# Patient Record
Sex: Female | Born: 1945
Health system: Southern US, Community
[De-identification: ages and names within clinical notes are randomized; demographics above are authoritative.]

## PROBLEM LIST (undated history)

## (undated) DIAGNOSIS — F32A Depression, unspecified: Secondary | ICD-10-CM

## (undated) DIAGNOSIS — E785 Hyperlipidemia, unspecified: Secondary | ICD-10-CM

## (undated) DIAGNOSIS — C4491 Basal cell carcinoma of skin, unspecified: Secondary | ICD-10-CM

## (undated) DIAGNOSIS — E119 Type 2 diabetes mellitus without complications: Secondary | ICD-10-CM

## (undated) DIAGNOSIS — F329 Major depressive disorder, single episode, unspecified: Secondary | ICD-10-CM

## (undated) DIAGNOSIS — C4492 Squamous cell carcinoma of skin, unspecified: Secondary | ICD-10-CM

## (undated) DIAGNOSIS — C439 Malignant melanoma of skin, unspecified: Secondary | ICD-10-CM

## (undated) DIAGNOSIS — C801 Malignant (primary) neoplasm, unspecified: Secondary | ICD-10-CM

## (undated) DIAGNOSIS — F419 Anxiety disorder, unspecified: Secondary | ICD-10-CM

## (undated) DIAGNOSIS — I10 Essential (primary) hypertension: Secondary | ICD-10-CM

## (undated) HISTORY — PX: MELANOMA EXCISION: SHX5266

## (undated) HISTORY — PX: BREAST BIOPSY: SHX20

## (undated) HISTORY — PX: APPENDECTOMY: SHX54

## (undated) HISTORY — DX: Major depressive disorder, single episode, unspecified: F32.9

## (undated) HISTORY — DX: Basal cell carcinoma of skin, unspecified: C44.91

## (undated) HISTORY — DX: Anxiety disorder, unspecified: F41.9

## (undated) HISTORY — DX: Type 2 diabetes mellitus without complications: E11.9

## (undated) HISTORY — DX: Essential (primary) hypertension: I10

## (undated) HISTORY — DX: Depression, unspecified: F32.A

## (undated) HISTORY — PX: BASAL CELL CARCINOMA EXCISION: SHX1214

## (undated) HISTORY — DX: Malignant melanoma of skin, unspecified: C43.9

## (undated) HISTORY — PX: ABDOMINAL HYSTERECTOMY: SHX81

## (undated) HISTORY — DX: Squamous cell carcinoma of skin, unspecified: C44.92

## (undated) HISTORY — DX: Hyperlipidemia, unspecified: E78.5

---

## 1974-10-11 HISTORY — PX: PARTIAL HYSTERECTOMY: SHX80

## 1978-10-11 HISTORY — PX: BREAST BIOPSY: SHX20

## 1991-10-12 DIAGNOSIS — I219 Acute myocardial infarction, unspecified: Secondary | ICD-10-CM

## 1991-10-12 HISTORY — DX: Acute myocardial infarction, unspecified: I21.9

## 1994-10-11 HISTORY — PX: CORONARY ARTERY BYPASS GRAFT: SHX141

## 2006-05-25 ENCOUNTER — Emergency Department: Payer: Self-pay | Admitting: Emergency Medicine

## 2007-07-11 ENCOUNTER — Ambulatory Visit: Payer: Self-pay | Admitting: Internal Medicine

## 2007-09-16 ENCOUNTER — Ambulatory Visit: Payer: Self-pay | Admitting: Internal Medicine

## 2007-09-16 ENCOUNTER — Emergency Department: Payer: Self-pay | Admitting: Emergency Medicine

## 2007-09-21 ENCOUNTER — Ambulatory Visit: Payer: Self-pay | Admitting: Internal Medicine

## 2007-09-23 ENCOUNTER — Emergency Department: Payer: Self-pay | Admitting: Emergency Medicine

## 2008-01-12 DIAGNOSIS — C4491 Basal cell carcinoma of skin, unspecified: Secondary | ICD-10-CM

## 2008-01-12 HISTORY — DX: Basal cell carcinoma of skin, unspecified: C44.91

## 2011-12-10 DIAGNOSIS — E042 Nontoxic multinodular goiter: Secondary | ICD-10-CM | POA: Diagnosis not present

## 2011-12-10 DIAGNOSIS — E119 Type 2 diabetes mellitus without complications: Secondary | ICD-10-CM | POA: Diagnosis not present

## 2011-12-10 DIAGNOSIS — I10 Essential (primary) hypertension: Secondary | ICD-10-CM | POA: Diagnosis not present

## 2011-12-10 DIAGNOSIS — E0520 Thyrotoxicosis with toxic multinodular goiter without thyrotoxic crisis or storm: Secondary | ICD-10-CM | POA: Diagnosis not present

## 2011-12-13 DIAGNOSIS — C801 Malignant (primary) neoplasm, unspecified: Secondary | ICD-10-CM

## 2011-12-13 DIAGNOSIS — D485 Neoplasm of uncertain behavior of skin: Secondary | ICD-10-CM | POA: Diagnosis not present

## 2011-12-13 DIAGNOSIS — L820 Inflamed seborrheic keratosis: Secondary | ICD-10-CM | POA: Diagnosis not present

## 2011-12-13 DIAGNOSIS — L821 Other seborrheic keratosis: Secondary | ICD-10-CM | POA: Diagnosis not present

## 2011-12-13 DIAGNOSIS — D239 Other benign neoplasm of skin, unspecified: Secondary | ICD-10-CM | POA: Diagnosis not present

## 2011-12-13 DIAGNOSIS — C439 Malignant melanoma of skin, unspecified: Secondary | ICD-10-CM

## 2011-12-13 DIAGNOSIS — Z719 Counseling, unspecified: Secondary | ICD-10-CM | POA: Diagnosis not present

## 2011-12-13 HISTORY — DX: Malignant (primary) neoplasm, unspecified: C80.1

## 2011-12-13 HISTORY — DX: Malignant melanoma of skin, unspecified: C43.9

## 2011-12-20 DIAGNOSIS — C44390 Other specified malignant neoplasm of skin of unspecified parts of face: Secondary | ICD-10-CM | POA: Diagnosis not present

## 2011-12-27 DIAGNOSIS — C4310 Malignant melanoma of unspecified eyelid, including canthus: Secondary | ICD-10-CM | POA: Diagnosis not present

## 2011-12-28 DIAGNOSIS — C4310 Malignant melanoma of unspecified eyelid, including canthus: Secondary | ICD-10-CM | POA: Diagnosis not present

## 2012-01-04 DIAGNOSIS — E119 Type 2 diabetes mellitus without complications: Secondary | ICD-10-CM | POA: Diagnosis not present

## 2012-01-04 DIAGNOSIS — I2510 Atherosclerotic heart disease of native coronary artery without angina pectoris: Secondary | ICD-10-CM | POA: Diagnosis not present

## 2012-01-04 DIAGNOSIS — Z01810 Encounter for preprocedural cardiovascular examination: Secondary | ICD-10-CM | POA: Diagnosis not present

## 2012-01-04 DIAGNOSIS — I10 Essential (primary) hypertension: Secondary | ICD-10-CM | POA: Diagnosis not present

## 2012-01-04 DIAGNOSIS — Z01818 Encounter for other preprocedural examination: Secondary | ICD-10-CM | POA: Diagnosis not present

## 2012-01-04 DIAGNOSIS — C4330 Malignant melanoma of unspecified part of face: Secondary | ICD-10-CM | POA: Diagnosis not present

## 2012-01-10 DIAGNOSIS — K219 Gastro-esophageal reflux disease without esophagitis: Secondary | ICD-10-CM | POA: Diagnosis not present

## 2012-01-10 DIAGNOSIS — E0590 Thyrotoxicosis, unspecified without thyrotoxic crisis or storm: Secondary | ICD-10-CM | POA: Diagnosis not present

## 2012-01-10 DIAGNOSIS — E785 Hyperlipidemia, unspecified: Secondary | ICD-10-CM | POA: Diagnosis not present

## 2012-01-10 DIAGNOSIS — Z950 Presence of cardiac pacemaker: Secondary | ICD-10-CM | POA: Diagnosis not present

## 2012-01-10 DIAGNOSIS — I252 Old myocardial infarction: Secondary | ICD-10-CM | POA: Diagnosis not present

## 2012-01-10 DIAGNOSIS — I10 Essential (primary) hypertension: Secondary | ICD-10-CM | POA: Diagnosis not present

## 2012-01-10 DIAGNOSIS — F3289 Other specified depressive episodes: Secondary | ICD-10-CM | POA: Diagnosis not present

## 2012-01-10 DIAGNOSIS — Z7982 Long term (current) use of aspirin: Secondary | ICD-10-CM | POA: Diagnosis not present

## 2012-01-10 DIAGNOSIS — C4310 Malignant melanoma of unspecified eyelid, including canthus: Secondary | ICD-10-CM | POA: Diagnosis not present

## 2012-01-10 DIAGNOSIS — C4330 Malignant melanoma of unspecified part of face: Secondary | ICD-10-CM | POA: Diagnosis not present

## 2012-01-10 DIAGNOSIS — E119 Type 2 diabetes mellitus without complications: Secondary | ICD-10-CM | POA: Diagnosis not present

## 2012-01-10 DIAGNOSIS — Z79899 Other long term (current) drug therapy: Secondary | ICD-10-CM | POA: Diagnosis not present

## 2012-01-11 DIAGNOSIS — C4310 Malignant melanoma of unspecified eyelid, including canthus: Secondary | ICD-10-CM | POA: Diagnosis not present

## 2012-01-20 DIAGNOSIS — C4310 Malignant melanoma of unspecified eyelid, including canthus: Secondary | ICD-10-CM | POA: Diagnosis not present

## 2012-01-31 DIAGNOSIS — C4310 Malignant melanoma of unspecified eyelid, including canthus: Secondary | ICD-10-CM | POA: Diagnosis not present

## 2012-01-31 DIAGNOSIS — E119 Type 2 diabetes mellitus without complications: Secondary | ICD-10-CM | POA: Diagnosis not present

## 2012-01-31 DIAGNOSIS — E785 Hyperlipidemia, unspecified: Secondary | ICD-10-CM | POA: Diagnosis not present

## 2012-01-31 DIAGNOSIS — I10 Essential (primary) hypertension: Secondary | ICD-10-CM | POA: Diagnosis not present

## 2012-01-31 DIAGNOSIS — I2510 Atherosclerotic heart disease of native coronary artery without angina pectoris: Secondary | ICD-10-CM | POA: Diagnosis not present

## 2012-02-01 DIAGNOSIS — C4491 Basal cell carcinoma of skin, unspecified: Secondary | ICD-10-CM | POA: Diagnosis not present

## 2012-02-09 DIAGNOSIS — C44390 Other specified malignant neoplasm of skin of unspecified parts of face: Secondary | ICD-10-CM | POA: Diagnosis not present

## 2012-02-10 DIAGNOSIS — M952 Other acquired deformity of head: Secondary | ICD-10-CM | POA: Diagnosis not present

## 2012-02-10 DIAGNOSIS — Z951 Presence of aortocoronary bypass graft: Secondary | ICD-10-CM | POA: Diagnosis not present

## 2012-02-10 DIAGNOSIS — C4310 Malignant melanoma of unspecified eyelid, including canthus: Secondary | ICD-10-CM | POA: Diagnosis not present

## 2012-02-10 DIAGNOSIS — Z9889 Other specified postprocedural states: Secondary | ICD-10-CM | POA: Diagnosis not present

## 2012-02-10 DIAGNOSIS — E119 Type 2 diabetes mellitus without complications: Secondary | ICD-10-CM | POA: Diagnosis not present

## 2012-02-10 DIAGNOSIS — F329 Major depressive disorder, single episode, unspecified: Secondary | ICD-10-CM | POA: Diagnosis not present

## 2012-02-10 DIAGNOSIS — C4330 Malignant melanoma of unspecified part of face: Secondary | ICD-10-CM | POA: Diagnosis not present

## 2012-02-10 DIAGNOSIS — C44111 Basal cell carcinoma of skin of unspecified eyelid, including canthus: Secondary | ICD-10-CM | POA: Diagnosis not present

## 2012-02-10 DIAGNOSIS — I2510 Atherosclerotic heart disease of native coronary artery without angina pectoris: Secondary | ICD-10-CM | POA: Diagnosis not present

## 2012-02-10 DIAGNOSIS — I10 Essential (primary) hypertension: Secondary | ICD-10-CM | POA: Diagnosis not present

## 2012-02-10 DIAGNOSIS — E785 Hyperlipidemia, unspecified: Secondary | ICD-10-CM | POA: Diagnosis not present

## 2012-03-10 DIAGNOSIS — E1139 Type 2 diabetes mellitus with other diabetic ophthalmic complication: Secondary | ICD-10-CM | POA: Diagnosis not present

## 2012-03-10 DIAGNOSIS — E11329 Type 2 diabetes mellitus with mild nonproliferative diabetic retinopathy without macular edema: Secondary | ICD-10-CM | POA: Diagnosis not present

## 2012-03-10 DIAGNOSIS — H2510 Age-related nuclear cataract, unspecified eye: Secondary | ICD-10-CM | POA: Diagnosis not present

## 2012-03-10 DIAGNOSIS — E119 Type 2 diabetes mellitus without complications: Secondary | ICD-10-CM | POA: Diagnosis not present

## 2012-03-20 DIAGNOSIS — Z86010 Personal history of colonic polyps: Secondary | ICD-10-CM | POA: Diagnosis not present

## 2012-03-20 DIAGNOSIS — D126 Benign neoplasm of colon, unspecified: Secondary | ICD-10-CM | POA: Diagnosis not present

## 2012-03-20 DIAGNOSIS — Z1211 Encounter for screening for malignant neoplasm of colon: Secondary | ICD-10-CM | POA: Diagnosis not present

## 2012-03-20 DIAGNOSIS — D128 Benign neoplasm of rectum: Secondary | ICD-10-CM | POA: Diagnosis not present

## 2012-05-22 DIAGNOSIS — L821 Other seborrheic keratosis: Secondary | ICD-10-CM | POA: Diagnosis not present

## 2012-05-22 DIAGNOSIS — D1801 Hemangioma of skin and subcutaneous tissue: Secondary | ICD-10-CM | POA: Diagnosis not present

## 2012-05-22 DIAGNOSIS — D2370 Other benign neoplasm of skin of unspecified lower limb, including hip: Secondary | ICD-10-CM | POA: Diagnosis not present

## 2012-05-22 DIAGNOSIS — D485 Neoplasm of uncertain behavior of skin: Secondary | ICD-10-CM | POA: Diagnosis not present

## 2012-06-06 DIAGNOSIS — C4491 Basal cell carcinoma of skin, unspecified: Secondary | ICD-10-CM | POA: Diagnosis not present

## 2012-09-10 LAB — HM COLONOSCOPY

## 2013-01-03 ENCOUNTER — Ambulatory Visit: Payer: Self-pay | Admitting: Internal Medicine

## 2013-01-03 DIAGNOSIS — J329 Chronic sinusitis, unspecified: Secondary | ICD-10-CM | POA: Diagnosis not present

## 2013-01-09 DIAGNOSIS — E039 Hypothyroidism, unspecified: Secondary | ICD-10-CM | POA: Diagnosis not present

## 2013-01-09 DIAGNOSIS — E7800 Pure hypercholesterolemia, unspecified: Secondary | ICD-10-CM | POA: Diagnosis not present

## 2013-01-09 DIAGNOSIS — Z79899 Other long term (current) drug therapy: Secondary | ICD-10-CM | POA: Diagnosis not present

## 2013-01-09 DIAGNOSIS — I10 Essential (primary) hypertension: Secondary | ICD-10-CM | POA: Diagnosis not present

## 2013-01-09 DIAGNOSIS — E119 Type 2 diabetes mellitus without complications: Secondary | ICD-10-CM | POA: Diagnosis not present

## 2013-01-09 DIAGNOSIS — C439 Malignant melanoma of skin, unspecified: Secondary | ICD-10-CM | POA: Diagnosis not present

## 2013-01-09 DIAGNOSIS — Z794 Long term (current) use of insulin: Secondary | ICD-10-CM | POA: Diagnosis not present

## 2013-01-12 DIAGNOSIS — F4321 Adjustment disorder with depressed mood: Secondary | ICD-10-CM | POA: Diagnosis not present

## 2013-01-18 DIAGNOSIS — F4321 Adjustment disorder with depressed mood: Secondary | ICD-10-CM | POA: Diagnosis not present

## 2013-01-24 DIAGNOSIS — F4321 Adjustment disorder with depressed mood: Secondary | ICD-10-CM | POA: Diagnosis not present

## 2013-01-31 DIAGNOSIS — F4321 Adjustment disorder with depressed mood: Secondary | ICD-10-CM | POA: Diagnosis not present

## 2013-02-07 DIAGNOSIS — E119 Type 2 diabetes mellitus without complications: Secondary | ICD-10-CM | POA: Diagnosis not present

## 2013-02-07 DIAGNOSIS — E039 Hypothyroidism, unspecified: Secondary | ICD-10-CM | POA: Diagnosis not present

## 2013-02-07 DIAGNOSIS — F4321 Adjustment disorder with depressed mood: Secondary | ICD-10-CM | POA: Diagnosis not present

## 2013-02-07 DIAGNOSIS — I10 Essential (primary) hypertension: Secondary | ICD-10-CM | POA: Diagnosis not present

## 2013-02-07 DIAGNOSIS — I498 Other specified cardiac arrhythmias: Secondary | ICD-10-CM | POA: Diagnosis not present

## 2013-02-28 DIAGNOSIS — F4321 Adjustment disorder with depressed mood: Secondary | ICD-10-CM | POA: Diagnosis not present

## 2013-03-07 DIAGNOSIS — F4321 Adjustment disorder with depressed mood: Secondary | ICD-10-CM | POA: Diagnosis not present

## 2013-03-07 DIAGNOSIS — M545 Low back pain: Secondary | ICD-10-CM | POA: Diagnosis not present

## 2013-03-07 DIAGNOSIS — I10 Essential (primary) hypertension: Secondary | ICD-10-CM | POA: Diagnosis not present

## 2013-03-07 DIAGNOSIS — M533 Sacrococcygeal disorders, not elsewhere classified: Secondary | ICD-10-CM | POA: Diagnosis not present

## 2013-03-07 DIAGNOSIS — I25810 Atherosclerosis of coronary artery bypass graft(s) without angina pectoris: Secondary | ICD-10-CM | POA: Diagnosis not present

## 2013-03-14 DIAGNOSIS — F4321 Adjustment disorder with depressed mood: Secondary | ICD-10-CM | POA: Diagnosis not present

## 2013-03-21 DIAGNOSIS — F4321 Adjustment disorder with depressed mood: Secondary | ICD-10-CM | POA: Diagnosis not present

## 2013-04-04 DIAGNOSIS — R0602 Shortness of breath: Secondary | ICD-10-CM | POA: Diagnosis not present

## 2013-04-04 DIAGNOSIS — E782 Mixed hyperlipidemia: Secondary | ICD-10-CM | POA: Diagnosis not present

## 2013-04-04 DIAGNOSIS — I252 Old myocardial infarction: Secondary | ICD-10-CM | POA: Diagnosis not present

## 2013-04-04 DIAGNOSIS — I209 Angina pectoris, unspecified: Secondary | ICD-10-CM | POA: Diagnosis not present

## 2013-04-05 DIAGNOSIS — F4321 Adjustment disorder with depressed mood: Secondary | ICD-10-CM | POA: Diagnosis not present

## 2013-04-18 DIAGNOSIS — F4321 Adjustment disorder with depressed mood: Secondary | ICD-10-CM | POA: Diagnosis not present

## 2013-04-20 DIAGNOSIS — R0602 Shortness of breath: Secondary | ICD-10-CM | POA: Diagnosis not present

## 2013-04-20 DIAGNOSIS — I6529 Occlusion and stenosis of unspecified carotid artery: Secondary | ICD-10-CM | POA: Diagnosis not present

## 2013-04-20 DIAGNOSIS — I25810 Atherosclerosis of coronary artery bypass graft(s) without angina pectoris: Secondary | ICD-10-CM | POA: Diagnosis not present

## 2013-04-20 DIAGNOSIS — I209 Angina pectoris, unspecified: Secondary | ICD-10-CM | POA: Diagnosis not present

## 2013-04-23 DIAGNOSIS — I25810 Atherosclerosis of coronary artery bypass graft(s) without angina pectoris: Secondary | ICD-10-CM | POA: Diagnosis not present

## 2013-04-23 DIAGNOSIS — Z85828 Personal history of other malignant neoplasm of skin: Secondary | ICD-10-CM | POA: Diagnosis not present

## 2013-04-23 DIAGNOSIS — L570 Actinic keratosis: Secondary | ICD-10-CM | POA: Diagnosis not present

## 2013-04-23 DIAGNOSIS — Z1283 Encounter for screening for malignant neoplasm of skin: Secondary | ICD-10-CM | POA: Diagnosis not present

## 2013-04-23 DIAGNOSIS — M62838 Other muscle spasm: Secondary | ICD-10-CM | POA: Diagnosis not present

## 2013-04-23 DIAGNOSIS — D2370 Other benign neoplasm of skin of unspecified lower limb, including hip: Secondary | ICD-10-CM | POA: Diagnosis not present

## 2013-04-23 DIAGNOSIS — Z85820 Personal history of malignant melanoma of skin: Secondary | ICD-10-CM | POA: Diagnosis not present

## 2013-04-23 DIAGNOSIS — L821 Other seborrheic keratosis: Secondary | ICD-10-CM | POA: Diagnosis not present

## 2013-05-02 DIAGNOSIS — F4321 Adjustment disorder with depressed mood: Secondary | ICD-10-CM | POA: Diagnosis not present

## 2013-05-17 ENCOUNTER — Ambulatory Visit: Payer: Self-pay | Admitting: Internal Medicine

## 2013-05-17 DIAGNOSIS — R928 Other abnormal and inconclusive findings on diagnostic imaging of breast: Secondary | ICD-10-CM | POA: Diagnosis not present

## 2013-05-17 DIAGNOSIS — Z1231 Encounter for screening mammogram for malignant neoplasm of breast: Secondary | ICD-10-CM | POA: Diagnosis not present

## 2013-05-22 ENCOUNTER — Ambulatory Visit: Payer: Self-pay

## 2013-05-22 DIAGNOSIS — Z951 Presence of aortocoronary bypass graft: Secondary | ICD-10-CM | POA: Diagnosis not present

## 2013-05-22 DIAGNOSIS — S61409A Unspecified open wound of unspecified hand, initial encounter: Secondary | ICD-10-CM | POA: Diagnosis not present

## 2013-05-22 DIAGNOSIS — E119 Type 2 diabetes mellitus without complications: Secondary | ICD-10-CM | POA: Diagnosis not present

## 2013-05-22 DIAGNOSIS — I252 Old myocardial infarction: Secondary | ICD-10-CM | POA: Diagnosis not present

## 2013-08-28 DIAGNOSIS — J40 Bronchitis, not specified as acute or chronic: Secondary | ICD-10-CM | POA: Diagnosis not present

## 2013-08-28 DIAGNOSIS — F329 Major depressive disorder, single episode, unspecified: Secondary | ICD-10-CM | POA: Diagnosis not present

## 2013-08-28 DIAGNOSIS — I10 Essential (primary) hypertension: Secondary | ICD-10-CM | POA: Diagnosis not present

## 2013-08-28 DIAGNOSIS — E119 Type 2 diabetes mellitus without complications: Secondary | ICD-10-CM | POA: Diagnosis not present

## 2013-10-29 DIAGNOSIS — Z1283 Encounter for screening for malignant neoplasm of skin: Secondary | ICD-10-CM | POA: Diagnosis not present

## 2013-10-29 DIAGNOSIS — L570 Actinic keratosis: Secondary | ICD-10-CM | POA: Diagnosis not present

## 2013-10-29 DIAGNOSIS — Z85820 Personal history of malignant melanoma of skin: Secondary | ICD-10-CM | POA: Diagnosis not present

## 2013-10-29 DIAGNOSIS — D2370 Other benign neoplasm of skin of unspecified lower limb, including hip: Secondary | ICD-10-CM | POA: Diagnosis not present

## 2013-12-16 IMAGING — MG MM DIGITAL SCREENING BILAT W/ CAD
1 series · 4 of 4 positions shown · non-contrast
Comparison: none

REASON FOR EXAM: SCR MAMMO NO ORDER
COMMENTS:

PROCEDURE:     MMM - MMM DGT SCR NO ORDER W/CAD  - May 17, 2013  [DATE]
RESULT:     Comparison made to prior exams dating to 09/08/2009. No mass.
Benign calcifications. CAD evaluation nonfocal.

[R CC · right · 4 of 4 slices shown]
[im 1/4]
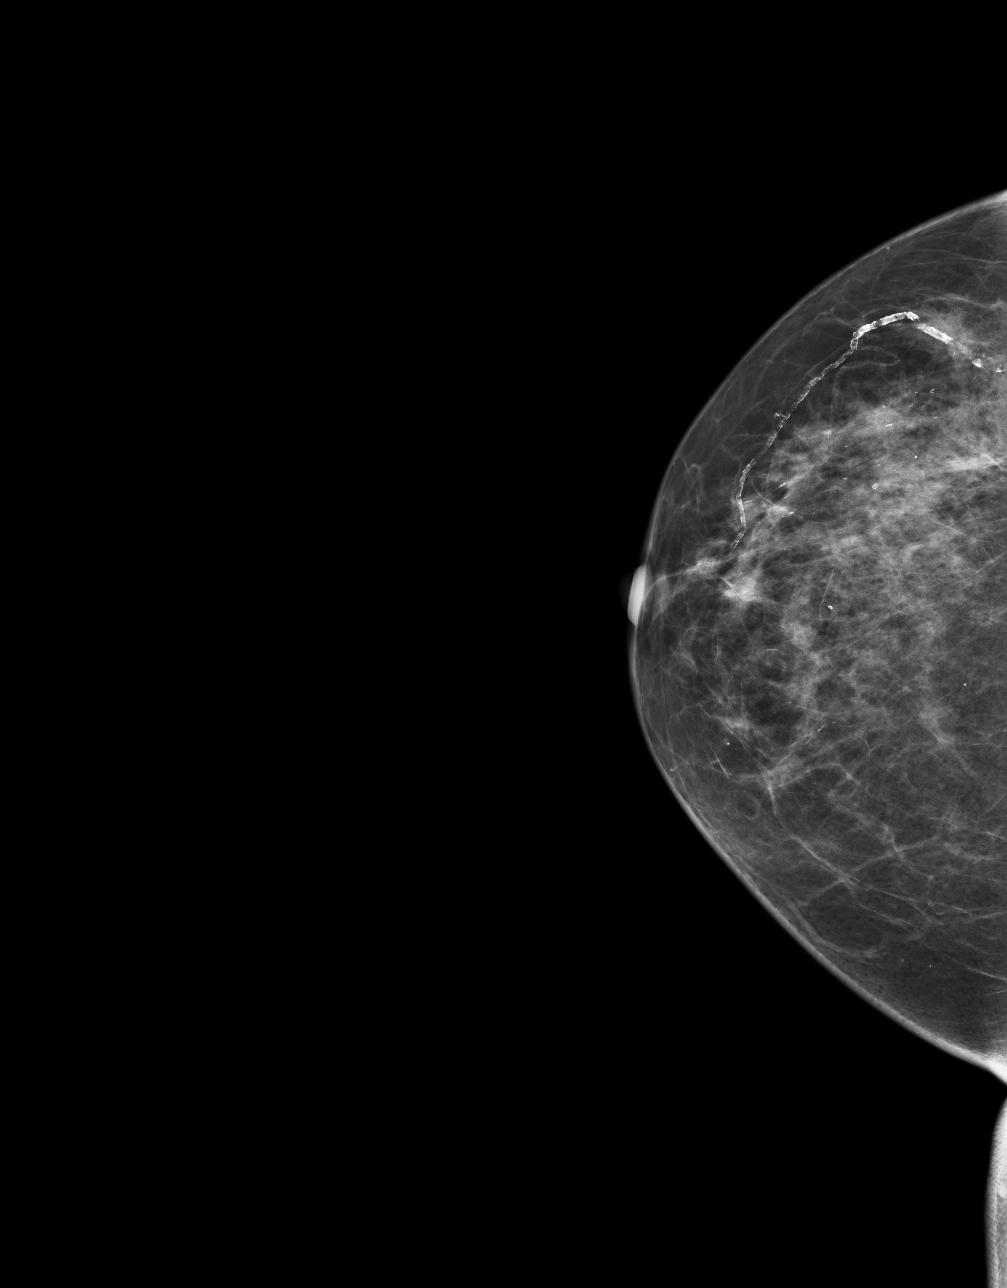
[im 2/4]
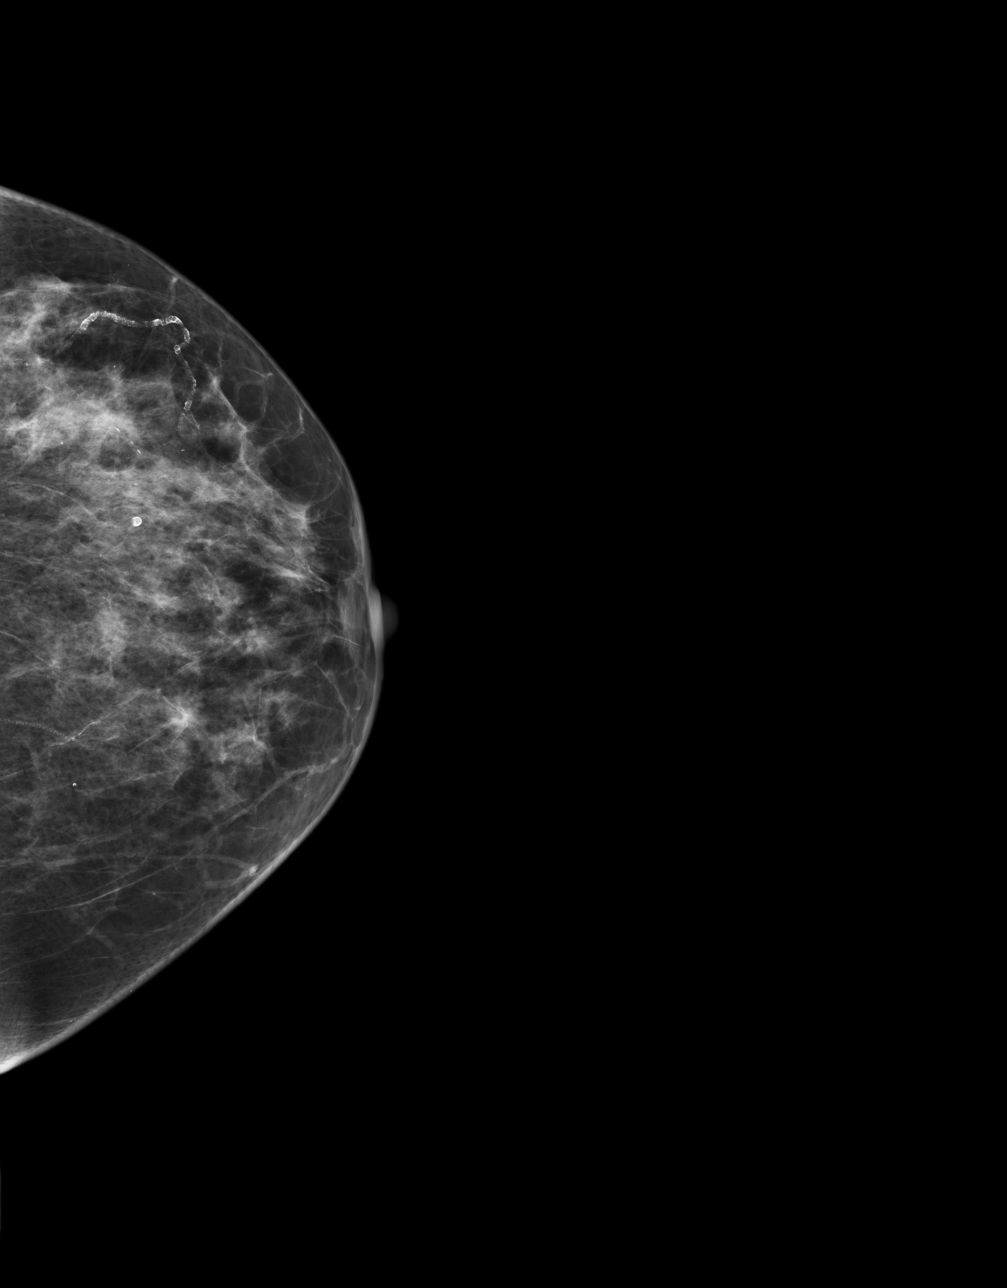
[im 3/4]
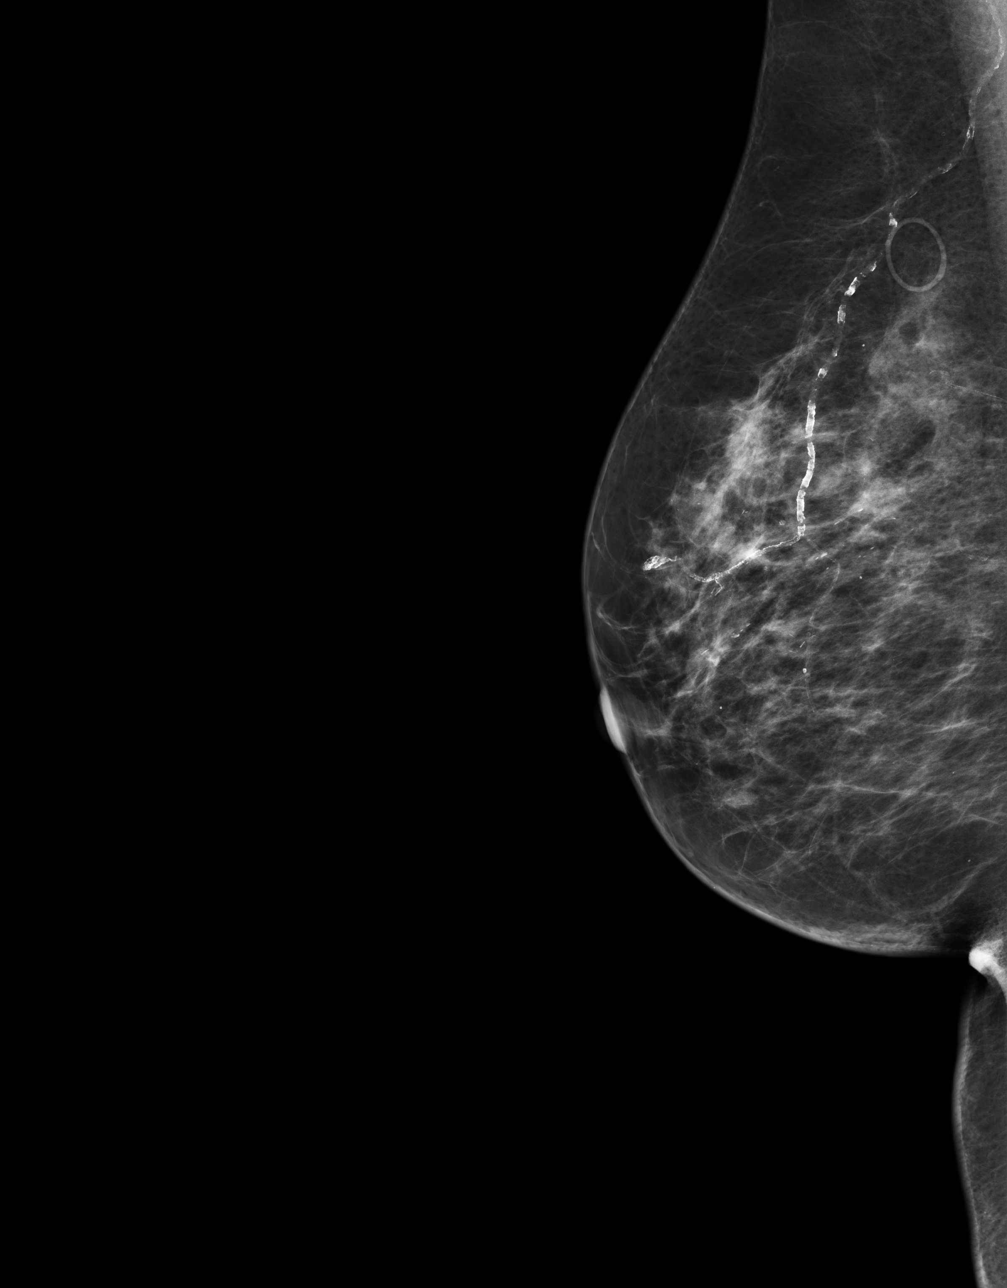
[im 4/4]
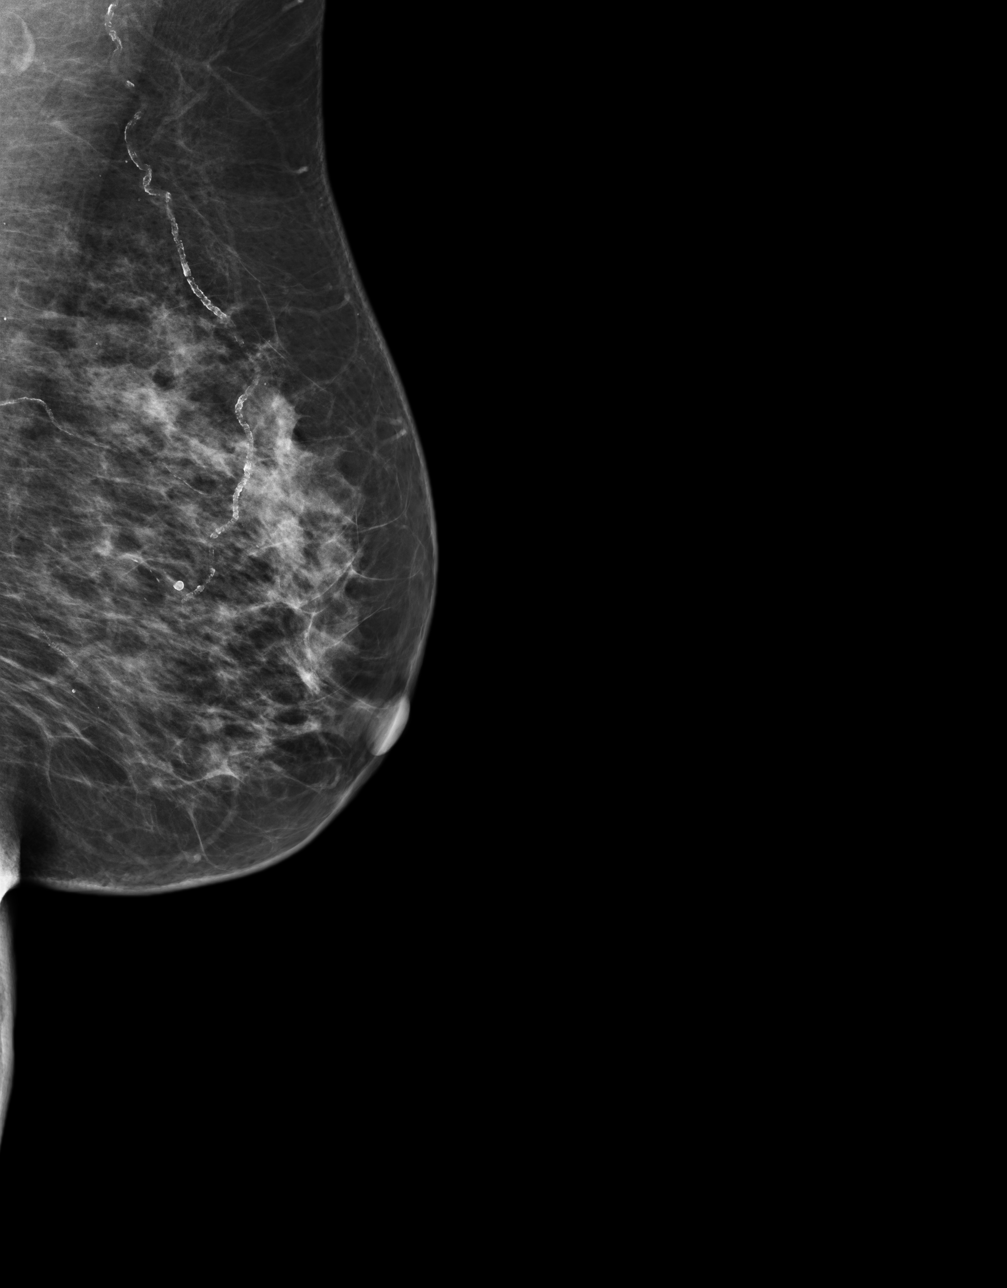

[4 of 4 positions shown; findings below may reference images not displayed]

IMPRESSION: Benign exam. Yearly followup exam suggested.

BI-RADS: Category 2- Benign Finding

A NEGATIVE MAMMOGRAM REPORT DOES NOT PRECLUDE BIOPSY OR OTHER EVALUATION OF
A CLINICALLY PALPABLE OR OTHERWISE SUSPICIOUS MASS OR LESION. BREAST CANCER
MAY NOT BE DETECTED IN UP TO 10% OF CASES.

Thank you for the oppurtunity to contribute to the care of your patient. .
BREAST COMPOSITION: The breast composition is HETEROGENEOUSLY DENSE
(glandular tissue is 51-75%) This may decrease the sensitivity of
mammography.

## 2014-02-06 DIAGNOSIS — I495 Sick sinus syndrome: Secondary | ICD-10-CM | POA: Diagnosis not present

## 2014-02-06 DIAGNOSIS — E039 Hypothyroidism, unspecified: Secondary | ICD-10-CM | POA: Diagnosis not present

## 2014-02-06 DIAGNOSIS — I498 Other specified cardiac arrhythmias: Secondary | ICD-10-CM | POA: Diagnosis not present

## 2014-02-06 DIAGNOSIS — I10 Essential (primary) hypertension: Secondary | ICD-10-CM | POA: Diagnosis not present

## 2014-02-06 DIAGNOSIS — I2510 Atherosclerotic heart disease of native coronary artery without angina pectoris: Secondary | ICD-10-CM | POA: Diagnosis not present

## 2014-02-06 DIAGNOSIS — R002 Palpitations: Secondary | ICD-10-CM | POA: Diagnosis not present

## 2014-02-06 DIAGNOSIS — E119 Type 2 diabetes mellitus without complications: Secondary | ICD-10-CM | POA: Diagnosis not present

## 2014-02-06 DIAGNOSIS — I491 Atrial premature depolarization: Secondary | ICD-10-CM | POA: Diagnosis not present

## 2014-02-08 DIAGNOSIS — E890 Postprocedural hypothyroidism: Secondary | ICD-10-CM

## 2014-02-08 HISTORY — DX: Postprocedural hypothyroidism: E89.0

## 2014-02-11 DIAGNOSIS — I4949 Other premature depolarization: Secondary | ICD-10-CM | POA: Diagnosis not present

## 2014-02-11 DIAGNOSIS — R002 Palpitations: Secondary | ICD-10-CM | POA: Diagnosis not present

## 2014-02-11 DIAGNOSIS — E782 Mixed hyperlipidemia: Secondary | ICD-10-CM | POA: Diagnosis not present

## 2014-02-11 DIAGNOSIS — E119 Type 2 diabetes mellitus without complications: Secondary | ICD-10-CM | POA: Diagnosis not present

## 2014-03-12 DIAGNOSIS — I10 Essential (primary) hypertension: Secondary | ICD-10-CM | POA: Diagnosis not present

## 2014-03-12 DIAGNOSIS — Z0000 Encounter for general adult medical examination without abnormal findings: Secondary | ICD-10-CM | POA: Diagnosis not present

## 2014-03-12 DIAGNOSIS — E890 Postprocedural hypothyroidism: Secondary | ICD-10-CM | POA: Diagnosis not present

## 2014-03-12 DIAGNOSIS — E119 Type 2 diabetes mellitus without complications: Secondary | ICD-10-CM | POA: Diagnosis not present

## 2014-03-12 DIAGNOSIS — E785 Hyperlipidemia, unspecified: Secondary | ICD-10-CM | POA: Diagnosis not present

## 2014-03-12 LAB — BASIC METABOLIC PANEL
BUN: 20 mg/dL (ref 4–21)
Creatinine: 1 mg/dL (ref <=1.1)

## 2014-03-12 LAB — CBC AND DIFFERENTIAL: Hemoglobin: 12.2 g/dL (ref 12.0–16.0)

## 2014-07-10 DIAGNOSIS — F324 Major depressive disorder, single episode, in partial remission: Secondary | ICD-10-CM

## 2014-07-10 DIAGNOSIS — F32A Depression, unspecified: Secondary | ICD-10-CM

## 2014-07-10 DIAGNOSIS — F419 Anxiety disorder, unspecified: Secondary | ICD-10-CM

## 2014-07-10 HISTORY — DX: Anxiety disorder, unspecified: F41.9

## 2014-07-10 HISTORY — DX: Major depressive disorder, single episode, in partial remission: F32.4

## 2014-07-10 HISTORY — DX: Depression, unspecified: F32.A

## 2014-08-21 DIAGNOSIS — E119 Type 2 diabetes mellitus without complications: Secondary | ICD-10-CM | POA: Diagnosis not present

## 2014-08-21 DIAGNOSIS — Z23 Encounter for immunization: Secondary | ICD-10-CM | POA: Diagnosis not present

## 2014-08-21 DIAGNOSIS — Z7982 Long term (current) use of aspirin: Secondary | ICD-10-CM | POA: Diagnosis not present

## 2014-08-21 DIAGNOSIS — Z79899 Other long term (current) drug therapy: Secondary | ICD-10-CM | POA: Diagnosis not present

## 2014-08-21 DIAGNOSIS — E039 Hypothyroidism, unspecified: Secondary | ICD-10-CM | POA: Diagnosis not present

## 2014-08-21 DIAGNOSIS — Z794 Long term (current) use of insulin: Secondary | ICD-10-CM | POA: Diagnosis not present

## 2014-08-21 DIAGNOSIS — E785 Hyperlipidemia, unspecified: Secondary | ICD-10-CM | POA: Diagnosis not present

## 2014-08-21 HISTORY — DX: Type 2 diabetes mellitus without complications: E11.9

## 2014-09-03 DIAGNOSIS — I491 Atrial premature depolarization: Secondary | ICD-10-CM | POA: Insufficient documentation

## 2014-09-03 DIAGNOSIS — I6529 Occlusion and stenosis of unspecified carotid artery: Secondary | ICD-10-CM | POA: Insufficient documentation

## 2014-09-03 DIAGNOSIS — I10 Essential (primary) hypertension: Secondary | ICD-10-CM | POA: Insufficient documentation

## 2014-09-03 HISTORY — DX: Essential (primary) hypertension: I10

## 2014-09-03 HISTORY — DX: Atrial premature depolarization: I49.1

## 2014-09-13 DIAGNOSIS — I6523 Occlusion and stenosis of bilateral carotid arteries: Secondary | ICD-10-CM | POA: Diagnosis not present

## 2014-09-13 DIAGNOSIS — E782 Mixed hyperlipidemia: Secondary | ICD-10-CM | POA: Diagnosis not present

## 2014-09-13 DIAGNOSIS — I25810 Atherosclerosis of coronary artery bypass graft(s) without angina pectoris: Secondary | ICD-10-CM | POA: Diagnosis not present

## 2014-09-13 DIAGNOSIS — I10 Essential (primary) hypertension: Secondary | ICD-10-CM | POA: Diagnosis not present

## 2014-09-24 DIAGNOSIS — I25810 Atherosclerosis of coronary artery bypass graft(s) without angina pectoris: Secondary | ICD-10-CM | POA: Diagnosis not present

## 2014-10-29 DIAGNOSIS — L570 Actinic keratosis: Secondary | ICD-10-CM | POA: Diagnosis not present

## 2014-10-29 DIAGNOSIS — Z1283 Encounter for screening for malignant neoplasm of skin: Secondary | ICD-10-CM | POA: Diagnosis not present

## 2014-10-29 DIAGNOSIS — Z85820 Personal history of malignant melanoma of skin: Secondary | ICD-10-CM | POA: Diagnosis not present

## 2014-10-29 DIAGNOSIS — Z08 Encounter for follow-up examination after completed treatment for malignant neoplasm: Secondary | ICD-10-CM | POA: Diagnosis not present

## 2014-10-29 DIAGNOSIS — W57XXXA Bitten or stung by nonvenomous insect and other nonvenomous arthropods, initial encounter: Secondary | ICD-10-CM

## 2014-10-29 DIAGNOSIS — D2339 Other benign neoplasm of skin of other parts of face: Secondary | ICD-10-CM | POA: Diagnosis not present

## 2014-10-29 DIAGNOSIS — L853 Xerosis cutis: Secondary | ICD-10-CM | POA: Diagnosis not present

## 2014-10-29 DIAGNOSIS — L821 Other seborrheic keratosis: Secondary | ICD-10-CM | POA: Diagnosis not present

## 2014-10-29 HISTORY — DX: Bitten or stung by nonvenomous insect and other nonvenomous arthropods, initial encounter: W57.XXXA

## 2014-11-06 ENCOUNTER — Ambulatory Visit: Payer: Self-pay | Admitting: Internal Medicine

## 2014-11-06 DIAGNOSIS — Z1231 Encounter for screening mammogram for malignant neoplasm of breast: Secondary | ICD-10-CM | POA: Diagnosis not present

## 2014-11-06 LAB — HM MAMMOGRAPHY: HM Mammogram: NORMAL

## 2014-11-27 DIAGNOSIS — Z794 Long term (current) use of insulin: Secondary | ICD-10-CM | POA: Diagnosis not present

## 2014-11-27 DIAGNOSIS — E119 Type 2 diabetes mellitus without complications: Secondary | ICD-10-CM | POA: Diagnosis not present

## 2014-11-27 DIAGNOSIS — E785 Hyperlipidemia, unspecified: Secondary | ICD-10-CM | POA: Diagnosis not present

## 2014-11-27 DIAGNOSIS — E039 Hypothyroidism, unspecified: Secondary | ICD-10-CM | POA: Diagnosis not present

## 2014-12-27 DIAGNOSIS — E113293 Type 2 diabetes mellitus with mild nonproliferative diabetic retinopathy without macular edema, bilateral: Secondary | ICD-10-CM

## 2014-12-27 DIAGNOSIS — E11329 Type 2 diabetes mellitus with mild nonproliferative diabetic retinopathy without macular edema: Secondary | ICD-10-CM | POA: Diagnosis not present

## 2014-12-27 HISTORY — DX: Type 2 diabetes mellitus with mild nonproliferative diabetic retinopathy without macular edema, bilateral: E11.3293

## 2015-02-14 DIAGNOSIS — H40003 Preglaucoma, unspecified, bilateral: Secondary | ICD-10-CM | POA: Diagnosis not present

## 2015-06-12 ENCOUNTER — Other Ambulatory Visit: Payer: Self-pay | Admitting: Internal Medicine

## 2015-08-08 ENCOUNTER — Other Ambulatory Visit: Payer: Self-pay

## 2015-08-08 ENCOUNTER — Encounter: Payer: Self-pay | Admitting: Internal Medicine

## 2015-08-08 DIAGNOSIS — I2510 Atherosclerotic heart disease of native coronary artery without angina pectoris: Secondary | ICD-10-CM | POA: Insufficient documentation

## 2015-08-08 DIAGNOSIS — Z860100 Personal history of colon polyps, unspecified: Secondary | ICD-10-CM

## 2015-08-08 DIAGNOSIS — E890 Postprocedural hypothyroidism: Secondary | ICD-10-CM | POA: Insufficient documentation

## 2015-08-08 DIAGNOSIS — J40 Bronchitis, not specified as acute or chronic: Secondary | ICD-10-CM | POA: Insufficient documentation

## 2015-08-08 DIAGNOSIS — E113293 Type 2 diabetes mellitus with mild nonproliferative diabetic retinopathy without macular edema, bilateral: Secondary | ICD-10-CM | POA: Insufficient documentation

## 2015-08-08 DIAGNOSIS — Z9889 Other specified postprocedural states: Secondary | ICD-10-CM | POA: Insufficient documentation

## 2015-08-08 DIAGNOSIS — I10 Essential (primary) hypertension: Secondary | ICD-10-CM | POA: Insufficient documentation

## 2015-08-08 DIAGNOSIS — E785 Hyperlipidemia, unspecified: Secondary | ICD-10-CM | POA: Insufficient documentation

## 2015-08-08 DIAGNOSIS — M549 Dorsalgia, unspecified: Secondary | ICD-10-CM | POA: Insufficient documentation

## 2015-08-08 DIAGNOSIS — F324 Major depressive disorder, single episode, in partial remission: Secondary | ICD-10-CM | POA: Insufficient documentation

## 2015-08-08 HISTORY — DX: Personal history of colon polyps, unspecified: Z86.0100

## 2015-08-11 ENCOUNTER — Other Ambulatory Visit: Payer: Self-pay | Admitting: Internal Medicine

## 2015-08-11 ENCOUNTER — Encounter: Payer: Self-pay | Admitting: Internal Medicine

## 2015-08-11 ENCOUNTER — Ambulatory Visit (INDEPENDENT_AMBULATORY_CARE_PROVIDER_SITE_OTHER): Payer: Medicare Other | Admitting: Internal Medicine

## 2015-08-11 VITALS — BP 104/64 | HR 80 | Ht 66.0 in | Wt 190.6 lb

## 2015-08-11 DIAGNOSIS — Z85820 Personal history of malignant melanoma of skin: Secondary | ICD-10-CM | POA: Insufficient documentation

## 2015-08-11 DIAGNOSIS — N3000 Acute cystitis without hematuria: Secondary | ICD-10-CM | POA: Diagnosis not present

## 2015-08-11 DIAGNOSIS — N760 Acute vaginitis: Secondary | ICD-10-CM

## 2015-08-11 DIAGNOSIS — C4491 Basal cell carcinoma of skin, unspecified: Secondary | ICD-10-CM | POA: Insufficient documentation

## 2015-08-11 LAB — POC URINALYSIS WITH MICROSCOPIC (NON AUTO)MANUAL RESULT
BILIRUBIN UA: 0
Blood, UA: NEGATIVE
Crystals: 0
EPITHELIAL CELLS, URINE PER MICROSCOPY: 2
GLUCOSE UA: NEGATIVE
KETONES UA: NEGATIVE
Mucus, UA: 0
NITRITE UA: NEGATIVE
Protein, UA: NEGATIVE
RBC: 0 M/uL — AB (ref 4.04–5.48)
Spec Grav, UA: 1.02
UROBILINOGEN UA: 0.2
WBC Casts, UA: 5
pH, UA: 5

## 2015-08-11 MED ORDER — FLUCONAZOLE 100 MG PO TABS: 100.0000 mg | ORAL_TABLET | Freq: Every day | ORAL | Status: DC

## 2015-08-11 MED ORDER — CIPROFLOXACIN HCL 250 MG PO TABS: 250.0000 mg | ORAL_TABLET | Freq: Two times a day (BID) | ORAL | Status: DC

## 2015-08-11 NOTE — Progress Notes (Signed)
Date:  08/11/2015   Name:  Angela Rose   DOB:  1946-02-27   MRN:  [id number]   Chief Complaint: Urinary Tract Infection Urinary Tract Infection  This is a new problem. The current episode started in the past 7 days. The problem has been gradually worsening. There has been no fever. Associated symptoms include a discharge, frequency and urgency. Pertinent negatives include no chills. She has tried increased fluids for the symptoms. on a new study drug for DM - may be an SGLT-2   Patient also complains of vaginitis. She denies any discharge but she does have itching. It is not severe. There's been no vaginal bleeding. She denies recent course of antibiotics.   Review of Systems  Constitutional: Negative for fever, chills and diaphoresis.  Gastrointestinal: Negative for abdominal pain, diarrhea and constipation.  Genitourinary: Positive for urgency and frequency. Negative for vaginal bleeding and vaginal discharge.       Vaginal itching   Hematological: Negative for adenopathy.    Patient Active Problem List   Diagnosis Date Noted  . Basal cell carcinoma 08/11/2015  . Malignant melanoma ([place]) 08/11/2015  . Back ache 08/08/2015  . Bronchitis 08/08/2015  . 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 Clinical depression 08/08/2015  . Diabetes mellitus ([place]) 08/08/2015  . History of colon polyps 08/08/2015  . History of surgical procedure 08/08/2015  . HLD (hyperlipidemia) 08/08/2015  . Hypertension 08/08/2015  . Hypothyroidism, postablative 08/08/2015  . Carotid artery plaque 09/03/2014  . Essential (primary) hypertension 09/03/2014  . APC (atrial premature contractions) 09/03/2014    Prior to Admission medications   Medication Sig Start Date End Date Taking? Authorizing Provider  amLODipine (NORVASC) 2.5 MG tablet Take by mouth. 03/12/14  Yes Historical Provider, MD  aspirin 81 MG tablet Take by mouth.   Yes Historical Provider, MD  enalapril (VASOTEC)  5 MG tablet TAKE ONE TABLET BY MOUTH TWICE DAILY 06/12/15  Yes Glean Hess, MD  FLUoxetine (PROZAC) 40 MG capsule Take by mouth. 07/10/14  Yes Historical Provider, MD  glimepiride (AMARYL) 4 MG tablet Take 1 tablet by mouth 2 (two) times daily. 07/29/15  Yes Historical Provider, MD  glucose blood (ONE TOUCH ULTRA TEST) test strip Frequency:PHARMDIR   Dosage:0.0     Instructions:  Note:testing 3-4xqd, DX Code 250.00 Dose: 1 12/24/08  Yes Historical Provider, MD  hydrochlorothiazide (HYDRODIURIL) 25 MG tablet Take by mouth. 03/12/14  Yes Historical Provider, MD  Insulin Glargine (LANTUS SOLOSTAR) 100 UNIT/ML Solostar Pen Inject into the skin.   Yes Historical Provider, MD  levothyroxine (SYNTHROID, LEVOTHROID) 25 MCG tablet Take by mouth.   Yes Historical Provider, MD  metFORMIN (GLUCOPHAGE-XR) 500 MG 24 hr tablet Take 2 tablets by mouth 2 (two) times daily. 06/12/15  Yes Historical Provider, MD  pravastatin (PRAVACHOL) 40 MG tablet Take 1 tablet by mouth at bedtime. 05/23/15  Yes Historical Provider, MD    Allergies  Allergen Reactions  . Xylocaine  [Lidocaine Hcl]     Other reaction(s): Other (See Comments) palpitatins and chest pain  . Propoxyphene     Past Surgical History  Procedure Laterality Date  . Coronary artery bypass graft  1996    x 3  . Partial hysterectomy  1976    ovaries remain  . Melanoma excision    . Basal cell carcinoma excision    . Appendectomy      Social History  Substance Use Topics  . Smoking status: Former Research scientist (life sciences)  . Smokeless  tobacco: None  . Alcohol Use: No     Medication list has been reviewed and updated.   Physical Exam  Constitutional: She is oriented to person, place, and time. She appears well-developed. No distress.  HENT:  Head: Normocephalic and atraumatic.  Eyes: Conjunctivae are normal. Right eye exhibits no discharge. Left eye exhibits no discharge. No scleral icterus.  Cardiovascular: Normal rate, regular rhythm and normal heart  sounds.   Pulmonary/Chest: Effort normal and breath sounds normal. No respiratory distress.  Abdominal: Soft. Normal appearance and bowel sounds are normal. There is no tenderness. There is no CVA tenderness.  Musculoskeletal: Normal range of motion.  Neurological: She is alert and oriented to person, place, and time.  Skin: Skin is warm and dry. No rash noted.  Psychiatric: She has a normal mood and affect. Her behavior is normal. Thought content normal.  Nursing note and vitals reviewed.   BP 104/64 mmHg  Pulse 80  Ht 5\' 6"  (1.676 m)  Wt 190 lb 9.6 oz (86.456 kg)  BMI 30.78 kg/m2  Assessment and Plan: 1. Acute cystitis without hematuria Increase fluid intake - ciprofloxacin (CIPRO) 250 MG tablet; Take 1 tablet (250 mg total) by mouth 2 (two) times daily.  Dispense: 10 tablet; Refill: 0  2. Vaginitis Single dose fluconazole prescribed - fluconazole (DIFLUCAN) 100 MG tablet; Take 1 tablet (100 mg total) by mouth daily.  Dispense: 1 tablet; Refill: 0   Halina Maidens, MD [facility]  08/11/2015

## 2015-08-13 ENCOUNTER — Other Ambulatory Visit: Payer: Self-pay | Admitting: Internal Medicine

## 2015-08-14 ENCOUNTER — Encounter: Payer: Self-pay | Admitting: Internal Medicine

## 2015-08-14 ENCOUNTER — Ambulatory Visit (INDEPENDENT_AMBULATORY_CARE_PROVIDER_SITE_OTHER): Payer: Medicare Other | Admitting: Internal Medicine

## 2015-08-14 VITALS — BP 148/80 | HR 72 | Ht 66.0 in | Wt 189.0 lb

## 2015-08-14 DIAGNOSIS — E1169 Type 2 diabetes mellitus with other specified complication: Secondary | ICD-10-CM | POA: Diagnosis not present

## 2015-08-14 DIAGNOSIS — F3341 Major depressive disorder, recurrent, in partial remission: Secondary | ICD-10-CM | POA: Diagnosis not present

## 2015-08-14 DIAGNOSIS — E785 Hyperlipidemia, unspecified: Secondary | ICD-10-CM

## 2015-08-14 DIAGNOSIS — W57XXXA Bitten or stung by nonvenomous insect and other nonvenomous arthropods, initial encounter: Secondary | ICD-10-CM | POA: Diagnosis not present

## 2015-08-14 DIAGNOSIS — Z0000 Encounter for general adult medical examination without abnormal findings: Secondary | ICD-10-CM

## 2015-08-14 DIAGNOSIS — Z794 Long term (current) use of insulin: Secondary | ICD-10-CM

## 2015-08-14 DIAGNOSIS — E1129 Type 2 diabetes mellitus with other diabetic kidney complication: Secondary | ICD-10-CM | POA: Insufficient documentation

## 2015-08-14 DIAGNOSIS — I2510 Atherosclerotic heart disease of native coronary artery without angina pectoris: Secondary | ICD-10-CM | POA: Insufficient documentation

## 2015-08-14 DIAGNOSIS — E890 Postprocedural hypothyroidism: Secondary | ICD-10-CM

## 2015-08-14 DIAGNOSIS — E119 Type 2 diabetes mellitus without complications: Secondary | ICD-10-CM

## 2015-08-14 DIAGNOSIS — T148 Other injury of unspecified body region: Secondary | ICD-10-CM | POA: Diagnosis not present

## 2015-08-14 DIAGNOSIS — C4330 Malignant melanoma of unspecified part of face: Secondary | ICD-10-CM

## 2015-08-14 DIAGNOSIS — Z23 Encounter for immunization: Secondary | ICD-10-CM

## 2015-08-14 DIAGNOSIS — E118 Type 2 diabetes mellitus with unspecified complications: Secondary | ICD-10-CM | POA: Insufficient documentation

## 2015-08-14 DIAGNOSIS — I10 Essential (primary) hypertension: Secondary | ICD-10-CM

## 2015-08-14 HISTORY — DX: Hyperlipidemia, unspecified: E78.5

## 2015-08-14 NOTE — Progress Notes (Signed)
Patient: Angela Rose, Female    DOB: 1946/06/05, 69 y.o.   MRN: [id number] Visit Date: 08/14/2015  Today's Provider: Halina Maidens, MD   Chief Complaint  Patient presents with  . Medicare Wellness   Subjective:    Annual wellness visit Angela Rose is a 69 y.o. female who presents today for her Subsequent Annual Wellness Visit. She feels poorly. She reports exercising none. She reports she is sleeping poorly.  She is under a lot of stress dealing with her dying sister-in-law. Her significant other also has mental health issues that are stressing her. She goes to sleep at night but wakes up in the morning feeling tense with back pain and muscle spasms. She takes ibuprofen 800 at bedtime nightly. She complains of generalized fatigue with a history of multiple tick bites. Has never been tested for Lyme or [facility] spotted fever. ----------------------------------------------------------- Diabetes She presents for her follow-up (She is in a study at [facility]. They do comprehensive blood work including A1c and renal function. Is currently on a study drug in addition to insulin and metformin and Amaryl) diabetic visit. She has type 2 diabetes mellitus. Her disease course has been stable. Pertinent negatives for hypoglycemia include no confusion, headaches or seizures. Associated symptoms include fatigue and weakness. Pertinent negatives for diabetes include no chest pain, no polydipsia and no polyuria. Symptoms are stable. She is following a generally unhealthy diet. When asked about meal planning, she reported none. Her breakfast blood glucose is taken between 7-8 am. Her breakfast blood glucose range is generally 130-140 mg/dl. An ACE inhibitor/angiotensin II receptor blocker is being taken. Eye exam is current (Mild background diabetic retinopathy by patient report).  Hypertension This is a chronic problem. The current episode started more than 1 year ago. The problem is unchanged.  The problem is controlled. Pertinent negatives include no chest pain, headaches, palpitations or shortness of breath. There are no associated agents to hypertension. Risk factors for coronary artery disease include diabetes mellitus and dyslipidemia. Past treatments include ACE inhibitors and diuretics. There are no compliance problems.   Coronary Artery Disease -  She status post coronary artery bypass. Is a history of bradycardia - currently not on any beta blockers. She denies any history of angina even when she had her heart attack. She is not satisfied with her current cardiologist so plans to change practices in the near future.   Depression - She is on Prozac daily. She feels that she is dealing fairly well with her current issues. Primarily her significant other is a Ship broker and her sister-in-law is actively dying on hospice. She agrees that her depression may be manifested as current physical symptoms of tension and fatigue.  Fatigue - she has ongoing fatigue with general muscle complaints. She denies dizziness or headache. She denies chest pains or orthopnea. She does no exercise. She does minimal housework. She's had multiple tick bites over the past few months.  Review of Systems  Constitutional: Positive for fatigue. Negative for fever, chills and appetite change.  HENT: Positive for hearing loss. Negative for sinus pressure, trouble swallowing and voice change.   Eyes: Positive for visual disturbance (cataracts).  Respiratory: Negative for choking, chest tightness, shortness of breath and wheezing.   Cardiovascular: Negative for chest pain, palpitations and leg swelling.  Gastrointestinal: Positive for constipation. Negative for nausea, vomiting and abdominal pain.  Endocrine: Negative for polydipsia and polyuria.  Genitourinary: Positive for dysuria (currently on cipro for uti).  Musculoskeletal: Positive for myalgias. Negative for  joint swelling and gait problem.  Skin: Negative for  color change and rash.  Neurological: Positive for weakness. Negative for seizures, light-headedness and headaches.  Hematological: Negative for adenopathy. Does not bruise/bleed easily.  Psychiatric/Behavioral: Positive for sleep disturbance and dysphoric mood. Negative for confusion.    Social History   Social History  . Marital Status: Divorced    Spouse Name: N/A  . Number of Children: N/A  . Years of Education: N/A   Occupational History  . Not on file.   Social History Main Topics  . Smoking status: Former Research scientist (life sciences)  . Smokeless tobacco: Not on file  . Alcohol Use: No  . Drug Use: Not on file  . Sexual Activity: Not on file   Other Topics Concern  . Not on file   Social History Narrative    Patient Active Problem List   Diagnosis Date Noted  . Type 2 diabetes mellitus without complication, with long-term current use of insulin ([place]) 08/14/2015  . Basal cell carcinoma 08/11/2015  . Melanoma of face ([place]) 08/11/2015  . Back ache 08/08/2015  . 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 Clinical depression 08/08/2015  . History of colon polyps 08/08/2015  . History of surgical procedure 08/08/2015  . HLD (hyperlipidemia) 08/08/2015  . Hypothyroidism, postablative 08/08/2015  . Carotid artery plaque 09/03/2014  . Essential (primary) hypertension 09/03/2014  . APC (atrial premature contractions) 09/03/2014    Past Surgical History  Procedure Laterality Date  . Coronary artery bypass graft  1996    x 3  . Partial hysterectomy  1976    ovaries remain  . Melanoma excision    . Basal cell carcinoma excision    . Appendectomy      Her family history is not on file.    Previous Medications   AMLODIPINE (NORVASC) 2.5 MG TABLET    Take by mouth.   ASPIRIN 81 MG TABLET    Take by mouth.   CIPROFLOXACIN (CIPRO) 250 MG TABLET    Take 1 tablet (250 mg total) by mouth 2 (two) times daily.   ENALAPRIL (VASOTEC) 5 MG TABLET    TAKE ONE TABLET BY  MOUTH TWICE DAILY   FLUCONAZOLE (DIFLUCAN) 100 MG TABLET    Take 1 tablet (100 mg total) by mouth daily.   FLUOXETINE (PROZAC) 40 MG CAPSULE    Take by mouth.   GLIMEPIRIDE (AMARYL) 4 MG TABLET    Take 1 tablet by mouth 2 (two) times daily.   GLUCOSE BLOOD (ONE TOUCH ULTRA TEST) TEST STRIP    Frequency:PHARMDIR   Dosage:0.0     Instructions:  Note:testing 3-4xqd, DX Code 250.00 Dose: 1   HYDROCHLOROTHIAZIDE (HYDRODIURIL) 25 MG TABLET    Take by mouth.   INSULIN GLARGINE (LANTUS SOLOSTAR) 100 UNIT/ML SOLOSTAR PEN    Inject into the skin.   LEVOTHYROXINE (SYNTHROID, LEVOTHROID) 25 MCG TABLET    Take by mouth.   METFORMIN (GLUCOPHAGE-XR) 500 MG 24 HR TABLET    Take 2 tablets by mouth 2 (two) times daily.   PRAVASTATIN (PRAVACHOL) 40 MG TABLET    Take 1 tablet by mouth at bedtime.    Patient Care Team: Glean Hess, MD as PCP - General (Family Medicine)     Objective:   Vitals: BP 148/80 mmHg  Pulse 72  Ht 5\' 6"  (1.676 m)  Wt 189 lb (85.73 kg)  BMI 30.52 kg/m2  Physical Exam  Constitutional: She is oriented to person, place, and time. She  appears well-developed and well-nourished. No distress.  HENT:  Head: Normocephalic and atraumatic.  Right Ear: Tympanic membrane and ear canal normal.  Left Ear: Tympanic membrane and ear canal normal.  Nose: Right sinus exhibits no maxillary sinus tenderness. Left sinus exhibits no maxillary sinus tenderness.  Mouth/Throat: Uvula is midline and oropharynx is clear and moist.  Eyes: Conjunctivae and EOM are normal. Right eye exhibits no discharge. Left eye exhibits no discharge. No scleral icterus.  Neck: Normal range of motion. Neck supple. Carotid bruit is not present. No erythema present. No thyromegaly present.  Cardiovascular: Normal rate, regular rhythm, normal heart sounds and normal pulses.   Pulmonary/Chest: Effort normal and breath sounds normal. No respiratory distress. She has no wheezes. Right breast exhibits no mass, no nipple  discharge, no skin change and no tenderness. Left breast exhibits no mass, no nipple discharge, no skin change and no tenderness.  Abdominal: Soft. Bowel sounds are normal. She exhibits no mass. There is no hepatosplenomegaly. There is no tenderness. There is no guarding and no CVA tenderness.  Musculoskeletal: Normal range of motion. She exhibits no edema or tenderness.  Lymphadenopathy:    She has no cervical adenopathy.    She has no axillary adenopathy.  Neurological: She is alert and oriented to person, place, and time. She has normal reflexes. No cranial nerve deficit or sensory deficit.  Skin: Skin is warm, dry and intact. No rash noted.  Psychiatric: She has a normal mood and affect. Her speech is normal and behavior is normal. Thought content normal.  Nursing note and vitals reviewed.   Activities of Daily Living In your present state of health, do you have any difficulty performing the following activities: 08/14/2015  Hearing? Y  Vision? Y  Difficulty concentrating or making decisions? N  Walking or climbing stairs? N  Dressing or bathing? N  Doing errands, shopping? N    Fall Risk Assessment Fall Risk  08/14/2015  Falls in the past year? No     Patient reports there are safety devices in place in shower at home.   Depression Screen PHQ 2/9 Scores 08/14/2015  PHQ - 2 Score 0    Cognitive Testing - 6-CIT   Correct? Score   What year is it? yes 0 Yes = 0    No = 4  What month is it? yes 0 Yes = 0    No = 3  Remember:     [address]ia [address], [place], [place]     What time is it? yes 0 Yes = 0    No = 3  Count backwards from 20 to 1 yes 0 Correct = 0    1 error = 2   More than 1 error = 4  Say the months of the year in reverse. yes 0 Correct = 0    1 error = 2   More than 1 error = 4  What address did I ask you to remember? yes 1 Correct = 0  1 error = 2    2 error = 4    3 error = 6    4 error = 8    All wrong = 10       TOTAL SCORE  1/28   Interpretation:  Normal   Normal (0-7) Abnormal (8-28)        Assessment & Plan:     Annual Wellness Visit  Reviewed patient's Family Medical History Reviewed and updated list of patient's medical providers  Assessment of cognitive impairment was done Assessed patient's functional ability Established a written schedule for health screening [place] Completed and Reviewed  Exercise Activities and Dietary recommendations Goals    None      Immunization History  Administered Date(s) Administered  . Pneumococcal Polysaccharide-23 10/14/2014    Health Maintenance  Topic Date Due  . HEMOGLOBIN A1C  1946/08/24  . Hepatitis C Screening  Apr 11, 1946  . FOOT EXAM  11/03/1953  . OPHTHALMOLOGY EXAM  11/03/1953  . URINE MICROALBUMIN  11/03/1953  . TETANUS/TDAP  11/03/1962  . ZOSTAVAX  11/04/2003  . DEXA SCAN  11/03/2008  . INFLUENZA VACCINE  05/12/2015  . PNA vac Low Risk Adult (2 of 2 - PCV13) 10/15/2015  . MAMMOGRAM  11/06/2016  . COLONOSCOPY  09/10/2022     Discussed health benefits of physical activity, and encouraged her to engage in regular exercise appropriate for her age and condition.    ------------------------------------------------------------------------------------------------------------  1. Medicare annual wellness visit, subsequent Medicare annual wellness visit measures satisfied  2. Essential hypertension Controlled; continue current regimen  3. Type 2 diabetes mellitus without complication, with long-term current use of insulin ([facility]) Followed by [facility] study  4. Hypothyroidism, postablative Continue supplement; adjust dose if needed - TSH  5. Hyperlipidemia associated with type 2 diabetes mellitus ([place]) On statin therapy  6. Melanoma of face [facility]) Continue routine dermatology evaluations  7. Recurrent major depressive disorder, in partial remission ([place]) Continue Prozac; somatic symptoms may be related to depression and current situational  stresses  8. Tick bite Rule out Lyme disease - B. [place] Antibodies  9. Need for vaccination for H flu type B - Flu Vaccine QUAD 36+ mos IM  10. Coronary artery disease involving native coronary artery of native heart without angina pectoris Stable without angina; establish with new cardiologist as soon as possible   Halina Maidens, MD [facility]  08/14/2015

## 2015-08-14 NOTE — Patient Instructions (Addendum)
Health Maintenance  Topic Date Due  .    .    .    .    .    . TETANUS/TDAP  11/03/1962  . ZOSTAVAX  11/04/2003  . DEXA SCAN  11/03/2008  . INFLUENZA VACCINE  Done today  . PNA vac Low Risk Adult (2 of 2 - PCV13) 10/15/2015  . MAMMOGRAM  11/07/2015  . COLONOSCOPY  09/10/2022

## 2015-08-15 LAB — TSH: TSH: 3.39 u[IU]/mL (ref 0.450–4.500)

## 2015-08-15 LAB — B. BURGDORFI ANTIBODIES: Lyme IgG/IgM Ab: <0.91 {ISR} (ref 0.00–0.90)

## 2015-10-13 ENCOUNTER — Other Ambulatory Visit: Payer: Self-pay | Admitting: Internal Medicine

## 2015-10-29 DIAGNOSIS — Z85828 Personal history of other malignant neoplasm of skin: Secondary | ICD-10-CM | POA: Diagnosis not present

## 2015-10-29 DIAGNOSIS — Z85820 Personal history of malignant melanoma of skin: Secondary | ICD-10-CM | POA: Diagnosis not present

## 2015-10-29 DIAGNOSIS — L570 Actinic keratosis: Secondary | ICD-10-CM | POA: Diagnosis not present

## 2015-10-29 DIAGNOSIS — B372 Candidiasis of skin and nail: Secondary | ICD-10-CM | POA: Diagnosis not present

## 2015-10-29 DIAGNOSIS — D485 Neoplasm of uncertain behavior of skin: Secondary | ICD-10-CM | POA: Diagnosis not present

## 2015-10-29 DIAGNOSIS — Z1283 Encounter for screening for malignant neoplasm of skin: Secondary | ICD-10-CM | POA: Diagnosis not present

## 2015-10-29 DIAGNOSIS — Z08 Encounter for follow-up examination after completed treatment for malignant neoplasm: Secondary | ICD-10-CM | POA: Diagnosis not present

## 2015-11-11 ENCOUNTER — Encounter: Payer: Self-pay | Admitting: Internal Medicine

## 2015-11-14 ENCOUNTER — Other Ambulatory Visit: Payer: Self-pay

## 2015-11-14 MED ORDER — ENALAPRIL MALEATE 5 MG PO TABS: 5.0000 mg | ORAL_TABLET | Freq: Two times a day (BID) | ORAL | Status: DC

## 2015-11-14 MED ORDER — LEVOTHYROXINE SODIUM 25 MCG PO TABS: 25.0000 ug | ORAL_TABLET | Freq: Every day | ORAL | Status: DC

## 2015-11-14 MED ORDER — AMLODIPINE BESYLATE 2.5 MG PO TABS: 2.5000 mg | ORAL_TABLET | Freq: Every day | ORAL | Status: DC

## 2015-11-14 MED ORDER — PRAVASTATIN SODIUM 40 MG PO TABS: 40.0000 mg | ORAL_TABLET | Freq: Every day | ORAL | Status: DC

## 2015-11-14 MED ORDER — GLIMEPIRIDE 4 MG PO TABS: 4.0000 mg | ORAL_TABLET | Freq: Two times a day (BID) | ORAL | Status: DC

## 2015-11-14 MED ORDER — HYDROCHLOROTHIAZIDE 25 MG PO TABS: 25.0000 mg | ORAL_TABLET | Freq: Every day | ORAL | Status: DC

## 2015-11-14 MED ORDER — FLUOXETINE HCL 40 MG PO CAPS: 40.0000 mg | ORAL_CAPSULE | Freq: Every day | ORAL | Status: DC

## 2015-11-14 NOTE — Telephone Encounter (Signed)
Received fax from new mail order pharmacy.

## 2015-11-18 NOTE — Telephone Encounter (Signed)
pts coming in on 02/16/16 for her follow up appt

## 2015-12-08 DIAGNOSIS — D225 Melanocytic nevi of trunk: Secondary | ICD-10-CM | POA: Diagnosis not present

## 2015-12-08 DIAGNOSIS — D485 Neoplasm of uncertain behavior of skin: Secondary | ICD-10-CM | POA: Diagnosis not present

## 2015-12-19 ENCOUNTER — Encounter: Payer: Self-pay | Admitting: *Deleted

## 2015-12-30 DIAGNOSIS — W06XXXA Fall from bed, initial encounter: Secondary | ICD-10-CM | POA: Diagnosis not present

## 2015-12-30 DIAGNOSIS — I517 Cardiomegaly: Secondary | ICD-10-CM | POA: Diagnosis not present

## 2015-12-30 DIAGNOSIS — S82145A Nondisplaced bicondylar fracture of left tibia, initial encounter for closed fracture: Secondary | ICD-10-CM | POA: Diagnosis not present

## 2015-12-30 DIAGNOSIS — E1136 Type 2 diabetes mellitus with diabetic cataract: Secondary | ICD-10-CM | POA: Diagnosis not present

## 2015-12-30 DIAGNOSIS — J101 Influenza due to other identified influenza virus with other respiratory manifestations: Secondary | ICD-10-CM | POA: Diagnosis not present

## 2015-12-30 DIAGNOSIS — F329 Major depressive disorder, single episode, unspecified: Secondary | ICD-10-CM | POA: Diagnosis not present

## 2015-12-30 DIAGNOSIS — E039 Hypothyroidism, unspecified: Secondary | ICD-10-CM | POA: Diagnosis not present

## 2015-12-30 DIAGNOSIS — M25462 Effusion, left knee: Secondary | ICD-10-CM | POA: Diagnosis not present

## 2015-12-30 DIAGNOSIS — I10 Essential (primary) hypertension: Secondary | ICD-10-CM | POA: Diagnosis not present

## 2015-12-30 DIAGNOSIS — S8992XA Unspecified injury of left lower leg, initial encounter: Secondary | ICD-10-CM | POA: Diagnosis not present

## 2015-12-30 DIAGNOSIS — M779 Enthesopathy, unspecified: Secondary | ICD-10-CM | POA: Diagnosis not present

## 2015-12-30 DIAGNOSIS — W19XXXA Unspecified fall, initial encounter: Secondary | ICD-10-CM | POA: Diagnosis not present

## 2015-12-30 DIAGNOSIS — T796XXA Traumatic ischemia of muscle, initial encounter: Secondary | ICD-10-CM | POA: Diagnosis not present

## 2015-12-30 DIAGNOSIS — R509 Fever, unspecified: Secondary | ICD-10-CM | POA: Diagnosis not present

## 2015-12-30 DIAGNOSIS — M25562 Pain in left knee: Secondary | ICD-10-CM | POA: Diagnosis not present

## 2015-12-30 DIAGNOSIS — E785 Hyperlipidemia, unspecified: Secondary | ICD-10-CM | POA: Diagnosis not present

## 2015-12-30 DIAGNOSIS — M25569 Pain in unspecified knee: Secondary | ICD-10-CM | POA: Diagnosis not present

## 2015-12-30 DIAGNOSIS — J09X2 Influenza due to identified novel influenza A virus with other respiratory manifestations: Secondary | ICD-10-CM | POA: Diagnosis not present

## 2015-12-30 DIAGNOSIS — S83242A Other tear of medial meniscus, current injury, left knee, initial encounter: Secondary | ICD-10-CM | POA: Diagnosis not present

## 2015-12-31 DIAGNOSIS — R8271 Bacteriuria: Secondary | ICD-10-CM | POA: Diagnosis not present

## 2015-12-31 DIAGNOSIS — M25462 Effusion, left knee: Secondary | ICD-10-CM | POA: Diagnosis not present

## 2015-12-31 DIAGNOSIS — E119 Type 2 diabetes mellitus without complications: Secondary | ICD-10-CM | POA: Diagnosis not present

## 2015-12-31 DIAGNOSIS — S83412A Sprain of medial collateral ligament of left knee, initial encounter: Secondary | ICD-10-CM | POA: Diagnosis not present

## 2015-12-31 DIAGNOSIS — M25562 Pain in left knee: Secondary | ICD-10-CM | POA: Diagnosis not present

## 2015-12-31 DIAGNOSIS — J09X2 Influenza due to identified novel influenza A virus with other respiratory manifestations: Secondary | ICD-10-CM | POA: Diagnosis not present

## 2015-12-31 DIAGNOSIS — S82143A Displaced bicondylar fracture of unspecified tibia, initial encounter for closed fracture: Secondary | ICD-10-CM | POA: Insufficient documentation

## 2015-12-31 DIAGNOSIS — S83242A Other tear of medial meniscus, current injury, left knee, initial encounter: Secondary | ICD-10-CM

## 2015-12-31 HISTORY — DX: Displaced bicondylar fracture of unspecified tibia, initial encounter for closed fracture: S82.143A

## 2015-12-31 HISTORY — DX: Other tear of medial meniscus, current injury, left knee, initial encounter: S83.242A

## 2016-01-01 DIAGNOSIS — J09X2 Influenza due to identified novel influenza A virus with other respiratory manifestations: Secondary | ICD-10-CM | POA: Diagnosis not present

## 2016-01-01 DIAGNOSIS — W19XXXA Unspecified fall, initial encounter: Secondary | ICD-10-CM | POA: Diagnosis not present

## 2016-01-01 DIAGNOSIS — E119 Type 2 diabetes mellitus without complications: Secondary | ICD-10-CM | POA: Diagnosis not present

## 2016-01-01 DIAGNOSIS — E039 Hypothyroidism, unspecified: Secondary | ICD-10-CM | POA: Diagnosis not present

## 2016-01-01 DIAGNOSIS — S83242A Other tear of medial meniscus, current injury, left knee, initial encounter: Secondary | ICD-10-CM | POA: Diagnosis not present

## 2016-01-01 DIAGNOSIS — S83132A Medial subluxation of proximal end of tibia, left knee, initial encounter: Secondary | ICD-10-CM | POA: Diagnosis not present

## 2016-01-01 DIAGNOSIS — S82102A Unspecified fracture of upper end of left tibia, initial encounter for closed fracture: Secondary | ICD-10-CM | POA: Diagnosis not present

## 2016-01-01 DIAGNOSIS — E785 Hyperlipidemia, unspecified: Secondary | ICD-10-CM | POA: Diagnosis not present

## 2016-01-01 DIAGNOSIS — R2689 Other abnormalities of gait and mobility: Secondary | ICD-10-CM | POA: Diagnosis not present

## 2016-01-02 ENCOUNTER — Encounter: Payer: Self-pay | Admitting: Internal Medicine

## 2016-01-05 ENCOUNTER — Other Ambulatory Visit: Payer: Self-pay

## 2016-01-05 MED ORDER — AMLODIPINE BESYLATE 2.5 MG PO TABS: 2.5000 mg | ORAL_TABLET | Freq: Every day | ORAL | Status: DC

## 2016-01-05 NOTE — Telephone Encounter (Signed)
Patient needs medication sent to mail order pharmacy.

## 2016-01-06 DIAGNOSIS — I10 Essential (primary) hypertension: Secondary | ICD-10-CM | POA: Diagnosis not present

## 2016-01-06 DIAGNOSIS — S82132D Displaced fracture of medial condyle of left tibia, subsequent encounter for closed fracture with routine healing: Secondary | ICD-10-CM | POA: Diagnosis not present

## 2016-01-06 DIAGNOSIS — J09X2 Influenza due to identified novel influenza A virus with other respiratory manifestations: Secondary | ICD-10-CM | POA: Diagnosis not present

## 2016-01-06 DIAGNOSIS — S83242D Other tear of medial meniscus, current injury, left knee, subsequent encounter: Secondary | ICD-10-CM | POA: Diagnosis not present

## 2016-01-06 DIAGNOSIS — F329 Major depressive disorder, single episode, unspecified: Secondary | ICD-10-CM | POA: Diagnosis not present

## 2016-01-06 DIAGNOSIS — E119 Type 2 diabetes mellitus without complications: Secondary | ICD-10-CM | POA: Diagnosis not present

## 2016-01-08 DIAGNOSIS — L608 Other nail disorders: Secondary | ICD-10-CM | POA: Diagnosis not present

## 2016-01-13 DIAGNOSIS — F329 Major depressive disorder, single episode, unspecified: Secondary | ICD-10-CM | POA: Diagnosis not present

## 2016-01-13 DIAGNOSIS — J09X2 Influenza due to identified novel influenza A virus with other respiratory manifestations: Secondary | ICD-10-CM | POA: Diagnosis not present

## 2016-01-13 DIAGNOSIS — E119 Type 2 diabetes mellitus without complications: Secondary | ICD-10-CM | POA: Diagnosis not present

## 2016-01-13 DIAGNOSIS — S82132D Displaced fracture of medial condyle of left tibia, subsequent encounter for closed fracture with routine healing: Secondary | ICD-10-CM | POA: Diagnosis not present

## 2016-01-13 DIAGNOSIS — I10 Essential (primary) hypertension: Secondary | ICD-10-CM | POA: Diagnosis not present

## 2016-01-13 DIAGNOSIS — S83242D Other tear of medial meniscus, current injury, left knee, subsequent encounter: Secondary | ICD-10-CM | POA: Diagnosis not present

## 2016-01-14 DIAGNOSIS — M25462 Effusion, left knee: Secondary | ICD-10-CM | POA: Diagnosis not present

## 2016-01-14 DIAGNOSIS — S83242A Other tear of medial meniscus, current injury, left knee, initial encounter: Secondary | ICD-10-CM | POA: Diagnosis not present

## 2016-01-14 DIAGNOSIS — S82145A Nondisplaced bicondylar fracture of left tibia, initial encounter for closed fracture: Secondary | ICD-10-CM | POA: Diagnosis not present

## 2016-01-14 DIAGNOSIS — S82142A Displaced bicondylar fracture of left tibia, initial encounter for closed fracture: Secondary | ICD-10-CM | POA: Diagnosis not present

## 2016-01-16 DIAGNOSIS — I10 Essential (primary) hypertension: Secondary | ICD-10-CM | POA: Diagnosis not present

## 2016-01-16 DIAGNOSIS — S82132D Displaced fracture of medial condyle of left tibia, subsequent encounter for closed fracture with routine healing: Secondary | ICD-10-CM | POA: Diagnosis not present

## 2016-01-16 DIAGNOSIS — J09X2 Influenza due to identified novel influenza A virus with other respiratory manifestations: Secondary | ICD-10-CM | POA: Diagnosis not present

## 2016-01-16 DIAGNOSIS — S83242D Other tear of medial meniscus, current injury, left knee, subsequent encounter: Secondary | ICD-10-CM | POA: Diagnosis not present

## 2016-01-16 DIAGNOSIS — F329 Major depressive disorder, single episode, unspecified: Secondary | ICD-10-CM | POA: Diagnosis not present

## 2016-01-16 DIAGNOSIS — E119 Type 2 diabetes mellitus without complications: Secondary | ICD-10-CM | POA: Diagnosis not present

## 2016-01-19 DIAGNOSIS — J09X2 Influenza due to identified novel influenza A virus with other respiratory manifestations: Secondary | ICD-10-CM | POA: Diagnosis not present

## 2016-01-19 DIAGNOSIS — F329 Major depressive disorder, single episode, unspecified: Secondary | ICD-10-CM | POA: Diagnosis not present

## 2016-01-19 DIAGNOSIS — E119 Type 2 diabetes mellitus without complications: Secondary | ICD-10-CM | POA: Diagnosis not present

## 2016-01-19 DIAGNOSIS — I10 Essential (primary) hypertension: Secondary | ICD-10-CM | POA: Diagnosis not present

## 2016-01-19 DIAGNOSIS — S83242D Other tear of medial meniscus, current injury, left knee, subsequent encounter: Secondary | ICD-10-CM | POA: Diagnosis not present

## 2016-01-19 DIAGNOSIS — S82132D Displaced fracture of medial condyle of left tibia, subsequent encounter for closed fracture with routine healing: Secondary | ICD-10-CM | POA: Diagnosis not present

## 2016-01-20 DIAGNOSIS — S82132D Displaced fracture of medial condyle of left tibia, subsequent encounter for closed fracture with routine healing: Secondary | ICD-10-CM | POA: Diagnosis not present

## 2016-01-20 DIAGNOSIS — E119 Type 2 diabetes mellitus without complications: Secondary | ICD-10-CM | POA: Diagnosis not present

## 2016-01-20 DIAGNOSIS — F329 Major depressive disorder, single episode, unspecified: Secondary | ICD-10-CM | POA: Diagnosis not present

## 2016-01-20 DIAGNOSIS — I10 Essential (primary) hypertension: Secondary | ICD-10-CM | POA: Diagnosis not present

## 2016-01-20 DIAGNOSIS — J09X2 Influenza due to identified novel influenza A virus with other respiratory manifestations: Secondary | ICD-10-CM | POA: Diagnosis not present

## 2016-01-20 DIAGNOSIS — S83242D Other tear of medial meniscus, current injury, left knee, subsequent encounter: Secondary | ICD-10-CM | POA: Diagnosis not present

## 2016-01-22 DIAGNOSIS — J09X2 Influenza due to identified novel influenza A virus with other respiratory manifestations: Secondary | ICD-10-CM | POA: Diagnosis not present

## 2016-01-22 DIAGNOSIS — E119 Type 2 diabetes mellitus without complications: Secondary | ICD-10-CM | POA: Diagnosis not present

## 2016-01-22 DIAGNOSIS — I10 Essential (primary) hypertension: Secondary | ICD-10-CM | POA: Diagnosis not present

## 2016-01-22 DIAGNOSIS — F329 Major depressive disorder, single episode, unspecified: Secondary | ICD-10-CM | POA: Diagnosis not present

## 2016-01-22 DIAGNOSIS — S82132D Displaced fracture of medial condyle of left tibia, subsequent encounter for closed fracture with routine healing: Secondary | ICD-10-CM | POA: Diagnosis not present

## 2016-01-22 DIAGNOSIS — S83242D Other tear of medial meniscus, current injury, left knee, subsequent encounter: Secondary | ICD-10-CM | POA: Diagnosis not present

## 2016-01-26 DIAGNOSIS — S83242D Other tear of medial meniscus, current injury, left knee, subsequent encounter: Secondary | ICD-10-CM | POA: Diagnosis not present

## 2016-01-26 DIAGNOSIS — E119 Type 2 diabetes mellitus without complications: Secondary | ICD-10-CM | POA: Diagnosis not present

## 2016-01-26 DIAGNOSIS — I10 Essential (primary) hypertension: Secondary | ICD-10-CM | POA: Diagnosis not present

## 2016-01-26 DIAGNOSIS — S82132D Displaced fracture of medial condyle of left tibia, subsequent encounter for closed fracture with routine healing: Secondary | ICD-10-CM | POA: Diagnosis not present

## 2016-01-26 DIAGNOSIS — J09X2 Influenza due to identified novel influenza A virus with other respiratory manifestations: Secondary | ICD-10-CM | POA: Diagnosis not present

## 2016-01-26 DIAGNOSIS — F329 Major depressive disorder, single episode, unspecified: Secondary | ICD-10-CM | POA: Diagnosis not present

## 2016-01-27 DIAGNOSIS — J09X2 Influenza due to identified novel influenza A virus with other respiratory manifestations: Secondary | ICD-10-CM | POA: Diagnosis not present

## 2016-01-27 DIAGNOSIS — I10 Essential (primary) hypertension: Secondary | ICD-10-CM | POA: Diagnosis not present

## 2016-01-27 DIAGNOSIS — E119 Type 2 diabetes mellitus without complications: Secondary | ICD-10-CM | POA: Diagnosis not present

## 2016-01-27 DIAGNOSIS — F329 Major depressive disorder, single episode, unspecified: Secondary | ICD-10-CM | POA: Diagnosis not present

## 2016-01-27 DIAGNOSIS — S82132D Displaced fracture of medial condyle of left tibia, subsequent encounter for closed fracture with routine healing: Secondary | ICD-10-CM | POA: Diagnosis not present

## 2016-01-27 DIAGNOSIS — S83242D Other tear of medial meniscus, current injury, left knee, subsequent encounter: Secondary | ICD-10-CM | POA: Diagnosis not present

## 2016-01-28 DIAGNOSIS — F329 Major depressive disorder, single episode, unspecified: Secondary | ICD-10-CM | POA: Diagnosis not present

## 2016-01-28 DIAGNOSIS — E119 Type 2 diabetes mellitus without complications: Secondary | ICD-10-CM | POA: Diagnosis not present

## 2016-01-28 DIAGNOSIS — S82132D Displaced fracture of medial condyle of left tibia, subsequent encounter for closed fracture with routine healing: Secondary | ICD-10-CM | POA: Diagnosis not present

## 2016-01-28 DIAGNOSIS — I10 Essential (primary) hypertension: Secondary | ICD-10-CM | POA: Diagnosis not present

## 2016-01-28 DIAGNOSIS — S83242D Other tear of medial meniscus, current injury, left knee, subsequent encounter: Secondary | ICD-10-CM | POA: Diagnosis not present

## 2016-01-28 DIAGNOSIS — J09X2 Influenza due to identified novel influenza A virus with other respiratory manifestations: Secondary | ICD-10-CM | POA: Diagnosis not present

## 2016-01-29 DIAGNOSIS — I10 Essential (primary) hypertension: Secondary | ICD-10-CM | POA: Diagnosis not present

## 2016-01-29 DIAGNOSIS — J09X2 Influenza due to identified novel influenza A virus with other respiratory manifestations: Secondary | ICD-10-CM | POA: Diagnosis not present

## 2016-01-29 DIAGNOSIS — F329 Major depressive disorder, single episode, unspecified: Secondary | ICD-10-CM | POA: Diagnosis not present

## 2016-01-29 DIAGNOSIS — E119 Type 2 diabetes mellitus without complications: Secondary | ICD-10-CM | POA: Diagnosis not present

## 2016-01-29 DIAGNOSIS — S82132D Displaced fracture of medial condyle of left tibia, subsequent encounter for closed fracture with routine healing: Secondary | ICD-10-CM | POA: Diagnosis not present

## 2016-01-29 DIAGNOSIS — S83242D Other tear of medial meniscus, current injury, left knee, subsequent encounter: Secondary | ICD-10-CM | POA: Diagnosis not present

## 2016-02-01 DIAGNOSIS — S83132A Medial subluxation of proximal end of tibia, left knee, initial encounter: Secondary | ICD-10-CM | POA: Diagnosis not present

## 2016-02-01 DIAGNOSIS — R2689 Other abnormalities of gait and mobility: Secondary | ICD-10-CM | POA: Diagnosis not present

## 2016-02-01 DIAGNOSIS — S83242A Other tear of medial meniscus, current injury, left knee, initial encounter: Secondary | ICD-10-CM | POA: Diagnosis not present

## 2016-02-02 DIAGNOSIS — E119 Type 2 diabetes mellitus without complications: Secondary | ICD-10-CM | POA: Diagnosis not present

## 2016-02-02 DIAGNOSIS — J09X2 Influenza due to identified novel influenza A virus with other respiratory manifestations: Secondary | ICD-10-CM | POA: Diagnosis not present

## 2016-02-02 DIAGNOSIS — I10 Essential (primary) hypertension: Secondary | ICD-10-CM | POA: Diagnosis not present

## 2016-02-02 DIAGNOSIS — S82132D Displaced fracture of medial condyle of left tibia, subsequent encounter for closed fracture with routine healing: Secondary | ICD-10-CM | POA: Diagnosis not present

## 2016-02-02 DIAGNOSIS — S83242D Other tear of medial meniscus, current injury, left knee, subsequent encounter: Secondary | ICD-10-CM | POA: Diagnosis not present

## 2016-02-02 DIAGNOSIS — F329 Major depressive disorder, single episode, unspecified: Secondary | ICD-10-CM | POA: Diagnosis not present

## 2016-02-04 ENCOUNTER — Other Ambulatory Visit: Payer: Self-pay

## 2016-02-04 DIAGNOSIS — S83242D Other tear of medial meniscus, current injury, left knee, subsequent encounter: Secondary | ICD-10-CM | POA: Diagnosis not present

## 2016-02-04 DIAGNOSIS — J09X2 Influenza due to identified novel influenza A virus with other respiratory manifestations: Secondary | ICD-10-CM | POA: Diagnosis not present

## 2016-02-04 DIAGNOSIS — I10 Essential (primary) hypertension: Secondary | ICD-10-CM | POA: Diagnosis not present

## 2016-02-04 DIAGNOSIS — E119 Type 2 diabetes mellitus without complications: Secondary | ICD-10-CM | POA: Diagnosis not present

## 2016-02-04 DIAGNOSIS — S82132D Displaced fracture of medial condyle of left tibia, subsequent encounter for closed fracture with routine healing: Secondary | ICD-10-CM | POA: Diagnosis not present

## 2016-02-04 DIAGNOSIS — F329 Major depressive disorder, single episode, unspecified: Secondary | ICD-10-CM | POA: Diagnosis not present

## 2016-02-04 MED ORDER — METFORMIN HCL ER 500 MG PO TB24: 1000.0000 mg | ORAL_TABLET | Freq: Two times a day (BID) | ORAL | Status: DC

## 2016-02-04 NOTE — Telephone Encounter (Signed)
Received fax from pharmacy requesting refill.

## 2016-02-06 ENCOUNTER — Other Ambulatory Visit: Payer: Self-pay

## 2016-02-06 MED ORDER — METFORMIN HCL ER 500 MG PO TB24: 1000.0000 mg | ORAL_TABLET | Freq: Two times a day (BID) | ORAL | Status: DC

## 2016-02-06 NOTE — Telephone Encounter (Signed)
Received fax from pharmacy requesting medication.  

## 2016-02-10 DIAGNOSIS — J09X2 Influenza due to identified novel influenza A virus with other respiratory manifestations: Secondary | ICD-10-CM | POA: Diagnosis not present

## 2016-02-10 DIAGNOSIS — S82132D Displaced fracture of medial condyle of left tibia, subsequent encounter for closed fracture with routine healing: Secondary | ICD-10-CM | POA: Diagnosis not present

## 2016-02-10 DIAGNOSIS — F329 Major depressive disorder, single episode, unspecified: Secondary | ICD-10-CM | POA: Diagnosis not present

## 2016-02-10 DIAGNOSIS — I10 Essential (primary) hypertension: Secondary | ICD-10-CM | POA: Diagnosis not present

## 2016-02-10 DIAGNOSIS — S83242D Other tear of medial meniscus, current injury, left knee, subsequent encounter: Secondary | ICD-10-CM | POA: Diagnosis not present

## 2016-02-10 DIAGNOSIS — E119 Type 2 diabetes mellitus without complications: Secondary | ICD-10-CM | POA: Diagnosis not present

## 2016-02-11 DIAGNOSIS — S82142S Displaced bicondylar fracture of left tibia, sequela: Secondary | ICD-10-CM | POA: Diagnosis not present

## 2016-02-11 DIAGNOSIS — S82142D Displaced bicondylar fracture of left tibia, subsequent encounter for closed fracture with routine healing: Secondary | ICD-10-CM | POA: Diagnosis not present

## 2016-02-11 DIAGNOSIS — S82002D Unspecified fracture of left patella, subsequent encounter for closed fracture with routine healing: Secondary | ICD-10-CM | POA: Diagnosis not present

## 2016-02-12 DIAGNOSIS — I10 Essential (primary) hypertension: Secondary | ICD-10-CM | POA: Diagnosis not present

## 2016-02-12 DIAGNOSIS — F329 Major depressive disorder, single episode, unspecified: Secondary | ICD-10-CM | POA: Diagnosis not present

## 2016-02-12 DIAGNOSIS — S83242D Other tear of medial meniscus, current injury, left knee, subsequent encounter: Secondary | ICD-10-CM | POA: Diagnosis not present

## 2016-02-12 DIAGNOSIS — S82132D Displaced fracture of medial condyle of left tibia, subsequent encounter for closed fracture with routine healing: Secondary | ICD-10-CM | POA: Diagnosis not present

## 2016-02-12 DIAGNOSIS — J09X2 Influenza due to identified novel influenza A virus with other respiratory manifestations: Secondary | ICD-10-CM | POA: Diagnosis not present

## 2016-02-12 DIAGNOSIS — E119 Type 2 diabetes mellitus without complications: Secondary | ICD-10-CM | POA: Diagnosis not present

## 2016-02-13 DIAGNOSIS — S82132D Displaced fracture of medial condyle of left tibia, subsequent encounter for closed fracture with routine healing: Secondary | ICD-10-CM | POA: Diagnosis not present

## 2016-02-13 DIAGNOSIS — S83242D Other tear of medial meniscus, current injury, left knee, subsequent encounter: Secondary | ICD-10-CM | POA: Diagnosis not present

## 2016-02-13 DIAGNOSIS — J09X2 Influenza due to identified novel influenza A virus with other respiratory manifestations: Secondary | ICD-10-CM | POA: Diagnosis not present

## 2016-02-13 DIAGNOSIS — E119 Type 2 diabetes mellitus without complications: Secondary | ICD-10-CM | POA: Diagnosis not present

## 2016-02-13 DIAGNOSIS — F329 Major depressive disorder, single episode, unspecified: Secondary | ICD-10-CM | POA: Diagnosis not present

## 2016-02-13 DIAGNOSIS — I10 Essential (primary) hypertension: Secondary | ICD-10-CM | POA: Diagnosis not present

## 2016-02-14 ENCOUNTER — Encounter: Payer: Self-pay | Admitting: Internal Medicine

## 2016-02-14 ENCOUNTER — Other Ambulatory Visit: Payer: Self-pay | Admitting: Internal Medicine

## 2016-02-16 ENCOUNTER — Encounter: Payer: Self-pay | Admitting: Internal Medicine

## 2016-02-16 ENCOUNTER — Ambulatory Visit (INDEPENDENT_AMBULATORY_CARE_PROVIDER_SITE_OTHER): Payer: Commercial Managed Care - HMO | Admitting: Internal Medicine

## 2016-02-16 ENCOUNTER — Other Ambulatory Visit: Payer: Self-pay

## 2016-02-16 VITALS — BP 128/84 | HR 76 | Resp 16 | Ht 66.0 in | Wt 186.0 lb

## 2016-02-16 DIAGNOSIS — Z794 Long term (current) use of insulin: Secondary | ICD-10-CM | POA: Diagnosis not present

## 2016-02-16 DIAGNOSIS — S83242A Other tear of medial meniscus, current injury, left knee, initial encounter: Secondary | ICD-10-CM

## 2016-02-16 DIAGNOSIS — Z23 Encounter for immunization: Secondary | ICD-10-CM

## 2016-02-16 DIAGNOSIS — Z1231 Encounter for screening mammogram for malignant neoplasm of breast: Secondary | ICD-10-CM | POA: Diagnosis not present

## 2016-02-16 DIAGNOSIS — I10 Essential (primary) hypertension: Secondary | ICD-10-CM | POA: Diagnosis not present

## 2016-02-16 DIAGNOSIS — E890 Postprocedural hypothyroidism: Secondary | ICD-10-CM | POA: Diagnosis not present

## 2016-02-16 DIAGNOSIS — E119 Type 2 diabetes mellitus without complications: Secondary | ICD-10-CM

## 2016-02-16 NOTE — Patient Instructions (Signed)
Pneumococcal Polysaccharide Vaccine: What You Need to Know  1. Why get vaccinated?  Vaccination can protect older adults (and some children and younger adults) from pneumococcal disease.  Pneumococcal disease is caused by bacteria that can spread from person to person through close contact. It can cause ear infections, and it can also lead to more serious infections of the:   · Lungs (pneumonia),  · Blood (bacteremia), and  · Covering of the brain and spinal cord (meningitis). Meningitis can cause deafness and brain damage, and it can be fatal.  Anyone can get pneumococcal disease, but children under 2 years of age, people with certain medical conditions, adults over 65 years of age, and cigarette smokers are at the highest risk.  About 18,000 older adults die each year from pneumococcal disease in the [place].  Treatment of pneumococcal infections with penicillin and other drugs used to be more effective. But some strains of the disease have become resistant to these drugs. This makes prevention of the disease, through vaccination, even more important.  2. Pneumococcal polysaccharide vaccine (PPSV23)  Pneumococcal polysaccharide vaccine (PPSV23) protects against 23 types of pneumococcal bacteria. It will not prevent all pneumococcal disease.  PPSV23 is recommended for:  · All adults 65 years of age and older,  · Anyone 2 through 72 years of age with certain long-term health problems,  · Anyone 2 through 72 years of age with a weakened immune system,  · Adults 19 through 72 years of age who smoke cigarettes or have asthma.  Most people need only one dose of PPSV. A second dose is recommended for certain high-risk groups. People 65 and older should get a dose even if they have gotten one or more doses of the vaccine before they turned 65.  Your healthcare provider can give you more information about these recommendations.  Most healthy adults develop protection within 2 to 3 weeks of getting the shot.  3. Some  people should not get this vaccine  · Anyone who has had a life-threatening allergic reaction to PPSV should not get another dose.  · Anyone who has a severe allergy to any component of PPSV should not receive it. Tell your provider if you have any severe allergies.  · Anyone who is moderately or severely ill when the shot is scheduled may be asked to wait until they recover before getting the vaccine. Someone with a mild illness can usually be vaccinated.  · Children less than 2 years of age should not receive this vaccine.  · There is no evidence that PPSV is harmful to either a pregnant woman or to her fetus. However, as a precaution, women who need the vaccine should be vaccinated before becoming pregnant, if possible.  4. Risks of a vaccine reaction  With any medicine, including vaccines, there is a chance of side effects. These are usually mild and go away on their own, but serious reactions are also possible.  About half of people who get PPSV have mild side effects, such as redness or pain where the shot is given, which go away within about two days.  Less than 1 out of 100 people develop a fever, muscle aches, or more severe local reactions.  Problems that could happen after any vaccine:  · People sometimes faint after a medical procedure, including vaccination. Sitting or lying down for about 15 minutes can help prevent fainting, and injuries caused by a fall. Tell your doctor if you feel dizzy, or have vision changes or   ringing in the ears.  · Some people get severe pain in the shoulder and have difficulty moving the arm where a shot was given. This happens very rarely.  · Any medication can cause a severe allergic reaction. Such reactions from a vaccine are very rare, estimated at about 1 in a million doses, and would happen within a few minutes to a few hours after the vaccination.  As with any medicine, there is a very remote chance of a vaccine causing a serious injury or death.  The safety of  vaccines is always being monitored. For more information, visit: www.cdc.gov/vaccinesafety/  5. What if there is a serious reaction?  What should I look for?  Look for anything that concerns you, such as signs of a severe allergic reaction, very high fever, or unusual behavior.   Signs of a severe allergic reaction can include hives, swelling of the face and throat, difficulty breathing, a fast heartbeat, dizziness, and weakness. These would usually start a few minutes to a few hours after the vaccination.  What should I do?  If you think it is a severe allergic reaction or other emergency that can't wait, call 9-1-1 or get to the nearest hospital. Otherwise, call your doctor.  Afterward, the reaction should be reported to the Vaccine Adverse Event Reporting System (VAERS). Your doctor might file this report, or you can do it yourself through the VAERS web site at www.vaers.hhs.gov, or by calling 1-800-822-7967.   VAERS does not give medical advice.  6. How can I learn more?  · Ask your doctor. He or she can give you the vaccine package insert or suggest other sources of information.  · Call your local or state health department.  · Contact the Centers for Disease Control and Prevention (CDC):    Call 1-800-232-4636 (1-800-CDC-INFO) or    Visit CDC's website at www.cdc.gov/vaccines  CDC Pneumococcal Polysaccharide Vaccine VIS (02/01/14)     This information is not intended to replace advice given to you by your health care provider. Make sure you discuss any questions you have with your health care provider.     Document Released: 07/25/2006 Document Revised: 10/18/2014 Document Reviewed: 02/04/2014  Elsevier Interactive Patient Education ©2016 Elsevier Inc.

## 2016-02-16 NOTE — Progress Notes (Signed)
Date:  02/16/2016   Name:  Angela Rose   DOB:  07-29-1946   MRN:  [id number]   Chief Complaint: Diabetes and Thyroid Problem Diabetes She presents for her follow-up diabetic visit. She has type 2 diabetes mellitus. Pertinent negatives for hypoglycemia include no dizziness or tremors. Pertinent negatives for diabetes include no chest pain, no fatigue and no weakness. (Continues in a [facility] study.) Current diabetic treatment includes oral agent (dual therapy) and insulin injections (and a fourth study drug). Her weight is stable. Her home blood glucose trend is decreasing steadily. Her breakfast blood glucose is taken between 6-7 am. Her breakfast blood glucose range is generally 110-130 mg/dl.  Thyroid Problem Presents for follow-up visit. Patient reports no depressed mood, fatigue, heat intolerance, hoarse voice, leg swelling, palpitations, tremors or weight gain. The symptoms have been stable. Past treatments include levothyroxine. The treatment provided significant relief.  Hypertension This is a chronic problem. The current episode started more than 1 year ago. The problem is unchanged. The problem is controlled. Pertinent negatives include no chest pain, palpitations or shortness of breath. Past treatments include ACE inhibitors and diuretics. The current treatment provides significant improvement. Hypertensive end-organ damage includes a thyroid problem.  Meniscus tear and tibial plateau fracture - fell in march at home.  Was in a long brace and now in a short knee brace.  She is continuing to have home physical therapy for 3 more weeks.  No surgery is planned.  She is requesting a handicapped parking placard.  Review of Systems  Constitutional: Negative for fever, chills, weight gain and fatigue.  HENT: Positive for hearing loss. Negative for hoarse voice.   Eyes: Negative for visual disturbance.  Respiratory: Negative for cough, chest tightness and shortness of breath.     Cardiovascular: Negative for chest pain, palpitations and leg swelling.  Endocrine: Negative for heat intolerance.  Genitourinary: Negative for difficulty urinating.  Neurological: Negative for dizziness, tremors, weakness and numbness.  Hematological: Negative for adenopathy.  Psychiatric/Behavioral: Negative for dysphoric mood.    Patient Active Problem List   Diagnosis Date Noted  . Other tear of medial meniscus, current injury, left knee, initial encounter 12/31/2015  . Fracture of tibial plateau 12/31/2015  . Type 2 diabetes mellitus without complication, with long-term current use of insulin ([place]) 08/14/2015  . Hyperlipidemia associated with type 2 diabetes mellitus ([place]) 08/14/2015  . Coronary artery disease involving native coronary artery of native heart without angina pectoris 08/14/2015  . Basal cell carcinoma 08/11/2015  . Melanoma of face ([place]) 08/11/2015  . Major depression in partial remission ([place]) 08/08/2015  . History of colon polyps 08/08/2015  . Hypothyroidism, postablative 08/08/2015  . Carotid artery plaque 09/03/2014  . Essential (primary) hypertension 09/03/2014  . APC (atrial premature contractions) 09/03/2014    Prior to Admission medications   Medication Sig Start Date End Date Taking? Authorizing Provider  amLODipine (NORVASC) 2.5 MG tablet Take 1 tablet (2.5 mg total) by mouth daily. 01/05/16  Yes Glean Hess, MD  aspirin 81 MG tablet Take by mouth.   Yes Historical Provider, MD  enalapril (VASOTEC) 5 MG tablet Take 1 tablet (5 mg total) by mouth 2 (two) times daily. 11/14/15  Yes Glean Hess, MD  FLUoxetine (PROZAC) 40 MG capsule Take 1 capsule (40 mg total) by mouth daily. 11/14/15  Yes Glean Hess, MD  glimepiride (AMARYL) 4 MG tablet Take 1 tablet (4 mg total) by mouth 2 (two) times daily. 11/14/15  Yes  Glean Hess, MD  glucose blood (ONE TOUCH ULTRA TEST) test strip Frequency:PHARMDIR   Dosage:0.0     Instructions:  Note:testing 3-4xqd,  DX Code 250.00 Dose: 1 12/24/08  Yes Historical Provider, MD  hydrochlorothiazide (HYDRODIURIL) 25 MG tablet Take 1 tablet (25 mg total) by mouth daily. 11/14/15  Yes Glean Hess, MD  Insulin Glargine (LANTUS SOLOSTAR) 100 UNIT/ML Solostar Pen Inject into the skin.   Yes Historical Provider, MD  levothyroxine (SYNTHROID, LEVOTHROID) 25 MCG tablet Take 1 tablet (25 mcg total) by mouth daily before breakfast. 11/14/15  Yes Glean Hess, MD  metFORMIN (GLUCOPHAGE-XR) 500 MG 24 hr tablet Take 2 tablets (1,000 mg total) by mouth 2 (two) times daily. 02/06/16  Yes Glean Hess, MD  pravastatin (PRAVACHOL) 40 MG tablet Take 1 tablet (40 mg total) by mouth at bedtime. 11/14/15  Yes Glean Hess, MD    Allergies  Allergen Reactions  . Xylocaine  [Lidocaine Hcl]     Other reaction(s): Other (See Comments) palpitatins and chest pain  . Propoxyphene     Past Surgical History  Procedure Laterality Date  . Coronary artery bypass graft  1996    x 3  . Partial hysterectomy  1976    ovaries remain  . Melanoma excision    . Basal cell carcinoma excision    . Appendectomy      Social History  Substance Use Topics  . Smoking status: Former Research scientist (life sciences)  . Smokeless tobacco: None  . Alcohol Use: No    Medication list has been reviewed and updated.  Physical Exam  Constitutional: She is oriented to person, place, and time. She appears well-developed. No distress.  HENT:  Head: Normocephalic and atraumatic.  Neck: Normal range of motion. Neck supple. No thyromegaly present.  Cardiovascular: Normal rate, regular rhythm and normal heart sounds.   Pulmonary/Chest: Effort normal and breath sounds normal. No respiratory distress. She has no wheezes. She has no rales.  Musculoskeletal: She exhibits no edema.       Left knee: She exhibits no swelling (brace in place) and no effusion.  Lymphadenopathy:    She has no cervical adenopathy.  Neurological: She is alert and oriented to person, place,  and time.  Skin: Skin is warm and dry. No rash noted.  Psychiatric: She has a normal mood and affect. Her behavior is normal. Thought content normal.  Nursing note and vitals reviewed.   BP 128/84 mmHg  Pulse 76  Resp 16  Ht 5\' 6"  (1.676 m)  Wt 186 lb (84.369 kg)  BMI 30.04 kg/m2  SpO2 97%  Assessment and Plan: 1. Type 2 diabetes mellitus without complication, with long-term current use of insulin ([place]) Followed by [facility] Drug study Patient encouraged to get annual eye exam  2. Hypothyroidism, postablative Supplemented - does not want to go to [facility] any longer - Thyroid Panel With TSH  3. Essential (primary) hypertension controlled  4. Other tear of medial meniscus, current injury, left knee, initial encounter Continue brace, PTx and Orthopedic follow up  5. Need for pneumococcal vaccination - Pneumococcal conjugate vaccine 13-valent IM  6. Encounter for screening mammogram for breast cancer - MM DIGITAL SCREENING BILATERAL; Future   Halina Maidens, MD [facility]  02/16/2016

## 2016-02-17 DIAGNOSIS — S83242D Other tear of medial meniscus, current injury, left knee, subsequent encounter: Secondary | ICD-10-CM | POA: Diagnosis not present

## 2016-02-17 DIAGNOSIS — J09X2 Influenza due to identified novel influenza A virus with other respiratory manifestations: Secondary | ICD-10-CM | POA: Diagnosis not present

## 2016-02-17 DIAGNOSIS — S82132D Displaced fracture of medial condyle of left tibia, subsequent encounter for closed fracture with routine healing: Secondary | ICD-10-CM | POA: Diagnosis not present

## 2016-02-17 DIAGNOSIS — I10 Essential (primary) hypertension: Secondary | ICD-10-CM | POA: Diagnosis not present

## 2016-02-17 DIAGNOSIS — F329 Major depressive disorder, single episode, unspecified: Secondary | ICD-10-CM | POA: Diagnosis not present

## 2016-02-17 DIAGNOSIS — E119 Type 2 diabetes mellitus without complications: Secondary | ICD-10-CM | POA: Diagnosis not present

## 2016-02-17 LAB — THYROID PANEL WITH TSH
Free Thyroxine Index: 2.2 (ref 1.2–4.9)
T3 Uptake Ratio: 27 % (ref 24–39)
T4, Total: 8.2 ug/dL (ref 4.5–12.0)
TSH: 3.5 u[IU]/mL (ref 0.450–4.500)

## 2016-02-18 ENCOUNTER — Other Ambulatory Visit: Payer: Self-pay | Admitting: Internal Medicine

## 2016-02-18 MED ORDER — METFORMIN HCL ER 500 MG PO TB24: 1000.0000 mg | ORAL_TABLET | Freq: Two times a day (BID) | ORAL | Status: DC

## 2016-02-19 DIAGNOSIS — I10 Essential (primary) hypertension: Secondary | ICD-10-CM | POA: Diagnosis not present

## 2016-02-19 DIAGNOSIS — S83242D Other tear of medial meniscus, current injury, left knee, subsequent encounter: Secondary | ICD-10-CM | POA: Diagnosis not present

## 2016-02-19 DIAGNOSIS — S82132D Displaced fracture of medial condyle of left tibia, subsequent encounter for closed fracture with routine healing: Secondary | ICD-10-CM | POA: Diagnosis not present

## 2016-02-19 DIAGNOSIS — E119 Type 2 diabetes mellitus without complications: Secondary | ICD-10-CM | POA: Diagnosis not present

## 2016-02-19 DIAGNOSIS — J09X2 Influenza due to identified novel influenza A virus with other respiratory manifestations: Secondary | ICD-10-CM | POA: Diagnosis not present

## 2016-02-19 DIAGNOSIS — F329 Major depressive disorder, single episode, unspecified: Secondary | ICD-10-CM | POA: Diagnosis not present

## 2016-03-24 DIAGNOSIS — M6281 Muscle weakness (generalized): Secondary | ICD-10-CM | POA: Diagnosis not present

## 2016-03-29 DIAGNOSIS — M25562 Pain in left knee: Secondary | ICD-10-CM | POA: Diagnosis not present

## 2016-04-02 DIAGNOSIS — M6281 Muscle weakness (generalized): Secondary | ICD-10-CM | POA: Diagnosis not present

## 2016-04-07 DIAGNOSIS — M6281 Muscle weakness (generalized): Secondary | ICD-10-CM | POA: Diagnosis not present

## 2016-04-09 DIAGNOSIS — M6281 Muscle weakness (generalized): Secondary | ICD-10-CM | POA: Diagnosis not present

## 2016-04-12 DIAGNOSIS — Z08 Encounter for follow-up examination after completed treatment for malignant neoplasm: Secondary | ICD-10-CM | POA: Diagnosis not present

## 2016-04-12 DIAGNOSIS — Z1283 Encounter for screening for malignant neoplasm of skin: Secondary | ICD-10-CM | POA: Diagnosis not present

## 2016-04-12 DIAGNOSIS — L900 Lichen sclerosus et atrophicus: Secondary | ICD-10-CM | POA: Diagnosis not present

## 2016-04-12 DIAGNOSIS — Z85820 Personal history of malignant melanoma of skin: Secondary | ICD-10-CM | POA: Diagnosis not present

## 2016-04-12 DIAGNOSIS — Z85828 Personal history of other malignant neoplasm of skin: Secondary | ICD-10-CM | POA: Diagnosis not present

## 2016-04-12 DIAGNOSIS — Z872 Personal history of diseases of the skin and subcutaneous tissue: Secondary | ICD-10-CM | POA: Diagnosis not present

## 2016-04-21 ENCOUNTER — Other Ambulatory Visit: Payer: Self-pay | Admitting: Internal Medicine

## 2016-04-21 DIAGNOSIS — R29898 Other symptoms and signs involving the musculoskeletal system: Secondary | ICD-10-CM | POA: Diagnosis not present

## 2016-04-23 DIAGNOSIS — R29898 Other symptoms and signs involving the musculoskeletal system: Secondary | ICD-10-CM | POA: Diagnosis not present

## 2016-04-28 ENCOUNTER — Other Ambulatory Visit: Payer: Self-pay | Admitting: Internal Medicine

## 2016-04-28 DIAGNOSIS — R29898 Other symptoms and signs involving the musculoskeletal system: Secondary | ICD-10-CM | POA: Diagnosis not present

## 2016-04-30 DIAGNOSIS — R29898 Other symptoms and signs involving the musculoskeletal system: Secondary | ICD-10-CM | POA: Diagnosis not present

## 2016-06-01 ENCOUNTER — Other Ambulatory Visit: Payer: Self-pay | Admitting: Internal Medicine

## 2016-07-05 DIAGNOSIS — Z872 Personal history of diseases of the skin and subcutaneous tissue: Secondary | ICD-10-CM | POA: Diagnosis not present

## 2016-07-05 DIAGNOSIS — Z08 Encounter for follow-up examination after completed treatment for malignant neoplasm: Secondary | ICD-10-CM | POA: Diagnosis not present

## 2016-07-05 DIAGNOSIS — Z85828 Personal history of other malignant neoplasm of skin: Secondary | ICD-10-CM | POA: Diagnosis not present

## 2016-07-05 DIAGNOSIS — L728 Other follicular cysts of the skin and subcutaneous tissue: Secondary | ICD-10-CM | POA: Diagnosis not present

## 2016-07-05 DIAGNOSIS — Z85820 Personal history of malignant melanoma of skin: Secondary | ICD-10-CM | POA: Diagnosis not present

## 2016-07-05 DIAGNOSIS — Z1283 Encounter for screening for malignant neoplasm of skin: Secondary | ICD-10-CM | POA: Diagnosis not present

## 2016-08-17 ENCOUNTER — Encounter: Payer: Medicare Other | Admitting: Internal Medicine

## 2016-08-31 ENCOUNTER — Ambulatory Visit
Admission: EM | Admit: 2016-08-31 | Discharge: 2016-08-31 | Disposition: A | Discharge status: Home / Self Care | Payer: Commercial Managed Care - HMO | Attending: Family Medicine | Admitting: Family Medicine

## 2016-08-31 DIAGNOSIS — R51 Headache: Secondary | ICD-10-CM | POA: Diagnosis not present

## 2016-08-31 DIAGNOSIS — M5481 Occipital neuralgia: Secondary | ICD-10-CM | POA: Diagnosis not present

## 2016-08-31 DIAGNOSIS — M62838 Other muscle spasm: Secondary | ICD-10-CM | POA: Diagnosis not present

## 2016-08-31 DIAGNOSIS — R519 Headache, unspecified: Secondary | ICD-10-CM

## 2016-08-31 MED ORDER — MELOXICAM 15 MG PO TABS
15.0000 mg | ORAL_TABLET | Freq: Every day | ORAL | 0 refills | Status: DC
Start: 2016-08-31 — End: 2016-08-31

## 2016-08-31 MED ORDER — TIZANIDINE HCL 4 MG PO TABS: 4.0000 mg | ORAL_TABLET | Freq: Three times a day (TID) | ORAL | 0 refills | Status: DC | PRN

## 2016-08-31 MED ORDER — MELOXICAM 15 MG PO TABS: 15.0000 mg | ORAL_TABLET | Freq: Every day | ORAL | 0 refills | Status: DC

## 2016-08-31 NOTE — ED Triage Notes (Signed)
Patient complains of stiff neck and headache x 10 days. Pateint states that the pain has been constant.

## 2016-08-31 NOTE — ED Provider Notes (Signed)
[facility]    CSN: [id number] Arrival date & time: 08/31/16  1502     History   Chief Complaint Chief Complaint  Patient presents with  . Headache    HPI Angela Rose is a 70 y.o. female.   Patient is here because of neck pain This pain started about 10 days ago. She says this she also had the days start some diarrhea. Along with the diarrhea she states that has cleared but now she is sad with passing gas quite a bit and was interested if we had any expertise in that problem. She reports that time now she has pain which moves her head moves her neck as well. Most the pain is in the back of the left side of her head. 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 She will that she denies any fever nausea vomiting this from going on for about 10 days.   The history is provided by the patient. No language interpreter was used.  Headache  Pain location:  Occipital Quality:  Sharp and stabbing Radiates to:  L neck and L shoulder Duration:  10 days Timing:  Intermittent Progression:  Waxing and waning Chronicity:  New Similar to prior headaches: no   Context: activity   Relieved by:  Nothing Associated symptoms: diarrhea and neck stiffness   Associated symptoms: no fever, no focal weakness, no hearing loss, no loss of balance, no near-syncope and no sore throat     History reviewed. No pertinent past medical history.  Patient Active Problem List   Diagnosis Date Noted  . Other tear of medial meniscus, current injury, left knee, initial encounter 12/31/2015  . Fracture of tibial plateau 12/31/2015  . Type 2 diabetes mellitus without complication, with long-term current use of insulin ([place]) 08/14/2015  . Hyperlipidemia associated with type 2 diabetes mellitus ([place]) 08/14/2015  . Coronary artery disease involving native coronary artery of native heart without angina pectoris  08/14/2015  . Basal cell carcinoma 08/11/2015  . Melanoma of face ([place]) 08/11/2015  . Major depression in partial remission ([place]) 08/08/2015  . History of colon polyps 08/08/2015  . Hypothyroidism, postablative 08/08/2015  . Carotid artery plaque 09/03/2014  . Essential (primary) hypertension 09/03/2014  . APC (atrial premature contractions) 09/03/2014    Past Surgical History:  Procedure Laterality Date  . APPENDECTOMY    . BASAL CELL CARCINOMA EXCISION    . CORONARY ARTERY BYPASS GRAFT  1996   x 3  . MELANOMA EXCISION    . PARTIAL HYSTERECTOMY  1976   ovaries remain    OB History    No data available       Home Medications    Prior to Admission medications   Medication Sig Start Date End Date Taking? Authorizing Provider  amLODipine (NORVASC) 2.5 MG tablet TAKE 1 TABLET EVERY DAY 06/01/16  Yes Glean Hess, MD  aspirin 81 MG tablet Take by mouth.   Yes Historical Provider, MD  enalapril (VASOTEC) 5 MG tablet TAKE 1 TABLET TWICE DAILY 04/28/16  Yes Glean Hess, MD  FLUoxetine (PROZAC) 40 MG capsule TAKE 1 CAPSULE EVERY DAY 04/28/16  Yes Glean Hess, MD  glimepiride (AMARYL) 4 MG tablet TAKE 1 TABLET TWICE DAILY 06/01/16  Yes Glean Hess, MD  glucose blood (ONE TOUCH ULTRA TEST) test strip Frequency:PHARMDIR   Dosage:0.0     Instructions:  Note:testing 3-4xqd, DX  Code 250.00 Dose: 1 12/24/08  Yes Historical Provider, MD  hydrochlorothiazide (HYDRODIURIL) 25 MG tablet TAKE 1 TABLET EVERY DAY 04/21/16  Yes Glean Hess, MD  Insulin Glargine (LANTUS SOLOSTAR) 100 UNIT/ML Solostar Pen Inject into the skin.   Yes Historical Provider, MD  levothyroxine (SYNTHROID, LEVOTHROID) 25 MCG tablet TAKE 1 TABLET  DAILY BEFORE BREAKFAST 04/28/16  Yes Glean Hess, MD  metFORMIN (GLUCOPHAGE-XR) 500 MG 24 hr tablet Take 2 tablets (1,000 mg total) by mouth 2 (two) times daily. 02/18/16  Yes Glean Hess, MD  pravastatin (PRAVACHOL) 40 MG tablet TAKE 1 TABLET AT BEDTIME  04/21/16  Yes Glean Hess, MD  meloxicam (MOBIC) 15 MG tablet Take 1 tablet (15 mg total) by mouth daily. 08/31/16   Frederich Cha, MD  tiZANidine (ZANAFLEX) 4 MG tablet Take 1 tablet (4 mg total) by mouth every 8 (eight) hours as needed for muscle spasms. 08/31/16   Frederich Cha, MD    Family History History reviewed. No pertinent family history.  Social History Social History  Substance Use Topics  . Smoking status: Former Research scientist (life sciences)  . Smokeless tobacco: Never Used  . Alcohol use No     Allergies   Xylocaine  [lidocaine hcl] and Propoxyphene   Review of Systems Review of Systems  Constitutional: Negative for fever.  HENT: Negative for hearing loss and sore throat.   Cardiovascular: Negative for near-syncope.  Gastrointestinal: Positive for abdominal distention and diarrhea.       Increase gas  Musculoskeletal: Positive for neck stiffness.  Neurological: Positive for headaches. Negative for focal weakness and loss of balance.  All other systems reviewed and are negative.    Physical Exam Triage Vital Signs ED Triage Vitals  Enc Vitals Group     BP 08/31/16 1529 (!) 159/70     Pulse Rate 08/31/16 1529 74     Resp 08/31/16 1529 18     Temp 08/31/16 1529 98.8 F (37.1 C)     Temp Source 08/31/16 1529 Oral     SpO2 08/31/16 1529 97 %     Weight --      Height 08/31/16 1527 5\' 6"  (1.676 m)     Head Circumference --      Peak Flow --      Pain Score 08/31/16 1529 6     Pain Loc --      Pain Edu? --      Excl. in [place]? --    No data found.   Updated Vital Signs BP (!) 159/70 (BP Location: Left Arm)   Pulse 74   Temp 98.8 F (37.1 C) (Oral)   Resp 18   Ht 5\' 6"  (1.676 m)   SpO2 97%   Visual Acuity Right Eye Distance:   Left Eye Distance:   Bilateral Distance:    Right Eye Near:   Left Eye Near:    Bilateral Near:     Physical Exam  Constitutional: She is oriented to person, place, and time. She appears well-developed and well-nourished. She appears  distressed.  HENT:  Head: Normocephalic and atraumatic.    Right Ear: Hearing, tympanic membrane, external ear and ear canal normal.  Left Ear: Hearing, tympanic membrane, external ear and ear canal normal.  Nose: Nose normal. No rhinorrhea.  Mouth/Throat: Uvula is midline. No dental abscesses. Posterior oropharyngeal erythema present.  Patient has tenderness over the occipital area on the left side and also muscle spasms present in the left trapezius muscle both cervical and  the shoulder muscle bellies  Eyes: Conjunctivae and lids are normal. Pupils are equal, round, and reactive to light. Lids are everted and swept, no foreign bodies found. Right conjunctiva is not injected. Left conjunctiva is not injected.  Neck: Trachea normal. No tracheal tenderness present. No edema present. No thyroid mass and no thyromegaly present.  Pulmonary/Chest: Effort normal.  Musculoskeletal: Normal range of motion. She exhibits tenderness.       Cervical back: She exhibits tenderness and spasm.       Back:  Neurological: She is alert and oriented to person, place, and time.  Skin: Skin is warm.  Psychiatric: She has a normal mood and affect.  Vitals reviewed.    UC Treatments / Results  Labs (all labs ordered are listed, but only abnormal results are displayed) Labs Reviewed - No data to display  EKG  EKG Interpretation None       Radiology No results found.  Procedures .Nerve Block Date/Time: 08/31/2016 5:09 PM Performed by: Frederich Cha Authorized by: Frederich Cha   Consent:    Consent obtained:  Verbal   Consent given by:  Patient Indications:    Indications:  Pain relief Location:    Body area:  Head   Head nerve:  Occipital   Laterality:  Left Pre-procedure details:    Skin preparation:  Povidone-iodine   Preparation: Patient was prepped and draped in usual sterile fashion   Skin anesthesia (see MAR for exact dosages):    Skin anesthesia method:  Topical  application Procedure details (see MAR for exact dosages):    Block needle gauge:  25 G   Anesthetic injected:  Lidocaine 1% w/o epi   Steroid injected:  Dexamethasone   Injection procedure:  Incremental injection, negative aspiration for blood and anatomic landmarks palpated Post-procedure details:    Dressing:  None   Outcome:  Pain improved   Patient tolerance of procedure:  Tolerated well, no immediate complications Comments:     Patient informed she can return Monday if not better. Will treat the muscle spasms with Zanaflex and Mobic. As well   (including critical care time)  Medications Ordered in UC Medications - No data to display   Initial Impression / Assessment and Plan / UC Course  I have reviewed the triage vital signs and the nursing notes.  Pertinent labs & imaging results that were available during my care of the patient were reviewed by me and considered in my medical decision making (see chart for details).  Clinical Course    Patient has some improvement discomfort recommends return Monday if she still has more pain and can try to inject that area again   Final Clinical Impressions(s) / UC Diagnoses   Final diagnoses:  Occipital headache  Occipital neuralgia of left side  Muscle spasms of neck    New Prescriptions New Prescriptions   MELOXICAM (MOBIC) 15 MG TABLET    Take 1 tablet (15 mg total) by mouth daily.   TIZANIDINE (ZANAFLEX) 4 MG TABLET    Take 1 tablet (4 mg total) by mouth every 8 (eight) hours as needed for muscle spasms.     Frederich Cha, MD 08/31/16 [phone]

## 2016-09-08 DIAGNOSIS — H2513 Age-related nuclear cataract, bilateral: Secondary | ICD-10-CM | POA: Diagnosis not present

## 2016-09-08 DIAGNOSIS — H43393 Other vitreous opacities, bilateral: Secondary | ICD-10-CM | POA: Diagnosis not present

## 2016-09-08 DIAGNOSIS — E113293 Type 2 diabetes mellitus with mild nonproliferative diabetic retinopathy without macular edema, bilateral: Secondary | ICD-10-CM | POA: Diagnosis not present

## 2016-09-08 LAB — HM DIABETES EYE EXAM

## 2016-09-22 LAB — LIPID PANEL
Cholesterol: 224 — AB (ref 0–200)
HDL: 48 (ref 35–70)
LDL Cholesterol: 139
TRIGLYCERIDES: 187 — AB (ref 40–160)

## 2016-09-27 ENCOUNTER — Ambulatory Visit (INDEPENDENT_AMBULATORY_CARE_PROVIDER_SITE_OTHER): Payer: Commercial Managed Care - HMO | Admitting: Internal Medicine

## 2016-09-27 ENCOUNTER — Encounter: Payer: Self-pay | Admitting: Internal Medicine

## 2016-09-27 VITALS — BP 118/68 | HR 72 | Temp 98.0°F | Ht 66.0 in | Wt 189.0 lb

## 2016-09-27 DIAGNOSIS — Z23 Encounter for immunization: Secondary | ICD-10-CM | POA: Diagnosis not present

## 2016-09-27 DIAGNOSIS — N3000 Acute cystitis without hematuria: Secondary | ICD-10-CM | POA: Diagnosis not present

## 2016-09-27 DIAGNOSIS — I10 Essential (primary) hypertension: Secondary | ICD-10-CM | POA: Diagnosis not present

## 2016-09-27 LAB — POC URINALYSIS WITH MICROSCOPIC (NON AUTO)MANUAL RESULT
Bilirubin, UA: NEGATIVE
CRYSTALS: 0
Epithelial cells, urine per micros: 0
GLUCOSE UA: NEGATIVE
Ketones, UA: NEGATIVE
Mucus, UA: 0
Nitrite, UA: POSITIVE
RBC: 2 M/uL — AB (ref 4.04–5.48)
Spec Grav, UA: 1.025
Urobilinogen, UA: 0.2
WBC Casts, UA: 15
pH, UA: 6.5

## 2016-09-27 MED ORDER — NITROFURANTOIN MONOHYD MACRO 100 MG PO CAPS: 100.0000 mg | ORAL_CAPSULE | Freq: Two times a day (BID) | ORAL | 0 refills | Status: DC

## 2016-09-27 NOTE — Progress Notes (Signed)
Date:  09/27/2016   Name:  Angela Rose   DOB:  01-04-1946   MRN:  [id number]   Chief Complaint: Urinary Tract Infection Urinary Tract Infection   This is a new problem. The current episode started in the past 7 days. The problem occurs every urination. The problem has been unchanged. The quality of the pain is described as burning. Associated symptoms include chills and frequency. Pertinent negatives include no hematuria or vomiting.  Hypertension  This is a chronic problem. The current episode started more than 1 year ago. The problem is unchanged. The problem is controlled. Pertinent negatives include no chest pain or shortness of breath.      Review of Systems  Constitutional: Positive for chills. Negative for fatigue and unexpected weight change.  Respiratory: Negative for cough, chest tightness and shortness of breath.   Cardiovascular: Negative for chest pain and leg swelling.  Gastrointestinal: Negative for abdominal pain, diarrhea and vomiting.  Genitourinary: Positive for dysuria and frequency. Negative for hematuria.    Patient Active Problem List   Diagnosis Date Noted  . Other tear of medial meniscus, current injury, left knee, initial encounter 12/31/2015  . Fracture of tibial plateau 12/31/2015  . Type 2 diabetes mellitus without complication, with long-term current use of insulin ([place]) 08/14/2015  . Hyperlipidemia associated with type 2 diabetes mellitus ([place]) 08/14/2015  . Coronary artery disease involving native coronary artery of native heart without angina pectoris 08/14/2015  . Basal cell carcinoma 08/11/2015  . Melanoma of face ([place]) 08/11/2015  . Major depression in partial remission ([place]) 08/08/2015  . History of colon polyps 08/08/2015  . Hypothyroidism, postablative 08/08/2015  . Carotid artery plaque 09/03/2014  . Essential (primary) hypertension 09/03/2014  . APC (atrial premature contractions) 09/03/2014    Prior to Admission medications    Medication Sig Start Date End Date Taking? Authorizing Provider  amLODipine (NORVASC) 2.5 MG tablet TAKE 1 TABLET EVERY DAY 06/01/16  Yes Glean Hess, MD  aspirin 81 MG tablet Take by mouth.   Yes Historical Provider, MD  enalapril (VASOTEC) 5 MG tablet TAKE 1 TABLET TWICE DAILY 04/28/16  Yes Glean Hess, MD  FLUoxetine (PROZAC) 40 MG capsule TAKE 1 CAPSULE EVERY DAY 04/28/16  Yes Glean Hess, MD  glimepiride (AMARYL) 4 MG tablet TAKE 1 TABLET TWICE DAILY 06/01/16  Yes Glean Hess, MD  glucose blood (ONE TOUCH ULTRA TEST) test strip Frequency:PHARMDIR   Dosage:0.0     Instructions:  Note:testing 3-4xqd, DX Code 250.00 Dose: 1 12/24/08  Yes Historical Provider, MD  hydrochlorothiazide (HYDRODIURIL) 25 MG tablet TAKE 1 TABLET EVERY DAY 04/21/16  Yes Glean Hess, MD  Insulin Glargine (LANTUS SOLOSTAR) 100 UNIT/ML Solostar Pen Inject into the skin.   Yes Historical Provider, MD  levothyroxine (SYNTHROID, LEVOTHROID) 25 MCG tablet TAKE 1 TABLET  DAILY BEFORE BREAKFAST 04/28/16  Yes Glean Hess, MD  metFORMIN (GLUCOPHAGE-XR) 500 MG 24 hr tablet Take 2 tablets (1,000 mg total) by mouth 2 (two) times daily. 02/18/16  Yes Glean Hess, MD  pravastatin (PRAVACHOL) 40 MG tablet TAKE 1 TABLET AT BEDTIME 04/21/16  Yes Glean Hess, MD  tiZANidine (ZANAFLEX) 4 MG tablet Take 1 tablet (4 mg total) by mouth every 8 (eight) hours as needed for muscle spasms. 08/31/16  Yes Frederich Cha, MD  meloxicam (MOBIC) 15 MG tablet Take 1 tablet (15 mg total) by mouth daily. Patient not taking: Reported on 09/27/2016 08/31/16   Frederich Cha, MD  Allergies  Allergen Reactions  . Other Other (See Comments)    sutures - in her hand-very irritated  . Tape Rash    BANDAIDS. BANDAIDS  . Xylocaine  [Lidocaine Hcl]     Other reaction(s): Other (See Comments) palpitatins and chest pain  . Propoxyphene     Past Surgical History:  Procedure Laterality Date  . APPENDECTOMY    . BASAL CELL  CARCINOMA EXCISION    . CORONARY ARTERY BYPASS GRAFT  1996   x 3  . MELANOMA EXCISION    . PARTIAL HYSTERECTOMY  1976   ovaries remain    Social History  Substance Use Topics  . Smoking status: Former Research scientist (life sciences)  . Smokeless tobacco: Never Used  . Alcohol use No     Medication list has been reviewed and updated.   Physical Exam  Constitutional: She is oriented to person, place, and time. She appears well-developed. No distress.  HENT:  Head: Normocephalic and atraumatic.  Neck: Normal range of motion. Neck supple. No thyromegaly present.  Cardiovascular: Normal rate, regular rhythm and normal heart sounds.   Pulmonary/Chest: Effort normal and breath sounds normal. No respiratory distress. She has no wheezes.  Abdominal: Soft. Bowel sounds are normal. She exhibits no distension. There is tenderness in the suprapubic area. There is no CVA tenderness.  Musculoskeletal: Normal range of motion.  Neurological: She is alert and oriented to person, place, and time. She has normal reflexes.  Skin: Skin is warm and dry. No rash noted.  Psychiatric: She has a normal mood and affect. Her behavior is normal. Thought content normal.  Nursing note and vitals reviewed.   BP 138/72   Pulse 72   Temp 98 F (36.7 C)   Ht 5\' 6"  (1.676 m)   Wt 189 lb (85.7 kg)   SpO2 98%   BMI 30.51 kg/m   Assessment and Plan: 1. Acute cystitis without hematuria - POC urinalysis w microscopic (non auto) - nitrofurantoin, macrocrystal-monohydrate, (MACROBID) 100 MG capsule; Take 1 capsule (100 mg total) by mouth 2 (two) times daily.  Dispense: 14 capsule; Refill: 0  2. Essential (primary) hypertension controlled  3. Encounter for immunization - Flu Vaccine QUAD 36+ mos IM   Halina Maidens, MD [facility]  09/27/2016

## 2016-09-27 NOTE — Patient Instructions (Signed)

## 2016-09-28 ENCOUNTER — Ambulatory Visit: Payer: Commercial Managed Care - HMO | Admitting: Internal Medicine

## 2017-01-04 ENCOUNTER — Other Ambulatory Visit: Payer: Self-pay | Admitting: Internal Medicine

## 2017-01-10 NOTE — Telephone Encounter (Signed)
Informed pt husband. Will call after lunch to make appt.

## 2017-01-18 ENCOUNTER — Encounter: Payer: Self-pay | Admitting: Internal Medicine

## 2017-01-18 ENCOUNTER — Ambulatory Visit (INDEPENDENT_AMBULATORY_CARE_PROVIDER_SITE_OTHER): Payer: Medicare HMO | Admitting: Internal Medicine

## 2017-01-18 VITALS — BP 126/78 | HR 70 | Ht 68.0 in | Wt 188.0 lb

## 2017-01-18 DIAGNOSIS — F3341 Major depressive disorder, recurrent, in partial remission: Secondary | ICD-10-CM

## 2017-01-18 DIAGNOSIS — E113299 Type 2 diabetes mellitus with mild nonproliferative diabetic retinopathy without macular edema, unspecified eye: Secondary | ICD-10-CM | POA: Diagnosis not present

## 2017-01-18 DIAGNOSIS — E113293 Type 2 diabetes mellitus with mild nonproliferative diabetic retinopathy without macular edema, bilateral: Secondary | ICD-10-CM

## 2017-01-18 DIAGNOSIS — I10 Essential (primary) hypertension: Secondary | ICD-10-CM | POA: Diagnosis not present

## 2017-01-18 DIAGNOSIS — E890 Postprocedural hypothyroidism: Secondary | ICD-10-CM | POA: Diagnosis not present

## 2017-01-18 MED ORDER — INSULIN PEN NEEDLE 32G X 4 MM MISC: 1.0000 | Freq: Every day | 3 refills | Status: DC

## 2017-01-18 NOTE — Patient Instructions (Signed)
Stop Glimepiride.

## 2017-01-18 NOTE — Progress Notes (Signed)
Date:  01/18/2017   Name:  Angela Rose   DOB:  1946-02-13   MRN:  [id number]   Chief Complaint: Hypertension; Diabetes (This morning- 106); and Hypothyroidism Hypertension  This is a chronic problem. The problem is unchanged. The problem is controlled. Pertinent negatives include no chest pain, headaches, palpitations or shortness of breath. Hypertensive end-organ damage includes retinopathy. Identifiable causes of hypertension include a thyroid problem.  Diabetes  She has type 2 diabetes mellitus. Pertinent negatives for hypoglycemia include no headaches or tremors. Pertinent negatives for diabetes include no chest pain, no fatigue, no polydipsia and no polyuria. Diabetic complications include retinopathy. (Followed at [facility] in a drug study )  Thyroid Problem  Presents for follow-up visit. Patient reports no fatigue, palpitations or tremors. The symptoms have been stable. Her past medical history is significant for diabetes and hyperlipidemia.  Hyperlipidemia  This is a chronic problem. Exacerbating diseases include diabetes. Pertinent negatives include no chest pain or shortness of breath. Current antihyperlipidemic treatment includes statins.  Depression         This is a chronic problem.  The onset quality is undetermined. The problem is unchanged.  Associated symptoms include no fatigue, no appetite change and no headaches.  Past treatments include SSRIs - Selective serotonin reuptake inhibitors.  Past medical history includes thyroid problem.     Lab Results  Component Value Date   TSH 3.500 02/16/2016     Review of Systems  Constitutional: Negative for appetite change, fatigue, fever and unexpected weight change.  HENT: Negative for tinnitus and trouble swallowing.   Eyes: Negative for visual disturbance.  Respiratory: Negative for cough, chest tightness and shortness of breath.   Cardiovascular: Negative for chest pain, palpitations and leg swelling.  Gastrointestinal:  Negative for abdominal pain.  Endocrine: Negative for polydipsia and polyuria.  Genitourinary: Negative for dysuria and hematuria.  Musculoskeletal: Negative for arthralgias.  Neurological: Negative for tremors, numbness and headaches.  Psychiatric/Behavioral: Positive for depression and dysphoric mood. Negative for sleep disturbance.    Patient Active Problem List   Diagnosis Date Noted  . Other tear of medial meniscus, current injury, left knee, initial encounter 12/31/2015  . Fracture of tibial plateau 12/31/2015  . DM type 2 with diabetic background retinopathy ([place]) 08/14/2015  . Hyperlipidemia associated with type 2 diabetes mellitus ([place]) 08/14/2015  . Coronary artery disease involving native coronary artery of native heart without angina pectoris 08/14/2015  . Basal cell carcinoma 08/11/2015  . Melanoma of face ([place]) 08/11/2015  . Major depression in partial remission ([place]) 08/08/2015  . Non-proliferative diabetic retinopathy, both eyes ([place]) 08/08/2015  . History of colon polyps 08/08/2015  . Hypothyroidism, postablative 08/08/2015  . Carotid artery plaque 09/03/2014  . Essential (primary) hypertension 09/03/2014  . APC (atrial premature contractions) 09/03/2014    Prior to Admission medications   Medication Sig Start Date End Date Taking? Authorizing Provider  amLODipine (NORVASC) 2.5 MG tablet TAKE 1 TABLET EVERY DAY 06/01/16   Glean Hess, MD  aspirin 81 MG tablet Take by mouth.    Historical Provider, MD  enalapril (VASOTEC) 5 MG tablet TAKE 1 TABLET TWICE DAILY 04/28/16   Glean Hess, MD  FLUoxetine (PROZAC) 40 MG capsule TAKE 1 CAPSULE EVERY DAY 04/28/16   Glean Hess, MD  glimepiride (AMARYL) 4 MG tablet TAKE 1 TABLET TWICE DAILY 06/01/16   Glean Hess, MD  glucose blood (ONE TOUCH ULTRA TEST) test strip Frequency:PHARMDIR   Dosage:0.0  Instructions:  Note:testing 3-4xqd, DX Code 250.00 Dose: 1 12/24/08   Historical Provider, MD  hydrochlorothiazide  (HYDRODIURIL) 25 MG tablet TAKE 1 TABLET EVERY DAY 01/05/17   Glean Hess, MD  Insulin Glargine (LANTUS SOLOSTAR) 100 UNIT/ML Solostar Pen Inject into the skin.    Historical Provider, MD  levothyroxine (SYNTHROID, LEVOTHROID) 25 MCG tablet TAKE 1 TABLET  DAILY BEFORE BREAKFAST 04/28/16   Glean Hess, MD  meloxicam (MOBIC) 15 MG tablet Take 1 tablet (15 mg total) by mouth daily. Patient not taking: Reported on 09/27/2016 08/31/16   Frederich Cha, MD  metFORMIN (GLUCOPHAGE-XR) 500 MG 24 hr tablet Take 2 tablets (1,000 mg total) by mouth 2 (two) times daily. 02/18/16   Glean Hess, MD  nitrofurantoin, macrocrystal-monohydrate, (MACROBID) 100 MG capsule Take 1 capsule (100 mg total) by mouth 2 (two) times daily. 09/27/16   Glean Hess, MD  pravastatin (PRAVACHOL) 40 MG tablet TAKE 1 TABLET AT BEDTIME 04/21/16   Glean Hess, MD  tiZANidine (ZANAFLEX) 4 MG tablet Take 1 tablet (4 mg total) by mouth every 8 (eight) hours as needed for muscle spasms. 08/31/16   Frederich Cha, MD    Allergies  Allergen Reactions  . Other Other (See Comments)    sutures - in her hand-very irritated  . Tape Rash    BANDAIDS. BANDAIDS  . Xylocaine  [Lidocaine Hcl]     Other reaction(s): Other (See Comments) palpitatins and chest pain  . Propoxyphene     Past Surgical History:  Procedure Laterality Date  . APPENDECTOMY    . BASAL CELL CARCINOMA EXCISION    . CORONARY ARTERY BYPASS GRAFT  1996   x 3  . MELANOMA EXCISION    . PARTIAL HYSTERECTOMY  1976   ovaries remain    Social History  Substance Use Topics  . Smoking status: Former Research scientist (life sciences)  . Smokeless tobacco: Never Used  . Alcohol use No     Medication list has been reviewed and updated.   Physical Exam  Constitutional: She is oriented to person, place, and time. She appears well-developed. No distress.  HENT:  Head: Normocephalic and atraumatic.  Cardiovascular: Normal rate, regular rhythm and normal heart sounds.     Pulmonary/Chest: Effort normal and breath sounds normal. No respiratory distress. She has no wheezes.  Musculoskeletal: Normal range of motion.  Neurological: She is alert and oriented to person, place, and time.  Skin: Skin is warm and dry. No rash noted.  Psychiatric: She has a normal mood and affect. Her behavior is normal. Thought content normal.  Nursing note and vitals reviewed.   BP 126/78   Pulse 70   Ht 5\' 8"  (1.727 m)   Wt 188 lb (85.3 kg)   SpO2 98%   BMI 28.59 kg/m   Assessment and Plan: 1. Hypothyroidism, postablative Continue supplementation - TSH  2. Essential (primary) hypertension controlled - Basic metabolic panel  3. DM type 2 with diabetic background retinopathy ([place]) Continue with drug study Stop glimepiride - Hemoglobin A1c  4. Non-proliferative diabetic retinopathy, both eyes ([place]) stable  5. Recurrent major depressive disorder, in partial remission ([place]) Continue Prozac    Meds ordered this encounter  Medications  . Insulin Pen Needle (BD PEN NEEDLE NANO U/F) 32G X 4 MM MISC    Sig: 1 each by Does not apply route daily.    Dispense:  100 each    Refill:  [place], MD [facility]  01/18/2017

## 2017-01-19 LAB — BASIC METABOLIC PANEL WITH GFR
BUN/Creatinine Ratio: 25 (ref 12–28)
BUN: 25 mg/dL (ref 8–27)
CO2: 21 mmol/L (ref 18–29)
Calcium: 10 mg/dL (ref 8.7–10.3)
Chloride: 99 mmol/L (ref 96–106)
Creatinine, Ser: 1.02 mg/dL — ABNORMAL HIGH (ref 0.57–1.00)
GFR calc Af Amer: 63 mL/min/{1.73_m2}
GFR calc non Af Amer: 55 mL/min/{1.73_m2} — ABNORMAL LOW
Glucose: 110 mg/dL — ABNORMAL HIGH (ref 65–99)
Potassium: 5 mmol/L (ref 3.5–5.2)
Sodium: 143 mmol/L (ref 134–144)

## 2017-01-19 LAB — HEMOGLOBIN A1C
ESTIMATED AVERAGE GLUCOSE: 220 mg/dL
HEMOGLOBIN A1C: 9.3 % — AB (ref 4.8–5.6)

## 2017-01-19 LAB — TSH: TSH: 3.51 u[IU]/mL (ref 0.450–4.500)

## 2017-02-09 ENCOUNTER — Other Ambulatory Visit: Payer: Self-pay | Admitting: Internal Medicine

## 2017-03-09 ENCOUNTER — Other Ambulatory Visit: Payer: Self-pay | Admitting: Internal Medicine

## 2017-03-16 ENCOUNTER — Encounter: Payer: Self-pay | Admitting: Internal Medicine

## 2017-03-16 ENCOUNTER — Ambulatory Visit (INDEPENDENT_AMBULATORY_CARE_PROVIDER_SITE_OTHER): Payer: Medicare HMO | Admitting: Internal Medicine

## 2017-03-16 VITALS — BP 124/62 | HR 64 | Ht 68.0 in | Wt 182.0 lb

## 2017-03-16 DIAGNOSIS — L0390 Cellulitis, unspecified: Secondary | ICD-10-CM

## 2017-03-16 MED ORDER — AMOXICILLIN-POT CLAVULANATE 875-125 MG PO TABS: 1.0000 | ORAL_TABLET | Freq: Two times a day (BID) | ORAL | 0 refills | Status: DC

## 2017-03-16 NOTE — Progress Notes (Signed)
Date:  03/16/2017   Name:  Angela Rose   DOB:  November 30, 1945   MRN:  [id number]   Chief Complaint: redness (Clipping toenails 2 weeks ago and acciently clipped skin and had a little blood. Was given augmentin for 7 days from diabetic clinic at [facility] and has not gotten any better. ) Wound Check  She was originally treated more than 14 days ago. Previous treatment included oral antibiotics. There has been no drainage from the wound. The redness has not changed. There is no swelling present. There is no pain present.     Review of Systems  Constitutional: Positive for unexpected weight change (working hard on diet and has lost 10 lbs). Negative for chills, fatigue and fever.  Respiratory: Negative for cough, chest tightness and wheezing.   Cardiovascular: Negative for chest pain and palpitations.  Skin: Positive for color change and wound.    Patient Active Problem List   Diagnosis Date Noted  . Other tear of medial meniscus, current injury, left knee, initial encounter 12/31/2015  . Fracture of tibial plateau 12/31/2015  . DM type 2 with diabetic background retinopathy ([place]) 08/14/2015  . Hyperlipidemia associated with type 2 diabetes mellitus ([place]) 08/14/2015  . Coronary artery disease involving native coronary artery of native heart without angina pectoris 08/14/2015  . Basal cell carcinoma 08/11/2015  . Melanoma of face ([place]) 08/11/2015  . Major depression in partial remission ([place]) 08/08/2015  . Non-proliferative diabetic retinopathy, both eyes ([place]) 08/08/2015  . History of colon polyps 08/08/2015  . Hypothyroidism, postablative 08/08/2015  . Carotid artery plaque 09/03/2014  . Essential (primary) hypertension 09/03/2014  . APC (atrial premature contractions) 09/03/2014    Prior to Admission medications   Medication Sig Start Date End Date Taking? Authorizing Provider  amLODipine (NORVASC) 2.5 MG tablet TAKE 1 TABLET EVERY DAY 02/10/17   Glean Hess, MD  aspirin 81  MG tablet Take by mouth.    [provider]  enalapril (VASOTEC) 5 MG tablet TAKE 1 TABLET TWICE DAILY 02/10/17   Glean Hess, MD  FLUoxetine (PROZAC) 40 MG capsule TAKE 1 CAPSULE EVERY DAY 04/28/16   Glean Hess, MD  glimepiride (AMARYL) 4 MG tablet TAKE 1 TABLET TWICE DAILY 06/01/16   Glean Hess, MD  glucose blood (ONE TOUCH ULTRA TEST) test strip Frequency:PHARMDIR   Dosage:0.0     Instructions:  Note:testing 3-4xqd, DX Code 250.00 Dose: 1 12/24/08   [provider]  hydrochlorothiazide (HYDRODIURIL) 25 MG tablet TAKE 1 TABLET EVERY DAY 03/10/17   Glean Hess, MD  Insulin Glargine (LANTUS SOLOSTAR) 100 UNIT/ML Solostar Pen Inject 40 Units into the skin.     [provider]  Insulin Pen Needle (BD PEN NEEDLE NANO U/F) 32G X 4 MM MISC 1 each by Does not apply route daily. 01/18/17   Glean Hess, MD  levothyroxine (SYNTHROID, LEVOTHROID) 25 MCG tablet TAKE 1 TABLET  DAILY BEFORE BREAKFAST 02/10/17   Glean Hess, MD  meloxicam (MOBIC) 15 MG tablet Take 1 tablet (15 mg total) by mouth daily. 08/31/16   Frederich Cha, MD  metFORMIN (GLUCOPHAGE-XR) 500 MG 24 hr tablet Take 2 tablets (1,000 mg total) by mouth 2 (two) times daily. 02/18/16   Glean Hess, MD  pravastatin (PRAVACHOL) 40 MG tablet TAKE 1 TABLET AT BEDTIME 04/21/16   Glean Hess, MD    Allergies  Allergen Reactions  . Other Other (See Comments)    sutures - in her  hand-very irritated  . Tape Rash    BANDAIDS. BANDAIDS  . Xylocaine  [Lidocaine Hcl]     Other reaction(s): Other (See Comments) palpitatins and chest pain  . Propoxyphene     Past Surgical History:  Procedure Laterality Date  . APPENDECTOMY    . BASAL CELL CARCINOMA EXCISION    . CORONARY ARTERY BYPASS GRAFT  1996   x 3  . MELANOMA EXCISION    . PARTIAL HYSTERECTOMY  1976   ovaries remain    Social History  Substance Use Topics  . Smoking status: Former Research scientist (life sciences)  . Smokeless tobacco: Never Used  .  Alcohol use No     Medication list has been reviewed and updated.   Physical Exam  Constitutional: She is oriented to person, place, and time. She appears well-developed. No distress.  HENT:  Head: Normocephalic and atraumatic.  Cardiovascular: Normal rate, regular rhythm and normal heart sounds.   Pulmonary/Chest: Effort normal and breath sounds normal. No respiratory distress.  Musculoskeletal: Normal range of motion.       Feet:  Neurological: She is alert and oriented to person, place, and time.  Skin: Skin is warm and dry. No rash noted. There is erythema.  Psychiatric: She has a normal mood and affect. Her behavior is normal. Thought content normal.  Nursing note and vitals reviewed.   BP 124/62   Pulse 64   Ht 5\' 8"  (1.727 m)   Wt 182 lb (82.6 kg)   SpO2 97%   BMI 27.67 kg/m   Assessment and Plan: 1. Cellulitis, unspecified cellulitis site - amoxicillin-clavulanate (AUGMENTIN) 875-125 MG tablet; Take 1 tablet by mouth 2 (two) times daily.  Dispense: 14 tablet; Refill: 0   Meds ordered this encounter  Medications  . amoxicillin-clavulanate (AUGMENTIN) 875-125 MG tablet    Sig: Take 1 tablet by mouth 2 (two) times daily.    Dispense:  14 tablet    Refill:  0    Halina Maidens, MD [facility]  03/16/2017

## 2017-04-05 ENCOUNTER — Other Ambulatory Visit: Payer: Self-pay | Admitting: Internal Medicine

## 2017-04-06 DIAGNOSIS — E11621 Type 2 diabetes mellitus with foot ulcer: Secondary | ICD-10-CM | POA: Diagnosis not present

## 2017-04-06 DIAGNOSIS — L97519 Non-pressure chronic ulcer of other part of right foot with unspecified severity: Secondary | ICD-10-CM | POA: Diagnosis not present

## 2017-04-06 DIAGNOSIS — L03031 Cellulitis of right toe: Secondary | ICD-10-CM | POA: Diagnosis not present

## 2017-04-18 ENCOUNTER — Other Ambulatory Visit: Payer: Self-pay | Admitting: Internal Medicine

## 2017-04-20 DIAGNOSIS — L03031 Cellulitis of right toe: Secondary | ICD-10-CM | POA: Diagnosis not present

## 2017-04-20 DIAGNOSIS — L97519 Non-pressure chronic ulcer of other part of right foot with unspecified severity: Secondary | ICD-10-CM | POA: Diagnosis not present

## 2017-04-20 DIAGNOSIS — E11621 Type 2 diabetes mellitus with foot ulcer: Secondary | ICD-10-CM | POA: Diagnosis not present

## 2017-05-11 ENCOUNTER — Other Ambulatory Visit: Payer: Self-pay | Admitting: Internal Medicine

## 2017-05-16 ENCOUNTER — Telehealth: Payer: Self-pay | Admitting: Internal Medicine

## 2017-05-16 ENCOUNTER — Ambulatory Visit: Payer: Medicare HMO

## 2017-05-16 NOTE — Telephone Encounter (Signed)
Called pt to re- schedule for Annual Wellness Visit with Nurse Health Advisor, Tiffany Hill, my c/b # is [phone]  Kathryn Brown ° °

## 2017-05-17 ENCOUNTER — Ambulatory Visit: Payer: Medicare HMO | Admitting: Internal Medicine

## 2017-05-20 ENCOUNTER — Ambulatory Visit (INDEPENDENT_AMBULATORY_CARE_PROVIDER_SITE_OTHER): Payer: Medicare HMO | Admitting: Internal Medicine

## 2017-05-20 VITALS — BP 126/74 | HR 61 | Temp 97.8°F | Ht 68.0 in | Wt 176.8 lb

## 2017-05-20 DIAGNOSIS — E113299 Type 2 diabetes mellitus with mild nonproliferative diabetic retinopathy without macular edema, unspecified eye: Secondary | ICD-10-CM | POA: Diagnosis not present

## 2017-05-20 DIAGNOSIS — E785 Hyperlipidemia, unspecified: Secondary | ICD-10-CM | POA: Diagnosis not present

## 2017-05-20 DIAGNOSIS — E1169 Type 2 diabetes mellitus with other specified complication: Secondary | ICD-10-CM | POA: Diagnosis not present

## 2017-05-20 DIAGNOSIS — I10 Essential (primary) hypertension: Secondary | ICD-10-CM

## 2017-05-20 DIAGNOSIS — M6530 Trigger finger, unspecified finger: Secondary | ICD-10-CM | POA: Diagnosis not present

## 2017-05-20 DIAGNOSIS — E890 Postprocedural hypothyroidism: Secondary | ICD-10-CM

## 2017-05-20 DIAGNOSIS — Z1231 Encounter for screening mammogram for malignant neoplasm of breast: Secondary | ICD-10-CM

## 2017-05-20 DIAGNOSIS — IMO0002 Reserved for concepts with insufficient information to code with codable children: Secondary | ICD-10-CM

## 2017-05-20 MED ORDER — INSULIN DETEMIR 100 UNIT/ML FLEXPEN: 40.0000 [IU] | PEN_INJECTOR | Freq: Every day | SUBCUTANEOUS | 3 refills | Status: DC

## 2017-05-20 MED ORDER — HYDROCHLOROTHIAZIDE 25 MG PO TABS: 25.0000 mg | ORAL_TABLET | Freq: Every day | ORAL | 3 refills | Status: DC

## 2017-05-20 NOTE — Progress Notes (Signed)
Date:  05/20/2017   Name:  Angela Rose   DOB:  [birth year]   MRN:  [id number]   Chief Complaint: Diabetes; Hypertension; and Hyperlipidemia Diabetes  She presents for her follow-up diabetic visit. She has type 2 diabetes mellitus. Pertinent negatives for hypoglycemia include no headaches or tremors. Pertinent negatives for diabetes include no chest pain, no fatigue, no polydipsia and no polyuria. Current diabetic treatments: metformin, insulin, and study drug vs placebo Bexagliflozin. She is compliant with treatment all of the time. An ACE inhibitor/angiotensin II receptor blocker is being taken.  Hypertension  This is a chronic problem. The problem is controlled. Pertinent negatives include no chest pain, headaches, palpitations or shortness of breath. Past treatments include ACE inhibitors, calcium channel blockers and diuretics.  Hyperlipidemia  This is a chronic problem. Pertinent negatives include no chest pain or shortness of breath. Current antihyperlipidemic treatment includes statins.  Trigger finger - 4th finger on left hand and both thumbs- ready to have this addressed.  Lab Results  Component Value Date   HGBA1C 9.3 (H) 01/18/2017   Lab Results  Component Value Date   CREATININE 1.02 (H) 01/18/2017   Lab Results  Component Value Date   CHOL 224 (A) 09/22/2016   HDL 48 09/22/2016   LDLCALC 139 09/22/2016   TRIG 187 (A) 09/22/2016     Review of Systems  Constitutional: Positive for unexpected weight change (has lost about 10 lbs with effort). Negative for appetite change, fatigue and fever.  HENT: Negative for tinnitus and trouble swallowing.   Eyes: Negative for visual disturbance.  Respiratory: Negative for cough, chest tightness and shortness of breath.   Cardiovascular: Negative for chest pain, palpitations and leg swelling.  Gastrointestinal: Negative for abdominal pain.  Endocrine: Negative for polydipsia and polyuria.  Genitourinary: Negative for  dysuria and hematuria.  Musculoskeletal: Positive for joint swelling (trigger fingers). Negative for arthralgias.  Neurological: Negative for tremors, numbness and headaches.  Psychiatric/Behavioral: Negative for dysphoric mood and sleep disturbance.    Patient Active Problem List   Diagnosis Date Noted  . Other tear of medial meniscus, current injury, left knee, initial encounter 12/31/2015  . Fracture of tibial plateau 12/31/2015  . DM type 2 with diabetic background retinopathy ([place]) 08/14/2015  . Hyperlipidemia associated with type 2 diabetes mellitus ([place]) 08/14/2015  . Coronary artery disease involving native coronary artery of native heart without angina pectoris 08/14/2015  . Basal cell carcinoma 08/11/2015  . Melanoma of face ([place]) 08/11/2015  . Major depression in partial remission ([place]) 08/08/2015  . Non-proliferative diabetic retinopathy, both eyes ([place]) 08/08/2015  . History of colon polyps 08/08/2015  . Hypothyroidism, postablative 08/08/2015  . Carotid artery plaque 09/03/2014  . Essential (primary) hypertension 09/03/2014  . APC (atrial premature contractions) 09/03/2014    Prior to Admission medications   Medication Sig Start Date End Date Taking? Authorizing Provider  amLODipine (NORVASC) 2.5 MG tablet TAKE 1 TABLET EVERY DAY 02/10/17   Glean Hess, MD  aspirin 81 MG tablet Take by mouth.    [provider]  enalapril (VASOTEC) 5 MG tablet TAKE 1 TABLET TWICE DAILY 02/10/17   Glean Hess, MD  FLUoxetine (PROZAC) 40 MG capsule TAKE 1 CAPSULE EVERY DAY 04/05/17   Glean Hess, MD  glimepiride (AMARYL) 4 MG tablet TAKE 1 TABLET TWICE DAILY 05/12/17   Glean Hess, MD  glucose blood (ONE TOUCH ULTRA TEST) test strip Frequency:PHARMDIR   Dosage:0.0     Instructions:  Note:testing  3-4xqd, DX Code 250.00 Dose: 1 12/24/08   [provider]  hydrochlorothiazide (HYDRODIURIL) 25 MG tablet TAKE 1 TABLET EVERY DAY 05/12/17   Glean Hess, MD    Insulin Glargine (LANTUS SOLOSTAR) 100 UNIT/ML Solostar Pen Inject 40 Units into the skin.     [provider]  Insulin Pen Needle (BD PEN NEEDLE NANO U/F) 32G X 4 MM MISC 1 each by Does not apply route daily. 01/18/17   Glean Hess, MD  levothyroxine (SYNTHROID, LEVOTHROID) 25 MCG tablet TAKE 1 TABLET  DAILY BEFORE BREAKFAST 02/10/17   Glean Hess, MD  meloxicam (MOBIC) 15 MG tablet Take 1 tablet (15 mg total) by mouth daily. 08/31/16   Frederich Cha, MD  metFORMIN (GLUCOPHAGE-XR) 500 MG 24 hr tablet TAKE 2 TABLETS TWICE DAILY 04/19/17   Glean Hess, MD  pravastatin (PRAVACHOL) 40 MG tablet TAKE 1 TABLET AT BEDTIME 04/05/17   Glean Hess, MD    Allergies  Allergen Reactions  . Other Other (See Comments)    sutures - in her hand-very irritated  . Tape Rash    BANDAIDS. BANDAIDS  . Xylocaine  [Lidocaine Hcl]     Other reaction(s): Other (See Comments) palpitatins and chest pain  . Propoxyphene     Past Surgical History:  Procedure Laterality Date  . APPENDECTOMY    . BASAL CELL CARCINOMA EXCISION    . CORONARY ARTERY BYPASS GRAFT  1996   x 3  . MELANOMA EXCISION    . PARTIAL HYSTERECTOMY  1976   ovaries remain    Social History  Substance Use Topics  . Smoking status: Former Research scientist (life sciences)  . Smokeless tobacco: Never Used  . Alcohol use No     Medication list has been reviewed and updated.   Physical Exam  Constitutional: She is oriented to person, place, and time. She appears well-developed. No distress.  HENT:  Head: Normocephalic and atraumatic.  Neck: Normal range of motion. Neck supple. Carotid bruit is not present. No thyromegaly present.  Cardiovascular: Normal rate, regular rhythm and normal heart sounds.   Pulmonary/Chest: Effort normal and breath sounds normal. No respiratory distress. She has no wheezes.  Abdominal: Soft. Normal appearance and bowel sounds are normal.  Musculoskeletal: Normal range of motion.  Trigger finger left 4th  finger with palmar thickening at the bases of both thumbs as well  Neurological: She is alert and oriented to person, place, and time. No cranial nerve deficit or sensory deficit.  Skin: Skin is warm and dry. No rash noted.  Psychiatric: She has a normal mood and affect. Her speech is normal and behavior is normal. Thought content normal.  Nursing note and vitals reviewed.   BP 126/74 (BP Location: Left Arm, Patient Position: Sitting, Cuff Size: Normal)   Pulse 61   Temp 97.8 F (36.6 C) (Oral)   Ht 5\' 8"  (1.727 m)   Wt 176 lb 12.8 oz (80.2 kg)   SpO2 96%   BMI 26.88 kg/m   Assessment and Plan: 1. Essential (primary) hypertension controlled - hydrochlorothiazide (HYDRODIURIL) 25 MG tablet; Take 1 tablet (25 mg total) by mouth daily.  Dispense: 90 tablet; Refill: 3  2. DM type 2 with diabetic background retinopathy ([place]) Should be improved; pt wants to wean down insulin if possible - Hemoglobin R4W - Basic metabolic panel - Insulin Detemir (LEVEMIR FLEXTOUCH) 100 UNIT/ML Pen; Inject 40 Units into the skin daily at 10 pm.  Dispense: 45 mL; Refill: 3  3. Hyperlipidemia associated  with type 2 diabetes mellitus ([place]) On statin therapy  4. Hypothyroidism, postablative supplemented - TSH  5. Trigger finger of both hands refer - Ambulatory referral to Orthopedic Surgery  6. Encounter for screening mammogram for breast cancer - MM DIGITAL SCREENING BILATERAL; Future   Meds ordered this encounter  Medications  . Insulin Detemir (LEVEMIR FLEXTOUCH) 100 UNIT/ML Pen    Sig: Inject 40 Units into the skin daily at 10 pm.    Dispense:  45 mL    Refill:  3  . hydrochlorothiazide (HYDRODIURIL) 25 MG tablet    Sig: Take 1 tablet (25 mg total) by mouth daily.    Dispense:  90 tablet    Refill:  [place], MD [facility]  05/20/2017

## 2017-05-21 LAB — BASIC METABOLIC PANEL
BUN / CREAT RATIO: 18 (ref 12–28)
BUN: 23 mg/dL (ref 8–27)
CO2: 25 mmol/L (ref 20–29)
CREATININE: 1.26 mg/dL — AB (ref 0.57–1.00)
Calcium: 9.6 mg/dL (ref 8.7–10.3)
Chloride: 99 mmol/L (ref 96–106)
GFR calc Af Amer: 49 mL/min/{1.73_m2} — ABNORMAL LOW (ref >59)
GFR calc non Af Amer: 42 mL/min/{1.73_m2} — ABNORMAL LOW (ref >59)
GLUCOSE: 239 mg/dL — AB (ref 65–99)
Potassium: 4.5 mmol/L (ref 3.5–5.2)
SODIUM: 142 mmol/L (ref 134–144)

## 2017-05-21 LAB — HEMOGLOBIN A1C
ESTIMATED AVERAGE GLUCOSE: 183 mg/dL
HEMOGLOBIN A1C: 8 % — AB (ref 4.8–5.6)

## 2017-05-21 LAB — TSH: TSH: 4.06 u[IU]/mL (ref 0.450–4.500)

## 2017-06-02 ENCOUNTER — Encounter: Payer: Self-pay | Admitting: Internal Medicine

## 2017-06-20 ENCOUNTER — Ambulatory Visit (INDEPENDENT_AMBULATORY_CARE_PROVIDER_SITE_OTHER): Payer: Medicare HMO

## 2017-06-20 VITALS — BP 150/78 | HR 62 | Temp 98.2°F | Resp 16 | Ht 68.0 in | Wt 175.8 lb

## 2017-06-20 DIAGNOSIS — Z23 Encounter for immunization: Secondary | ICD-10-CM

## 2017-06-20 DIAGNOSIS — Z0000 Encounter for general adult medical examination without abnormal findings: Secondary | ICD-10-CM | POA: Diagnosis not present

## 2017-06-20 NOTE — Progress Notes (Signed)
Subjective:   Angela Rose is a 71 y.o. female who presents for Medicare Annual (Subsequent) preventive examination.  Review of Systems:  Cardiac Risk Factors include: advanced age (>36men, >86 women);diabetes mellitus;dyslipidemia     Objective:     Vitals: BP (!) 150/78 (BP Location: Left Arm, Patient Position: Sitting)   Pulse 62   Temp 98.2 F (36.8 C)   Resp 16   Ht 5\' 8"  (1.727 m)   Wt 175 lb 12.8 oz (79.7 kg)   BMI 26.73 kg/m   Body mass index is 26.73 kg/m.   Tobacco History  Smoking Status  . Former Smoker  . Quit date: 06/11/1982  Smokeless Tobacco  . Never Used     Counseling given: Not Answered   Past Medical History:  Diagnosis Date  . Diabetes mellitus without complication ([place])   . Hyperlipidemia   . Hypertension    Past Surgical History:  Procedure Laterality Date  . APPENDECTOMY    . BASAL CELL CARCINOMA EXCISION    . CORONARY ARTERY BYPASS GRAFT  1996   x 3  . MELANOMA EXCISION    . PARTIAL HYSTERECTOMY  1976   ovaries remain   History reviewed. No pertinent family history. History  Sexual Activity  . Sexual activity: Not on file    Outpatient Encounter Prescriptions as of 06/20/2017  Medication Sig  . amLODipine (NORVASC) 2.5 MG tablet TAKE 1 TABLET EVERY DAY  . aspirin 81 MG tablet Take by mouth.  . enalapril (VASOTEC) 5 MG tablet TAKE 1 TABLET TWICE DAILY  . FLUoxetine (PROZAC) 40 MG capsule TAKE 1 CAPSULE EVERY DAY  . glucose blood (ONE TOUCH ULTRA TEST) test strip Frequency:PHARMDIR   Dosage:0.0     Instructions:  Note:testing 3-4xqd, DX Code 250.00 Dose: 1  . hydrochlorothiazide (HYDRODIURIL) 25 MG tablet Take 1 tablet (25 mg total) by mouth daily.  . Insulin Detemir (LEVEMIR FLEXTOUCH) 100 UNIT/ML Pen Inject 40 Units into the skin daily at 10 pm.  . Insulin Pen Needle (BD PEN NEEDLE NANO U/F) 32G X 4 MM MISC 1 each by Does not apply route daily.  Marland Kitchen levothyroxine (SYNTHROID, LEVOTHROID) 25 MCG tablet TAKE 1 TABLET   DAILY BEFORE BREAKFAST  . metFORMIN (GLUCOPHAGE-XR) 500 MG 24 hr tablet TAKE 2 TABLETS TWICE DAILY  . pravastatin (PRAVACHOL) 40 MG tablet TAKE 1 TABLET AT BEDTIME  . meloxicam (MOBIC) 15 MG tablet Take 1 tablet (15 mg total) by mouth daily. (Patient not taking: Reported on 06/20/2017)  . [DISCONTINUED] glimepiride (AMARYL) 4 MG tablet TAKE 1 TABLET TWICE DAILY (Patient not taking: Reported on 06/20/2017)   No facility-administered encounter medications on file as of 06/20/2017.     Activities of Daily Living In your present state of health, do you have any difficulty performing the following activities: 06/20/2017 03/16/2017  Hearing? Y N  Vision? Y N  Comment having cataracts surgery in dec -  Difficulty concentrating or making decisions? N N  Walking or climbing stairs? N N  Dressing or bathing? N N  Doing errands, shopping? N N  Preparing Food and eating ? N -  Using the Toilet? N -  In the past six months, have you accidently leaked urine? N -  Do you have problems with loss of bowel control? N -  Managing your Medications? N -  Managing your Finances? N -  Housekeeping or managing your Housekeeping? N -  Some recent data might be hidden    Patient  Care Team: Glean Hess, MD as PCP - General (Family Medicine)    Assessment:     Exercise Activities and Dietary recommendations Current Exercise Habits: The patient does not participate in regular exercise at present, Exercise limited by: None identified  Goals    None     Fall Risk Fall Risk  06/20/2017 01/18/2017 02/16/2016 08/14/2015  Falls in the past year? No No Yes No  Number falls in past yr: - - 1 -  Injury with Fall? - - Yes -   Depression Screen PHQ 2/9 Scores 06/20/2017 01/18/2017 08/14/2015  PHQ - 2 Score 0 0 0     Cognitive Function     6CIT Screen 06/20/2017  What Year? 0 points  What month? 0 points  What time? 0 points  Count back from 20 0 points  Months in reverse 0 points  Repeat phrase 2 points   Total Score 2    Immunization History  Administered Date(s) Administered  . Influenza, High Dose Seasonal PF 06/20/2017  . Influenza,inj,Quad PF,6+ Mos 08/14/2015, 09/27/2016  . Pneumococcal Conjugate-13 02/16/2016  . Pneumococcal Polysaccharide-23 11/15/2008, 10/14/2014   Screening Tests Health Maintenance  Topic Date Due  . MAMMOGRAM  08/11/2017 (Originally 11/07/2015)  . Hepatitis C Screening  06/11/2018 (Originally May 02, 1946)  . OPHTHALMOLOGY EXAM  09/08/2017  . HEMOGLOBIN A1C  11/20/2017  . FOOT EXAM  01/18/2018  . COLONOSCOPY  09/10/2022  . TETANUS/TDAP  06/12/2023  . INFLUENZA VACCINE  Completed  . DEXA SCAN  Completed  . PNA vac Low Risk Adult  Completed      Plan:    I have personally reviewed and addressed the Medicare Annual Wellness questionnaire and have noted the following in the patient's chart:  A. Medical and social history B. Use of alcohol, tobacco or illicit drugs  C. Current medications and supplements D. Functional ability and status E.  Nutritional status F.  Physical activity G. Advance directives H. List of other physicians I.  Hospitalizations, surgeries, and ER visits in previous 12 months J.  [place] such as hearing and vision if needed, cognitive and depression L. Referrals and appointments - none  In addition, I have reviewed and discussed with patient certain preventive protocols, quality metrics, and best practice recommendations. A written personalized care plan for preventive services as well as general preventive health recommendations were provided to patient.   Signed,  Tyler Aas, LPN Nurse Health Advisor   MD Recommendations:none

## 2017-06-20 NOTE — Patient Instructions (Addendum)
Angela Rose , Thank you for taking time to come for your Medicare Wellness Visit. I appreciate your ongoing commitment to your health goals. Please review the following plan we discussed and let me know if I can assist you in the future.   Screening recommendations/referrals: Colonoscopy: completed 09/10/2012 Mammogram: due now-Please call [phone] to schedule your mammogram.  Bone Density: completed  Recommended yearly ophthalmology/optometry visit for glaucoma screening and checkup Recommended yearly dental visit for hygiene and checkup  Vaccinations: Influenza vaccine: done today  Pneumococcal vaccine: up to date Tdap vaccine: please bring a copy of your immunization records Shingles vaccine:  due, check with your insurance company for coverage  Advanced directives: Please bring a copy of your health care power of attorney and living will to the office at your convenience.  Conditions/risks identified: none   Next appointment: Follow up on 09/19/2017 at 9:59m with Dr.Berglund. Follow up in one year for your annual wellness exam.    Preventive Care 65 Years and Older, Female Preventive care refers to lifestyle choices and visits with your health care provider that can promote health and wellness. What does preventive care include?  A yearly physical exam. This is also called an annual well check.  Dental exams once or twice a year.  Routine eye exams. Ask your health care provider how often you should have your eyes checked.  Personal lifestyle choices, including:  Daily care of your teeth and gums.  Regular physical activity.  Eating a healthy diet.  Avoiding tobacco and drug use.  Limiting alcohol use.  Practicing safe sex.  Taking low-dose aspirin every day.  Taking vitamin and mineral supplements as recommended by your health care provider. What happens during an annual well check? The services and screenings done by your health care provider during your  annual well check will depend on your age, overall health, lifestyle risk factors, and family history of disease. Counseling  Your health care provider may ask you questions about your:  Alcohol use.  Tobacco use.  Drug use.  Emotional well-being.  Home and relationship well-being.  Sexual activity.  Eating habits.  History of falls.  Memory and ability to understand (cognition).  Work and work Statistician.  Reproductive health. Screening  You may have the following tests or measurements:  Height, weight, and BMI.  Blood pressure.  Lipid and cholesterol levels. These may be checked every 5 years, or more frequently if you are over 53 years old.  Skin check.  Lung cancer screening. You may have this screening every year starting at age 15 if you have a 30-pack-year history of smoking and currently smoke or have quit within the past 15 years.  Fecal occult blood test (FOBT) of the stool. You may have this test every year starting at age 75.  Flexible sigmoidoscopy or colonoscopy. You may have a sigmoidoscopy every 5 years or a colonoscopy every 10 years starting at age 109.  Hepatitis C blood test.  Hepatitis B blood test.  Sexually transmitted disease (STD) testing.  Diabetes screening. This is done by checking your blood sugar (glucose) after you have not eaten for a while (fasting). You may have this done every 1-3 years.  Bone density scan. This is done to screen for osteoporosis. You may have this done starting at age 76.  Mammogram. This may be done every 1-2 years. Talk to your health care provider about how often you should have regular mammograms. Talk with your health care provider about your test results,  treatment options, and if necessary, the need for more tests. Vaccines  Your health care provider may recommend certain vaccines, such as:  Influenza vaccine. This is recommended every year.  Tetanus, diphtheria, and acellular pertussis (Tdap, Td)  vaccine. You may need a Td booster every 10 years.  Zoster vaccine. You may need this after age 15.  Pneumococcal 13-valent conjugate (PCV13) vaccine. One dose is recommended after age 70.  Pneumococcal polysaccharide (PPSV23) vaccine. One dose is recommended after age 64. Talk to your health care provider about which screenings and vaccines you need and how often you need them. This information is not intended to replace advice given to you by your health care provider. Make sure you discuss any questions you have with your health care provider. Document Released: 10/24/2015 Document Revised: 06/16/2016 Document Reviewed: 07/29/2015 Elsevier Interactive Patient Education  2017 [place] Prevention in the Home Falls can cause injuries. They can happen to people of all ages. There are many things you can do to make your home safe and to help prevent falls. What can I do on the outside of my home?  Regularly fix the edges of walkways and driveways and fix any cracks.  Remove anything that might make you trip as you walk through a door, such as a raised step or threshold.  Trim any bushes or trees on the path to your home.  Use bright outdoor lighting.  Clear any walking paths of anything that might make someone trip, such as rocks or tools.  Regularly check to see if handrails are loose or broken. Make sure that both sides of any steps have handrails.  Any raised decks and porches should have guardrails on the edges.  Have any leaves, snow, or ice cleared regularly.  Use sand or salt on walking paths during winter.  Clean up any spills in your garage right away. This includes oil or grease spills. What can I do in the bathroom?  Use night lights.  Install grab bars by the toilet and in the tub and shower. Do not use towel bars as grab bars.  Use non-skid mats or decals in the tub or shower.  If you need to sit down in the shower, use a plastic, non-slip  stool.  Keep the floor dry. Clean up any water that spills on the floor as soon as it happens.  Remove soap buildup in the tub or shower regularly.  Attach bath mats securely with double-sided non-slip rug tape.  Do not have throw rugs and other things on the floor that can make you trip. What can I do in the bedroom?  Use night lights.  Make sure that you have a light by your bed that is easy to reach.  Do not use any sheets or blankets that are too big for your bed. They should not hang down onto the floor.  Have a firm chair that has side arms. You can use this for support while you get dressed.  Do not have throw rugs and other things on the floor that can make you trip. What can I do in the kitchen?  Clean up any spills right away.  Avoid walking on wet floors.  Keep items that you use a lot in easy-to-reach places.  If you need to reach something above you, use a strong step stool that has a grab bar.  Keep electrical cords out of the way.  Do not use floor polish or wax that makes floors  slippery. If you must use wax, use non-skid floor wax.  Do not have throw rugs and other things on the floor that can make you trip. What can I do with my stairs?  Do not leave any items on the stairs.  Make sure that there are handrails on both sides of the stairs and use them. Fix handrails that are broken or loose. Make sure that handrails are as long as the stairways.  Check any carpeting to make sure that it is firmly attached to the stairs. Fix any carpet that is loose or worn.  Avoid having throw rugs at the top or bottom of the stairs. If you do have throw rugs, attach them to the floor with carpet tape.  Make sure that you have a light switch at the top of the stairs and the bottom of the stairs. If you do not have them, ask someone to add them for you. What else can I do to help prevent falls?  Wear shoes that:  Do not have high heels.  Have rubber bottoms.  Are  comfortable and fit you well.  Are closed at the toe. Do not wear sandals.  If you use a stepladder:  Make sure that it is fully opened. Do not climb a closed stepladder.  Make sure that both sides of the stepladder are locked into place.  Ask someone to hold it for you, if possible.  Clearly mark and make sure that you can see:  Any grab bars or handrails.  First and last steps.  Where the edge of each step is.  Use tools that help you move around (mobility aids) if they are needed. These include:  Canes.  Walkers.  Scooters.  Crutches.  Turn on the lights when you go into a dark area. Replace any light bulbs as soon as they burn out.  Set up your furniture so you have a clear path. Avoid moving your furniture around.  If any of your floors are uneven, fix them.  If there are any pets around you, be aware of where they are.  Review your medicines with your doctor. Some medicines can make you feel dizzy. This can increase your chance of falling. Ask your doctor what other things that you can do to help prevent falls. This information is not intended to replace advice given to you by your health care provider. Make sure you discuss any questions you have with your health care provider. Document Released: 07/24/2009 Document Revised: 03/04/2016 Document Reviewed: 11/01/2014 Elsevier Interactive Patient Education  2017 [place].  Influenza (Flu) Vaccine (Inactivated or Recombinant): What You Need to Know 1. Why get vaccinated? Influenza ("flu") is a contagious disease that spreads around the [place] every year, usually between October and May. Flu is caused by influenza viruses, and is spread mainly by coughing, sneezing, and close contact. Anyone can get flu. Flu strikes suddenly and can last several days. Symptoms vary by age, but can include:  fever/chills  sore throat  muscle aches  fatigue  cough  headache  runny or stuffy nose  Flu can  also lead to pneumonia and blood infections, and cause diarrhea and seizures in children. If you have a medical condition, such as heart or lung disease, flu can make it worse. Flu is more dangerous for some people. Infants and young children, people 60 years of age and older, pregnant women, and people with certain health conditions or a weakened immune system are at greatest risk. Each  year thousands of people in the [place] die from flu, and many more are hospitalized. Flu vaccine can:  keep you from getting flu,  make flu less severe if you do get it, and  keep you from spreading flu to your family and other people. 2. Inactivated and recombinant flu vaccines A dose of flu vaccine is recommended every flu season. Children 6 months through 32 years of age may need two doses during the same flu season. Everyone else needs only one dose each flu season. Some inactivated flu vaccines contain a very small amount of a mercury-based preservative called thimerosal. Studies have not shown thimerosal in vaccines to be harmful, but flu vaccines that do not contain thimerosal are available. There is no live flu virus in flu shots. They cannot cause the flu. There are many flu viruses, and they are always changing. Each year a new flu vaccine is made to protect against three or four viruses that are likely to cause disease in the upcoming flu season. But even when the vaccine doesn't exactly match these viruses, it may still provide some protection. Flu vaccine cannot prevent:  flu that is caused by a virus not covered by the vaccine, or  illnesses that look like flu but are not.  It takes about 2 weeks for protection to develop after vaccination, and protection lasts through the flu season. 3. Some people should not get this vaccine Tell the person who is giving you the vaccine:  If you have any severe, life-threatening allergies. If you ever had a life-threatening allergic reaction after a  dose of flu vaccine, or have a severe allergy to any part of this vaccine, you may be advised not to get vaccinated. Most, but not all, types of flu vaccine contain a small amount of egg protein.  If you ever had Guillain-Barr Syndrome (also called GBS). Some people with a history of GBS should not get this vaccine. This should be discussed with your doctor.  If you are not feeling well. It is usually okay to get flu vaccine when you have a mild illness, but you might be asked to come back when you feel better.  4. Risks of a vaccine reaction With any medicine, including vaccines, there is a chance of reactions. These are usually mild and go away on their own, but serious reactions are also possible. Most people who get a flu shot do not have any problems with it. Minor problems following a flu shot include:  soreness, redness, or swelling where the shot was given  hoarseness  sore, red or itchy eyes  cough  fever  aches  headache  itching  fatigue  If these problems occur, they usually begin soon after the shot and last 1 or 2 days. More serious problems following a flu shot can include the following:  There may be a small increased risk of Guillain-Barre Syndrome (GBS) after inactivated flu vaccine. This risk has been estimated at 1 or 2 additional cases per million people vaccinated. This is much lower than the risk of severe complications from flu, which can be prevented by flu vaccine.  Young children who get the flu shot along with pneumococcal vaccine (PCV13) and/or DTaP vaccine at the same time might be slightly more likely to have a seizure caused by fever. Ask your doctor for more information. Tell your doctor if a child who is getting flu vaccine has ever had a seizure.  Problems that could happen after any injected vaccine:  People sometimes faint after a medical procedure, including vaccination. Sitting or lying down for about 15 minutes can help prevent fainting,  and injuries caused by a fall. Tell your doctor if you feel dizzy, or have vision changes or ringing in the ears.  Some people get severe pain in the shoulder and have difficulty moving the arm where a shot was given. This happens very rarely.  Any medication can cause a severe allergic reaction. Such reactions from a vaccine are very rare, estimated at about 1 in a million doses, and would happen within a few minutes to a few hours after the vaccination. As with any medicine, there is a very remote chance of a vaccine causing a serious injury or death. The safety of vaccines is always being monitored. For more information, visit: http://www.aguilar.org/ 5. What if there is a serious reaction? What should I look for? Look for anything that concerns you, such as signs of a severe allergic reaction, very high fever, or unusual behavior. Signs of a severe allergic reaction can include hives, swelling of the face and throat, difficulty breathing, a fast heartbeat, dizziness, and weakness. These would start a few minutes to a few hours after the vaccination. What should I do?  If you think it is a severe allergic reaction or other emergency that can't wait, call 9-1-1 and get the person to the nearest hospital. Otherwise, call your doctor.  Reactions should be reported to the Vaccine Adverse Event Reporting System (VAERS). Your doctor should file this report, or you can do it yourself through the VAERS web site at www.vaers.SamedayNews.es, or by calling 787-852-9567. ? VAERS does not give medical advice. 6. The National Vaccine Injury Compensation Program The [employer] Vaccine Injury Compensation Program (VICP) is a federal program that was created to compensate people who may have been injured by certain vaccines. Persons who believe they may have been injured by a vaccine can learn about the program and about filing a claim by calling (772)495-4125 or visiting the [place] website at  GoldCloset.com.ee. There is a time limit to file a claim for compensation. 7. How can I learn more?  Ask your healthcare provider. He or she can give you the vaccine package insert or suggest other sources of information.  Call your local or state health department.  Contact the Centers for Disease Control and Prevention (CDC): ? Call (906) 286-1479 (1-800-CDC-INFO) or ? Visit CDC's website at https://gibson.com/ Vaccine Information Statement, Inactivated Influenza Vaccine (05/17/2014) This information is not intended to replace advice given to you by your health care provider. Make sure you discuss any questions you have with your health care provider. Document Released: 07/22/2006 Document Revised: 06/17/2016 Document Reviewed: 06/17/2016 Elsevier Interactive Patient Education  2017 [employer].

## 2017-06-27 ENCOUNTER — Other Ambulatory Visit: Payer: Self-pay | Admitting: Internal Medicine

## 2017-07-05 DIAGNOSIS — L570 Actinic keratosis: Secondary | ICD-10-CM | POA: Diagnosis not present

## 2017-07-05 DIAGNOSIS — L578 Other skin changes due to chronic exposure to nonionizing radiation: Secondary | ICD-10-CM | POA: Diagnosis not present

## 2017-07-05 DIAGNOSIS — L821 Other seborrheic keratosis: Secondary | ICD-10-CM | POA: Diagnosis not present

## 2017-07-05 DIAGNOSIS — Z85820 Personal history of malignant melanoma of skin: Secondary | ICD-10-CM | POA: Diagnosis not present

## 2017-07-05 DIAGNOSIS — Z86018 Personal history of other benign neoplasm: Secondary | ICD-10-CM | POA: Diagnosis not present

## 2017-07-05 DIAGNOSIS — Z872 Personal history of diseases of the skin and subcutaneous tissue: Secondary | ICD-10-CM | POA: Diagnosis not present

## 2017-07-05 DIAGNOSIS — Z85828 Personal history of other malignant neoplasm of skin: Secondary | ICD-10-CM | POA: Diagnosis not present

## 2017-07-25 ENCOUNTER — Other Ambulatory Visit: Payer: Self-pay | Admitting: Internal Medicine

## 2017-07-25 DIAGNOSIS — I10 Essential (primary) hypertension: Secondary | ICD-10-CM

## 2017-08-22 ENCOUNTER — Other Ambulatory Visit: Payer: Self-pay | Admitting: Internal Medicine

## 2017-09-19 ENCOUNTER — Ambulatory Visit: Payer: Medicare HMO | Admitting: Internal Medicine

## 2017-09-23 ENCOUNTER — Ambulatory Visit
Admission: RE | Admit: 2017-09-23 | Discharge: 2017-09-23 | Disposition: A | Discharge status: Home / Self Care | Payer: Medicare HMO | Source: Ambulatory Visit | Attending: Internal Medicine | Admitting: Internal Medicine

## 2017-09-23 DIAGNOSIS — Z1231 Encounter for screening mammogram for malignant neoplasm of breast: Secondary | ICD-10-CM | POA: Diagnosis not present

## 2017-09-26 ENCOUNTER — Other Ambulatory Visit: Payer: Self-pay | Admitting: Internal Medicine

## 2017-09-26 DIAGNOSIS — I10 Essential (primary) hypertension: Secondary | ICD-10-CM

## 2017-10-05 ENCOUNTER — Encounter: Payer: Self-pay | Admitting: Internal Medicine

## 2017-10-05 ENCOUNTER — Other Ambulatory Visit: Payer: Self-pay | Admitting: Internal Medicine

## 2017-10-05 ENCOUNTER — Ambulatory Visit (INDEPENDENT_AMBULATORY_CARE_PROVIDER_SITE_OTHER): Payer: Medicare HMO | Admitting: Internal Medicine

## 2017-10-05 VITALS — BP 130/68 | HR 79 | Ht 68.0 in | Wt 183.0 lb

## 2017-10-05 DIAGNOSIS — E113299 Type 2 diabetes mellitus with mild nonproliferative diabetic retinopathy without macular edema, unspecified eye: Secondary | ICD-10-CM | POA: Diagnosis not present

## 2017-10-05 DIAGNOSIS — I10 Essential (primary) hypertension: Secondary | ICD-10-CM

## 2017-10-05 NOTE — Progress Notes (Signed)
Date:  10/05/2017   Name:  Angela Rose   DOB:  Feb 09, 1946   MRN:  [id number]   Chief Complaint: Diabetes and Hypertension Diabetes  She presents for her follow-up diabetic visit. She has type 2 diabetes mellitus. Her disease course has been stable. Pertinent negatives for hypoglycemia include no headaches or tremors. Pertinent negatives for diabetes include no chest pain, no fatigue, no polydipsia and no polyuria. Symptoms are stable. Current diabetic treatment includes insulin injections and oral agent (monotherapy). She is compliant with treatment most of the time. Her weight is stable. She monitors blood glucose at home 1-2 x per day. There is no change in her home blood glucose trend. Her breakfast blood glucose is taken between 7-8 am. Her breakfast blood glucose range is generally 110-130 mg/dl.  Hypertension  This is a chronic problem. The problem is controlled. Pertinent negatives include no chest pain, headaches, palpitations or shortness of breath. Past treatments include ACE inhibitors and diuretics. There are no compliance problems.    Lab Results  Component Value Date   HGBA1C 8.0 (H) 05/20/2017   Lab Results  Component Value Date   CREATININE 1.26 (H) 05/20/2017   BUN 23 05/20/2017   NA 142 05/20/2017   K 4.5 05/20/2017   CL 99 05/20/2017   CO2 25 05/20/2017      Review of Systems  Constitutional: Negative for appetite change, fatigue, fever and unexpected weight change.  HENT: Negative for tinnitus and trouble swallowing.   Eyes: Negative for visual disturbance.  Respiratory: Negative for cough, chest tightness and shortness of breath.   Cardiovascular: Negative for chest pain, palpitations and leg swelling.  Gastrointestinal: Negative for abdominal pain.  Endocrine: Negative for polydipsia and polyuria.  Genitourinary: Negative for dysuria and hematuria.  Musculoskeletal: Positive for arthralgias and back pain. Negative for gait problem and joint  swelling.  Neurological: Negative for tremors, numbness and headaches.  Hematological: Negative for adenopathy.  Psychiatric/Behavioral: Negative for dysphoric mood.    Patient Active Problem List   Diagnosis Date Noted  . Other tear of medial meniscus, current injury, left knee, initial encounter 12/31/2015  . Fracture of tibial plateau 12/31/2015  . DM type 2 with diabetic background retinopathy ([place]) 08/14/2015  . Hyperlipidemia associated with type 2 diabetes mellitus ([place]) 08/14/2015  . Coronary artery disease involving native coronary artery of native heart without angina pectoris 08/14/2015  . Basal cell carcinoma 08/11/2015  . History of melanoma 08/11/2015  . Major depression in partial remission ([place]) 08/08/2015  . Non-proliferative diabetic retinopathy, both eyes ([place]) 08/08/2015  . History of colon polyps 08/08/2015  . Hypothyroidism, postablative 08/08/2015  . Carotid artery plaque 09/03/2014  . Essential (primary) hypertension 09/03/2014  . APC (atrial premature contractions) 09/03/2014    Prior to Admission medications   Medication Sig Start Date End Date Taking? Authorizing Provider  amLODipine (NORVASC) 2.5 MG tablet TAKE 1 TABLET EVERY DAY 06/28/17   Glean Hess, MD  aspirin 81 MG tablet Take by mouth.    [provider]  enalapril (VASOTEC) 5 MG tablet TAKE 1 TABLET TWICE DAILY 06/28/17   Glean Hess, MD  FLUoxetine (PROZAC) 40 MG capsule TAKE 1 CAPSULE EVERY DAY 08/23/17   Glean Hess, MD  glucose blood (ONE TOUCH ULTRA TEST) test strip Frequency:PHARMDIR   Dosage:0.0     Instructions:  Note:testing 3-4xqd, DX Code 250.00 Dose: 1 12/24/08   [provider]  hydrochlorothiazide (HYDRODIURIL) 25 MG tablet TAKE 1 TABLET EVERY  DAY 09/27/17   Glean Hess, MD  Insulin Detemir (LEVEMIR FLEXTOUCH) 100 UNIT/ML Pen Inject 40 Units into the skin daily at 10 pm. 05/20/17   Glean Hess, MD  Insulin Pen Needle (BD PEN NEEDLE NANO U/F)  32G X 4 MM MISC 1 each by Does not apply route daily. 01/18/17   Glean Hess, MD  levothyroxine (SYNTHROID, LEVOTHROID) 25 MCG tablet TAKE 1 TABLET  DAILY BEFORE BREAKFAST 06/28/17   Glean Hess, MD  meloxicam (MOBIC) 15 MG tablet Take 1 tablet (15 mg total) by mouth daily. Patient not taking: Reported on 06/20/2017 08/31/16   Frederich Cha, MD  metFORMIN (GLUCOPHAGE-XR) 500 MG 24 hr tablet TAKE 2 TABLETS TWICE DAILY 04/19/17   Glean Hess, MD  pravastatin (PRAVACHOL) 40 MG tablet TAKE 1 TABLET AT BEDTIME 08/23/17   Glean Hess, MD    Allergies  Allergen Reactions  . Other Other (See Comments)    sutures - in her hand-very irritated  . Tape Rash    BANDAIDS. BANDAIDS  . Xylocaine  [Lidocaine Hcl]     Other reaction(s): Other (See Comments) palpitatins and chest pain  . Propoxyphene     Past Surgical History:  Procedure Laterality Date  . APPENDECTOMY    . BASAL CELL CARCINOMA EXCISION    . CORONARY ARTERY BYPASS GRAFT  1996   x 3  . MELANOMA EXCISION    . PARTIAL HYSTERECTOMY  1976   ovaries remain    Social History   Tobacco Use  . Smoking status: Former Smoker    Last attempt to quit: 06/11/1982    Years since quitting: 35.3  . Smokeless tobacco: Never Used  Substance Use Topics  . Alcohol use: No    Alcohol/week: 0.0 oz  . Drug use: No     Medication list has been reviewed and updated.  PHQ 2/9 Scores 10/05/2017 06/20/2017 01/18/2017 08/14/2015  PHQ - 2 Score 0 0 0 0  PHQ- 9 Score 0 - - -    Physical Exam  Constitutional: She is oriented to person, place, and time. She appears well-developed. No distress.  HENT:  Head: Normocephalic and atraumatic.  Neck: Normal range of motion.  Cardiovascular: Normal rate, regular rhythm and normal heart sounds.  Pulmonary/Chest: Effort normal and breath sounds normal. No respiratory distress. She has no wheezes.  Abdominal: Soft.  Musculoskeletal: She exhibits no edema.  Neurological: She is alert and  oriented to person, place, and time.  Skin: Skin is warm and dry. No rash noted.  Psychiatric: She has a normal mood and affect. Her behavior is normal. Thought content normal.  Nursing note and vitals reviewed.   BP 130/68   Pulse 79   Ht 5\' 8"  (1.727 m)   Wt 183 lb (83 kg)   SpO2 97%   BMI 27.83 kg/m   Assessment and Plan: 1. Essential (primary) hypertension controlled  2. DM type 2 with diabetic background retinopathy ([place]) Continue current therapy Still on [facility] study but taking placebo - Basic metabolic panel - Hemoglobin A1c - Microalbumin / creatinine urine ratio   No orders of the defined types were placed in this encounter.   Partially dictated using Editor, commissioning. Any errors are unintentional.  Halina Maidens, MD [facility]  10/05/2017

## 2017-10-06 LAB — BASIC METABOLIC PANEL
BUN/Creatinine Ratio: 21 (ref 12–28)
BUN: 23 mg/dL (ref 8–27)
CALCIUM: 8.9 mg/dL (ref 8.7–10.3)
CO2: 21 mmol/L (ref 20–29)
Chloride: 99 mmol/L (ref 96–106)
Creatinine, Ser: 1.07 mg/dL — ABNORMAL HIGH (ref 0.57–1.00)
GFR calc Af Amer: 60 mL/min/{1.73_m2} (ref >59)
GFR, EST NON AFRICAN AMERICAN: 52 mL/min/{1.73_m2} — AB (ref >59)
Glucose: 196 mg/dL — ABNORMAL HIGH (ref 65–99)
POTASSIUM: 4.6 mmol/L (ref 3.5–5.2)
Sodium: 137 mmol/L (ref 134–144)

## 2017-10-06 LAB — HEMOGLOBIN A1C
ESTIMATED AVERAGE GLUCOSE: 209 mg/dL
Hgb A1c MFr Bld: 8.9 % — ABNORMAL HIGH (ref 4.8–5.6)

## 2017-10-07 ENCOUNTER — Other Ambulatory Visit: Payer: Self-pay | Admitting: Internal Medicine

## 2017-10-07 DIAGNOSIS — E113299 Type 2 diabetes mellitus with mild nonproliferative diabetic retinopathy without macular edema, unspecified eye: Secondary | ICD-10-CM

## 2017-10-07 MED ORDER — EMPAGLIFLOZIN 25 MG PO TABS: 25.0000 mg | ORAL_TABLET | Freq: Every day | ORAL | 3 refills | Status: DC

## 2017-10-07 NOTE — Progress Notes (Signed)
Pt informed of much worse DM- can take anything you prescribe. Wants [facility] sent in to [facility] [place].

## 2017-10-10 ENCOUNTER — Encounter: Payer: Self-pay | Admitting: Internal Medicine

## 2017-10-10 LAB — MICROALBUMIN / CREATININE URINE RATIO
Creatinine, Urine: 78.7 mg/dL
Microalb/Creat Ratio: 63 mg/g creat — ABNORMAL HIGH (ref 0.0–30.0)
Microalbumin, Urine: 49.6 ug/mL

## 2017-10-12 ENCOUNTER — Telehealth: Payer: Self-pay

## 2017-10-12 NOTE — Telephone Encounter (Signed)
She will need to call her insurance and find out which of these is the best price Tradjenta Onglyza Nesina

## 2017-10-12 NOTE — Telephone Encounter (Signed)
Patient informed- will call insurance and find out which is best for her. Awaiting her call back.

## 2017-10-12 NOTE — Telephone Encounter (Signed)
Patient called stating Januvia is $530 to get a RX. Wants different medication sent in besides this med.  Please Advise.

## 2017-11-14 ENCOUNTER — Other Ambulatory Visit: Payer: Self-pay | Admitting: Internal Medicine

## 2018-02-04 LAB — BASIC METABOLIC PANEL
BUN: 23 — AB (ref 4–21)
Creatinine: 1 (ref 0.5–1.1)
POTASSIUM: 4.2 (ref 3.4–5.3)
SODIUM: 140 (ref 137–147)

## 2018-02-04 LAB — HEPATIC FUNCTION PANEL
ALT: 12 (ref 7–35)
AST: 20 (ref 13–35)
Alkaline Phosphatase: 68 (ref 25–125)
Bilirubin, Total: 0.2

## 2018-02-04 LAB — LIPID PANEL
Cholesterol: 231 — AB (ref 0–200)
HDL: 60 (ref 35–70)
LDL Cholesterol: 135
Triglycerides: 180 — AB (ref 40–160)

## 2018-02-04 LAB — CBC AND DIFFERENTIAL
HCT: 39 (ref 36–46)
Hemoglobin: 12 (ref 12.0–16.0)
Platelets: 267 (ref 150–399)
WBC: 6.7

## 2018-02-04 LAB — HEMOGLOBIN A1C: Hemoglobin A1C: 9.1

## 2018-02-08 ENCOUNTER — Encounter: Payer: Self-pay | Admitting: Internal Medicine

## 2018-02-20 ENCOUNTER — Other Ambulatory Visit: Payer: Self-pay | Admitting: Internal Medicine

## 2018-03-25 ENCOUNTER — Ambulatory Visit
Admission: EM | Admit: 2018-03-25 | Discharge: 2018-03-25 | Disposition: A | Discharge status: Home / Self Care | Payer: Medicare HMO | Attending: Family Medicine | Admitting: Family Medicine

## 2018-03-25 DIAGNOSIS — K0889 Other specified disorders of teeth and supporting structures: Secondary | ICD-10-CM

## 2018-03-25 MED ORDER — AMOXICILLIN-POT CLAVULANATE 875-125 MG PO TABS: 1.0000 | ORAL_TABLET | Freq: Two times a day (BID) | ORAL | 0 refills | Status: DC

## 2018-03-25 MED ORDER — HYDROCODONE-ACETAMINOPHEN 5-325 MG PO TABS: 1.0000 | ORAL_TABLET | Freq: Three times a day (TID) | ORAL | 0 refills | Status: DC | PRN

## 2018-03-25 NOTE — ED Triage Notes (Addendum)
As per patient molar pain Right side onset 3 days ago. Pain makes her up at midnight.

## 2018-03-25 NOTE — Discharge Instructions (Signed)
Antibiotic as prescribed.  Pain medication if needed.  See dentist.  Take care  Dr. Lacinda Axon

## 2018-03-25 NOTE — ED Provider Notes (Signed)
[facility]    CSN: [id number] Arrival date & time: 03/25/18  1531  History   Chief Complaint Chief Complaint  Patient presents with  . Dental Pain   HPI  72 year old female presents with dental pain.  Dental pain  Started 3 days ago.  Has worsened.  Pain is quite severe.  Location: right lower dentition (# 28).  No fever or chills.  She reports improvement with ibuprofen.  No reports of damage or injury to the tooth.  The tooth has had a filling/dental work.  No other associated symptoms.  No other complaints.  Past Medical History:  Diagnosis Date  . Diabetes mellitus without complication ([place])   . Hyperlipidemia   . Hypertension    Patient Active Problem List   Diagnosis Date Noted  . Other tear of medial meniscus, current injury, left knee, initial encounter 12/31/2015  . Fracture of tibial plateau 12/31/2015  . DM (diabetes mellitus), type 2 with renal complications ([place]) 26/33/3545  . Hyperlipidemia associated with type 2 diabetes mellitus ([place]) 08/14/2015  . Coronary artery disease involving native coronary artery of native heart without angina pectoris 08/14/2015  . Basal cell carcinoma 08/11/2015  . History of melanoma 08/11/2015  . Major depression in partial remission ([place]) 08/08/2015  . Non-proliferative diabetic retinopathy, both eyes ([place]) 08/08/2015  . History of colon polyps 08/08/2015  . Hypothyroidism, postablative 08/08/2015  . Carotid artery plaque 09/03/2014  . Essential (primary) hypertension 09/03/2014  . APC (atrial premature contractions) 09/03/2014    Past Surgical History:  Procedure Laterality Date  . APPENDECTOMY    . BASAL CELL CARCINOMA EXCISION    . CORONARY ARTERY BYPASS GRAFT  1996   x 3  . MELANOMA EXCISION    . PARTIAL HYSTERECTOMY  1976   ovaries remain    OB History   None      Home Medications    Prior to Admission medications   Medication Sig Start Date End Date Taking? Authorizing  Provider  amLODipine (NORVASC) 2.5 MG tablet TAKE 1 TABLET EVERY DAY 11/15/17  Yes Glean Hess, MD  aspirin 81 MG tablet Take by mouth.   Yes [provider]  BD PEN NEEDLE NANO U/F 32G X 4 MM MISC USE EVERY DAY 02/20/18  Yes Glean Hess, MD  empagliflozin (JARDIANCE) 25 MG TABS tablet Take 25 mg by mouth daily. 10/07/17  Yes Glean Hess, MD  enalapril (VASOTEC) 5 MG tablet TAKE 1 TABLET TWICE DAILY 11/15/17  Yes Glean Hess, MD  FLUoxetine (PROZAC) 40 MG capsule TAKE 1 CAPSULE EVERY DAY 08/23/17  Yes Glean Hess, MD  glucose blood (ONE TOUCH ULTRA TEST) test strip Frequency:PHARMDIR   Dosage:0.0     Instructions:  Note:testing 3-4xqd, DX Code 250.00 Dose: 1 12/24/08  Yes [provider]  hydrochlorothiazide (HYDRODIURIL) 25 MG tablet TAKE 1 TABLET EVERY DAY 09/27/17  Yes Glean Hess, MD  Insulin Detemir (LEVEMIR FLEXTOUCH) 100 UNIT/ML Pen Inject 40 Units into the skin daily at 10 pm. 05/20/17  Yes Glean Hess, MD  levothyroxine (SYNTHROID, LEVOTHROID) 25 MCG tablet TAKE 1 TABLET  DAILY BEFORE BREAKFAST 11/15/17  Yes Glean Hess, MD  meloxicam (MOBIC) 15 MG tablet Take 1 tablet (15 mg total) by mouth daily. 08/31/16  Yes Frederich Cha, MD  metFORMIN (GLUCOPHAGE-XR) 500 MG 24 hr tablet TAKE 2 TABLETS TWICE DAILY 04/19/17  Yes Glean Hess, MD  pravastatin (PRAVACHOL) 40 MG tablet TAKE 1 TABLET AT BEDTIME 08/23/17  Yes Glean Hess, MD  amoxicillin-clavulanate (AUGMENTIN) 875-125 MG tablet Take 1 tablet by mouth every 12 (twelve) hours. 03/25/18   Coral Spikes, DO  HYDROcodone-acetaminophen (NORCO/VICODIN) 5-325 MG tablet Take 1 tablet by mouth every 8 (eight) hours as needed for moderate pain. 03/25/18   Coral Spikes, DO   Social History Social History   Tobacco Use  . Smoking status: Former Smoker    Last attempt to quit: 06/11/1982    Years since quitting: 35.8  . Smokeless tobacco: Never Used  Substance Use Topics  . Alcohol  use: No    Alcohol/week: 0.0 oz  . Drug use: No     Allergies   Propoxyphene; Lidocaine-epinephrine; Other; Tape; and Xylocaine  [lidocaine hcl]   Review of Systems Review of Systems  Constitutional: Negative.   HENT: Positive for dental problem.    Physical Exam Triage Vital Signs ED Triage Vitals  Enc Vitals Group     BP 03/25/18 1624 (!) 158/58     Pulse Rate 03/25/18 1624 (!) 54     Resp 03/25/18 1624 16     Temp 03/25/18 1624 98.2 F (36.8 C)     Temp Source 03/25/18 1624 Oral     SpO2 03/25/18 1624 99 %     Weight 03/25/18 1622 180 lb (81.6 kg)     Height 03/25/18 1622 5\' 6"  (1.676 m)     Head Circumference --      Peak Flow --      Pain Score 03/25/18 1621 6     Pain Loc --      Pain Edu? --      Excl. in [place]? --    Updated Vital Signs BP (!) 158/58 (BP Location: Left Arm)   Pulse (!) 54   Temp 98.2 F (36.8 C) (Oral)   Resp 16   Ht 5\' 6"  (1.676 m)   Wt 180 lb (81.6 kg)   SpO2 99%   BMI 29.05 kg/m   Physical Exam  Constitutional: She is oriented to person, place, and time. She appears well-developed. No distress.  HENT:  Mouth/Throat:    Patient with tenderness to palpation of labelled tooth. Mild left facial swelling.  Cardiovascular: Normal rate and regular rhythm.  Pulmonary/Chest: Effort normal and breath sounds normal. She has no wheezes. She has no rales.  Neurological: She is alert and oriented to person, place, and time.  Psychiatric: She has a normal mood and affect. Her behavior is normal.  Nursing note and vitals reviewed.   UC Treatments / Results  Labs (all labs ordered are listed, but only abnormal results are displayed) Labs Reviewed - No data to display  EKG None  Radiology No results found.  Procedures Procedures (including critical care time)  Medications Ordered in UC Medications - No data to display  Initial Impression / Assessment and Plan / UC Course  I have reviewed the triage vital signs and the nursing  notes.  Pertinent labs & imaging results that were available during my care of the patient were reviewed by me and considered in my medical decision making (see chart for details).    72 year old female presents with dental pain.  Covering for infection with Augmentin.  Pain medication as directed.  I reviewed the [place] controlled substance database.  She has not had a controlled substance at least since 2013.  The database will not allow me to go back further.  Final Clinical Impressions(s) / UC Diagnoses   Final  diagnoses:  Pain, dental     Discharge Instructions     Antibiotic as prescribed.  Pain medication if needed.  See dentist.  Take care  Dr. Lacinda Axon    ED Prescriptions    Medication Sig Dispense Auth. Provider   amoxicillin-clavulanate (AUGMENTIN) 875-125 MG tablet Take 1 tablet by mouth every 12 (twelve) hours. 14 tablet Kees Idrovo G, DO   HYDROcodone-acetaminophen (NORCO/VICODIN) 5-325 MG tablet Take 1 tablet by mouth every 8 (eight) hours as needed for moderate pain. 10 tablet Coral Spikes, DO     Controlled Substance Prescriptions [place] Controlled Substance Registry consulted? Yes, I have consulted the [place] Controlled Substances Registry for this patient, and feel the risk/benefit ratio today is favorable for proceeding with this prescription for a controlled substance.   Coral Spikes, [place] 03/25/18 1704

## 2018-04-05 ENCOUNTER — Encounter: Payer: Medicare HMO | Admitting: Internal Medicine

## 2018-04-12 ENCOUNTER — Other Ambulatory Visit: Payer: Self-pay | Admitting: Internal Medicine

## 2018-04-20 ENCOUNTER — Encounter: Payer: Medicare HMO | Admitting: Internal Medicine

## 2018-04-24 ENCOUNTER — Ambulatory Visit
Admission: RE | Admit: 2018-04-24 | Discharge: 2018-04-24 | Disposition: A | Discharge status: Home / Self Care | Payer: Medicare HMO | Source: Ambulatory Visit | Attending: Internal Medicine | Admitting: Internal Medicine

## 2018-04-24 ENCOUNTER — Encounter: Payer: Self-pay | Admitting: Internal Medicine

## 2018-04-24 ENCOUNTER — Ambulatory Visit (INDEPENDENT_AMBULATORY_CARE_PROVIDER_SITE_OTHER): Payer: Self-pay | Admitting: Internal Medicine

## 2018-04-24 VITALS — BP 132/74 | HR 74 | Ht 66.0 in | Wt 180.0 lb

## 2018-04-24 DIAGNOSIS — K5900 Constipation, unspecified: Secondary | ICD-10-CM

## 2018-04-24 DIAGNOSIS — R935 Abnormal findings on diagnostic imaging of other abdominal regions, including retroperitoneum: Secondary | ICD-10-CM | POA: Insufficient documentation

## 2018-04-24 DIAGNOSIS — E113299 Type 2 diabetes mellitus with mild nonproliferative diabetic retinopathy without macular edema, unspecified eye: Secondary | ICD-10-CM

## 2018-04-24 DIAGNOSIS — F3341 Major depressive disorder, recurrent, in partial remission: Secondary | ICD-10-CM

## 2018-04-24 DIAGNOSIS — Z794 Long term (current) use of insulin: Secondary | ICD-10-CM | POA: Diagnosis not present

## 2018-04-24 NOTE — Progress Notes (Signed)
Date:  04/24/2018   Name:  Angela Rose   DOB:  03-01-1946   MRN:  [id number]   Chief Complaint: Constipation (Started "months ago"- starts with constipation, and then diarrhea with a "blow out". No change in diet or medications. Pain in lower right abdomen. At night back and left side hurts and cannot lay on that side. )  Constipation  This is a recurrent problem. The problem has been waxing and waning since onset. The stool is described as pellet like and loose (varyingly). The patient is not on a high fiber diet. There has been adequate water intake. Associated symptoms include fecal incontinence and nausea. Pertinent negatives include no abdominal pain, fever or vomiting. She has tried nothing for the symptoms.  Diabetes  She presents for her follow-up diabetic visit. She has type 2 diabetes mellitus. Her disease course has been stable. Pertinent negatives for hypoglycemia include no dizziness or headaches. Pertinent negatives for diabetes include no chest pain and no fatigue. Current diabetic treatment includes insulin injections and oral agent (monotherapy). She is compliant with treatment most of the time. Her weight is stable. An ACE inhibitor/angiotensin II receptor blocker is being taken.  Depression         This is a chronic problem.  Associated symptoms include no fatigue and no headaches.     The symptoms are aggravated by social issues.  Past treatments include SSRIs - Selective serotonin reuptake inhibitors.    Review of Systems  Constitutional: Negative for chills, fatigue and fever.  Respiratory: Negative for chest tightness and shortness of breath.   Cardiovascular: Negative for chest pain and palpitations.  Gastrointestinal: Positive for constipation and nausea. Negative for abdominal pain and vomiting.  Neurological: Negative for dizziness and headaches.  Psychiatric/Behavioral: Positive for depression.    Patient Active Problem List   Diagnosis Date Noted  .  Other tear of medial meniscus, current injury, left knee, initial encounter 12/31/2015  . Fracture of tibial plateau 12/31/2015  . DM (diabetes mellitus), type 2 with renal complications ([place]) 94/04/6807  . Hyperlipidemia associated with type 2 diabetes mellitus ([place]) 08/14/2015  . Coronary artery disease involving native coronary artery of native heart without angina pectoris 08/14/2015  . Basal cell carcinoma 08/11/2015  . History of melanoma 08/11/2015  . Major depression in partial remission ([place]) 08/08/2015  . Non-proliferative diabetic retinopathy, both eyes ([place]) 08/08/2015  . History of colon polyps 08/08/2015  . Hypothyroidism, postablative 08/08/2015  . Carotid artery plaque 09/03/2014  . Essential (primary) hypertension 09/03/2014  . APC (atrial premature contractions) 09/03/2014    Prior to Admission medications   Medication Sig Start Date End Date Taking? Authorizing Provider  amLODipine (NORVASC) 2.5 MG tablet TAKE 1 TABLET EVERY DAY 11/15/17  Yes Glean Hess, MD  aspirin 81 MG tablet Take by mouth.   Yes [provider]  BD PEN NEEDLE NANO U/F 32G X 4 MM MISC USE EVERY DAY 02/20/18  Yes Glean Hess, MD  empagliflozin (JARDIANCE) 25 MG TABS tablet Take 25 mg by mouth daily. 10/07/17  Yes Glean Hess, MD  enalapril (VASOTEC) 5 MG tablet TAKE 1 TABLET TWICE DAILY 11/15/17  Yes Glean Hess, MD  FLUoxetine (PROZAC) 40 MG capsule TAKE 1 CAPSULE EVERY DAY 08/23/17  Yes Glean Hess, MD  glucose blood (ONE TOUCH ULTRA TEST) test strip Frequency:PHARMDIR   Dosage:0.0     Instructions:  Note:testing 3-4xqd, DX Code 250.00 Dose: 1 12/24/08  Yes [provider]  hydrochlorothiazide (HYDRODIURIL) 25 MG tablet TAKE 1 TABLET EVERY DAY 09/27/17  Yes Glean Hess, MD  HYDROcodone-acetaminophen (NORCO/VICODIN) 5-325 MG tablet Take 1 tablet by mouth every 8 (eight) hours as needed for moderate pain. 03/25/18  Yes Cook, Jayce G, DO  Insulin Detemir  (LEVEMIR FLEXTOUCH) 100 UNIT/ML Pen Inject 40 Units into the skin daily at 10 pm. 05/20/17  Yes Glean Hess, MD  levothyroxine (SYNTHROID, LEVOTHROID) 25 MCG tablet TAKE 1 TABLET  DAILY BEFORE BREAKFAST 11/15/17  Yes Glean Hess, MD  meloxicam (MOBIC) 15 MG tablet Take 1 tablet (15 mg total) by mouth daily. 08/31/16  Yes Frederich Cha, MD  metFORMIN (GLUCOPHAGE-XR) 500 MG 24 hr tablet TAKE 2 TABLETS TWICE DAILY 04/12/18  Yes Glean Hess, MD  pravastatin (PRAVACHOL) 40 MG tablet TAKE 1 TABLET AT BEDTIME 08/23/17  Yes Glean Hess, MD    Allergies  Allergen Reactions  . Propoxyphene Nausea And Vomiting    Other reaction(s): Vomiting  . Lidocaine-Epinephrine     Other reaction(s): Other (See Comments) palpitatins and chest pain  . Other Other (See Comments)    sutures - in her hand-very irritated  . Tape Rash    BANDAIDS. BANDAIDS  . Xylocaine  [Lidocaine Hcl]     Other reaction(s): Other (See Comments) palpitatins and chest pain    Past Surgical History:  Procedure Laterality Date  . APPENDECTOMY    . BASAL CELL CARCINOMA EXCISION    . CORONARY ARTERY BYPASS GRAFT  1996   x 3  . MELANOMA EXCISION    . PARTIAL HYSTERECTOMY  1976   ovaries remain    Social History   Tobacco Use  . Smoking status: Former Smoker    Last attempt to quit: 06/11/1982    Years since quitting: 35.8  . Smokeless tobacco: Never Used  Substance Use Topics  . Alcohol use: No    Alcohol/week: 0.0 oz  . Drug use: No     Medication list has been reviewed and updated.  Current Meds  Medication Sig  . amLODipine (NORVASC) 2.5 MG tablet TAKE 1 TABLET EVERY DAY  . aspirin 81 MG tablet Take by mouth.  . BD PEN NEEDLE NANO U/F 32G X 4 MM MISC USE EVERY DAY  . empagliflozin (JARDIANCE) 25 MG TABS tablet Take 25 mg by mouth daily.  . enalapril (VASOTEC) 5 MG tablet TAKE 1 TABLET TWICE DAILY  . FLUoxetine (PROZAC) 40 MG capsule TAKE 1 CAPSULE EVERY DAY  . glucose blood (ONE TOUCH ULTRA  TEST) test strip Frequency:PHARMDIR   Dosage:0.0     Instructions:  Note:testing 3-4xqd, DX Code 250.00 Dose: 1  . hydrochlorothiazide (HYDRODIURIL) 25 MG tablet TAKE 1 TABLET EVERY DAY  . HYDROcodone-acetaminophen (NORCO/VICODIN) 5-325 MG tablet Take 1 tablet by mouth every 8 (eight) hours as needed for moderate pain.  . Insulin Detemir (LEVEMIR FLEXTOUCH) 100 UNIT/ML Pen Inject 40 Units into the skin daily at 10 pm.  . levothyroxine (SYNTHROID, LEVOTHROID) 25 MCG tablet TAKE 1 TABLET  DAILY BEFORE BREAKFAST  . meloxicam (MOBIC) 15 MG tablet Take 1 tablet (15 mg total) by mouth daily.  . metFORMIN (GLUCOPHAGE-XR) 500 MG 24 hr tablet TAKE 2 TABLETS TWICE DAILY  . pravastatin (PRAVACHOL) 40 MG tablet TAKE 1 TABLET AT BEDTIME    PHQ 2/9 Scores 04/24/2018 10/05/2017 06/20/2017 01/18/2017  PHQ - 2 Score 0 0 0 0  PHQ- 9 Score 0 0 - -    Physical Exam  Constitutional: She is oriented  to person, place, and time. She appears well-developed. No distress.  HENT:  Head: Normocephalic and atraumatic.  Neck: Normal range of motion.  Cardiovascular: Normal rate, regular rhythm and normal heart sounds.  Pulmonary/Chest: Effort normal and breath sounds normal. No respiratory distress.  Abdominal: Soft. Bowel sounds are decreased. There is tenderness in the left lower quadrant. There is no rigidity, no rebound, no guarding and no CVA tenderness.  Musculoskeletal: Normal range of motion.  Neurological: She is alert and oriented to person, place, and time.  Skin: Skin is warm and dry. No rash noted.  Psychiatric: She has a normal mood and affect. Her behavior is normal. Thought content normal.  Nursing note and vitals reviewed.   BP 132/74   Pulse 74   Ht 5\' 6"  (1.676 m)   Wt 180 lb (81.6 kg)   SpO2 96%   BMI 29.05 kg/m   Assessment and Plan: 1. Constipation, unspecified constipation type Stool softener daily - DG Abd 1 View; Future - CBC with Differential/Platelet  2. DM type 2 with diabetic  background retinopathy ([place]) Continue current medications - Comprehensive metabolic panel - Hemoglobin A1c  3. Recurrent major depressive disorder, in partial remission ([place]) Doing well on Prozac   No orders of the defined types were placed in this encounter.   Partially dictated using Editor, commissioning. Any errors are unintentional.  Halina Maidens, MD [facility]  04/24/2018

## 2018-04-24 NOTE — Patient Instructions (Signed)
Begin Colace 100 mg once a day with 8 oz of water

## 2018-04-25 ENCOUNTER — Other Ambulatory Visit: Payer: Self-pay | Admitting: Internal Medicine

## 2018-04-25 DIAGNOSIS — R1902 Left upper quadrant abdominal swelling, mass and lump: Secondary | ICD-10-CM

## 2018-04-25 DIAGNOSIS — E113299 Type 2 diabetes mellitus with mild nonproliferative diabetic retinopathy without macular edema, unspecified eye: Secondary | ICD-10-CM

## 2018-04-25 DIAGNOSIS — R140 Abdominal distension (gaseous): Secondary | ICD-10-CM

## 2018-04-25 LAB — COMPREHENSIVE METABOLIC PANEL
ALT: 11 IU/L (ref 0–32)
AST: 18 IU/L (ref 0–40)
Albumin/Globulin Ratio: 1.9 (ref 1.2–2.2)
Albumin: 4.6 g/dL (ref 3.5–4.8)
Alkaline Phosphatase: 68 IU/L (ref 39–117)
BUN/Creatinine Ratio: 22 (ref 12–28)
BUN: 22 mg/dL (ref 8–27)
Bilirubin Total: 0.4 mg/dL (ref 0.0–1.2)
CALCIUM: 9 mg/dL (ref 8.7–10.3)
CO2: 20 mmol/L (ref 20–29)
Chloride: 98 mmol/L (ref 96–106)
Creatinine, Ser: 1.02 mg/dL — ABNORMAL HIGH (ref 0.57–1.00)
GFR, EST AFRICAN AMERICAN: 63 mL/min/{1.73_m2} (ref >59)
GFR, EST NON AFRICAN AMERICAN: 54 mL/min/{1.73_m2} — AB (ref >59)
Globulin, Total: 2.4 g/dL (ref 1.5–4.5)
Glucose: 161 mg/dL — ABNORMAL HIGH (ref 65–99)
POTASSIUM: 4.4 mmol/L (ref 3.5–5.2)
Sodium: 135 mmol/L (ref 134–144)
TOTAL PROTEIN: 7 g/dL (ref 6.0–8.5)

## 2018-04-25 LAB — CBC WITH DIFFERENTIAL/PLATELET
BASOS ABS: 0.1 10*3/uL (ref 0.0–0.2)
Basos: 1 %
EOS (ABSOLUTE): 0.2 10*3/uL (ref 0.0–0.4)
Eos: 2 %
Hematocrit: 36.7 % (ref 34.0–46.6)
Hemoglobin: 12.1 g/dL (ref 11.1–15.9)
Immature Grans (Abs): 0 10*3/uL (ref 0.0–0.1)
Immature Granulocytes: 0 %
LYMPHS ABS: 1.6 10*3/uL (ref 0.7–3.1)
Lymphs: 22 %
MCH: 29 pg (ref 26.6–33.0)
MCHC: 33 g/dL (ref 31.5–35.7)
MCV: 88 fL (ref 79–97)
Monocytes Absolute: 0.8 10*3/uL (ref 0.1–0.9)
Monocytes: 11 %
NEUTROS ABS: 4.4 10*3/uL (ref 1.4–7.0)
Neutrophils: 64 %
PLATELETS: 319 10*3/uL (ref 150–450)
RBC: 4.17 x10E6/uL (ref 3.77–5.28)
RDW: 13.2 % (ref 12.3–15.4)
WBC: 7 10*3/uL (ref 3.4–10.8)

## 2018-04-25 LAB — HEMOGLOBIN A1C
ESTIMATED AVERAGE GLUCOSE: 209 mg/dL
Hgb A1c MFr Bld: 8.9 % — ABNORMAL HIGH (ref 4.8–5.6)

## 2018-04-25 MED ORDER — INSULIN PEN NEEDLE 32G X 4 MM MISC: 1.0000 | Freq: Two times a day (BID) | 3 refills | Status: DC

## 2018-04-25 MED ORDER — INSULIN DETEMIR 100 UNIT/ML FLEXPEN: 26.0000 [IU] | PEN_INJECTOR | Freq: Two times a day (BID) | SUBCUTANEOUS | 3 refills | Status: DC

## 2018-04-28 ENCOUNTER — Other Ambulatory Visit: Payer: Self-pay

## 2018-04-28 DIAGNOSIS — R1902 Left upper quadrant abdominal swelling, mass and lump: Secondary | ICD-10-CM

## 2018-04-28 DIAGNOSIS — R1012 Left upper quadrant pain: Secondary | ICD-10-CM

## 2018-05-01 ENCOUNTER — Encounter: Payer: Self-pay | Admitting: Internal Medicine

## 2018-05-01 ENCOUNTER — Ambulatory Visit
Admission: RE | Admit: 2018-05-01 | Discharge: 2018-05-01 | Disposition: A | Discharge status: Home / Self Care | Payer: Medicare HMO | Source: Ambulatory Visit | Attending: Internal Medicine | Admitting: Internal Medicine

## 2018-05-01 DIAGNOSIS — R1012 Left upper quadrant pain: Secondary | ICD-10-CM

## 2018-05-01 DIAGNOSIS — K623 Rectal prolapse: Secondary | ICD-10-CM | POA: Diagnosis not present

## 2018-05-01 DIAGNOSIS — R1902 Left upper quadrant abdominal swelling, mass and lump: Secondary | ICD-10-CM | POA: Insufficient documentation

## 2018-05-01 DIAGNOSIS — I700 Atherosclerosis of aorta: Secondary | ICD-10-CM | POA: Diagnosis not present

## 2018-05-01 DIAGNOSIS — K753 Granulomatous hepatitis, not elsewhere classified: Secondary | ICD-10-CM

## 2018-05-01 DIAGNOSIS — R197 Diarrhea, unspecified: Secondary | ICD-10-CM | POA: Diagnosis not present

## 2018-05-01 DIAGNOSIS — N281 Cyst of kidney, acquired: Secondary | ICD-10-CM

## 2018-05-01 HISTORY — DX: Malignant (primary) neoplasm, unspecified: C80.1

## 2018-05-01 HISTORY — DX: Cyst of kidney, acquired: N28.1

## 2018-05-01 HISTORY — DX: Granulomatous hepatitis, not elsewhere classified: K75.3

## 2018-05-01 MED ORDER — IOPAMIDOL (ISOVUE-300) INJECTION 61%
100.0000 mL | Freq: Once | INTRAVENOUS | Status: AC | PRN
  Administered 2018-05-01: 100 mL via INTRAVENOUS

## 2018-05-12 ENCOUNTER — Encounter: Payer: Self-pay | Admitting: Internal Medicine

## 2018-05-29 ENCOUNTER — Other Ambulatory Visit: Payer: Self-pay | Admitting: Internal Medicine

## 2018-06-20 ENCOUNTER — Other Ambulatory Visit: Payer: Self-pay | Admitting: Internal Medicine

## 2018-06-22 ENCOUNTER — Other Ambulatory Visit: Payer: Self-pay | Admitting: Internal Medicine

## 2018-06-23 ENCOUNTER — Encounter: Payer: Self-pay | Admitting: Internal Medicine

## 2018-06-23 ENCOUNTER — Ambulatory Visit (INDEPENDENT_AMBULATORY_CARE_PROVIDER_SITE_OTHER): Payer: Medicare HMO | Admitting: Internal Medicine

## 2018-06-23 VITALS — BP 152/78 | HR 67 | Ht 66.0 in | Wt 180.0 lb

## 2018-06-23 DIAGNOSIS — Z794 Long term (current) use of insulin: Secondary | ICD-10-CM

## 2018-06-23 DIAGNOSIS — E1129 Type 2 diabetes mellitus with other diabetic kidney complication: Secondary | ICD-10-CM | POA: Diagnosis not present

## 2018-06-23 DIAGNOSIS — R809 Proteinuria, unspecified: Secondary | ICD-10-CM

## 2018-06-23 DIAGNOSIS — N3000 Acute cystitis without hematuria: Secondary | ICD-10-CM | POA: Diagnosis not present

## 2018-06-23 DIAGNOSIS — Z23 Encounter for immunization: Secondary | ICD-10-CM | POA: Diagnosis not present

## 2018-06-23 LAB — POCT URINALYSIS DIPSTICK
BILIRUBIN UA: NEGATIVE
GLUCOSE UA: NEGATIVE
Ketones, UA: NEGATIVE
Nitrite, UA: NEGATIVE
PH UA: 6.5 (ref 5.0–8.0)
Protein, UA: NEGATIVE
RBC UA: NEGATIVE
Spec Grav, UA: <=1.005 — AB (ref 1.010–1.025)
Urobilinogen, UA: 0.2 E.U./dL

## 2018-06-23 MED ORDER — FLUCONAZOLE 100 MG PO TABS: 100.0000 mg | ORAL_TABLET | Freq: Once | ORAL | 0 refills | Status: AC

## 2018-06-23 MED ORDER — CEPHALEXIN 500 MG PO CAPS: 500.0000 mg | ORAL_CAPSULE | Freq: Three times a day (TID) | ORAL | 0 refills | Status: AC

## 2018-06-23 NOTE — Progress Notes (Signed)
Date:  06/23/2018   Name:  Angela Rose   DOB:  1946-02-11   MRN:  [id number]   Chief Complaint: Urinary Tract Infection (Feeling of not emptying urine. Started at [facility] at diabetes clinic 2 weeks ago. Still having sx. ) Urinary Tract Infection   This is a new problem. The current episode started 1 to 4 weeks ago. The problem occurs every urination. The problem has been gradually worsening. The quality of the pain is described as burning. The pain is mild. There has been no fever. Associated symptoms include frequency and urgency. She has tried increased fluids for the symptoms.   DM - she is finishing a study at [facility] and starts a new one in October.  She can not afford Jardiance and it was contraindicated with her last study drug which she is sure was a placebo. The next study is an injection for DM with hx of CAD.  She says the study is sponsored by [employer].   Review of Systems  Constitutional: Negative for diaphoresis, fatigue and fever.  Respiratory: Negative for cough, chest tightness, shortness of breath and wheezing.   Cardiovascular: Negative for chest pain and palpitations.  Genitourinary: Positive for dysuria, frequency and urgency.  Skin: Negative for color change and rash.  Psychiatric/Behavioral: Negative for sleep disturbance.    Patient Active Problem List   Diagnosis Date Noted  . Renal cyst, right 05/01/2018  . 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  . Other tear of medial meniscus, current injury, left knee, initial encounter 12/31/2015  . Fracture of tibial plateau 12/31/2015  . DM (diabetes mellitus), type 2 with renal complications ([place]) 37/07/6268  . Hyperlipidemia associated with type 2 diabetes mellitus ([place]) 08/14/2015  . Coronary artery disease involving native coronary artery of native heart without angina pectoris 08/14/2015  . Basal cell carcinoma 08/11/2015  . History of melanoma 08/11/2015  . Major depression in partial remission ([place]) 08/08/2015    . Non-proliferative diabetic retinopathy, both eyes ([place]) 08/08/2015  . History of colon polyps 08/08/2015  . Hypothyroidism, postablative 08/08/2015  . Carotid artery plaque 09/03/2014  . Essential (primary) hypertension 09/03/2014  . APC (atrial premature contractions) 09/03/2014    Allergies  Allergen Reactions  . Propoxyphene Nausea And Vomiting    Other reaction(s): Vomiting  . Lidocaine-Epinephrine     Other reaction(s): Other (See Comments) palpitatins and chest pain  . Other Other (See Comments)    sutures - in her hand-very irritated  . Tape Rash    BANDAIDS. BANDAIDS  . Xylocaine  [Lidocaine Hcl]     Other reaction(s): Other (See Comments) palpitatins and chest pain    Past Surgical History:  Procedure Laterality Date  . APPENDECTOMY    . BASAL CELL CARCINOMA EXCISION    . CORONARY ARTERY BYPASS GRAFT  1996   x 3  . MELANOMA EXCISION    . PARTIAL HYSTERECTOMY  1976   ovaries remain    Social History   Tobacco Use  . Smoking status: Former Smoker    Last attempt to quit: 06/11/1982    Years since quitting: 36.0  . Smokeless tobacco: Never Used  Substance Use Topics  . Alcohol use: No    Alcohol/week: 0.0 standard drinks  . Drug use: No     Medication list has been reviewed and updated.  Current Meds  Medication Sig  . amLODipine (NORVASC) 2.5 MG tablet TAKE 1 TABLET EVERY DAY  . aspirin 81 MG tablet Take by mouth.  Marland Kitchen  enalapril (VASOTEC) 5 MG tablet TAKE 1 TABLET TWICE DAILY  . FLUoxetine (PROZAC) 40 MG capsule TAKE 1 CAPSULE EVERY DAY  . glucose blood (ONE TOUCH ULTRA TEST) test strip Frequency:PHARMDIR   Dosage:0.0     Instructions:  Note:testing 3-4xqd, DX Code 250.00 Dose: 1  . hydrochlorothiazide (HYDRODIURIL) 25 MG tablet TAKE 1 TABLET EVERY DAY  . HYDROcodone-acetaminophen (NORCO/VICODIN) 5-325 MG tablet Take 1 tablet by mouth every 8 (eight) hours as needed for moderate pain.  . Insulin Detemir (LEVEMIR FLEXTOUCH) 100 UNIT/ML Pen Inject 26  Units into the skin 2 (two) times daily.  . Insulin Pen Needle (BD PEN NEEDLE NANO U/F) 32G X 4 MM MISC 1 each by Other route 2 (two) times daily.  Marland Kitchen levothyroxine (SYNTHROID, LEVOTHROID) 25 MCG tablet TAKE 1 TABLET  DAILY BEFORE BREAKFAST  . meloxicam (MOBIC) 15 MG tablet Take 1 tablet (15 mg total) by mouth daily.  . metFORMIN (GLUCOPHAGE-XR) 500 MG 24 hr tablet TAKE 2 TABLETS TWICE DAILY  . pravastatin (PRAVACHOL) 40 MG tablet TAKE 1 TABLET AT BEDTIME    PHQ 2/9 Scores 04/24/2018 10/05/2017 06/20/2017 01/18/2017  PHQ - 2 Score 0 0 0 0  PHQ- 9 Score 0 0 - -    Physical Exam  Constitutional: She is oriented to person, place, and time. She appears well-developed and well-nourished.  Cardiovascular: Normal rate, regular rhythm and normal heart sounds.  Pulmonary/Chest: Effort normal and breath sounds normal. No respiratory distress.  Abdominal: Soft. Bowel sounds are normal. There is tenderness in the suprapubic area. There is no rebound, no guarding and no CVA tenderness.  Musculoskeletal: Normal range of motion.  Neurological: She is alert and oriented to person, place, and time.  Psychiatric: She has a normal mood and affect.  Nursing note and vitals reviewed.   BP (!) 152/78 (BP Location: Right Arm, Patient Position: Sitting, Cuff Size: Normal)   Pulse 67   Ht 5\' 6"  (1.676 m)   Wt 180 lb (81.6 kg)   SpO2 97%   BMI 29.05 kg/m   Assessment and Plan: 1. Acute cystitis without hematuria Increase fluids - POCT urinalysis dipstick - fluconazole (DIFLUCAN) 100 MG tablet; Take 1 tablet (100 mg total) by mouth once for 1 dose.  Dispense: 1 tablet; Refill: 0 - cephALEXin (KEFLEX) 500 MG capsule; Take 1 capsule (500 mg total) by mouth 3 (three) times daily for 7 days.  Dispense: 21 capsule; Refill: 0  2. Need for influenza vaccination - Flu vaccine HIGH DOSE PF  3. Type 2 diabetes mellitus with microalbuminuria, with long-term current use of insulin (HCC) Continue current  therapy Check on best SGLT-2 cost vs GLP-1   Meds ordered this encounter  Medications  . fluconazole (DIFLUCAN) 100 MG tablet    Sig: Take 1 tablet (100 mg total) by mouth once for 1 dose.    Dispense:  1 tablet    Refill:  0  . cephALEXin (KEFLEX) 500 MG capsule    Sig: Take 1 capsule (500 mg total) by mouth 3 (three) times daily for 7 days.    Dispense:  21 capsule    Refill:  0    Partially dictated using Editor, commissioning. Any errors are unintentional.  Halina Maidens, MD [facility]  06/23/2018   There are no diagnoses linked to this encounter.

## 2018-06-23 NOTE — Patient Instructions (Addendum)
Jardiance 25 mg  Invokana 300 mg  Farxiga 10 mg  Steglatro 15 mg  These are the injectables: Ozempic  Trulicity  Bydureon B-cise

## 2018-06-27 ENCOUNTER — Other Ambulatory Visit: Payer: Self-pay | Admitting: Internal Medicine

## 2018-07-11 ENCOUNTER — Other Ambulatory Visit: Payer: Self-pay | Admitting: Internal Medicine

## 2018-07-11 DIAGNOSIS — I10 Essential (primary) hypertension: Secondary | ICD-10-CM

## 2018-07-12 ENCOUNTER — Ambulatory Visit: Payer: Self-pay

## 2018-07-12 ENCOUNTER — Other Ambulatory Visit: Payer: Self-pay

## 2018-07-12 MED ORDER — METFORMIN HCL ER 500 MG PO TB24: 1000.0000 mg | ORAL_TABLET | Freq: Two times a day (BID) | ORAL | 1 refills | Status: DC

## 2018-08-08 DIAGNOSIS — H25813 Combined forms of age-related cataract, bilateral: Secondary | ICD-10-CM | POA: Diagnosis not present

## 2018-08-08 DIAGNOSIS — E113213 Type 2 diabetes mellitus with mild nonproliferative diabetic retinopathy with macular edema, bilateral: Secondary | ICD-10-CM | POA: Diagnosis not present

## 2018-08-08 DIAGNOSIS — H43813 Vitreous degeneration, bilateral: Secondary | ICD-10-CM | POA: Diagnosis not present

## 2018-08-08 DIAGNOSIS — Z794 Long term (current) use of insulin: Secondary | ICD-10-CM | POA: Diagnosis not present

## 2018-08-08 LAB — HM DIABETES EYE EXAM

## 2018-08-09 LAB — HEMOGLOBIN A1C: HEMOGLOBIN A1C: 7.6 — AB (ref 4.0–6.0)

## 2018-08-11 ENCOUNTER — Encounter: Payer: Self-pay | Admitting: Internal Medicine

## 2018-08-11 ENCOUNTER — Ambulatory Visit (INDEPENDENT_AMBULATORY_CARE_PROVIDER_SITE_OTHER): Payer: 59 | Admitting: Internal Medicine

## 2018-08-11 VITALS — BP 122/74 | HR 55 | Ht 66.0 in | Wt 182.2 lb

## 2018-08-11 DIAGNOSIS — R809 Proteinuria, unspecified: Secondary | ICD-10-CM | POA: Diagnosis not present

## 2018-08-11 DIAGNOSIS — Z794 Long term (current) use of insulin: Secondary | ICD-10-CM | POA: Diagnosis not present

## 2018-08-11 DIAGNOSIS — H9313 Tinnitus, bilateral: Secondary | ICD-10-CM

## 2018-08-11 DIAGNOSIS — I10 Essential (primary) hypertension: Secondary | ICD-10-CM

## 2018-08-11 DIAGNOSIS — E785 Hyperlipidemia, unspecified: Secondary | ICD-10-CM | POA: Diagnosis not present

## 2018-08-11 DIAGNOSIS — E1129 Type 2 diabetes mellitus with other diabetic kidney complication: Secondary | ICD-10-CM | POA: Diagnosis not present

## 2018-08-11 DIAGNOSIS — E1169 Type 2 diabetes mellitus with other specified complication: Secondary | ICD-10-CM | POA: Diagnosis not present

## 2018-08-11 DIAGNOSIS — N3281 Overactive bladder: Secondary | ICD-10-CM

## 2018-08-11 DIAGNOSIS — Z1159 Encounter for screening for other viral diseases: Secondary | ICD-10-CM

## 2018-08-11 HISTORY — DX: Overactive bladder: N32.81

## 2018-08-11 HISTORY — DX: Tinnitus, bilateral: H93.13

## 2018-08-11 NOTE — Progress Notes (Signed)
Date:  08/11/2018   Name:  Angela Rose   DOB:  04/01/1946   MRN:  [id number]   Chief Complaint: Diabetes (Follow up) and Hypertension  Diabetes  She presents for her follow-up diabetic visit. She has type 2 diabetes mellitus. Her disease course has been stable. Pertinent negatives for hypoglycemia include no headaches or tremors. Associated symptoms include polyuria. Pertinent negatives for diabetes include no chest pain, no fatigue and no polydipsia. Current diabetic treatment includes insulin injections (metformin and supposed to be on insulin). Her weight is stable. She monitors blood glucose at home 1-2 x per day. Her breakfast blood glucose is taken between 6-7 am. An ACE inhibitor/angiotensin II receptor blocker is being taken.  Hypertension  This is a chronic problem. The problem is controlled. Pertinent negatives include no chest pain, headaches, palpitations or shortness of breath. Past treatments include calcium channel blockers, ACE inhibitors and diuretics.  Hyperlipidemia  This is a chronic problem. Condition status: not at goal. Pertinent negatives include no chest pain or shortness of breath. Current antihyperlipidemic treatment includes statins.  She continues in the study at [facility] - now oral ozempic vs placebo.  Her A1C earlier this week was 7.6. She is having urinary frequency and urgency without dysuria, cloudy urine or hematuria.  She has a UTI last month that has resolved.  She does not have incontinence or need to wear depends at this time. Tinnitus - she has long standing ringing in both ears that is slowly worsening.  She does not think that her hearing is significantly decreased.  She has never had an ENT evaluation.  Review of Systems  Constitutional: Negative for appetite change, fatigue, fever and unexpected weight change.  HENT: Positive for congestion, sinus pressure and tinnitus. Negative for trouble swallowing.   Eyes: Negative for visual disturbance.    Respiratory: Negative for cough, chest tightness and shortness of breath.   Cardiovascular: Negative for chest pain, palpitations and leg swelling.  Gastrointestinal: Positive for constipation and diarrhea (alternating). Negative for abdominal pain.  Endocrine: Positive for polyuria. Negative for polydipsia.  Genitourinary: Positive for frequency and urgency. Negative for dysuria and hematuria.  Musculoskeletal: Negative for arthralgias.  Skin: Negative for color change and rash.  Neurological: Negative for tremors, numbness and headaches.  Psychiatric/Behavioral: Negative for dysphoric mood.    Patient Active Problem List   Diagnosis Date Noted  . Renal cyst, right 05/01/2018  . 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  . Other tear of medial meniscus, current injury, left knee, initial encounter 12/31/2015  . Fracture of tibial plateau 12/31/2015  . DM (diabetes mellitus), type 2 with renal complications ([place]) 55/97/4163  . Hyperlipidemia associated with type 2 diabetes mellitus ([place]) 08/14/2015  . Coronary artery disease involving native coronary artery of native heart without angina pectoris 08/14/2015  . Basal cell carcinoma 08/11/2015  . History of melanoma 08/11/2015  . Major depression in partial remission ([place]) 08/08/2015  . Non-proliferative diabetic retinopathy, both eyes ([place]) 08/08/2015  . History of colon polyps 08/08/2015  . Hypothyroidism, postablative 08/08/2015  . Carotid artery plaque 09/03/2014  . Essential (primary) hypertension 09/03/2014  . APC (atrial premature contractions) 09/03/2014    Allergies  Allergen Reactions  . Propoxyphene Nausea And Vomiting    Other reaction(s): Vomiting  . Lidocaine-Epinephrine     Other reaction(s): Other (See Comments) palpitatins and chest pain  . Other Other (See Comments)    sutures - in her hand-very irritated  . Tape Rash    BANDAIDS.  BANDAIDS  . Xylocaine  [Lidocaine Hcl]     Other reaction(s): Other (See  Comments) palpitatins and chest pain    Past Surgical History:  Procedure Laterality Date  . APPENDECTOMY    . BASAL CELL CARCINOMA EXCISION    . CORONARY ARTERY BYPASS GRAFT  1996   x 3  . MELANOMA EXCISION    . PARTIAL HYSTERECTOMY  1976   ovaries remain    Social History   Tobacco Use  . Smoking status: Former Smoker    Last attempt to quit: 06/11/1982    Years since quitting: 36.1  . Smokeless tobacco: Never Used  . Tobacco comment: smoking cessation materials not required  Substance Use Topics  . Alcohol use: No    Alcohol/week: 0.0 standard drinks  . Drug use: No     Medication list has been reviewed and updated.  Current Meds  Medication Sig  . amLODipine (NORVASC) 2.5 MG tablet TAKE 1 TABLET EVERY DAY  . aspirin 81 MG tablet Take by mouth.  . enalapril (VASOTEC) 5 MG tablet TAKE 1 TABLET TWICE DAILY  . FLUoxetine (PROZAC) 40 MG capsule TAKE ONE CAPSULE BY MOUTH ONCE DAILY  . glucose blood (ONE TOUCH ULTRA TEST) test strip Frequency:PHARMDIR   Dosage:0.0     Instructions:  Note:testing 3-4xqd, DX Code 250.00 Dose: 1  . hydrochlorothiazide (HYDRODIURIL) 25 MG tablet TAKE 1 TABLET EVERY DAY  . HYDROcodone-acetaminophen (NORCO/VICODIN) 5-325 MG tablet Take 1 tablet by mouth every 8 (eight) hours as needed for moderate pain.  . Insulin Detemir (LEVEMIR FLEXTOUCH) 100 UNIT/ML Pen Inject 26 Units into the skin 2 (two) times daily.  . Insulin Pen Needle (BD PEN NEEDLE NANO U/F) 32G X 4 MM MISC 1 each by Other route 2 (two) times daily.  . Investigational - Study Medication Study name: Semaglutide 3 mg or placebo tablets - 7 tablets  . levothyroxine (SYNTHROID, LEVOTHROID) 25 MCG tablet TAKE 1 TABLET  DAILY BEFORE BREAKFAST  . meloxicam (MOBIC) 15 MG tablet Take 1 tablet (15 mg total) by mouth daily.  . metFORMIN (GLUCOPHAGE-XR) 500 MG 24 hr tablet Take 2 tablets (1,000 mg total) by mouth 2 (two) times daily.  . pravastatin (PRAVACHOL) 40 MG tablet TAKE 1 TABLET AT BEDTIME     PHQ 2/9 Scores 08/11/2018 04/24/2018 10/05/2017 06/20/2017  PHQ - 2 Score 0 0 0 0  PHQ- 9 Score 0 0 0 -    Physical Exam  Constitutional: She is oriented to person, place, and time. She appears well-developed. No distress.  HENT:  Head: Normocephalic and atraumatic.  Right Ear: Tympanic membrane normal.  Left Ear: Tympanic membrane normal.  Nose: Right sinus exhibits no maxillary sinus tenderness. Left sinus exhibits no maxillary sinus tenderness.  Mouth/Throat: Oropharynx is clear and moist.  Eyes: Pupils are equal, round, and reactive to light.  Neck: Normal range of motion. Neck supple.  Cardiovascular: Normal rate, regular rhythm and normal heart sounds.  Pulmonary/Chest: Effort normal and breath sounds normal. No respiratory distress.  Abdominal: Soft. Bowel sounds are normal. She exhibits no mass. There is no tenderness. There is no guarding.  Musculoskeletal: Normal range of motion.  Neurological: She is alert and oriented to person, place, and time.  Skin: Skin is warm and dry. No rash noted.  Psychiatric: She has a normal mood and affect. Her behavior is normal. Thought content normal.  Nursing note and vitals reviewed.   BP 122/74 (BP Location: Right Arm, Patient Position: Sitting, Cuff Size: Normal)  Pulse (!) 55   Ht 5\' 6"  (1.676 m)   Wt 182 lb 3.2 oz (82.6 kg)   SpO2 99%   BMI 29.41 kg/m   Assessment and Plan: 1. Type 2 diabetes mellitus with microalbuminuria, with long-term current use of insulin (HCC) Continue metformin and levemir - hopefully also getting study drug - Hemoglobin Q8H - Basic metabolic panel  2. Essential (primary) hypertension controlled  3. Need for hepatitis C screening test - Hepatitis C antibody  4. Hyperlipidemia associated with type 2 diabetes mellitus ([place])  5. OAB (overactive bladder) Consider medication trial if worsening  6. Tinnitus aurium, bilateral - Ambulatory referral to ENT   Partially dictated using Dragon  software. Any errors are unintentional.  Halina Maidens, MD [facility]  08/11/2018

## 2018-08-12 LAB — BASIC METABOLIC PANEL
BUN / CREAT RATIO: 19 (ref 12–28)
BUN: 21 mg/dL (ref 8–27)
CO2: 21 mmol/L (ref 20–29)
CREATININE: 1.09 mg/dL — AB (ref 0.57–1.00)
Calcium: 9.6 mg/dL (ref 8.7–10.3)
Chloride: 100 mmol/L (ref 96–106)
GFR, EST AFRICAN AMERICAN: 58 mL/min/{1.73_m2} — AB (ref >59)
GFR, EST NON AFRICAN AMERICAN: 50 mL/min/{1.73_m2} — AB (ref >59)
GLUCOSE: 144 mg/dL — AB (ref 65–99)
Potassium: 4.3 mmol/L (ref 3.5–5.2)
Sodium: 140 mmol/L (ref 134–144)

## 2018-08-12 LAB — HEMOGLOBIN A1C
Est. average glucose Bld gHb Est-mCnc: 209 mg/dL
Hgb A1c MFr Bld: 8.9 % — ABNORMAL HIGH (ref 4.8–5.6)

## 2018-08-12 LAB — HEPATITIS C ANTIBODY: Hep C Virus Ab: <0.1 s/co ratio (ref 0.0–0.9)

## 2018-08-24 ENCOUNTER — Other Ambulatory Visit: Payer: Self-pay | Admitting: Otolaryngology

## 2018-08-24 DIAGNOSIS — R42 Dizziness and giddiness: Secondary | ICD-10-CM | POA: Diagnosis not present

## 2018-08-24 DIAGNOSIS — H90A22 Sensorineural hearing loss, unilateral, left ear, with restricted hearing on the contralateral side: Secondary | ICD-10-CM | POA: Diagnosis not present

## 2018-08-24 DIAGNOSIS — H9319 Tinnitus, unspecified ear: Secondary | ICD-10-CM | POA: Diagnosis not present

## 2018-08-24 DIAGNOSIS — J310 Chronic rhinitis: Secondary | ICD-10-CM | POA: Diagnosis not present

## 2018-08-28 DIAGNOSIS — H8190 Unspecified disorder of vestibular function, unspecified ear: Secondary | ICD-10-CM | POA: Diagnosis not present

## 2018-08-28 DIAGNOSIS — R42 Dizziness and giddiness: Secondary | ICD-10-CM | POA: Diagnosis not present

## 2018-09-05 DIAGNOSIS — H269 Unspecified cataract: Secondary | ICD-10-CM

## 2018-09-05 HISTORY — DX: Unspecified cataract: H26.9

## 2018-09-19 ENCOUNTER — Ambulatory Visit
Admission: RE | Admit: 2018-09-19 | Discharge: 2018-09-19 | Disposition: A | Discharge status: Home / Self Care | Payer: Medicare HMO | Source: Ambulatory Visit | Attending: Otolaryngology | Admitting: Otolaryngology

## 2018-09-19 DIAGNOSIS — H90A22 Sensorineural hearing loss, unilateral, left ear, with restricted hearing on the contralateral side: Secondary | ICD-10-CM | POA: Diagnosis not present

## 2018-09-19 DIAGNOSIS — H9042 Sensorineural hearing loss, unilateral, left ear, with unrestricted hearing on the contralateral side: Secondary | ICD-10-CM | POA: Diagnosis not present

## 2018-09-19 MED ORDER — GADOBUTROL 1 MMOL/ML IV SOLN
8.5000 mL | Freq: Once | INTRAVENOUS | Status: AC | PRN
  Administered 2018-09-19: 8.5 mL via INTRAVENOUS

## 2018-09-20 ENCOUNTER — Ambulatory Visit (INDEPENDENT_AMBULATORY_CARE_PROVIDER_SITE_OTHER): Payer: Medicare HMO

## 2018-09-20 ENCOUNTER — Other Ambulatory Visit: Payer: Self-pay | Admitting: Internal Medicine

## 2018-09-20 VITALS — BP 128/68 | HR 64 | Temp 97.6°F | Resp 16 | Ht 66.0 in | Wt 179.8 lb

## 2018-09-20 DIAGNOSIS — Z780 Asymptomatic menopausal state: Secondary | ICD-10-CM | POA: Diagnosis not present

## 2018-09-20 DIAGNOSIS — Z1231 Encounter for screening mammogram for malignant neoplasm of breast: Secondary | ICD-10-CM

## 2018-09-20 DIAGNOSIS — Z0000 Encounter for general adult medical examination without abnormal findings: Secondary | ICD-10-CM | POA: Diagnosis not present

## 2018-09-20 NOTE — Patient Instructions (Signed)
[name] , Thank you for taking time to come for your Medicare Wellness Visit. I appreciate your ongoing commitment to your health goals. Please review the following plan we discussed and let me know if I can assist you in the future.   Screening recommendations/referrals: Colonoscopy: done 09/10/12 repeat in 2023 Mammogram: done 09/23/17. Please call [phone] to schedule your mammogram and bone density exam. Recommended yearly ophthalmology/optometry visit for glaucoma screening and checkup Recommended yearly dental visit for hygiene and checkup  Vaccinations: Influenza vaccine: done 06/23/18 Pneumococcal vaccine: done 02/16/16 Tdap vaccine: done 2014 Shingles vaccine: Shingrix discussed. Please contact your pharmacy for coverage information.     Advanced directives: Advance directive discussed with you today. I have provided a copy for you to complete at home and have notarized. Once this is complete please bring a copy in to our office so we can scan it into your chart.  Conditions/risks identified: Recommend increase physical activity to 150 minutes per week.   Next appointment: Please follow up in one year for your Medicare Annual Wellness visit.     Preventive Care 50 Years and Older, Female Preventive care refers to lifestyle choices and visits with your health care provider that can promote health and wellness. What does preventive care include?  A yearly physical exam. This is also called an annual well check.  Dental exams once or twice a year.  Routine eye exams. Ask your health care provider how often you should have your eyes checked.  Personal lifestyle choices, including:  Daily care of your teeth and gums.  Regular physical activity.  Eating a healthy diet.  Avoiding tobacco and drug use.  Limiting alcohol use.  Practicing safe sex.  Taking low-dose aspirin every day.  Taking vitamin and mineral supplements as recommended by your health care  provider. What happens during an annual well check? The services and screenings done by your health care provider during your annual well check will depend on your age, overall health, lifestyle risk factors, and family history of disease. Counseling  Your health care provider may ask you questions about your:  Alcohol use.  Tobacco use.  Drug use.  Emotional well-being.  Home and relationship well-being.  Sexual activity.  Eating habits.  History of falls.  Memory and ability to understand (cognition).  Work and work Statistician.  Reproductive health. Screening  You may have the following tests or measurements:  Height, weight, and BMI.  Blood pressure.  Lipid and cholesterol levels. These may be checked every 5 years, or more frequently if you are over 42 years old.  Skin check.  Lung cancer screening. You may have this screening every year starting at age 6 if you have a 30-pack-year history of smoking and currently smoke or have quit within the past 15 years.  Fecal occult blood test (FOBT) of the stool. You may have this test every year starting at age 14.  Flexible sigmoidoscopy or colonoscopy. You may have a sigmoidoscopy every 5 years or a colonoscopy every 10 years starting at age 65.  Hepatitis C blood test.  Hepatitis B blood test.  Sexually transmitted disease (STD) testing.  Diabetes screening. This is done by checking your blood sugar (glucose) after you have not eaten for a while (fasting). You may have this done every 1-3 years.  Bone density scan. This is done to screen for osteoporosis. You may have this done starting at age 73.  Mammogram. This may be done every 1-2 years. Talk to your  health care provider about how often you should have regular mammograms. Talk with your health care provider about your test results, treatment options, and if necessary, the need for more tests. Vaccines  Your health care provider may recommend certain  vaccines, such as:  Influenza vaccine. This is recommended every year.  Tetanus, diphtheria, and acellular pertussis (Tdap, Td) vaccine. You may need a Td booster every 10 years.  Zoster vaccine. You may need this after age 64.  Pneumococcal 13-valent conjugate (PCV13) vaccine. One dose is recommended after age 9.  Pneumococcal polysaccharide (PPSV23) vaccine. One dose is recommended after age 60. Talk to your health care provider about which screenings and vaccines you need and how often you need them. This information is not intended to replace advice given to you by your health care provider. Make sure you discuss any questions you have with your health care provider. Document Released: 10/24/2015 Document Revised: 06/16/2016 Document Reviewed: 07/29/2015 Elsevier Interactive Patient Education  2017 [place] Prevention in the Home Falls can cause injuries. They can happen to people of all ages. There are many things you can do to make your home safe and to help prevent falls. What can I do on the outside of my home?  Regularly fix the edges of walkways and driveways and fix any cracks.  Remove anything that might make you trip as you walk through a door, such as a raised step or threshold.  Trim any bushes or trees on the path to your home.  Use bright outdoor lighting.  Clear any walking paths of anything that might make someone trip, such as rocks or tools.  Regularly check to see if handrails are loose or broken. Make sure that both sides of any steps have handrails.  Any raised decks and porches should have guardrails on the edges.  Have any leaves, snow, or ice cleared regularly.  Use sand or salt on walking paths during winter.  Clean up any spills in your garage right away. This includes oil or grease spills. What can I do in the bathroom?  Use night lights.  Install grab bars by the toilet and in the tub and shower. Do not use towel bars as grab  bars.  Use non-skid mats or decals in the tub or shower.  If you need to sit down in the shower, use a plastic, non-slip stool.  Keep the floor dry. Clean up any water that spills on the floor as soon as it happens.  Remove soap buildup in the tub or shower regularly.  Attach bath mats securely with double-sided non-slip rug tape.  Do not have throw rugs and other things on the floor that can make you trip. What can I do in the bedroom?  Use night lights.  Make sure that you have a light by your bed that is easy to reach.  Do not use any sheets or blankets that are too big for your bed. They should not hang down onto the floor.  Have a firm chair that has side arms. You can use this for support while you get dressed.  Do not have throw rugs and other things on the floor that can make you trip. What can I do in the kitchen?  Clean up any spills right away.  Avoid walking on wet floors.  Keep items that you use a lot in easy-to-reach places.  If you need to reach something above you, use a strong step stool that has a  grab bar.  Keep electrical cords out of the way.  Do not use floor polish or wax that makes floors slippery. If you must use wax, use non-skid floor wax.  Do not have throw rugs and other things on the floor that can make you trip. What can I do with my stairs?  Do not leave any items on the stairs.  Make sure that there are handrails on both sides of the stairs and use them. Fix handrails that are broken or loose. Make sure that handrails are as long as the stairways.  Check any carpeting to make sure that it is firmly attached to the stairs. Fix any carpet that is loose or worn.  Avoid having throw rugs at the top or bottom of the stairs. If you do have throw rugs, attach them to the floor with carpet tape.  Make sure that you have a light switch at the top of the stairs and the bottom of the stairs. If you do not have them, ask someone to add them for  you. What else can I do to help prevent falls?  Wear shoes that:  Do not have high heels.  Have rubber bottoms.  Are comfortable and fit you well.  Are closed at the toe. Do not wear sandals.  If you use a stepladder:  Make sure that it is fully opened. Do not climb a closed stepladder.  Make sure that both sides of the stepladder are locked into place.  Ask someone to hold it for you, if possible.  Clearly mark and make sure that you can see:  Any grab bars or handrails.  First and last steps.  Where the edge of each step is.  Use tools that help you move around (mobility aids) if they are needed. These include:  Canes.  Walkers.  Scooters.  Crutches.  Turn on the lights when you go into a dark area. Replace any light bulbs as soon as they burn out.  Set up your furniture so you have a clear path. Avoid moving your furniture around.  If any of your floors are uneven, fix them.  If there are any pets around you, be aware of where they are.  Review your medicines with your doctor. Some medicines can make you feel dizzy. This can increase your chance of falling. Ask your doctor what other things that you can do to help prevent falls. This information is not intended to replace advice given to you by your health care provider. Make sure you discuss any questions you have with your health care provider. Document Released: 07/24/2009 Document Revised: 03/04/2016 Document Reviewed: 11/01/2014 Elsevier Interactive Patient Education  2017 [employer].

## 2018-09-20 NOTE — Progress Notes (Signed)
Subjective:   Angela Rose is a 72 y.o. female who presents for Medicare Annual (Subsequent) preventive examination.  Review of Systems:   Cardiac Risk Factors include: diabetes mellitus;dyslipidemia;hypertension advanced age female > age 45     Objective:     Vitals: BP 128/68 (BP Location: Left Arm, Patient Position: Sitting, Cuff Size: Normal)   Pulse 64   Temp 97.6 F (36.4 C) (Oral)   Resp 16   Ht 5\' 6"  (1.676 m)   Wt 179 lb 12.8 oz (81.6 kg)   BMI 29.02 kg/m   Body mass index is 29.02 kg/m.  Advanced Directives 09/20/2018 06/20/2017  Does Patient Have a Medical Advance Directive? No Yes  Type of Advance Directive - [place];Living will  Copy of [place] in Chart? - No - copy requested  Would patient like information on creating a medical advance directive? Yes (MAU/Ambulatory/Procedural Areas - Information given) -    Tobacco Social History   Tobacco Use  Smoking Status Former Smoker  . Last attempt to quit: 06/11/1982  . Years since quitting: 36.3  Smokeless Tobacco Never Used  Tobacco Comment   smoking cessation materials not required     Counseling given: Not Answered Comment: smoking cessation materials not required   Clinical Intake:  Pre-visit preparation completed: Yes  Pain : No/denies pain     Nutritional Status: BMI 25 -29 Overweight Nutritional Risks: None Diabetes: Yes CBG done?: No Did pt. bring in CBG monitor from home?: Yes Glucose Meter Downloaded?: No   Nutrition Risk Assessment:  Has the patient had any N/V/D within the last 2 months?  No  Does the patient have any non-healing wounds?  No  Has the patient had any unintentional weight loss or weight gain?  No   Diabetes:  Is the patient diabetic?  Yes  If diabetic, was a CBG obtained today?  No  Did the patient bring in their glucometer from home?  Yes  How often do you monitor your CBG's? daily.   Financial Strains and  Diabetes Management:  Are you having any financial strains with the device, your supplies or your medication? No .  Does the patient want to be seen by Chronic Care Management for management of their diabetes?  No  Would the patient like to be referred to a Nutritionist or for Diabetic Management?  No   Diabetic Exams:  Diabetic Eye Exam: Completed 08/08/18 positive retinopathy.   Diabetic Foot Exam: Completed 06/23/18.   How often do you need to have someone help you when you read instructions, pamphlets, or other written materials from your doctor or pharmacy?: 1 - Never What is the last grade level you completed in school?: some college  Interpreter Needed?: No  Information entered by :: Clemetine Marker LPN  Past Medical History:  Diagnosis Date  . Anxiety   . Cancer ([place])    melanoma face  . Depression   . Diabetes mellitus without complication ([place])   . Hyperlipidemia   . Hypertension    Past Surgical History:  Procedure Laterality Date  . APPENDECTOMY    . BASAL CELL CARCINOMA EXCISION    . CORONARY ARTERY BYPASS GRAFT  1996   x 3  . MELANOMA EXCISION    . PARTIAL HYSTERECTOMY  1976   ovaries remain   Family History  Problem Relation Age of Onset  . Skin cancer Mother   . Squamous cell carcinoma Mother   . Basal cell carcinoma  Mother   . Heart disease Father   . CAD Father    Social History   Socioeconomic History  . Marital status: Significant Other    Spouse name: Not on file  . Number of children: 3  . Years of education: Not on file  . Highest education level: Some college, no degree  Occupational History  . Occupation: retired  Scientific laboratory technician  . Financial resource strain: Hard  . Food insecurity:    Worry: Never true    Inability: Never true  . Transportation needs:    Medical: No    Non-medical: No  Tobacco Use  . Smoking status: Former Smoker    Last attempt to quit: 06/11/1982    Years since quitting: 36.3  . Smokeless tobacco: Never Used  .  Tobacco comment: smoking cessation materials not required  Substance and Sexual Activity  . Alcohol use: Yes    Alcohol/week: 0.0 standard drinks    Comment: rare 1 wine glass of wine  . Drug use: No  . Sexual activity: Not on file  Lifestyle  . Physical activity:    Days per week: 0 days    Minutes per session: 0 min  . Stress: Rather much  Relationships  . Social connections:    Talks on phone: More than three times a week    Gets together: More than three times a week    Attends religious service: Never    Active member of club or organization: No    Attends meetings of clubs or organizations: Never    Relationship status: Living with partner  Other Topics Concern  . Not on file  Social History Narrative  . Not on file    Outpatient Encounter Medications as of 09/20/2018  Medication Sig  . amLODipine (NORVASC) 2.5 MG tablet TAKE 1 TABLET EVERY DAY  . aspirin 81 MG tablet Take by mouth.  . enalapril (VASOTEC) 5 MG tablet TAKE 1 TABLET TWICE DAILY  . FLUoxetine (PROZAC) 40 MG capsule TAKE ONE CAPSULE BY MOUTH ONCE DAILY  . glucose blood (ONE TOUCH ULTRA TEST) test strip Frequency:PHARMDIR   Dosage:0.0     Instructions:  Note:testing 3-4xqd, DX Code 250.00 Dose: 1  . hydrochlorothiazide (HYDRODIURIL) 25 MG tablet TAKE 1 TABLET EVERY DAY  . Insulin Detemir (LEVEMIR FLEXTOUCH) 100 UNIT/ML Pen Inject 26 Units into the skin 2 (two) times daily.  . Insulin Pen Needle (BD PEN NEEDLE NANO U/F) 32G X 4 MM MISC 1 each by Other route 2 (two) times daily.  . Investigational - Study Medication Study name: Semaglutide 3 mg or placebo tablets - 7 tablets  . levothyroxine (SYNTHROID, LEVOTHROID) 25 MCG tablet TAKE 1 TABLET  DAILY BEFORE BREAKFAST  . metFORMIN (GLUCOPHAGE-XR) 500 MG 24 hr tablet Take 2 tablets (1,000 mg total) by mouth 2 (two) times daily.  . pravastatin (PRAVACHOL) 40 MG tablet TAKE 1 TABLET AT BEDTIME  . HYDROcodone-acetaminophen (NORCO/VICODIN) 5-325 MG tablet Take 1  tablet by mouth every 8 (eight) hours as needed for moderate pain. (Patient not taking: Reported on 09/20/2018)  . meloxicam (MOBIC) 15 MG tablet Take 1 tablet (15 mg total) by mouth daily. (Patient not taking: Reported on 09/20/2018)   No facility-administered encounter medications on file as of 09/20/2018.     Activities of Daily Living In your present state of health, do you have any difficulty performing the following activities: 09/20/2018 06/23/2018  Hearing? Y N  Comment hard of hearing. being seen by ENT -  Vision?  N N  Comment wears glasses -  Difficulty concentrating or making decisions? N N  Walking or climbing stairs? N N  Dressing or bathing? N N  Doing errands, shopping? N N  Preparing Food and eating ? N -  Using the Toilet? N -  In the past six months, have you accidently leaked urine? N -  Do you have problems with loss of bowel control? N -  Managing your Medications? N -  Managing your Finances? N -  Housekeeping or managing your Housekeeping? N -  Some recent data might be hidden    Patient Care Team: Glean Hess, MD as PCP - General (Internal Medicine)    Assessment:   This is a routine wellness examination for Tiyanna.  Exercise Activities and Dietary recommendations Current Exercise Habits: The patient does not participate in regular exercise at present, Exercise limited by: cardiac condition(s)  Goals    . DIET - INCREASE WATER INTAKE     Recommend drinking 6-8 glasses of water per day        Fall Risk Fall Risk  09/20/2018 06/23/2018 06/20/2017 01/18/2017 02/16/2016  Falls in the past year? 0 No No No Yes  Number falls in past yr: 0 - - - 1  Injury with Fall? - - - - Yes   FALL RISK PREVENTION PERTAINING TO THE HOME:  Any stairs in or around the home WITH handrails? Yes  Home free of loose throw rugs in walkways, pet beds, electrical cords, etc? Yes  - pt has boxes and items throughout house due to significant other but she is aware they need  to be removed for fall safety Adequate lighting in your home to reduce risk of falls? Yes   ASSISTIVE DEVICES UTILIZED TO PREVENT FALLS:  Life alert? No  Use of a cane, walker or w/c? No  Grab bars in the bathroom? No  Shower chair or bench in shower? No  Elevated toilet seat or a handicapped toilet? No   DME ORDERS:  DME order needed?  No   TIMED UP AND GO:  Was the test performed? Yes .  Length of time to ambulate 10 feet: 6 sec.   GAIT:  Appearance of gait: Gait stead-fast and without the use of an assistive device.  Education: Fall risk prevention has been discussed.  Intervention(s) required? Yes - remove items in walkway that are fall hazards   Depression Screen PHQ 2/9 Scores 09/20/2018 08/11/2018 04/24/2018 10/05/2017  PHQ - 2 Score 3 0 0 0  PHQ- 9 Score 9 0 0 0     Cognitive Function     6CIT Screen 06/20/2017  What Year? 0 points  What month? 0 points  What time? 0 points  Count back from 20 0 points  Months in reverse 0 points  Repeat phrase 2 points  Total Score 2    Immunization History  Administered Date(s) Administered  . Influenza, High Dose Seasonal PF 06/20/2017, 06/23/2018  . Influenza,inj,Quad PF,6+ Mos 08/14/2015, 09/27/2016  . Pneumococcal Conjugate-13 02/16/2016  . Pneumococcal Polysaccharide-23 11/15/2008, 10/14/2014    Qualifies for Shingles Vaccine? Yes . Due for Shingrix. Education has been provided regarding the importance of this vaccine. Pt has been advised to call insurance company to determine out of pocket expense. Advised may also receive vaccine at local pharmacy or Health Dept. Verbalized acceptance and understanding.  Tdap: Up to date   Flu Vaccine: Up to date   Pneumococcal Vaccine: Up to date  Screening Tests  Health Maintenance  Topic Date Due  . MAMMOGRAM  09/23/2018  . HEMOGLOBIN A1C  02/09/2019  . FOOT EXAM  06/24/2019  . OPHTHALMOLOGY EXAM  08/09/2019  . COLONOSCOPY  09/10/2022  . TETANUS/TDAP  06/12/2023    . INFLUENZA VACCINE  Completed  . DEXA SCAN  Completed  . Hepatitis C Screening  Completed  . PNA vac Low Risk Adult  Completed    Cancer Screenings:  Colorectal Screening: Completed 09/10/12. Repeat every 10 years;   Mammogram: Completed 09/23/17.  Ordered today. Pt provided with contact information and advised to call to schedule appt.   Bone Density: Completed 2013 per pt..  Repeat every 2 years. Ordered today. Pt provided with contact information and advised to call to schedule appt.   Lung Cancer Screening: (Low Dose CT Chest recommended if Age 51-80 years, 30 pack-year currently smoking OR have quit w/in 15years.) does not qualify.   Additional Screening:  Hepatitis C Screening: does qualify; Completed 08/11/18  Vision Screening: Recommended annual ophthalmology exams for early detection of glaucoma and other disorders of the eye. Is the patient up to date with their annual eye exam?  Yes  Who is the provider or what is the name of the office in which the pt attends annual eye exams? [facility]  Dental Screening: Recommended annual dental exams for proper oral hygiene  Community Resource Referral:  CRR required this visit?  No      Plan:    I have personally reviewed and addressed the Medicare Annual Wellness questionnaire and have noted the following in the patient's chart:  A. Medical and social history B. Use of alcohol, tobacco or illicit drugs  C. Current medications and supplements D. Functional ability and status E.  Nutritional status F.  Physical activity G. Advance directives H. List of other physicians I.  Hospitalizations, surgeries, and ER visits in previous 12 months J.  [place] such as hearing and vision if needed, cognitive and depression L. Referrals and appointments   In addition, I have reviewed and discussed with patient certain preventive protocols, quality metrics, and best practice recommendations. A written personalized care  plan for preventive services as well as general preventive health recommendations were provided to patient.   Signed,  Clemetine Marker, LPN Nurse Health Advisor   Nurse Notes: pt is participating in a trial drug study for heart patients with diabetes through [employer]. Trial drug information listed in med profile (pt may be receiving drug or placebo) She wants to get her A1C down but has been craving sweets lately, does not exercise and states she knows what she needs to do but it's just a matter of doing it. PHQ9 score of 9 today. Pt states she feels depressed but feels well controlled on her medication. She also feels tired and has had more of an appetite but no weight gain since last visit. Pt appreciative of wellness visit today.

## 2018-11-01 ENCOUNTER — Other Ambulatory Visit: Payer: Self-pay | Admitting: Internal Medicine

## 2018-11-08 DIAGNOSIS — H2513 Age-related nuclear cataract, bilateral: Secondary | ICD-10-CM | POA: Insufficient documentation

## 2018-11-08 DIAGNOSIS — H40003 Preglaucoma, unspecified, bilateral: Secondary | ICD-10-CM | POA: Diagnosis not present

## 2018-11-13 ENCOUNTER — Ambulatory Visit
Admission: RE | Admit: 2018-11-13 | Discharge: 2018-11-13 | Disposition: A | Discharge status: Home / Self Care | Payer: Medicare HMO | Source: Ambulatory Visit | Attending: Internal Medicine | Admitting: Internal Medicine

## 2018-11-13 DIAGNOSIS — Z1231 Encounter for screening mammogram for malignant neoplasm of breast: Secondary | ICD-10-CM | POA: Diagnosis not present

## 2018-11-13 DIAGNOSIS — Z780 Asymptomatic menopausal state: Secondary | ICD-10-CM | POA: Diagnosis not present

## 2018-11-17 DIAGNOSIS — Z794 Long term (current) use of insulin: Secondary | ICD-10-CM | POA: Diagnosis not present

## 2018-11-17 DIAGNOSIS — E039 Hypothyroidism, unspecified: Secondary | ICD-10-CM | POA: Diagnosis not present

## 2018-11-17 DIAGNOSIS — E1169 Type 2 diabetes mellitus with other specified complication: Secondary | ICD-10-CM | POA: Diagnosis not present

## 2018-11-17 DIAGNOSIS — I10 Essential (primary) hypertension: Secondary | ICD-10-CM | POA: Diagnosis not present

## 2018-11-17 DIAGNOSIS — F329 Major depressive disorder, single episode, unspecified: Secondary | ICD-10-CM | POA: Diagnosis not present

## 2018-11-21 ENCOUNTER — Other Ambulatory Visit: Payer: Self-pay | Admitting: Internal Medicine

## 2018-11-23 ENCOUNTER — Encounter: Payer: Self-pay | Admitting: Internal Medicine

## 2018-11-23 DIAGNOSIS — I10 Essential (primary) hypertension: Secondary | ICD-10-CM | POA: Diagnosis not present

## 2018-11-23 DIAGNOSIS — Z7984 Long term (current) use of oral hypoglycemic drugs: Secondary | ICD-10-CM | POA: Diagnosis not present

## 2018-11-23 DIAGNOSIS — Z7982 Long term (current) use of aspirin: Secondary | ICD-10-CM | POA: Diagnosis not present

## 2018-11-23 DIAGNOSIS — F419 Anxiety disorder, unspecified: Secondary | ICD-10-CM | POA: Diagnosis not present

## 2018-11-23 DIAGNOSIS — Z951 Presence of aortocoronary bypass graft: Secondary | ICD-10-CM | POA: Diagnosis not present

## 2018-11-23 DIAGNOSIS — H2513 Age-related nuclear cataract, bilateral: Secondary | ICD-10-CM | POA: Diagnosis not present

## 2018-11-23 DIAGNOSIS — H2511 Age-related nuclear cataract, right eye: Secondary | ICD-10-CM | POA: Diagnosis not present

## 2018-11-23 DIAGNOSIS — E1136 Type 2 diabetes mellitus with diabetic cataract: Secondary | ICD-10-CM | POA: Diagnosis not present

## 2018-11-23 DIAGNOSIS — Z87891 Personal history of nicotine dependence: Secondary | ICD-10-CM | POA: Diagnosis not present

## 2018-11-23 DIAGNOSIS — I2510 Atherosclerotic heart disease of native coronary artery without angina pectoris: Secondary | ICD-10-CM | POA: Diagnosis not present

## 2018-11-23 HISTORY — PX: CATARACT EXTRACTION, BILATERAL: SHX1313

## 2018-12-13 ENCOUNTER — Encounter: Payer: Self-pay | Admitting: Internal Medicine

## 2018-12-13 ENCOUNTER — Ambulatory Visit (INDEPENDENT_AMBULATORY_CARE_PROVIDER_SITE_OTHER): Payer: Medicare HMO | Admitting: Internal Medicine

## 2018-12-13 ENCOUNTER — Other Ambulatory Visit: Payer: Self-pay

## 2018-12-13 VITALS — BP 124/68 | HR 87 | Ht 66.0 in | Wt 182.0 lb

## 2018-12-13 DIAGNOSIS — Z794 Long term (current) use of insulin: Secondary | ICD-10-CM | POA: Diagnosis not present

## 2018-12-13 DIAGNOSIS — E1129 Type 2 diabetes mellitus with other diabetic kidney complication: Secondary | ICD-10-CM | POA: Diagnosis not present

## 2018-12-13 DIAGNOSIS — F3341 Major depressive disorder, recurrent, in partial remission: Secondary | ICD-10-CM | POA: Diagnosis not present

## 2018-12-13 DIAGNOSIS — R809 Proteinuria, unspecified: Secondary | ICD-10-CM

## 2018-12-13 DIAGNOSIS — I10 Essential (primary) hypertension: Secondary | ICD-10-CM

## 2018-12-13 MED ORDER — GLIMEPIRIDE 2 MG PO TABS: 2.0000 mg | ORAL_TABLET | Freq: Every day | ORAL | 1 refills | Status: DC

## 2018-12-13 NOTE — Progress Notes (Signed)
Date:  12/13/2018   Name:  Angela Rose   DOB:  1945-12-06   MRN:  [id number]   Chief Complaint: No chief complaint on file.  Diabetes  She presents for her follow-up diabetic visit. She has type 2 diabetes mellitus. Her disease course has been stable. Pertinent negatives for hypoglycemia include no headaches or tremors. Pertinent negatives for diabetes include no chest pain, no fatigue, no polydipsia and no polyuria. Her weight is stable. An ACE inhibitor/angiotensin II receptor blocker is being taken.  Hypertension  This is a chronic problem. The problem is controlled. Pertinent negatives include no chest pain, headaches, palpitations or shortness of breath. There are no compliance problems.    Lab Results  Component Value Date   HGBA1C 8.9 (H) 08/11/2018    Review of Systems  Constitutional: Negative for appetite change, fatigue, fever and unexpected weight change.  HENT: Negative for tinnitus and trouble swallowing.   Eyes: Negative for visual disturbance.  Respiratory: Negative for cough, chest tightness and shortness of breath.   Cardiovascular: Negative for chest pain, palpitations and leg swelling.  Gastrointestinal: Negative for abdominal pain.  Endocrine: Negative for polydipsia and polyuria.  Genitourinary: Negative for dysuria and hematuria.  Musculoskeletal: Negative for arthralgias.  Skin: Negative for color change and rash.  Neurological: Negative for tremors, numbness and headaches.  Psychiatric/Behavioral: Negative for dysphoric mood.    Patient Active Problem List   Diagnosis Date Noted  . OAB (overactive bladder) 08/11/2018  . Tinnitus aurium, bilateral 08/11/2018  . Renal cyst, right 05/01/2018  . 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  . Other tear of medial meniscus, current injury, left knee, initial encounter 12/31/2015  . Fracture of tibial plateau 12/31/2015  . DM (diabetes mellitus), type 2 with renal complications ([place]) 53/66/4403  .  Hyperlipidemia associated with type 2 diabetes mellitus ([place]) 08/14/2015  . Coronary artery disease involving native coronary artery of native heart without angina pectoris 08/14/2015  . Basal cell carcinoma 08/11/2015  . History of melanoma 08/11/2015  . Major depression in partial remission ([place]) 08/08/2015  . Non-proliferative diabetic retinopathy, both eyes ([place]) 08/08/2015  . History of colon polyps 08/08/2015  . Hypothyroidism, postablative 08/08/2015  . Carotid artery plaque 09/03/2014  . Essential (primary) hypertension 09/03/2014  . APC (atrial premature contractions) 09/03/2014    Allergies  Allergen Reactions  . Propoxyphene Nausea And Vomiting    Other reaction(s): Vomiting  . Lidocaine-Epinephrine     Other reaction(s): Other (See Comments) palpitatins and chest pain  . Other Other (See Comments)    sutures - in her hand-very irritated  . Tape Rash    BANDAIDS. BANDAIDS  . Xylocaine  [Lidocaine Hcl]     Other reaction(s): Other (See Comments) palpitatins and chest pain    Past Surgical History:  Procedure Laterality Date  . ABDOMINAL HYSTERECTOMY    . APPENDECTOMY    . BASAL CELL CARCINOMA EXCISION    . BREAST BIOPSY Right    needle bx-neg  . CATARACT EXTRACTION, BILATERAL Bilateral 11/23/2018  . CORONARY ARTERY BYPASS GRAFT  1996   x 3  . MELANOMA EXCISION    . PARTIAL HYSTERECTOMY  1976   ovaries remain    Social History   Tobacco Use  . Smoking status: Former Smoker    Last attempt to quit: 06/11/1982    Years since quitting: 36.5  . Smokeless tobacco: Never Used  . Tobacco comment: smoking cessation materials not required  Substance Use Topics  . Alcohol use: Yes  Alcohol/week: 0.0 standard drinks    Comment: rare 1 wine glass of wine  . Drug use: No     Medication list has been reviewed and updated.  Current Meds  Medication Sig  . amLODipine (NORVASC) 2.5 MG tablet TAKE 1 TABLET EVERY DAY  . aspirin 81 MG tablet Take by mouth.  .  enalapril (VASOTEC) 5 MG tablet TAKE 1 TABLET TWICE DAILY  . FLUoxetine (PROZAC) 40 MG capsule TAKE 1 CAPSULE EVERY DAY  . glucose blood (ONE TOUCH ULTRA TEST) test strip Frequency:PHARMDIR   Dosage:0.0     Instructions:  Note:testing 3-4xqd, DX Code 250.00 Dose: 1  . hydrochlorothiazide (HYDRODIURIL) 25 MG tablet TAKE 1 TABLET EVERY DAY  . Insulin Detemir (LEVEMIR FLEXTOUCH) 100 UNIT/ML Pen Inject 26 Units into the skin 2 (two) times daily.  . Insulin Pen Needle (BD PEN NEEDLE NANO U/F) 32G X 4 MM MISC 1 each by Other route 2 (two) times daily.  . Investigational - Study Medication Study name: Semaglutide 3 mg or placebo tablets - 7 tablets  . levothyroxine (SYNTHROID, LEVOTHROID) 25 MCG tablet TAKE 1 TABLET  DAILY BEFORE BREAKFAST  . meloxicam (MOBIC) 15 MG tablet Take 1 tablet (15 mg total) by mouth daily.  . metFORMIN (GLUCOPHAGE-XR) 500 MG 24 hr tablet Take 2 tablets (1,000 mg total) by mouth 2 (two) times daily.  . pravastatin (PRAVACHOL) 40 MG tablet TAKE 1 TABLET AT BEDTIME    PHQ 2/9 Scores 12/13/2018 09/20/2018 08/11/2018 04/24/2018  PHQ - 2 Score 1 3 0 0  PHQ- 9 Score - 9 0 0   Wt Readings from Last 3 Encounters:  12/13/18 182 lb (82.6 kg)  09/20/18 179 lb 12.8 oz (81.6 kg)  08/11/18 182 lb 3.2 oz (82.6 kg)    Physical Exam Vitals signs and nursing note reviewed.  Constitutional:      General: She is not in acute distress.    Appearance: She is well-developed.  HENT:     Head: Normocephalic and atraumatic.  Eyes:     Pupils: Pupils are equal, round, and reactive to light.  Neck:     Musculoskeletal: Normal range of motion and neck supple.     Vascular: No carotid bruit.  Cardiovascular:     Rate and Rhythm: Normal rate and regular rhythm.     Heart sounds: No murmur.  Pulmonary:     Effort: Pulmonary effort is normal. No respiratory distress.     Breath sounds: No wheezing or rhonchi.  Musculoskeletal: Normal range of motion.     Right lower leg: No edema.     Left  lower leg: No edema.  Skin:    General: Skin is warm and dry.     Findings: No rash.  Neurological:     Mental Status: She is alert and oriented to person, place, and time.  Psychiatric:        Behavior: Behavior normal.        Thought Content: Thought content normal.     BP 124/68   Pulse 87   Ht 5\' 6"  (1.676 m)   Wt 182 lb (82.6 kg)   SpO2 95%   BMI 29.38 kg/m   Assessment and Plan: 1. Type 2 diabetes mellitus with microalbuminuria, with long-term current use of insulin (HCC) Not controlled, due to fianances will resume SNU - glimepiride (AMARYL) 2 MG tablet; Take 1 tablet (2 mg total) by mouth daily before breakfast.  Dispense: 90 tablet; Refill: 1 - Hemoglobin A1c  2.  Recurrent major depressive disorder, in partial remission ([place]) Doing well  3. Essential (primary) hypertension controlled   Partially dictated using Editor, commissioning. Any errors are unintentional.  Halina Maidens, MD [facility]  12/13/2018

## 2018-12-14 DIAGNOSIS — I252 Old myocardial infarction: Secondary | ICD-10-CM | POA: Diagnosis not present

## 2018-12-14 DIAGNOSIS — I10 Essential (primary) hypertension: Secondary | ICD-10-CM | POA: Diagnosis not present

## 2018-12-14 DIAGNOSIS — Z951 Presence of aortocoronary bypass graft: Secondary | ICD-10-CM | POA: Diagnosis not present

## 2018-12-14 DIAGNOSIS — Z87891 Personal history of nicotine dependence: Secondary | ICD-10-CM | POA: Diagnosis not present

## 2018-12-14 DIAGNOSIS — E119 Type 2 diabetes mellitus without complications: Secondary | ICD-10-CM | POA: Diagnosis not present

## 2018-12-14 DIAGNOSIS — I2510 Atherosclerotic heart disease of native coronary artery without angina pectoris: Secondary | ICD-10-CM | POA: Diagnosis not present

## 2018-12-14 DIAGNOSIS — E039 Hypothyroidism, unspecified: Secondary | ICD-10-CM | POA: Diagnosis not present

## 2018-12-14 DIAGNOSIS — H2513 Age-related nuclear cataract, bilateral: Secondary | ICD-10-CM | POA: Diagnosis not present

## 2018-12-14 DIAGNOSIS — Z7982 Long term (current) use of aspirin: Secondary | ICD-10-CM | POA: Diagnosis not present

## 2018-12-14 DIAGNOSIS — H2512 Age-related nuclear cataract, left eye: Secondary | ICD-10-CM | POA: Diagnosis not present

## 2018-12-14 LAB — HEMOGLOBIN A1C
Est. average glucose Bld gHb Est-mCnc: 217 mg/dL
Hgb A1c MFr Bld: 9.2 % — ABNORMAL HIGH (ref 4.8–5.6)

## 2018-12-25 ENCOUNTER — Encounter: Payer: Self-pay | Admitting: Internal Medicine

## 2018-12-25 ENCOUNTER — Other Ambulatory Visit: Payer: Self-pay

## 2018-12-25 ENCOUNTER — Ambulatory Visit (INDEPENDENT_AMBULATORY_CARE_PROVIDER_SITE_OTHER): Payer: Medicare HMO | Admitting: Internal Medicine

## 2018-12-25 VITALS — BP 122/60 | HR 89 | Resp 16 | Ht 66.0 in | Wt 180.2 lb

## 2018-12-25 DIAGNOSIS — I10 Essential (primary) hypertension: Secondary | ICD-10-CM | POA: Diagnosis not present

## 2018-12-25 DIAGNOSIS — E785 Hyperlipidemia, unspecified: Secondary | ICD-10-CM

## 2018-12-25 DIAGNOSIS — I499 Cardiac arrhythmia, unspecified: Secondary | ICD-10-CM | POA: Diagnosis not present

## 2018-12-25 DIAGNOSIS — Z0000 Encounter for general adult medical examination without abnormal findings: Secondary | ICD-10-CM | POA: Diagnosis not present

## 2018-12-25 DIAGNOSIS — I4891 Unspecified atrial fibrillation: Secondary | ICD-10-CM | POA: Insufficient documentation

## 2018-12-25 DIAGNOSIS — E1169 Type 2 diabetes mellitus with other specified complication: Secondary | ICD-10-CM

## 2018-12-25 LAB — POCT URINALYSIS DIPSTICK
Bilirubin, UA: NEGATIVE
Blood, UA: NEGATIVE
GLUCOSE UA: NEGATIVE
Ketones, UA: NEGATIVE
NITRITE UA: NEGATIVE
PROTEIN UA: NEGATIVE
SPEC GRAV UA: 1.015 (ref 1.010–1.025)
Urobilinogen, UA: 0.2 E.U./dL
pH, UA: 7 (ref 5.0–8.0)

## 2018-12-25 NOTE — Progress Notes (Signed)
Date:  12/25/2018   Name:  Angela Rose   DOB:  05/03/1946   MRN:  [id number]   Chief Complaint: Annual Exam (FYI: BS ranges lower than 100 since new med. Had 2 lows below 12.) Angela Rose is a 73 y.o. female who presents today for her Complete Annual Exam. She feels fairly well. She reports exercising none. She reports she is sleeping fairly well.   Last mammogram 11/13/2018 Last colonoscopy 2013 Last DEXA  11/13/2018 Pneumonia vaccines up to date No record of Shingles vaccine No record of Tdap  Diabetes  She presents for her follow-up diabetic visit. She has type 2 diabetes mellitus. Her disease course has been improving. Pertinent negatives for hypoglycemia include no dizziness, headaches, nervousness/anxiousness or tremors. Pertinent negatives for diabetes include no chest pain, no fatigue, no polydipsia and no polyuria. Her weight is stable. She is following a generally healthy diet. Her home blood glucose trend is decreasing steadily. An ACE inhibitor/angiotensin II receptor blocker is being taken. Eye exam is current.  Hyperlipidemia  This is a chronic problem. The problem is controlled. Pertinent negatives include no chest pain or shortness of breath. Current antihyperlipidemic treatment includes statins. The current treatment provides significant improvement of lipids.  Thyroid Problem  Patient reports no anxiety, constipation, diarrhea, fatigue, palpitations or tremors. Her past medical history is significant for hyperlipidemia.  CAD - s/p CABG.  No chest pains. She says that her heart rate is persistently erratic but not Afib.  She is also getting EKG regularly at [facility] DM study.  Lab Results  Component Value Date   TSH 4.060 05/20/2017   Lab Results  Component Value Date   CHOL 231 (A) 02/04/2018   HDL 60 02/04/2018   LDLCALC 135 02/04/2018   TRIG 180 (A) 02/04/2018   Lab Results  Component Value Date   CREATININE 1.09 (H) 08/11/2018   BUN 21 08/11/2018    NA 140 08/11/2018   K 4.3 08/11/2018   CL 100 08/11/2018   CO2 21 08/11/2018   Lab Results  Component Value Date   HGBA1C 9.2 (H) 12/13/2018     Review of Systems  Constitutional: Negative for chills, fatigue and fever.  HENT: Negative for congestion, hearing loss, tinnitus, trouble swallowing and voice change.   Eyes: Negative for visual disturbance.  Respiratory: Negative for cough, chest tightness, shortness of breath and wheezing.   Cardiovascular: Negative for chest pain, palpitations and leg swelling.  Gastrointestinal: Negative for abdominal pain, constipation, diarrhea and vomiting.  Endocrine: Negative for polydipsia and polyuria.  Genitourinary: Negative for dysuria, frequency, genital sores, vaginal bleeding and vaginal discharge.  Musculoskeletal: Negative for arthralgias, gait problem and joint swelling.  Skin: Negative for color change and rash.  Neurological: Negative for dizziness, tremors, light-headedness and headaches.  Hematological: Negative for adenopathy. Does not bruise/bleed easily.  Psychiatric/Behavioral: Negative for dysphoric mood and sleep disturbance. The patient is not nervous/anxious.     Patient Active Problem List   Diagnosis Date Noted  . OAB (overactive bladder) 08/11/2018  . Tinnitus aurium, bilateral 08/11/2018  . Renal cyst, right 05/01/2018  . 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  . Other tear of medial meniscus, current injury, left knee, initial encounter 12/31/2015  . Fracture of tibial plateau 12/31/2015  . DM (diabetes mellitus), type 2 with renal complications ([place]) 67/89/3810  . Hyperlipidemia associated with type 2 diabetes mellitus ([place]) 08/14/2015  . Coronary artery disease involving native coronary artery of native heart without angina pectoris 08/14/2015  .  Basal cell carcinoma 08/11/2015  . History of melanoma 08/11/2015  . Major depression in partial remission ([place]) 08/08/2015  . Non-proliferative diabetic retinopathy, both  eyes ([place]) 08/08/2015  . History of colon polyps 08/08/2015  . Hypothyroidism, postablative 08/08/2015  . Carotid artery plaque 09/03/2014  . Essential (primary) hypertension 09/03/2014  . APC (atrial premature contractions) 09/03/2014    Allergies  Allergen Reactions  . Propoxyphene Nausea And Vomiting    Other reaction(s): Vomiting  . Lidocaine-Epinephrine     Other reaction(s): Other (See Comments) palpitatins and chest pain  . Other Other (See Comments)    sutures - in her hand-very irritated  . Tape Rash    BANDAIDS. BANDAIDS  . Xylocaine  [Lidocaine Hcl]     Other reaction(s): Other (See Comments) palpitatins and chest pain    Past Surgical History:  Procedure Laterality Date  . ABDOMINAL HYSTERECTOMY    . APPENDECTOMY    . BASAL CELL CARCINOMA EXCISION    . BREAST BIOPSY Right    needle bx-neg  . CATARACT EXTRACTION, BILATERAL Bilateral 11/23/2018  . CORONARY ARTERY BYPASS GRAFT  1996   x 3  . MELANOMA EXCISION    . PARTIAL HYSTERECTOMY  1976   ovaries remain    Social History   Tobacco Use  . Smoking status: Former Smoker    Last attempt to quit: 06/11/1982    Years since quitting: 36.5  . Smokeless tobacco: Never Used  . Tobacco comment: smoking cessation materials not required  Substance Use Topics  . Alcohol use: Yes    Alcohol/week: 0.0 standard drinks    Comment: rare 1 wine glass of wine  . Drug use: No     Medication list has been reviewed and updated.  Current Meds  Medication Sig  . amLODipine (NORVASC) 2.5 MG tablet TAKE 1 TABLET EVERY DAY  . aspirin 81 MG tablet Take by mouth.  . enalapril (VASOTEC) 5 MG tablet TAKE 1 TABLET TWICE DAILY  . FLUoxetine (PROZAC) 40 MG capsule TAKE 1 CAPSULE EVERY DAY  . glimepiride (AMARYL) 2 MG tablet Take 1 tablet (2 mg total) by mouth daily before breakfast.  . glucose blood (ONE TOUCH ULTRA TEST) test strip Frequency:PHARMDIR   Dosage:0.0     Instructions:  Note:testing 3-4xqd, DX Code 250.00 Dose: 1   . hydrochlorothiazide (HYDRODIURIL) 25 MG tablet TAKE 1 TABLET EVERY DAY  . Insulin Detemir (LEVEMIR FLEXTOUCH) 100 UNIT/ML Pen Inject 26 Units into the skin 2 (two) times daily.  . Insulin Pen Needle (BD PEN NEEDLE NANO U/F) 32G X 4 MM MISC 1 each by Other route 2 (two) times daily.  . Investigational - Study Medication Study name: Semaglutide 3 mg or placebo tablets - 7 tablets  . levothyroxine (SYNTHROID, LEVOTHROID) 25 MCG tablet TAKE 1 TABLET  DAILY BEFORE BREAKFAST  . meloxicam (MOBIC) 15 MG tablet Take 1 tablet (15 mg total) by mouth daily.  . metFORMIN (GLUCOPHAGE-XR) 500 MG 24 hr tablet Take 2 tablets (1,000 mg total) by mouth 2 (two) times daily.  . pravastatin (PRAVACHOL) 40 MG tablet TAKE 1 TABLET AT BEDTIME    PHQ 2/9 Scores 12/25/2018 12/13/2018 09/20/2018 08/11/2018  PHQ - 2 Score 2 1 3  0  PHQ- 9 Score 5 - 9 0    Physical Exam Vitals signs and nursing note reviewed.  Constitutional:      General: She is not in acute distress.    Appearance: She is well-developed.  HENT:     Head: Normocephalic and  atraumatic.     Right Ear: Tympanic membrane and ear canal normal.     Left Ear: Tympanic membrane and ear canal normal.     Nose:     Right Sinus: No maxillary sinus tenderness.     Left Sinus: No maxillary sinus tenderness.     Mouth/Throat:     Pharynx: Uvula midline.  Eyes:     General: No scleral icterus.       Right eye: No discharge.        Left eye: No discharge.     Conjunctiva/sclera: Conjunctivae normal.  Neck:     Musculoskeletal: Normal range of motion. No erythema.     Thyroid: No thyromegaly.     Vascular: No carotid bruit.  Cardiovascular:     Rate and Rhythm: Normal rate. Rhythm irregular.     Pulses: Normal pulses.     Heart sounds: Normal heart sounds. No murmur.  Pulmonary:     Effort: Pulmonary effort is normal. No respiratory distress.     Breath sounds: No wheezing.  Chest:     Comments: Pt declines breast exam due to recent normal mammogram  Abdominal:     General: Bowel sounds are normal.     Palpations: Abdomen is soft.     Tenderness: There is no abdominal tenderness.  Musculoskeletal: Normal range of motion.     Right lower leg: No edema.     Left lower leg: No edema.  Lymphadenopathy:     Cervical: No cervical adenopathy.  Skin:    General: Skin is warm and dry.     Findings: No rash.  Neurological:     Mental Status: She is alert and oriented to person, place, and time.     Cranial Nerves: No cranial nerve deficit.     Sensory: Sensation is intact. No sensory deficit.     Motor: Motor function is intact.     Deep Tendon Reflexes: Reflexes are normal and symmetric.  Psychiatric:        Attention and Perception: Attention normal.        Mood and Affect: Mood normal.        Speech: Speech normal.        Behavior: Behavior normal.        Thought Content: Thought content normal.      Wt Readings from Last 3 Encounters:  12/25/18 180 lb 3.2 oz (81.7 kg)  12/13/18 182 lb (82.6 kg)  09/20/18 179 lb 12.8 oz (81.6 kg)    BP 122/60 (BP Location: Right Arm, Patient Position: Sitting, Cuff Size: Normal)   Pulse 89   Resp 16   Ht 5\' 6"  (1.676 m)   Wt 180 lb 3.2 oz (81.7 kg)   SpO2 98%   BMI 29.09 kg/m   Assessment and Plan: 1. Annual physical exam Continue healthy diet, exercise as able - POCT urinalysis dipstick  2. Irregular heart rhythm Afib @ 74 - EKG 12-Lead - Afib @ 74, old inf infarct - CBC with Differential/Platelet - TSH  3. Essential (primary) hypertension controlled  4. Hyperlipidemia associated with type 2 diabetes mellitus ([place]) On statin therapy - Comprehensive metabolic panel - Lipid panel  5. Atrial fibrillation, unspecified type ([place]) Rate controlled, on aspirin CHADS score = 5 Pt reluctant to start blood thinner so will wait for cardiology  - Ambulatory referral to Cardiology   Partially dictated using Dragon software. Any errors are unintentional.  Halina Maidens, MD  [facility]  12/25/2018

## 2018-12-26 ENCOUNTER — Other Ambulatory Visit: Payer: Self-pay | Admitting: Internal Medicine

## 2018-12-26 DIAGNOSIS — E890 Postprocedural hypothyroidism: Secondary | ICD-10-CM

## 2018-12-26 DIAGNOSIS — E785 Hyperlipidemia, unspecified: Principal | ICD-10-CM

## 2018-12-26 DIAGNOSIS — E1169 Type 2 diabetes mellitus with other specified complication: Secondary | ICD-10-CM

## 2018-12-26 LAB — CBC WITH DIFFERENTIAL/PLATELET
BASOS ABS: 0.1 10*3/uL (ref 0.0–0.2)
Basos: 1 %
EOS (ABSOLUTE): 0.2 10*3/uL (ref 0.0–0.4)
Eos: 3 %
HEMATOCRIT: 37.6 % (ref 34.0–46.6)
Hemoglobin: 12.3 g/dL (ref 11.1–15.9)
Immature Grans (Abs): 0 10*3/uL (ref 0.0–0.1)
Immature Granulocytes: 0 %
Lymphocytes Absolute: 1.6 10*3/uL (ref 0.7–3.1)
Lymphs: 20 %
MCH: 28.8 pg (ref 26.6–33.0)
MCHC: 32.7 g/dL (ref 31.5–35.7)
MCV: 88 fL (ref 79–97)
MONOCYTES: 11 %
MONOS ABS: 0.8 10*3/uL (ref 0.1–0.9)
Neutrophils Absolute: 5.2 10*3/uL (ref 1.4–7.0)
Neutrophils: 65 %
Platelets: 352 10*3/uL (ref 150–450)
RBC: 4.27 x10E6/uL (ref 3.77–5.28)
RDW: 13.2 % (ref 11.7–15.4)
WBC: 7.9 10*3/uL (ref 3.4–10.8)

## 2018-12-26 LAB — COMPREHENSIVE METABOLIC PANEL
ALK PHOS: 82 IU/L (ref 39–117)
ALT: 15 IU/L (ref 0–32)
AST: 20 IU/L (ref 0–40)
Albumin/Globulin Ratio: 1.5 (ref 1.2–2.2)
Albumin: 4.3 g/dL (ref 3.7–4.7)
BUN/Creatinine Ratio: 19 (ref 12–28)
BUN: 23 mg/dL (ref 8–27)
Bilirubin Total: 0.2 mg/dL (ref 0.0–1.2)
CALCIUM: 9.4 mg/dL (ref 8.7–10.3)
CO2: 22 mmol/L (ref 20–29)
CREATININE: 1.24 mg/dL — AB (ref 0.57–1.00)
Chloride: 100 mmol/L (ref 96–106)
GFR calc Af Amer: 49 mL/min/{1.73_m2} — ABNORMAL LOW (ref >59)
GFR calc non Af Amer: 43 mL/min/{1.73_m2} — ABNORMAL LOW (ref >59)
GLOBULIN, TOTAL: 2.8 g/dL (ref 1.5–4.5)
Glucose: 176 mg/dL — ABNORMAL HIGH (ref 65–99)
Potassium: 4.3 mmol/L (ref 3.5–5.2)
SODIUM: 142 mmol/L (ref 134–144)
Total Protein: 7.1 g/dL (ref 6.0–8.5)

## 2018-12-26 LAB — LIPID PANEL
CHOL/HDL RATIO: 4.5 ratio — AB (ref 0.0–4.4)
CHOLESTEROL TOTAL: 223 mg/dL — AB (ref 100–199)
HDL: 50 mg/dL (ref >39)
LDL CALC: 129 mg/dL — AB (ref 0–99)
TRIGLYCERIDES: 222 mg/dL — AB (ref 0–149)
VLDL Cholesterol Cal: 44 mg/dL — ABNORMAL HIGH (ref 5–40)

## 2018-12-26 LAB — TSH: TSH: 4.69 u[IU]/mL — ABNORMAL HIGH (ref 0.450–4.500)

## 2018-12-26 MED ORDER — LEVOTHYROXINE SODIUM 50 MCG PO TABS: 50.0000 ug | ORAL_TABLET | Freq: Every day | ORAL | 1 refills | Status: DC

## 2018-12-26 MED ORDER — ATORVASTATIN CALCIUM 20 MG PO TABS
20.0000 mg | ORAL_TABLET | Freq: Every day | ORAL | 1 refills | Status: DC
Start: 2018-12-26 — End: 2019-06-29

## 2018-12-27 DIAGNOSIS — I10 Essential (primary) hypertension: Secondary | ICD-10-CM | POA: Diagnosis not present

## 2018-12-27 DIAGNOSIS — I6523 Occlusion and stenosis of bilateral carotid arteries: Secondary | ICD-10-CM | POA: Diagnosis not present

## 2018-12-27 DIAGNOSIS — I480 Paroxysmal atrial fibrillation: Secondary | ICD-10-CM | POA: Diagnosis not present

## 2018-12-27 DIAGNOSIS — E782 Mixed hyperlipidemia: Secondary | ICD-10-CM | POA: Diagnosis not present

## 2018-12-27 DIAGNOSIS — I25708 Atherosclerosis of coronary artery bypass graft(s), unspecified, with other forms of angina pectoris: Secondary | ICD-10-CM | POA: Diagnosis not present

## 2019-01-03 ENCOUNTER — Other Ambulatory Visit: Payer: Self-pay | Admitting: Internal Medicine

## 2019-01-03 DIAGNOSIS — I10 Essential (primary) hypertension: Secondary | ICD-10-CM

## 2019-01-09 DIAGNOSIS — I480 Paroxysmal atrial fibrillation: Secondary | ICD-10-CM | POA: Diagnosis not present

## 2019-01-09 DIAGNOSIS — I25708 Atherosclerosis of coronary artery bypass graft(s), unspecified, with other forms of angina pectoris: Secondary | ICD-10-CM | POA: Diagnosis not present

## 2019-01-10 DIAGNOSIS — I480 Paroxysmal atrial fibrillation: Secondary | ICD-10-CM | POA: Diagnosis not present

## 2019-01-12 ENCOUNTER — Other Ambulatory Visit: Payer: Self-pay | Admitting: Internal Medicine

## 2019-01-13 ENCOUNTER — Encounter: Payer: Self-pay | Admitting: Internal Medicine

## 2019-01-16 ENCOUNTER — Other Ambulatory Visit: Payer: Self-pay | Admitting: Internal Medicine

## 2019-01-16 DIAGNOSIS — E782 Mixed hyperlipidemia: Secondary | ICD-10-CM | POA: Diagnosis not present

## 2019-01-16 DIAGNOSIS — I25810 Atherosclerosis of coronary artery bypass graft(s) without angina pectoris: Secondary | ICD-10-CM | POA: Diagnosis not present

## 2019-01-16 DIAGNOSIS — I491 Atrial premature depolarization: Secondary | ICD-10-CM | POA: Diagnosis not present

## 2019-01-16 DIAGNOSIS — I480 Paroxysmal atrial fibrillation: Secondary | ICD-10-CM | POA: Diagnosis not present

## 2019-01-16 DIAGNOSIS — I10 Essential (primary) hypertension: Secondary | ICD-10-CM | POA: Diagnosis not present

## 2019-03-23 DIAGNOSIS — H40003 Preglaucoma, unspecified, bilateral: Secondary | ICD-10-CM | POA: Diagnosis not present

## 2019-03-23 DIAGNOSIS — Z961 Presence of intraocular lens: Secondary | ICD-10-CM | POA: Diagnosis not present

## 2019-04-20 IMAGING — MR MR BRAIN/IAC WO/W
12 of 13 series · 42 of 48 positions shown · IV contrast (agent unspecified)
Comparison: None.

CLINICAL DATA: Unilateral sensorineural hearing loss left ear with
restricted hearing loss contralateral side

EXAM:
MRI HEAD WITHOUT AND WITH CONTRAST
TECHNIQUE: Multiplanar, multiecho pulse sequences of the brain and surrounding
structures were obtained without and with intravenous contrast.
CONTRAST:  8.5 mL Gadovist IV

[Series 2: DWI · axial · 3.0mm · 1.20mm/px · z∈[-66,+95]mm · 12 of 109 slices shown (1 of 3)]
[im 1/109]
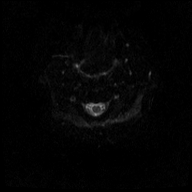
[im 10/109]
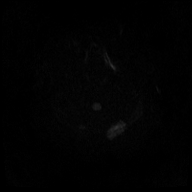
[im 20/109]
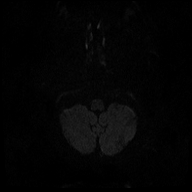
[im 30/109]
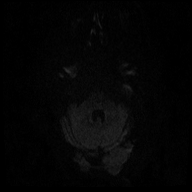
[im 40/109]
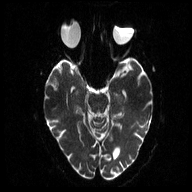
[im 50/109]
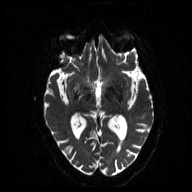
[im 59/109]
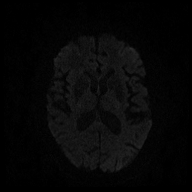
[im 69/109]
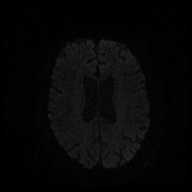
[im 79/109]
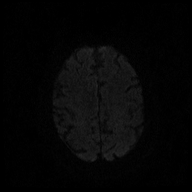
[im 89/109]
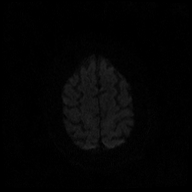
[im 99/109]
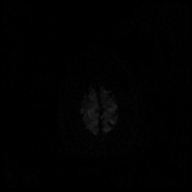
[im 109/109]
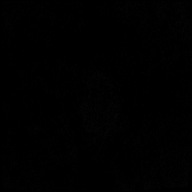

[Series 3: DWI · axial · 3.0mm · 1.20mm/px · z∈[-66,+95]mm · 5 of 55 slices shown (2 of 3)]
[im 1/55]
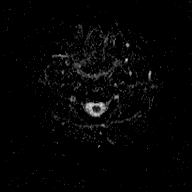
[im 14/55]
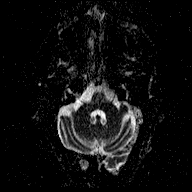
[im 28/55]
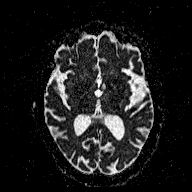
[im 41/55]
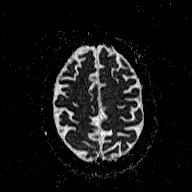
[im 55/55]
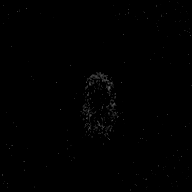

[Series 4: T1 · sagittal · 5.0mm · 0.45mm/px · 2 of 25 slices shown (1 of 3)]
[im 1/25]
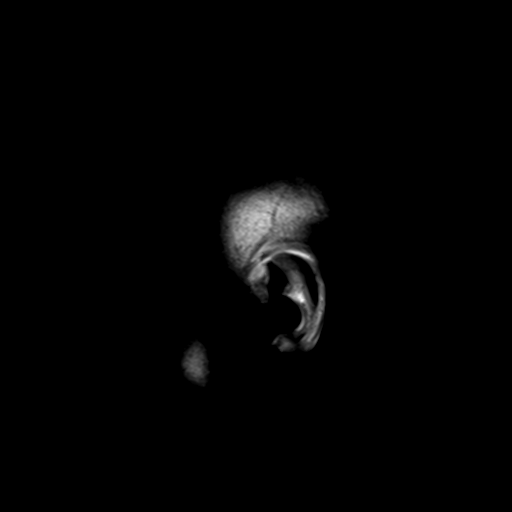
[im 25/25]
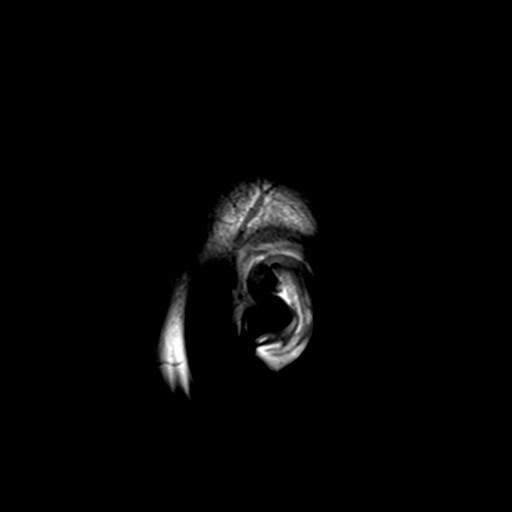

[Series 5: T2 · axial · 5.0mm · 0.72mm/px · z∈[-70,+98]mm · 3 of 27 slices shown (1 of 2)]
[im 1/27]
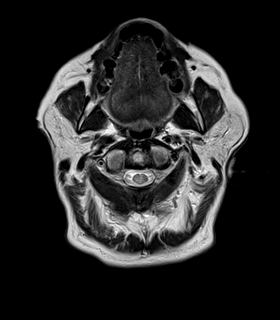
[im 14/27]
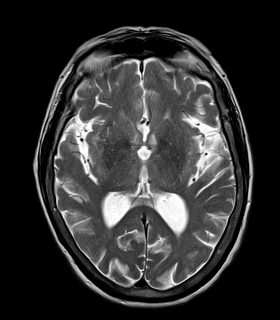
[im 27/27]
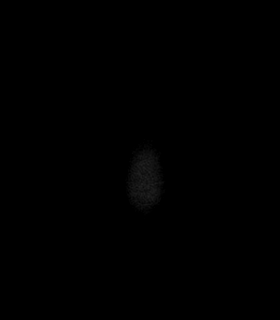

[Series 6: T2 · axial · 5.0mm · 0.72mm/px · z∈[-70,+98]mm · 3 of 27 slices shown (2 of 2)]
[im 1/27]
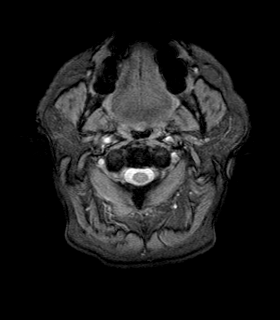
[im 14/27]
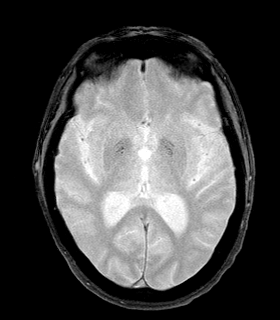
[im 27/27]
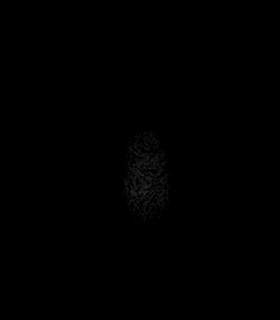

[Series 7: T1 · coronal · 3.0mm · 0.37mm/px · 1 of 11 slices shown (2 of 3)]
[im 1/11]
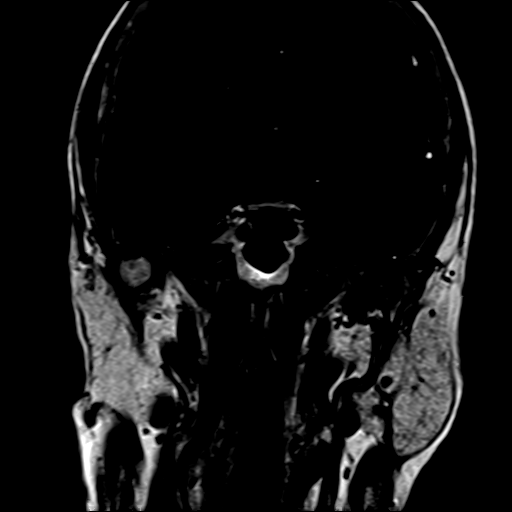

[Series 8: FLAIR · axial · 5.0mm · 0.45mm/px · z∈[-64,+92]mm · 2 of 25 slices shown]
[im 1/25]
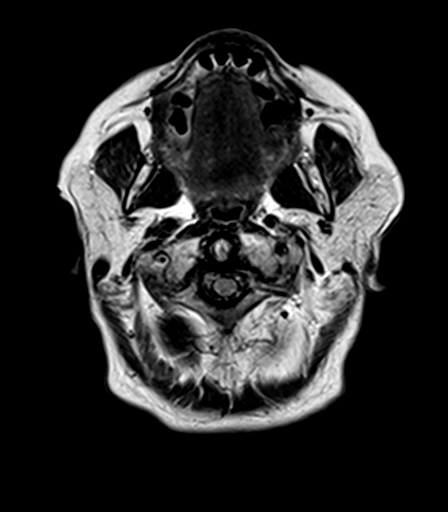
[im 25/25]
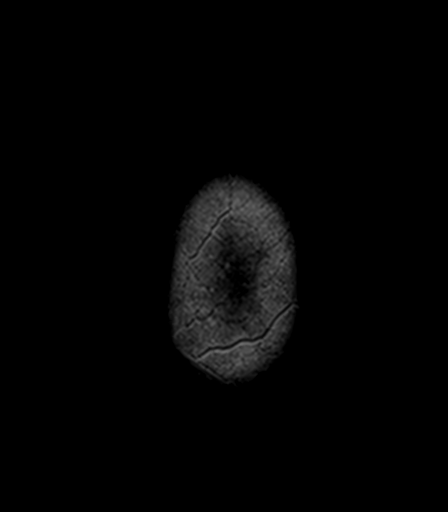

[Series 9: T1 · axial · 3.0mm · 0.37mm/px · 1 of 11 slices shown (3 of 3)]
[im 1/11]
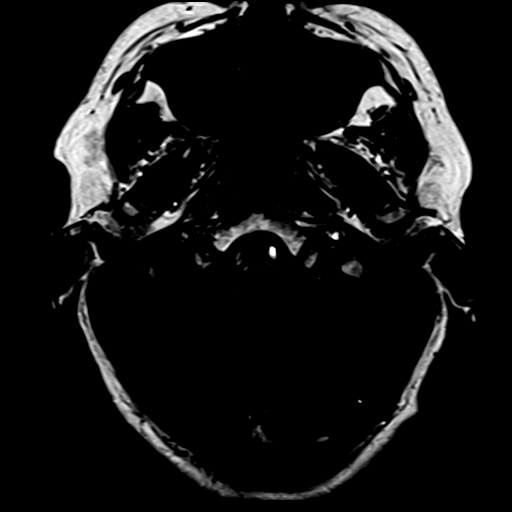

[Series 11: T1 post-contrast · axial · 3.0mm · 0.37mm/px · 1 of 11 slices shown (1 of 3)]
[im 1/11]
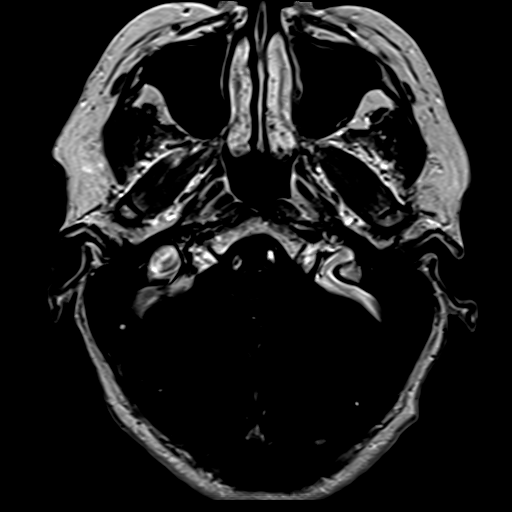

[Series 12: T1 post-contrast · coronal · 3.0mm · 0.37mm/px · 1 of 11 slices shown (2 of 3)]
[im 1/11]
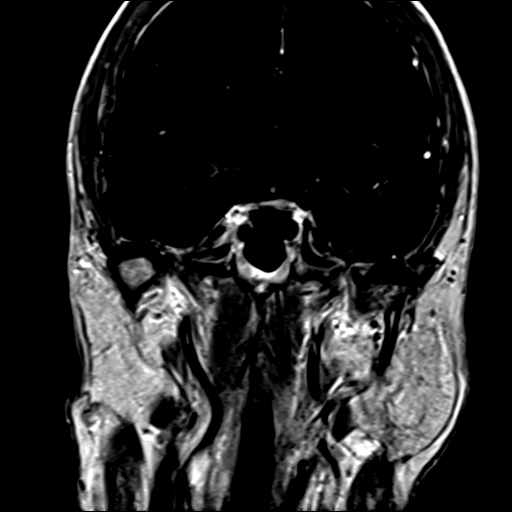

[Series 13: T1 post-contrast · axial · 3.0mm · 1.00mm/px · z∈[-69,+95]mm · 6 of 56 slices shown (3 of 3)]
[im 1/56]
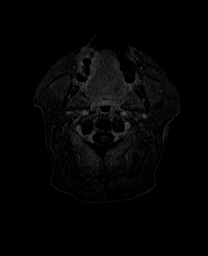
[im 12/56]
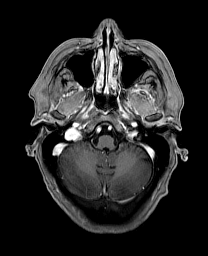
[im 23/56]
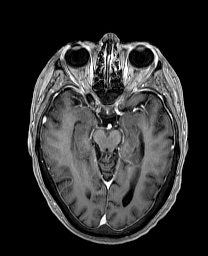
[im 34/56]
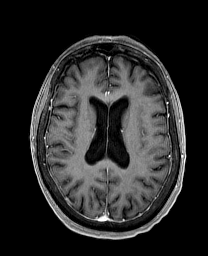
[im 45/56]
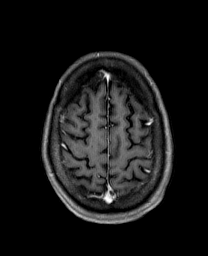
[im 56/56]
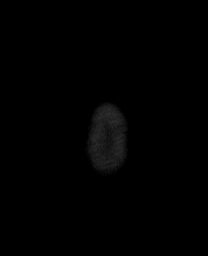

[Series 100: DWI · axial · 3.0mm · 1.20mm/px · z∈[-66,+95]mm · 5 of 55 slices shown (3 of 3)]
[im 1/55]
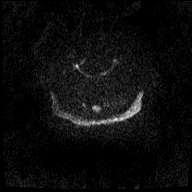
[im 14/55]
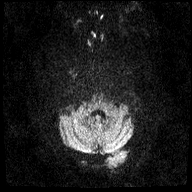
[im 28/55]
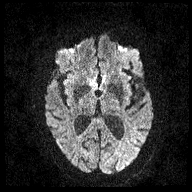
[im 41/55]
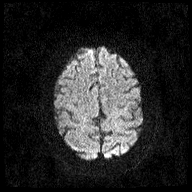
[im 55/55]
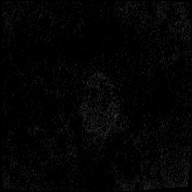

[42 of 48 positions shown; findings below may reference images not displayed]

FINDINGS: Brain: IAC protocol was performed including thin section imaging
through the posterior fossa before and after intravenous contrast.
Seventh and 8th cranial nerves are normal. Negative for vestibular
schwannoma. Basilar cisterns normal. Brainstem and cerebellum
normal. Mastoid sinus is clear bilaterally. No enhancing mass in the
posterior fossa or temporal bone.

Mild atrophy. Negative for hydrocephalus. No significant ischemic
change. Negative for hemorrhage mass or edema. Normal enhancement of
the brain.

Vascular: Normal arterial flow voids

Skull and upper cervical spine: Negative

Sinuses/Orbits: Mild mucosal edema paranasal sinuses.  Normal orbit

Other: None
IMPRESSION: No cause for hearing loss identified. Negative for vestibular
schwannoma

Normal for age MRI of the brain with contrast.

## 2019-04-21 ENCOUNTER — Other Ambulatory Visit: Payer: Self-pay | Admitting: Internal Medicine

## 2019-04-21 DIAGNOSIS — Z794 Long term (current) use of insulin: Secondary | ICD-10-CM

## 2019-04-21 DIAGNOSIS — E1129 Type 2 diabetes mellitus with other diabetic kidney complication: Secondary | ICD-10-CM

## 2019-05-18 ENCOUNTER — Other Ambulatory Visit: Payer: Self-pay | Admitting: Internal Medicine

## 2019-05-18 DIAGNOSIS — E890 Postprocedural hypothyroidism: Secondary | ICD-10-CM

## 2019-05-19 ENCOUNTER — Other Ambulatory Visit: Payer: Self-pay | Admitting: Internal Medicine

## 2019-05-24 ENCOUNTER — Other Ambulatory Visit: Payer: Self-pay | Admitting: Internal Medicine

## 2019-05-24 DIAGNOSIS — I10 Essential (primary) hypertension: Secondary | ICD-10-CM

## 2019-06-01 ENCOUNTER — Other Ambulatory Visit: Payer: Self-pay | Admitting: Internal Medicine

## 2019-06-27 ENCOUNTER — Ambulatory Visit: Payer: Medicare HMO | Admitting: Internal Medicine

## 2019-06-28 ENCOUNTER — Other Ambulatory Visit: Payer: Self-pay | Admitting: Internal Medicine

## 2019-06-28 DIAGNOSIS — E785 Hyperlipidemia, unspecified: Secondary | ICD-10-CM

## 2019-06-28 DIAGNOSIS — E1169 Type 2 diabetes mellitus with other specified complication: Secondary | ICD-10-CM

## 2019-07-05 ENCOUNTER — Ambulatory Visit: Payer: Medicare HMO | Admitting: Internal Medicine

## 2019-07-24 LAB — HEMOGLOBIN A1C: Hemoglobin A1C: 9.2

## 2019-07-25 ENCOUNTER — Encounter: Payer: Self-pay | Admitting: Internal Medicine

## 2019-07-25 ENCOUNTER — Ambulatory Visit (INDEPENDENT_AMBULATORY_CARE_PROVIDER_SITE_OTHER): Payer: Medicare HMO | Admitting: Internal Medicine

## 2019-07-25 ENCOUNTER — Other Ambulatory Visit: Payer: Self-pay

## 2019-07-25 VITALS — BP 132/78 | HR 85 | Ht 66.0 in | Wt 180.0 lb

## 2019-07-25 DIAGNOSIS — E1129 Type 2 diabetes mellitus with other diabetic kidney complication: Secondary | ICD-10-CM

## 2019-07-25 DIAGNOSIS — Z23 Encounter for immunization: Secondary | ICD-10-CM

## 2019-07-25 DIAGNOSIS — I10 Essential (primary) hypertension: Secondary | ICD-10-CM | POA: Diagnosis not present

## 2019-07-25 DIAGNOSIS — Z85820 Personal history of malignant melanoma of skin: Secondary | ICD-10-CM | POA: Diagnosis not present

## 2019-07-25 DIAGNOSIS — R809 Proteinuria, unspecified: Secondary | ICD-10-CM | POA: Diagnosis not present

## 2019-07-25 DIAGNOSIS — C4330 Malignant melanoma of unspecified part of face: Secondary | ICD-10-CM | POA: Insufficient documentation

## 2019-07-25 DIAGNOSIS — Z794 Long term (current) use of insulin: Secondary | ICD-10-CM | POA: Diagnosis not present

## 2019-07-25 NOTE — Progress Notes (Signed)
Date:  07/25/2019   Name:  Angela Rose   DOB:  10/11/1946   MRN:  [id number]   Chief Complaint: Hypertension (6 month follow up. High dose flu shot.) and Diabetes  Hypertension This is a chronic problem. The problem is controlled. Pertinent negatives include no chest pain, headaches, palpitations or shortness of breath. Past treatments include diuretics, ACE inhibitors and calcium channel blockers. The current treatment provides significant improvement.  Diabetes She presents for her follow-up diabetic visit. She has type 2 diabetes mellitus. Her disease course has been stable. Pertinent negatives for hypoglycemia include no headaches or tremors. Pertinent negatives for diabetes include no chest pain, no fatigue, no polydipsia and no polyuria. Symptoms are stable. Current diabetic treatment includes oral agent (monotherapy) and insulin injections (metformin; stopped glymepiride in June due to low BS; study med at [facility] is placebo). She is compliant with treatment all of the time. Her weight is stable. She monitors blood glucose at home 1-2 x per day. Her breakfast blood glucose is taken between 7-8 am. Her breakfast blood glucose range is generally 90-110 mg/dl.  She was started on glimepiride in March but BS were too low - 30-50 range.  Now glucoses are much higher.  Last A1C 9.2.  Skin cancer - pt has hx of facial melanoma and basal cell skin cancer.  She has not had Dermatology follow for over a year.  She denies any new worrisome lesions.  Review of Systems  Constitutional: Negative for appetite change, fatigue, fever and unexpected weight change.  HENT: Negative for tinnitus and trouble swallowing.   Eyes: Negative for visual disturbance.  Respiratory: Negative for cough, chest tightness and shortness of breath.   Cardiovascular: Negative for chest pain, palpitations and leg swelling.  Gastrointestinal: Negative for abdominal pain.  Endocrine: Negative for polydipsia and  polyuria.  Genitourinary: Negative for dysuria and hematuria.  Musculoskeletal: Negative for arthralgias.  Neurological: Negative for tremors, numbness and headaches.  Psychiatric/Behavioral: Negative for dysphoric mood.    Patient Active Problem List   Diagnosis Date Noted  . Atrial fibrillation ([place]) 12/25/2018  . OAB (overactive bladder) 08/11/2018  . Tinnitus aurium, bilateral 08/11/2018  . Renal cyst, right 05/01/2018  . 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  . Other tear of medial meniscus, current injury, left knee, initial encounter 12/31/2015  . Fracture of tibial plateau 12/31/2015  . DM (diabetes mellitus), type 2 with renal complications ([place]) [id number]  . Hyperlipidemia associated with type 2 diabetes mellitus ([place]) 08/14/2015  . Coronary artery disease involving native coronary artery of native heart without angina pectoris 08/14/2015  . Basal cell carcinoma 08/11/2015  . History of melanoma 08/11/2015  . Major depression in partial remission ([place]) 08/08/2015  . Non-proliferative diabetic retinopathy, both eyes ([place]) 08/08/2015  . History of colon polyps 08/08/2015  . Hypothyroidism, postablative 08/08/2015  . Carotid artery plaque 09/03/2014  . Essential (primary) hypertension 09/03/2014  . APC (atrial premature contractions) 09/03/2014    Allergies  Allergen Reactions  . Propoxyphene Nausea And Vomiting    Other reaction(s): Vomiting  . Lidocaine-Epinephrine     Other reaction(s): Other (See Comments) palpitatins and chest pain  . Other Other (See Comments)    sutures - in her hand-very irritated  . Tape Rash    BANDAIDS. BANDAIDS  . Xylocaine  [Lidocaine Hcl]     Other reaction(s): Other (See Comments) palpitatins and chest pain    Past Surgical History:  Procedure Laterality Date  . ABDOMINAL HYSTERECTOMY    .  APPENDECTOMY    . BASAL CELL CARCINOMA EXCISION    . BREAST BIOPSY Right    needle bx-neg  . CATARACT EXTRACTION, BILATERAL Bilateral 11/23/2018   . CORONARY ARTERY BYPASS GRAFT  1996   x 3  . MELANOMA EXCISION    . PARTIAL HYSTERECTOMY  1976   ovaries remain    Social History   Tobacco Use  . Smoking status: Former Smoker    Quit date: 06/11/1982    Years since quitting: 37.1  . Smokeless tobacco: Never Used  . Tobacco comment: smoking cessation materials not required  Substance Use Topics  . Alcohol use: Yes    Alcohol/week: 0.0 standard drinks    Comment: rare 1 wine glass of wine  . Drug use: No     Medication list has been reviewed and updated.  Current Meds  Medication Sig  . amLODipine (NORVASC) 2.5 MG tablet TAKE 1 TABLET EVERY DAY  . aspirin 81 MG tablet Take by mouth.  Marland Kitchen atorvastatin (LIPITOR) 20 MG tablet TAKE 1 TABLET (20 MG TOTAL) BY MOUTH DAILY.  . BD PEN NEEDLE NANO U/F 32G X 4 MM MISC USE 1  TWICE DAILY  . enalapril (VASOTEC) 5 MG tablet TAKE 1 TABLET TWICE DAILY  . FLUoxetine (PROZAC) 40 MG capsule TAKE 1 CAPSULE EVERY DAY  . glucose blood (ONE TOUCH ULTRA TEST) test strip Frequency:PHARMDIR   Dosage:0.0     Instructions:  Note:testing 3-4xqd, DX Code 250.00 Dose: 1  . hydrochlorothiazide (HYDRODIURIL) 25 MG tablet TAKE 1 TABLET EVERY DAY  . Insulin Detemir (LEVEMIR FLEXTOUCH) 100 UNIT/ML Pen Inject 26 Units into the skin 2 (two) times daily. (Patient taking differently: Inject 24 Units into the skin 2 (two) times daily. )  . Investigational - Study Medication Study name: Semaglutide 3 mg or placebo tablets - 7 tablets  . levothyroxine (SYNTHROID) 50 MCG tablet TAKE 1 TABLET (50 MCG TOTAL) BY MOUTH DAILY BEFORE BREAKFAST.  . meloxicam (MOBIC) 15 MG tablet Take 1 tablet (15 mg total) by mouth daily.  . metFORMIN (GLUCOPHAGE-XR) 500 MG 24 hr tablet TAKE 2 TABLETS TWICE DAILY    PHQ 2/9 Scores 07/25/2019 12/25/2018 12/13/2018 09/20/2018  PHQ - 2 Score 6 2 1 3   PHQ- 9 Score 18 5 - 9    BP Readings from Last 3 Encounters:  07/25/19 132/78  12/25/18 122/60  12/13/18 124/68    Physical Exam Vitals  signs and nursing note reviewed.  Constitutional:      General: She is not in acute distress.    Appearance: She is well-developed.  HENT:     Head: Normocephalic and atraumatic.  Cardiovascular:     Rate and Rhythm: Normal rate and regular rhythm.     Pulses: Normal pulses.     Heart sounds: No murmur.  Pulmonary:     Effort: Pulmonary effort is normal. No respiratory distress.  Musculoskeletal: Normal range of motion.     Right lower leg: No edema.     Left lower leg: No edema.  Skin:    General: Skin is warm and dry.     Capillary Refill: Capillary refill takes less than 2 seconds.     Findings: No rash.  Neurological:     General: No focal deficit present.     Mental Status: She is alert and oriented to person, place, and time.  Psychiatric:        Behavior: Behavior normal.        Thought Content: Thought content normal.  Wt Readings from Last 3 Encounters:  07/25/19 180 lb (81.6 kg)  12/25/18 180 lb 3.2 oz (81.7 kg)  12/13/18 182 lb (82.6 kg)    BP 132/78   Pulse 85   Ht 5\' 6"  (1.676 m)   Wt 180 lb (81.6 kg)   SpO2 97%   BMI 29.05 kg/m   Assessment and Plan: 1. Type 2 diabetes mellitus with microalbuminuria, with long-term current use of insulin (HCC) Uncontrolled DM on levemire 24 units bid and metformin 1000 mg bid She is in a study but receiving the placebo A1C yesterday very high Glucoses bottomed out on low dose amaryl Add jardiance 10 mg once a day - sample given - Basic metabolic panel  2. Essential (primary) hypertension Clinically stable exam with well controlled BP.   Tolerating medications, amlodipine 2.5 mg, enalapril 5 mg and HCTZ 25 mg, without side effects at this time. Pt to continue current regimen and low sodium diet; benefits of regular exercise as able discussed.  3. Melanoma of face ([place]) Pt due for Dermatology follow up at the office of her choice  4. Need for immunization against influenza - Flu Vaccine QUAD High Dose(Fluad)    Partially dictated using Editor, commissioning. Any errors are unintentional.  Halina Maidens, MD [facility]  07/25/2019

## 2019-07-25 NOTE — Patient Instructions (Signed)
Check on cost of insulin - Tyler Aas Toujeo Basaglar Lantus Levemir  Check on cost of [place]

## 2019-07-27 LAB — BASIC METABOLIC PANEL
BUN/Creatinine Ratio: 17 (ref 12–28)
BUN: 20 mg/dL (ref 8–27)
CO2: 21 mmol/L (ref 20–29)
Calcium: 9.5 mg/dL (ref 8.7–10.3)
Chloride: 98 mmol/L (ref 96–106)
Creatinine, Ser: 1.18 mg/dL — ABNORMAL HIGH (ref 0.57–1.00)
GFR calc Af Amer: 52 mL/min/{1.73_m2} — ABNORMAL LOW (ref >59)
GFR calc non Af Amer: 45 mL/min/{1.73_m2} — ABNORMAL LOW (ref >59)
Glucose: 176 mg/dL — ABNORMAL HIGH (ref 65–99)
Potassium: 4.7 mmol/L (ref 3.5–5.2)
Sodium: 137 mmol/L (ref 134–144)

## 2019-07-28 ENCOUNTER — Other Ambulatory Visit: Payer: Self-pay | Admitting: Internal Medicine

## 2019-07-31 DIAGNOSIS — E1169 Type 2 diabetes mellitus with other specified complication: Secondary | ICD-10-CM | POA: Diagnosis not present

## 2019-07-31 DIAGNOSIS — E785 Hyperlipidemia, unspecified: Secondary | ICD-10-CM | POA: Diagnosis not present

## 2019-07-31 DIAGNOSIS — I10 Essential (primary) hypertension: Secondary | ICD-10-CM | POA: Diagnosis not present

## 2019-07-31 DIAGNOSIS — I25810 Atherosclerosis of coronary artery bypass graft(s) without angina pectoris: Secondary | ICD-10-CM | POA: Diagnosis not present

## 2019-07-31 DIAGNOSIS — I491 Atrial premature depolarization: Secondary | ICD-10-CM | POA: Diagnosis not present

## 2019-07-31 DIAGNOSIS — I6523 Occlusion and stenosis of bilateral carotid arteries: Secondary | ICD-10-CM | POA: Diagnosis not present

## 2019-08-07 DIAGNOSIS — E785 Hyperlipidemia, unspecified: Secondary | ICD-10-CM | POA: Diagnosis not present

## 2019-08-07 DIAGNOSIS — E1169 Type 2 diabetes mellitus with other specified complication: Secondary | ICD-10-CM | POA: Diagnosis not present

## 2019-08-24 ENCOUNTER — Telehealth: Payer: Self-pay

## 2019-08-24 NOTE — Telephone Encounter (Signed)
Patient insurance changing to [place] [employer] plan starting January 1st. She is about to run out of [place] and [employer].   She would like 30 days of [place] sent to [facility] [place].   Please Advise. Wants to try [place] before January to see how it works for her.   Benedict Needy, CMA

## 2019-08-26 ENCOUNTER — Other Ambulatory Visit: Payer: Self-pay | Admitting: Internal Medicine

## 2019-08-26 DIAGNOSIS — R809 Proteinuria, unspecified: Secondary | ICD-10-CM

## 2019-08-26 DIAGNOSIS — E1129 Type 2 diabetes mellitus with other diabetic kidney complication: Secondary | ICD-10-CM

## 2019-08-26 MED ORDER — JARDIANCE 10 MG PO TABS: 10.0000 mg | ORAL_TABLET | Freq: Every day | ORAL | 0 refills | Status: DC

## 2019-08-26 NOTE — Telephone Encounter (Signed)
Angela Rose is long acting insulin and will work exactly the same as Levemir so I don't understand what she is trying to accomplish.  I will send the Jardiance.

## 2019-08-27 ENCOUNTER — Other Ambulatory Visit: Payer: Self-pay

## 2019-08-27 DIAGNOSIS — E113299 Type 2 diabetes mellitus with mild nonproliferative diabetic retinopathy without macular edema, unspecified eye: Secondary | ICD-10-CM

## 2019-08-27 MED ORDER — LEVEMIR FLEXTOUCH 100 UNIT/ML ~~LOC~~ SOPN: 24.0000 [IU] | PEN_INJECTOR | Freq: Two times a day (BID) | SUBCUTANEOUS | 0 refills | Status: DC

## 2019-08-27 NOTE — Telephone Encounter (Signed)
Patient informed. Sent Levimir to [facility] in [place].

## 2019-09-04 ENCOUNTER — Encounter: Payer: Self-pay | Admitting: Internal Medicine

## 2019-09-04 ENCOUNTER — Ambulatory Visit (INDEPENDENT_AMBULATORY_CARE_PROVIDER_SITE_OTHER): Payer: Medicare HMO | Admitting: Internal Medicine

## 2019-09-04 ENCOUNTER — Other Ambulatory Visit: Payer: Self-pay

## 2019-09-04 VITALS — BP 124/82 | HR 88 | Ht 66.0 in | Wt 177.0 lb

## 2019-09-04 DIAGNOSIS — B3749 Other urogenital candidiasis: Secondary | ICD-10-CM

## 2019-09-04 DIAGNOSIS — N3001 Acute cystitis with hematuria: Secondary | ICD-10-CM

## 2019-09-04 LAB — POC URINALYSIS WITH MICROSCOPIC (NON AUTO)MANUAL RESULT
Bilirubin, UA: NEGATIVE
Crystals: 0
Epithelial cells, urine per micros: 0
Glucose, UA: POSITIVE — AB
Ketones, UA: NEGATIVE
Mucus, UA: 0
Nitrite, UA: NEGATIVE
Protein, UA: POSITIVE — AB
RBC: 0 M/uL — AB (ref 4.04–5.48)
Spec Grav, UA: 1.02 (ref 1.010–1.025)
Urobilinogen, UA: 0.2 E.U./dL
pH, UA: 5 (ref 5.0–8.0)

## 2019-09-04 MED ORDER — FLUCONAZOLE 100 MG PO TABS: 100.0000 mg | ORAL_TABLET | Freq: Every day | ORAL | 0 refills | Status: AC

## 2019-09-04 MED ORDER — SULFAMETHOXAZOLE-TRIMETHOPRIM 800-160 MG PO TABS: 1.0000 | ORAL_TABLET | Freq: Two times a day (BID) | ORAL | 0 refills | Status: AC

## 2019-09-04 NOTE — Progress Notes (Signed)
Date:  09/04/2019   Name:  Angela Rose   DOB:  1946-01-03   MRN:  [id number]   Chief Complaint: Urinary Tract Infection (Pt having burning and urgency. X 2 weeks- persistant.)  Urinary Tract Infection  This is a new problem. The current episode started in the past 7 days. The problem has been gradually worsening. The quality of the pain is described as burning. The pain is mild. There has been no fever. Associated symptoms include frequency and urgency. Pertinent negatives include no chills, discharge, flank pain, hematuria, nausea or vomiting. She has tried nothing for the symptoms.    Lab Results  Component Value Date   CREATININE 1.18 (H) 07/25/2019   BUN 20 07/25/2019   NA 137 07/25/2019   K 4.7 07/25/2019   CL 98 07/25/2019   CO2 21 07/25/2019   Lab Results  Component Value Date   CHOL 223 (H) 12/25/2018   HDL 50 12/25/2018   LDLCALC 129 (H) 12/25/2018   TRIG 222 (H) 12/25/2018   CHOLHDL 4.5 (H) 12/25/2018   Lab Results  Component Value Date   TSH 4.690 (H) 12/25/2018   Lab Results  Component Value Date   HGBA1C 9.2 07/24/2019    Review of Systems  Constitutional: Negative for chills, fatigue and fever.  Respiratory: Negative for chest tightness and shortness of breath.   Cardiovascular: Negative for chest pain.  Gastrointestinal: Negative for nausea and vomiting.  Genitourinary: Positive for frequency and urgency. Negative for flank pain and hematuria.  Neurological: Negative for dizziness, light-headedness and headaches.  Psychiatric/Behavioral: Negative for sleep disturbance.    Patient Active Problem List   Diagnosis Date Noted  . Melanoma of face ([place]) 07/25/2019  . Atrial fibrillation ([place]) 12/25/2018  . OAB (overactive bladder) 08/11/2018  . Tinnitus aurium, bilateral 08/11/2018  . Renal cyst, right 05/01/2018  . 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  . Other tear of medial meniscus, current injury, left knee, initial encounter 12/31/2015  .  Fracture of tibial plateau 12/31/2015  . DM (diabetes mellitus), type 2 with renal complications ([place]) [id number]  . Hyperlipidemia associated with type 2 diabetes mellitus ([place]) 08/14/2015  . Coronary artery disease involving native coronary artery of native heart without angina pectoris 08/14/2015  . Basal cell carcinoma 08/11/2015  . History of melanoma 08/11/2015  . Major depression in partial remission ([place]) 08/08/2015  . Non-proliferative diabetic retinopathy, both eyes ([place]) 08/08/2015  . History of colon polyps 08/08/2015  . Hypothyroidism, postablative 08/08/2015  . Carotid artery plaque 09/03/2014  . Essential (primary) hypertension 09/03/2014  . APC (atrial premature contractions) 09/03/2014    Allergies  Allergen Reactions  . Propoxyphene Nausea And Vomiting    Other reaction(s): Vomiting  . Lidocaine-Epinephrine     Other reaction(s): Other (See Comments) palpitatins and chest pain  . Other Other (See Comments)    sutures - in her hand-very irritated  . Tape Rash    BANDAIDS. BANDAIDS  . Xylocaine  [Lidocaine Hcl]     Other reaction(s): Other (See Comments) palpitatins and chest pain    Past Surgical History:  Procedure Laterality Date  . ABDOMINAL HYSTERECTOMY    . APPENDECTOMY    . BASAL CELL CARCINOMA EXCISION    . BREAST BIOPSY Right    needle bx-neg  . CATARACT EXTRACTION, BILATERAL Bilateral 11/23/2018  . CORONARY ARTERY BYPASS GRAFT  1996   x 3  . MELANOMA EXCISION    . PARTIAL HYSTERECTOMY  1976   ovaries remain  Social History   Tobacco Use  . Smoking status: Former Smoker    Quit date: 06/11/1982    Years since quitting: 37.2  . Smokeless tobacco: Never Used  . Tobacco comment: smoking cessation materials not required  Substance Use Topics  . Alcohol use: Yes    Alcohol/week: 0.0 standard drinks    Comment: rare 1 wine glass of wine  . Drug use: No     Medication list has been reviewed and updated.  Current Meds  Medication Sig   . amLODipine (NORVASC) 2.5 MG tablet TAKE 1 TABLET EVERY DAY  . aspirin 81 MG tablet Take by mouth.  Marland Kitchen atorvastatin (LIPITOR) 20 MG tablet TAKE 1 TABLET (20 MG TOTAL) BY MOUTH DAILY.  . BD PEN NEEDLE NANO U/F 32G X 4 MM MISC USE 1 EACH TWICE DAILY  . empagliflozin (JARDIANCE) 10 MG TABS tablet Take 10 mg by mouth daily before breakfast.  . enalapril (VASOTEC) 5 MG tablet TAKE 1 TABLET TWICE DAILY  . FLUoxetine (PROZAC) 40 MG capsule TAKE 1 CAPSULE EVERY DAY  . glucose blood (ONE TOUCH ULTRA TEST) test strip Frequency:PHARMDIR   Dosage:0.0     Instructions:  Note:testing 3-4xqd, DX Code 250.00 Dose: 1  . hydrochlorothiazide (HYDRODIURIL) 25 MG tablet TAKE 1 TABLET EVERY DAY  . Insulin Detemir (LEVEMIR FLEXTOUCH) 100 UNIT/ML Pen Inject 24 Units into the skin 2 (two) times daily.  . Investigational - Study Medication Study name: Semaglutide 3 mg or placebo tablets - 7 tablets  . levothyroxine (SYNTHROID) 50 MCG tablet TAKE 1 TABLET (50 MCG TOTAL) BY MOUTH DAILY BEFORE BREAKFAST.  . meloxicam (MOBIC) 15 MG tablet Take 1 tablet (15 mg total) by mouth daily.  . metFORMIN (GLUCOPHAGE-XR) 500 MG 24 hr tablet TAKE 2 TABLETS TWICE DAILY    PHQ 2/9 Scores 09/04/2019 07/25/2019 12/25/2018 12/13/2018  PHQ - 2 Score 6 6 2 1   PHQ- 9 Score 7 18 5  -    BP Readings from Last 3 Encounters:  09/04/19 124/82  07/25/19 132/78  12/25/18 122/60    Physical Exam Vitals signs and nursing note reviewed.  Constitutional:      Appearance: Normal appearance. She is well-developed.  Cardiovascular:     Rate and Rhythm: Normal rate and regular rhythm.     Heart sounds: Normal heart sounds.  Pulmonary:     Effort: Pulmonary effort is normal. No respiratory distress.     Breath sounds: Normal breath sounds.  Abdominal:     General: Bowel sounds are normal.     Palpations: Abdomen is soft.     Tenderness: There is abdominal tenderness in the suprapubic area. There is no guarding or rebound.  Musculoskeletal:      Right lower leg: No edema.     Left lower leg: No edema.     Wt Readings from Last 3 Encounters:  09/04/19 177 lb (80.3 kg)  07/25/19 180 lb (81.6 kg)  12/25/18 180 lb 3.2 oz (81.7 kg)    BP 124/82   Pulse 88   Ht 5\' 6"  (1.676 m)   Wt 177 lb (80.3 kg)   SpO2 95%   BMI 28.57 kg/m   Assessment and Plan: 1. Acute cystitis with hematuria Continue to increase fluids, continue cranberry juice - POC urinalysis w microscopic (non auto) - sulfamethoxazole-trimethoprim (BACTRIM DS) 800-160 MG tablet; Take 1 tablet by mouth 2 (two) times daily for 10 days.  Dispense: 20 tablet; Refill: 0  2. Yeast UTI Likely due to [facility] therapy  Continue adequate fluids daily - fluconazole (DIFLUCAN) 100 MG tablet; Take 1 tablet (100 mg total) by mouth daily for 3 days.  Dispense: 3 tablet; Refill: 0   Partially dictated using Editor, commissioning. Any errors are unintentional.  Halina Maidens, MD [facility]  09/04/2019

## 2019-09-18 ENCOUNTER — Encounter: Payer: Self-pay | Admitting: Internal Medicine

## 2019-09-18 ENCOUNTER — Ambulatory Visit (INDEPENDENT_AMBULATORY_CARE_PROVIDER_SITE_OTHER): Payer: Medicare HMO | Admitting: Internal Medicine

## 2019-09-18 ENCOUNTER — Other Ambulatory Visit: Payer: Self-pay

## 2019-09-18 ENCOUNTER — Other Ambulatory Visit: Payer: Self-pay | Admitting: Internal Medicine

## 2019-09-18 VITALS — BP 118/78 | HR 88 | Ht 66.0 in | Wt 174.0 lb

## 2019-09-18 DIAGNOSIS — M62838 Other muscle spasm: Secondary | ICD-10-CM | POA: Diagnosis not present

## 2019-09-18 DIAGNOSIS — E113299 Type 2 diabetes mellitus with mild nonproliferative diabetic retinopathy without macular edema, unspecified eye: Secondary | ICD-10-CM

## 2019-09-18 DIAGNOSIS — N3000 Acute cystitis without hematuria: Secondary | ICD-10-CM | POA: Diagnosis not present

## 2019-09-18 LAB — POCT URINALYSIS DIPSTICK
Bilirubin, UA: NEGATIVE
Glucose, UA: POSITIVE — AB
Ketones, UA: NEGATIVE
Nitrite, UA: NEGATIVE
Protein, UA: POSITIVE — AB
Spec Grav, UA: 1.01 (ref 1.010–1.025)
Urobilinogen, UA: 0.2 E.U./dL
pH, UA: 5 (ref 5.0–8.0)

## 2019-09-18 MED ORDER — LEVEMIR FLEXTOUCH 100 UNIT/ML ~~LOC~~ SOPN: 25.0000 [IU] | PEN_INJECTOR | Freq: Two times a day (BID) | SUBCUTANEOUS | 3 refills | Status: DC

## 2019-09-18 NOTE — Progress Notes (Signed)
Date:  09/18/2019   Name:  Angela Rose   DOB:  [birth year]   MRN:  [id number]   Chief Complaint: Neck Pain (Base of neck pain for several days.) and Urinary Tract Infection (Recurrent. Given abx at last visit and symptoms have not gotten any better.)  Neck Pain  This is a new problem. The current episode started in the past 7 days. The problem occurs daily. The problem has been unchanged. Pertinent negatives include no chest pain, fever or headaches.  Urinary Tract Infection  This is a new problem. The current episode started 1 to 4 weeks ago. The problem occurs every urination. The problem has been unchanged. Associated symptoms include frequency and urgency. Pertinent negatives include no chills. She has tried antibiotics for the symptoms. The treatment provided no relief.  Treated with Bactrim and fluconazole.  Lab Results  Component Value Date   CREATININE 1.18 (H) 07/25/2019   BUN 20 07/25/2019   NA 137 07/25/2019   K 4.7 07/25/2019   CL 98 07/25/2019   CO2 21 07/25/2019   Lab Results  Component Value Date   CHOL 223 (H) 12/25/2018   HDL 50 12/25/2018   LDLCALC 129 (H) 12/25/2018   TRIG 222 (H) 12/25/2018   CHOLHDL 4.5 (H) 12/25/2018   Lab Results  Component Value Date   TSH 4.690 (H) 12/25/2018   Lab Results  Component Value Date   HGBA1C 9.2 07/24/2019     Review of Systems  Constitutional: Negative for chills, fatigue and fever.  Respiratory: Negative for chest tightness and shortness of breath.   Cardiovascular: Negative for chest pain.  Genitourinary: Positive for dysuria, frequency and urgency.  Musculoskeletal: Positive for myalgias and neck pain.  Neurological: Negative for dizziness and headaches.    Patient Active Problem List   Diagnosis Date Noted  . Melanoma of face ([place]) 07/25/2019  . Atrial fibrillation ([place]) 12/25/2018  . OAB (overactive bladder) 08/11/2018  . Tinnitus aurium, bilateral 08/11/2018  . Renal cyst, right 05/01/2018   . 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  . Other tear of medial meniscus, current injury, left knee, initial encounter 12/31/2015  . Fracture of tibial plateau 12/31/2015  . DM (diabetes mellitus), type 2 with renal complications ([place]) [id number]  . Hyperlipidemia associated with type 2 diabetes mellitus ([place]) 08/14/2015  . Coronary artery disease involving native coronary artery of native heart without angina pectoris 08/14/2015  . Basal cell carcinoma 08/11/2015  . History of melanoma 08/11/2015  . Major depression in partial remission ([place]) 08/08/2015  . Non-proliferative diabetic retinopathy, both eyes ([place]) 08/08/2015  . History of colon polyps 08/08/2015  . Hypothyroidism, postablative 08/08/2015  . Carotid artery plaque 09/03/2014  . Essential (primary) hypertension 09/03/2014  . APC (atrial premature contractions) 09/03/2014    Allergies  Allergen Reactions  . Propoxyphene Nausea And Vomiting    Other reaction(s): Vomiting  . Lidocaine-Epinephrine     Other reaction(s): Other (See Comments) palpitatins and chest pain  . Other Other (See Comments)    sutures - in her hand-very irritated  . Tape Rash    BANDAIDS. BANDAIDS  . Xylocaine  [Lidocaine Hcl]     Other reaction(s): Other (See Comments) palpitatins and chest pain    Past Surgical History:  Procedure Laterality Date  . ABDOMINAL HYSTERECTOMY    . APPENDECTOMY    . BASAL CELL CARCINOMA EXCISION    . BREAST BIOPSY Right    needle bx-neg  . CATARACT EXTRACTION, BILATERAL Bilateral 11/23/2018  .  CORONARY ARTERY BYPASS GRAFT  1996   x 3  . MELANOMA EXCISION    . PARTIAL HYSTERECTOMY  1976   ovaries remain    Social History   Tobacco Use  . Smoking status: Former Smoker    Quit date: 06/11/1982    Years since quitting: 37.2  . Smokeless tobacco: Never Used  . Tobacco comment: smoking cessation materials not required  Substance Use Topics  . Alcohol use: Yes    Alcohol/week: 0.0 standard drinks    Comment: rare  1 wine glass of wine  . Drug use: No     Medication list has been reviewed and updated.  Current Meds  Medication Sig  . amLODipine (NORVASC) 2.5 MG tablet TAKE 1 TABLET EVERY DAY  . aspirin 81 MG tablet Take by mouth.  Marland Kitchen atorvastatin (LIPITOR) 20 MG tablet TAKE 1 TABLET (20 MG TOTAL) BY MOUTH DAILY.  . BD PEN NEEDLE NANO U/F 32G X 4 MM MISC USE 1  TWICE DAILY  . empagliflozin (JARDIANCE) 10 MG TABS tablet Take 10 mg by mouth daily before breakfast.  . enalapril (VASOTEC) 5 MG tablet TAKE 1 TABLET TWICE DAILY  . FLUoxetine (PROZAC) 40 MG capsule TAKE 1 CAPSULE EVERY DAY  . glucose blood (ONE TOUCH ULTRA TEST) test strip Frequency:PHARMDIR   Dosage:0.0     Instructions:  Note:testing 3-4xqd, DX Code 250.00 Dose: 1  . hydrochlorothiazide (HYDRODIURIL) 25 MG tablet TAKE 1 TABLET EVERY DAY  . Insulin Detemir (LEVEMIR FLEXTOUCH) 100 UNIT/ML Pen Inject 24 Units into the skin 2 (two) times daily.  . Investigational - Study Medication Study name: Semaglutide 3 mg or placebo tablets - 7 tablets  . levothyroxine (SYNTHROID) 50 MCG tablet TAKE 1 TABLET (50 MCG TOTAL) BY MOUTH DAILY BEFORE BREAKFAST.  . meloxicam (MOBIC) 15 MG tablet Take 1 tablet (15 mg total) by mouth daily.  . metFORMIN (GLUCOPHAGE-XR) 500 MG 24 hr tablet TAKE 2 TABLETS TWICE DAILY    PHQ 2/9 Scores 09/04/2019 07/25/2019 12/25/2018 12/13/2018  PHQ - 2 Score 6 6 2 1   PHQ- 9 Score 7 18 5  -    BP Readings from Last 3 Encounters:  09/18/19 118/78  09/04/19 124/82  07/25/19 132/78    Physical Exam Vitals signs and nursing note reviewed.  Constitutional:      General: She is not in acute distress.    Appearance: She is well-developed.  HENT:     Head: Normocephalic and atraumatic.  Neck:     Musculoskeletal: Normal range of motion.  Cardiovascular:     Rate and Rhythm: Normal rate and regular rhythm.     Heart sounds: Normal heart sounds.  Pulmonary:     Effort: Pulmonary effort is normal. No respiratory distress.      Breath sounds: Normal breath sounds.  Abdominal:     General: Bowel sounds are normal.     Palpations: Abdomen is soft.     Tenderness: There is no abdominal tenderness. There is no right CVA tenderness, left CVA tenderness, guarding or rebound.  Musculoskeletal:     Cervical back: She exhibits decreased range of motion and tenderness (on right side).     Right lower leg: No edema.     Left lower leg: No edema.  Skin:    General: Skin is warm and dry.     Capillary Refill: Capillary refill takes less than 2 seconds.     Findings: No rash.  Neurological:     Mental Status: She is alert  and oriented to person, place, and time.     Sensory: Sensation is intact.     Motor: Motor function is intact.  Psychiatric:        Behavior: Behavior normal.        Thought Content: Thought content normal.     Wt Readings from Last 3 Encounters:  09/18/19 174 lb (78.9 kg)  09/04/19 177 lb (80.3 kg)  07/25/19 180 lb (81.6 kg)    BP 118/78   Pulse 88   Ht 5\' 6"  (1.676 m)   Wt 174 lb (78.9 kg)   SpO2 99%   BMI 28.08 kg/m   Assessment and Plan: 1. Acute cystitis without hematuria Will wait for urine culture before additional antibiotics - Urine Culture - POCT urinalysis dipstick  2. Muscle spasms of neck Recommend tizanidine and Advil every 8 hours with heat or ice to neck and shoulder   Partially dictated using Editor, commissioning. Any errors are unintentional.  Halina Maidens, MD [facility]  09/18/2019

## 2019-09-18 NOTE — Patient Instructions (Signed)
Take one Advil 800 mg with one Tizanadine - every 8 hours for the next few days  Use heat or Ice on neck and shoulder

## 2019-09-21 ENCOUNTER — Telehealth: Payer: Self-pay | Admitting: Internal Medicine

## 2019-09-21 ENCOUNTER — Other Ambulatory Visit: Payer: Self-pay

## 2019-09-21 DIAGNOSIS — E1129 Type 2 diabetes mellitus with other diabetic kidney complication: Secondary | ICD-10-CM

## 2019-09-21 LAB — URINE CULTURE

## 2019-09-21 MED ORDER — NITROFURANTOIN MONOHYD MACRO 100 MG PO CAPS: 100.0000 mg | ORAL_CAPSULE | Freq: Two times a day (BID) | ORAL | 0 refills | Status: DC

## 2019-09-21 MED ORDER — JARDIANCE 10 MG PO TABS: 10.0000 mg | ORAL_TABLET | Freq: Every day | ORAL | 0 refills | Status: DC

## 2019-09-21 NOTE — Telephone Encounter (Signed)
Called to re-schedule Medicare Annual Wellness Visit with Nurse Health Advisor, Clemetine Marker at [facility]. If patient returns call, please schedule AWV with [facility] ~ Any date on [facility] schedule (Mon or Wed)  Questions regarding scheduling, please call  [phone] or MS Teams > [email or web address]   [place] . [email or web address]  [phone]

## 2019-09-24 ENCOUNTER — Ambulatory Visit: Payer: Medicare HMO

## 2019-09-24 NOTE — Telephone Encounter (Signed)
Rescheduled 10/15/19

## 2019-09-25 NOTE — Progress Notes (Signed)
Patient husband informed. Antibiotics are not helping and she is having a terrible time with pain and irritation. Pt is NO BETTER.  Please advise?

## 2019-10-01 ENCOUNTER — Telehealth: Payer: Self-pay | Admitting: Internal Medicine

## 2019-10-01 NOTE — Telephone Encounter (Signed)
She may have to be seen if her symptoms worsen.  She is at higher risk so she should not wait at home for a result that may take 5 days if she gets worse.

## 2019-10-01 NOTE — Telephone Encounter (Signed)
Spoke to pt please put referral in for Urology

## 2019-10-01 NOTE — Telephone Encounter (Signed)
Patient complain of UTI for three weeks with antibiotics, patient said that the antibiotics did not resolve her symptoms. She had a temp of 100.9 last night daughter is taking her to get a covid test done, when she takes a deep breath it feels uncomfortable and makes her cough, she's not in respiratory distress

## 2019-10-02 ENCOUNTER — Other Ambulatory Visit: Payer: Self-pay | Admitting: Internal Medicine

## 2019-10-02 DIAGNOSIS — R300 Dysuria: Secondary | ICD-10-CM

## 2019-10-15 ENCOUNTER — Other Ambulatory Visit: Payer: Self-pay

## 2019-10-15 ENCOUNTER — Ambulatory Visit (INDEPENDENT_AMBULATORY_CARE_PROVIDER_SITE_OTHER): Payer: Medicare Other

## 2019-10-15 VITALS — BP 132/68 | HR 50 | Resp 16 | Ht 66.0 in | Wt 176.0 lb

## 2019-10-15 DIAGNOSIS — Z0000 Encounter for general adult medical examination without abnormal findings: Secondary | ICD-10-CM | POA: Diagnosis not present

## 2019-10-15 DIAGNOSIS — Z1231 Encounter for screening mammogram for malignant neoplasm of breast: Secondary | ICD-10-CM | POA: Diagnosis not present

## 2019-10-15 NOTE — Progress Notes (Signed)
Subjective:   Angela Rose is a 74 y.o. female who presents for Medicare Annual (Subsequent) preventive examination.  Review of Systems:   Cardiac Risk Factors include: advanced age (>64men, >34 women);diabetes mellitus;hypertension;dyslipidemia     Objective:     Vitals: BP 132/68 (BP Location: Left Arm, Patient Position: Sitting, Cuff Size: Normal)   Pulse (!) 50   Resp 16   Ht 5\' 6"  (1.676 m)   Wt 176 lb (79.8 kg)   SpO2 96%   BMI 28.41 kg/m   Body mass index is 28.41 kg/m.  Advanced Directives 10/15/2019 09/20/2018 06/20/2017  Does Patient Have a Medical Advance Directive? Yes No Yes  Type of Paramedic of [place];Living will - [place];Living will  Does patient want to make changes to medical advance directive? Yes (MAU/Ambulatory/Procedural Areas - Information given) - -  Copy of [place] in Chart? No - copy requested - No - copy requested  Would patient like information on creating a medical advance directive? - Yes (MAU/Ambulatory/Procedural Areas - Information given) -    Tobacco Social History   Tobacco Use  Smoking Status Former Smoker  . Quit date: 06/11/1982  . Years since quitting: 37.3  Smokeless Tobacco Never Used  Tobacco Comment   smoking cessation materials not required     Counseling given: Not Answered Comment: smoking cessation materials not required   Clinical Intake:  Pre-visit preparation completed: Yes  Pain : No/denies pain     Nutritional Status: BMI 25 -29 Overweight Nutritional Risks: None Diabetes: Yes CBG done?: No Did pt. bring in CBG monitor from home?: No   Nutrition Risk Assessment:  Has the patient had any N/V/D within the last 2 months?  No  Does the patient have any non-healing wounds?  No  Has the patient had any unintentional weight loss or weight gain?  No   Diabetes:  Is the patient diabetic?  Yes  If diabetic, was a CBG obtained today?   No  Did the patient bring in their glucometer from home?  No  How often do you monitor your CBG's? Once daily .   Financial Strains and Diabetes Management:  Are you having any financial strains with the device, your supplies or your medication? No .  Does the patient want to be seen by Chronic Care Management for management of their diabetes?  No  Would the patient like to be referred to a Nutritionist or for Diabetic Management?  No   Diabetic Exams:  Diabetic Eye Exam: Completed 07/24/19 negative retinopathy.   Diabetic Foot Exam: Completed 12/25/18.  How often do you need to have someone help you when you read instructions, pamphlets, or other written materials from your doctor or pharmacy?: 1 - Never  Interpreter Needed?: No  Information entered by :: Clemetine Marker LPN  Past Medical History:  Diagnosis Date  . Anxiety   . Cancer ([place])    melanoma face  . Depression   . Diabetes mellitus without complication ([place])   . Hyperlipidemia   . Hypertension    Past Surgical History:  Procedure Laterality Date  . ABDOMINAL HYSTERECTOMY    . APPENDECTOMY    . BASAL CELL CARCINOMA EXCISION    . BREAST BIOPSY Right    needle bx-neg  . CATARACT EXTRACTION, BILATERAL Bilateral 11/23/2018  . CORONARY ARTERY BYPASS GRAFT  1996   x 3  . MELANOMA EXCISION    . PARTIAL HYSTERECTOMY  1976  ovaries remain   Family History  Problem Relation Age of Onset  . Skin cancer Mother   . Squamous cell carcinoma Mother   . Basal cell carcinoma Mother   . Heart disease Father   . CAD Father   . Breast cancer Neg Hx    Social History   Socioeconomic History  . Marital status: Significant Other    Spouse name: Not on file  . Number of children: 3  . Years of education: Not on file  . Highest education level: Some college, no degree  Occupational History  . Occupation: retired  Tobacco Use  . Smoking status: Former Smoker    Quit date: 06/11/1982    Years since quitting: 37.3  .  Smokeless tobacco: Never Used  . Tobacco comment: smoking cessation materials not required  Substance and Sexual Activity  . Alcohol use: Yes    Alcohol/week: 0.0 standard drinks    Comment: rare 1 wine glass of wine  . Drug use: No  . Sexual activity: Not on file  Other Topics Concern  . Not on file  Social History Narrative  . Not on file   Social Determinants of Health   Financial Resource Strain: High Risk  . Difficulty of Paying Living Expenses: Hard  Food Insecurity: No Food Insecurity  . Worried About Charity fundraiser in the Last Year: Never true  . Ran Out of Food in the Last Year: Never true  Transportation Needs: No Transportation Needs  . Lack of Transportation (Medical): No  . Lack of Transportation (Non-Medical): No  Physical Activity: Inactive  . Days of Exercise per Week: 0 days  . Minutes of Exercise per Session: 0 min  Stress: Stress Concern Present  . Feeling of Stress : Rather much  Social Connections: Somewhat Isolated  . Frequency of Communication with Friends and Family: More than three times a week  . Frequency of Social Gatherings with Friends and Family: Once a week  . Attends Religious Services: Never  . Active Member of Clubs or Organizations: No  . Attends Archivist Meetings: Never  . Marital Status: Living with partner    Outpatient Encounter Medications as of 10/15/2019  Medication Sig  . amLODipine (NORVASC) 2.5 MG tablet TAKE 1 TABLET EVERY DAY  . aspirin 81 MG tablet Take by mouth.  Marland Kitchen atorvastatin (LIPITOR) 20 MG tablet TAKE 1 TABLET (20 MG TOTAL) BY MOUTH DAILY.  . BD PEN NEEDLE NANO U/F 32G X 4 MM MISC USE 1  TWICE DAILY  . empagliflozin (JARDIANCE) 10 MG TABS tablet Take 10 mg by mouth daily before breakfast.  . enalapril (VASOTEC) 5 MG tablet TAKE 1 TABLET TWICE DAILY  . FLUoxetine (PROZAC) 40 MG capsule TAKE 1 CAPSULE EVERY DAY  . glucose blood (ONE TOUCH ULTRA TEST) test strip Frequency:PHARMDIR   Dosage:0.0      Instructions:  Note:testing 3-4xqd, DX Code 250.00 Dose: 1  . hydrochlorothiazide (HYDRODIURIL) 25 MG tablet TAKE 1 TABLET EVERY DAY  . Insulin Detemir (LEVEMIR FLEXTOUCH) 100 UNIT/ML Pen Inject 25 Units into the skin 2 (two) times daily.  . Investigational - Study Medication Study name: Semaglutide 3 mg or placebo tablets - 7 tablets  . levothyroxine (SYNTHROID) 50 MCG tablet TAKE 1 TABLET (50 MCG TOTAL) BY MOUTH DAILY BEFORE BREAKFAST.  . metFORMIN (GLUCOPHAGE-XR) 500 MG 24 hr tablet TAKE 2 TABLETS TWICE DAILY  . meloxicam (MOBIC) 15 MG tablet Take 1 tablet (15 mg total) by mouth daily. (Patient not  taking: Reported on 10/15/2019)  . [DISCONTINUED] nitrofurantoin, macrocrystal-monohydrate, (MACROBID) 100 MG capsule Take 1 capsule (100 mg total) by mouth 2 (two) times daily.   No facility-administered encounter medications on file as of 10/15/2019.    Activities of Daily Living In your present state of health, do you have any difficulty performing the following activities: 10/15/2019  Hearing? Y  Comment wears hearing aids  Vision? N  Difficulty concentrating or making decisions? N  Walking or climbing stairs? N  Dressing or bathing? N  Doing errands, shopping? N  Preparing Food and eating ? N  Using the Toilet? N  In the past six months, have you accidently leaked urine? Y  Comment wears depends  Do you have problems with loss of bowel control? N  Managing your Medications? N  Managing your Finances? N  Housekeeping or managing your Housekeeping? N  Some recent data might be hidden    Patient Care Team: Glean Hess, MD as PCP - General (Internal Medicine)    Assessment:   This is a routine wellness examination for Angela Rose.  Exercise Activities and Dietary recommendations Current Exercise Habits: The patient does not participate in regular exercise at present, Exercise limited by: Other - see comments(overactive bladder)  Goals    . DIET - INCREASE WATER INTAKE     Recommend  drinking 6-8 glasses of water per day     . Increase physical activity     Recommend increasing physical activity to at least 3 times per week       Fall Risk Fall Risk  10/15/2019 12/25/2018 12/13/2018 09/20/2018 06/23/2018  Falls in the past year? 0 1 1 0 No  Number falls in past yr: 0 0 0 0 -  Injury with Fall? 0 1 0 - -  Risk for fall due to : No Fall Risks - History of fall(s);Impaired balance/gait - -  Follow up Falls prevention discussed - Falls evaluation completed - -   FALL RISK PREVENTION PERTAINING TO THE HOME:  Any stairs in or around the home? Yes  If so, do they handrails? Yes   Home free of loose throw rugs in walkways, pet beds, electrical cords, etc? No - excess clutter and 4 dogs Adequate lighting in your home to reduce risk of falls? Yes   ASSISTIVE DEVICES UTILIZED TO PREVENT FALLS:  Life alert? No  Use of a cane, walker or w/c? No  Grab bars in the bathroom? No  Shower chair or bench in shower? Yes  Elevated toilet seat or a handicapped toilet? Yes   DME ORDERS:  DME order needed?  No   TIMED UP AND GO:  Was the test performed? Yes .  Length of time to ambulate 10 feet: 6 sec.   GAIT:  Appearance of gait: Gait stead-fast and without the use of an assistive device.   Education: Fall risk prevention has been discussed.  Intervention(s) required? No    Depression Screen PHQ 2/9 Scores 10/15/2019 09/04/2019 07/25/2019 12/25/2018  PHQ - 2 Score 6 6 6 2   PHQ- 9 Score 8 7 18 5      Cognitive Function     6CIT Screen 10/15/2019 09/20/2018 06/20/2017  What Year? 0 points 0 points 0 points  What month? 0 points 0 points 0 points  What time? 0 points 0 points 0 points  Count back from 20 0 points 0 points 0 points  Months in reverse 0 points 0 points 0 points  Repeat phrase 0 points 2  points 2 points  Total Score 0 2 2    Immunization History  Administered Date(s) Administered  . Fluad Quad(high Dose 65+) 07/25/2019  . Influenza, High Dose  Seasonal PF 06/20/2017, 06/23/2018  . Influenza,inj,Quad PF,6+ Mos 08/14/2015, 09/27/2016  . Pneumococcal Conjugate-13 02/16/2016  . Pneumococcal Polysaccharide-23 11/15/2008, 10/14/2014    Qualifies for Shingles Vaccine? No pt unsure if she ever had chicken pox, needs titer.   Tdap: Although this vaccine is not a covered service during a Wellness Exam, does the patient still wish to receive this vaccine today?  No .  Education has been provided regarding the importance of this vaccine. Advised may receive this vaccine at local pharmacy or Health Dept. Aware to provide a copy of the vaccination record if obtained from local pharmacy or Health Dept. Verbalized acceptance and understanding.  Flu Vaccine: Up to date  Pneumococcal Vaccine: Up to date   Screening Tests Health Maintenance  Topic Date Due  . MAMMOGRAM  11/14/2019  . FOOT EXAM  12/25/2019  . HEMOGLOBIN A1C  01/22/2020  . OPHTHALMOLOGY EXAM  07/23/2020  . COLONOSCOPY  09/10/2022  . TETANUS/TDAP  06/12/2023  . INFLUENZA VACCINE  Completed  . DEXA SCAN  Completed  . Hepatitis C Screening  Completed  . PNA vac Low Risk Adult  Completed    Cancer Screenings:  Colorectal Screening: Completed 09/10/12. Repeat every 10 years;   Mammogram: Completed 11/13/18. Repeat every year. Ordered today. Pt provided with contact information and advised to call to schedule appt.   Bone Density: Completed 11/13/18. Results reflect  OSTEOPENIA. Repeat every 2 years.  Lung Cancer Screening: (Low Dose CT Chest recommended if Age 39-80 years, 30 pack-year currently smoking OR have quit w/in 15years.) does not qualify.    Additional Screening:  Hepatitis C Screening: does qualify; Completed 08/21/18  Vision Screening: Recommended annual ophthalmology exams for early detection of glaucoma and other disorders of the eye. Is the patient up to date with their annual eye exam?  Yes  Who is the provider or what is the name of the office in which the  pt attends annual eye exams? [facility]  Dental Screening: Recommended annual dental exams for proper oral hygiene  Community Resource Referral:  CRR required this visit?  No      Plan:     I have personally reviewed and addressed the Medicare Annual Wellness questionnaire and have noted the following in the patient's chart:  A. Medical and social history B. Use of alcohol, tobacco or illicit drugs  C. Current medications and supplements D. Functional ability and status E.  Nutritional status F.  Physical activity G. Advance directives H. List of other physicians I.  Hospitalizations, surgeries, and ER visits in previous 12 months J.  [place] such as hearing and vision if needed, cognitive and depression L. Referrals and appointments   In addition, I have reviewed and discussed with patient certain preventive protocols, quality metrics, and best practice recommendations. A written personalized care plan for preventive services as well as general preventive health recommendations were provided to patient.   Signed,  Clemetine Marker, LPN Nurse Health Advisor   Nurse Notes: none

## 2019-10-15 NOTE — Patient Instructions (Signed)
Angela Rose , Thank you for taking time to come for your Medicare Wellness Visit. I appreciate your ongoing commitment to your health goals. Please review the following plan we discussed and let me know if I can assist you in the future.   Screening recommendations/referrals: Colonoscopy: done 09/10/12 Mammogram: done 11/13/18. Please call [phone] to schedule your mammogram.  Bone Density: done 11/13/18 Recommended yearly ophthalmology/optometry visit for glaucoma screening and checkup Recommended yearly dental visit for hygiene and checkup  Vaccinations: Influenza vaccine: done 07/25/19 Pneumococcal vaccine: done 02/16/16 Tdap vaccine: due - please contact us if you get a cut or scrape Shingles vaccine: Shingrix discussed. Please contact your pharmacy for coverage information.   Advanced directives: Please bring a copy of your health care power of attorney and living will to the office at your convenience.  Conditions/risks identified: Recommend increasing physical activity to at least 3 days per week  Next appointment: Please follow up in one year for your Medicare Annual Wellness visit.     Preventive Care 76 Years and Older, Female Preventive care refers to lifestyle choices and visits with your health care provider that can promote health and wellness. What does preventive care include?  A yearly physical exam. This is also called an annual well check.  Dental exams once or twice a year.  Routine eye exams. Ask your health care provider how often you should have your eyes checked.  Personal lifestyle choices, including:  Daily care of your teeth and gums.  Regular physical activity.  Eating a healthy diet.  Avoiding tobacco and drug use.  Limiting alcohol use.  Practicing safe sex.  Taking low-dose aspirin every day.  Taking vitamin and mineral supplements as recommended by your health care provider. What happens during an annual well check? The services and  screenings done by your health care provider during your annual well check will depend on your age, overall health, lifestyle risk factors, and family history of disease. Counseling  Your health care provider may ask you questions about your:  Alcohol use.  Tobacco use.  Drug use.  Emotional well-being.  Home and relationship well-being.  Sexual activity.  Eating habits.  History of falls.  Memory and ability to understand (cognition).  Work and work Statistician.  Reproductive health. Screening  You may have the following tests or measurements:  Height, weight, and BMI.  Blood pressure.  Lipid and cholesterol levels. These may be checked every 5 years, or more frequently if you are over 28 years old.  Skin check.  Lung cancer screening. You may have this screening every year starting at age 31 if you have a 30-pack-year history of smoking and currently smoke or have quit within the past 15 years.  Fecal occult blood test (FOBT) of the stool. You may have this test every year starting at age 7.  Flexible sigmoidoscopy or colonoscopy. You may have a sigmoidoscopy every 5 years or a colonoscopy every 10 years starting at age 30.  Hepatitis C blood test.  Hepatitis B blood test.  Sexually transmitted disease (STD) testing.  Diabetes screening. This is done by checking your blood sugar (glucose) after you have not eaten for a while (fasting). You may have this done every 1-3 years.  Bone density scan. This is done to screen for osteoporosis. You may have this done starting at age 85.  Mammogram. This may be done every 1-2 years. Talk to your health care provider about how often you should have regular mammograms. Talk with  your health care provider about your test results, treatment options, and if necessary, the need for more tests. Vaccines  Your health care provider may recommend certain vaccines, such as:  Influenza vaccine. This is recommended every  year.  Tetanus, diphtheria, and acellular pertussis (Tdap, Td) vaccine. You may need a Td booster every 10 years.  Zoster vaccine. You may need this after age 4.  Pneumococcal 13-valent conjugate (PCV13) vaccine. One dose is recommended after age 94.  Pneumococcal polysaccharide (PPSV23) vaccine. One dose is recommended after age 1. Talk to your health care provider about which screenings and vaccines you need and how often you need them. This information is not intended to replace advice given to you by your health care provider. Make sure you discuss any questions you have with your health care provider. Document Released: 10/24/2015 Document Revised: 06/16/2016 Document Reviewed: 07/29/2015 Elsevier Interactive Patient Education  2017 [place] Prevention in the Home Falls can cause injuries. They can happen to people of all ages. There are many things you can do to make your home safe and to help prevent falls. What can I do on the outside of my home?  Regularly fix the edges of walkways and driveways and fix any cracks.  Remove anything that might make you trip as you walk through a door, such as a raised step or threshold.  Trim any bushes or trees on the path to your home.  Use bright outdoor lighting.  Clear any walking paths of anything that might make someone trip, such as rocks or tools.  Regularly check to see if handrails are loose or broken. Make sure that both sides of any steps have handrails.  Any raised decks and porches should have guardrails on the edges.  Have any leaves, snow, or ice cleared regularly.  Use sand or salt on walking paths during winter.  Clean up any spills in your garage right away. This includes oil or grease spills. What can I do in the bathroom?  Use night lights.  Install grab bars by the toilet and in the tub and shower. Do not use towel bars as grab bars.  Use non-skid mats or decals in the tub or shower.  If you  need to sit down in the shower, use a plastic, non-slip stool.  Keep the floor dry. Clean up any water that spills on the floor as soon as it happens.  Remove soap buildup in the tub or shower regularly.  Attach bath mats securely with double-sided non-slip rug tape.  Do not have throw rugs and other things on the floor that can make you trip. What can I do in the bedroom?  Use night lights.  Make sure that you have a light by your bed that is easy to reach.  Do not use any sheets or blankets that are too big for your bed. They should not hang down onto the floor.  Have a firm chair that has side arms. You can use this for support while you get dressed.  Do not have throw rugs and other things on the floor that can make you trip. What can I do in the kitchen?  Clean up any spills right away.  Avoid walking on wet floors.  Keep items that you use a lot in easy-to-reach places.  If you need to reach something above you, use a strong step stool that has a grab bar.  Keep electrical cords out of the way.  Do not  use floor polish or wax that makes floors slippery. If you must use wax, use non-skid floor wax.  Do not have throw rugs and other things on the floor that can make you trip. What can I do with my stairs?  Do not leave any items on the stairs.  Make sure that there are handrails on both sides of the stairs and use them. Fix handrails that are broken or loose. Make sure that handrails are as long as the stairways.  Check any carpeting to make sure that it is firmly attached to the stairs. Fix any carpet that is loose or worn.  Avoid having throw rugs at the top or bottom of the stairs. If you do have throw rugs, attach them to the floor with carpet tape.  Make sure that you have a light switch at the top of the stairs and the bottom of the stairs. If you do not have them, ask someone to add them for you. What else can I do to help prevent falls?  Wear shoes  that:  Do not have high heels.  Have rubber bottoms.  Are comfortable and fit you well.  Are closed at the toe. Do not wear sandals.  If you use a stepladder:  Make sure that it is fully opened. Do not climb a closed stepladder.  Make sure that both sides of the stepladder are locked into place.  Ask someone to hold it for you, if possible.  Clearly mark and make sure that you can see:  Any grab bars or handrails.  First and last steps.  Where the edge of each step is.  Use tools that help you move around (mobility aids) if they are needed. These include:  Canes.  Walkers.  Scooters.  Crutches.  Turn on the lights when you go into a dark area. Replace any light bulbs as soon as they burn out.  Set up your furniture so you have a clear path. Avoid moving your furniture around.  If any of your floors are uneven, fix them.  If there are any pets around you, be aware of where they are.  Review your medicines with your doctor. Some medicines can make you feel dizzy. This can increase your chance of falling. Ask your doctor what other things that you can do to help prevent falls. This information is not intended to replace advice given to you by your health care provider. Make sure you discuss any questions you have with your health care provider. Document Released: 07/24/2009 Document Revised: 03/04/2016 Document Reviewed: 11/01/2014 Elsevier Interactive Patient Education  2017 [employer].

## 2019-10-30 ENCOUNTER — Other Ambulatory Visit: Payer: Self-pay

## 2019-10-30 DIAGNOSIS — R300 Dysuria: Secondary | ICD-10-CM

## 2019-11-01 NOTE — Progress Notes (Signed)
11/02/2019 9:06 AM   Angela Rose 10-20-1945 [id number]  Referring provider: Glean Hess, MD [address] [place],  [place] [address]  Chief Complaint  Patient presents with  . Dysuria    HPI: 74 yo F who presents today for further evaluation of dysuria and recurrent UTIs.  Angela Rose is a diabetic with poor control in the recent past (Aic 9-10) recently started on Jardiance in addition to metformin and insulin.  Since starting this medication, she has had a severe increase in urinary frequency and new urge incontinence.    She wears depends now and stays which is troublesome to her.  She has started to have skin integrity issues and new recurrent UTIs.    Infection symptoms include severe dysuria, foul smelling urine, and pelvic pain.  No fevers or flank pain.    UA 09/04/19 frankly positive but no urine culture sent.  UA also grossly positive on 09/18/19 but urine culture with mixed flora.  Each time symptoms improved with abx.    She notes that she is unable to provide urine in specimen cup, has to use nonsterile hat.  She was provided a hat today to give sample.    Today, she endorses external burning today both with voiding and after.  This is not severe today as in previous infections.  Post menopausal.  Not sexually active.    Avoids irritating beverages.   PVR 17 cc  No issues with constipation.  PMH: Past Medical History:  Diagnosis Date  . Anxiety   . Cancer ([facility])    melanoma face  . Depression   . Diabetes mellitus without complication ([place])   . Hyperlipidemia   . Hypertension     Surgical History: Past Surgical History:  Procedure Laterality Date  . ABDOMINAL HYSTERECTOMY    . APPENDECTOMY    . BASAL CELL CARCINOMA EXCISION    . BREAST BIOPSY Right    needle bx-neg  . CATARACT EXTRACTION, BILATERAL Bilateral 11/23/2018  . CORONARY ARTERY BYPASS GRAFT  1996   x 3  . MELANOMA EXCISION    . PARTIAL HYSTERECTOMY  1976     ovaries remain    Home Medications:  Allergies as of 11/02/2019      Reactions   Propoxyphene Nausea And Vomiting   Other reaction(s): Vomiting   Lidocaine-epinephrine    Other reaction(s): Other (See Comments) palpitatins and chest pain   Other Other (See Comments)   sutures - in her hand-very irritated   Tape Rash   BANDAIDS. BANDAIDS   Xylocaine  [lidocaine Hcl]    Other reaction(s): Other (See Comments) palpitatins and chest pain      Medication List       Accurate as of November 02, 2019 11:59 PM. If you have any questions, ask your nurse or doctor.        amLODipine 2.5 MG tablet Commonly known as: NORVASC TAKE 1 TABLET EVERY DAY   aspirin 81 MG tablet Take by mouth.   atorvastatin 20 MG tablet Commonly known as: LIPITOR TAKE 1 TABLET (20 MG TOTAL) BY MOUTH DAILY.   BD Pen Needle Nano U/F 32G X 4 MM Misc Generic drug: Insulin Pen Needle USE 1  TWICE DAILY   enalapril 5 MG tablet Commonly known as: VASOTEC TAKE 1 TABLET TWICE DAILY   estradiol 0.1 MG/GM vaginal cream Commonly known as: ESTRACE Place 1 g vaginally 3 (three) times a week. Started by: Hollice Espy, MD   FLUoxetine 40  MG capsule Commonly known as: PROZAC TAKE 1 CAPSULE EVERY DAY   hydrochlorothiazide 25 MG tablet Commonly known as: HYDRODIURIL TAKE 1 TABLET EVERY DAY   Investigational - Study Medication Study name: Semaglutide 3 mg or placebo tablets - 7 tablets   Jardiance 10 MG Tabs tablet Generic drug: empagliflozin Take 10 mg by mouth daily before breakfast.   Levemir FlexTouch 100 UNIT/ML Pen Generic drug: Insulin Detemir Inject 25 Units into the skin 2 (two) times daily.   levothyroxine 50 MCG tablet Commonly known as: SYNTHROID TAKE 1 TABLET (50 MCG TOTAL) BY MOUTH DAILY BEFORE BREAKFAST.   meloxicam 15 MG tablet Commonly known as: MOBIC Take 1 tablet (15 mg total) by mouth daily.   metFORMIN 500 MG 24 hr tablet Commonly known as: GLUCOPHAGE-XR TAKE 2 TABLETS  TWICE DAILY   ONE TOUCH ULTRA TEST test strip Generic drug: glucose blood Frequency:PHARMDIR   Dosage:0.0     Instructions:  Note:testing 3-4xqd, DX Code 250.00 Dose: 1   oxybutynin 10 MG 24 hr tablet Commonly known as: DITROPAN-XL Take 1 tablet (10 mg total) by mouth daily. Started by: Hollice Espy, MD       Allergies:  Allergies  Allergen Reactions  . Propoxyphene Nausea And Vomiting    Other reaction(s): Vomiting  . Lidocaine-Epinephrine     Other reaction(s): Other (See Comments) palpitatins and chest pain  . Other Other (See Comments)    sutures - in her hand-very irritated  . Tape Rash    BANDAIDS. BANDAIDS  . Xylocaine  [Lidocaine Hcl]     Other reaction(s): Other (See Comments) palpitatins and chest pain    Family History: Family History  Problem Relation Age of Onset  . Skin cancer Mother   . Squamous cell carcinoma Mother   . Basal cell carcinoma Mother   . Heart disease Father   . CAD Father   . Breast cancer Neg Hx     Social History:  reports that she quit smoking about 37 years ago. She has never used smokeless tobacco. She reports current alcohol use. She reports that she does not use drugs.  ROS: UROLOGY Frequent Urination?: Yes Hard to postpone urination?: No Burning/pain with urination?: Yes Get up at night to urinate?: Yes Leakage of urine?: Yes Urine stream starts and stops?: No Trouble starting stream?: No Do you have to strain to urinate?: No Blood in urine?: No Urinary tract infection?: Yes Sexually transmitted disease?: No Injury to kidneys or bladder?: No Painful intercourse?: No Weak stream?: No Currently pregnant?: No Vaginal bleeding?: No Last menstrual period?: n  Gastrointestinal Nausea?: No Vomiting?: No Indigestion/heartburn?: No Diarrhea?: No Constipation?: No  Constitutional Fever: No Night sweats?: No Weight loss?: No Fatigue?: No  Skin Skin rash/lesions?: No Itching?: No  Eyes Blurred vision?:  No Double vision?: No  Ears/Nose/Throat Sore throat?: No Sinus problems?: Yes  Hematologic/Lymphatic Swollen glands?: No Easy bruising?: No  Cardiovascular Leg swelling?: No Chest pain?: No  Respiratory Cough?: No Shortness of breath?: No  Endocrine Excessive thirst?: Yes  Musculoskeletal Back pain?: No Joint pain?: No  Neurological Headaches?: No Dizziness?: No  Psychologic Depression?: No Anxiety?: No  Physical Exam: BP 116/65   Pulse 88   Ht 5\' 6"  (1.676 m)   Wt 176 lb (79.8 kg)   BMI 28.41 kg/m   Constitutional:  Alert and oriented, No acute distress. HEENT: [place] AT, moist mucus membranes.  Trachea midline, no masses. Cardiovascular: No clubbing, cyanosis, or edema. Respiratory: Normal respiratory effort, no increased work  of breathing. GI: Abdomen is soft, nontender, nondistended, no abdominal masses, obese Skin: No rashes, bruises or suspicious lesions. Neurologic: Grossly intact, no focal deficits, moving all 4 extremities. Psychiatric: Normal mood and affect.  Laboratory Data: Lab Results  Component Value Date   WBC 7.9 12/25/2018   HGB 12.3 12/25/2018   HCT 37.6 12/25/2018   MCV 88 12/25/2018   PLT 352 12/25/2018    Lab Results  Component Value Date   CREATININE 1.18 (H) 07/25/2019    Lab Results  Component Value Date   HGBA1C 9.2 07/24/2019    Urinalysis Component     Latest Ref Rng & Units 11/02/2019  Color, Urine     YELLOW YELLOW  Appearance     CLEAR CLOUDY (A)  Specific Gravity, Urine     1.005 - 1.030 1.015  pH     5.0 - 8.0 5.5  Glucose, UA     NEGATIVE mg/dL 500 (A)  Hgb urine dipstick     NEGATIVE NEGATIVE  Bilirubin Urine     NEGATIVE NEGATIVE  Ketones, ur     NEGATIVE mg/dL NEGATIVE  Protein     NEGATIVE mg/dL NEGATIVE  Nitrite     NEGATIVE NEGATIVE  Leukocytes,Ua     NEGATIVE MODERATE (A)  Squamous Epithelial / LPF     0 - 5 6-10  WBC, UA     0 - 5 WBC/hpf >50  RBC / HPF     0 - 5 RBC/hpf NONE SEEN   Bacteria, UA     NONE SEEN FEW (A)  WBC Clumps      PRESENT    Pertinent Imaging: Results for orders placed or performed in visit on 11/02/19  Bladder Scan (Post Void Residual) in office  Result Value Ref Range   SCA Result 17     Assessment & Plan:    1. Urge incontinence/ urinary urgency New onset exacerbated by new diabetes medication  Recommend discussion with Dr. [name] re: alternative medication - this solution preferred   In the interim, discussed behavioral modification and pharmacotherapy for symptom control although concern about contributing to polypharmacy  Discussed oxybutynin 10 mg XL including risk of dry mouth, constipation, etc.    Reassess symptoms in 6 weeks  2. Recurrent UTI Presumed UTI x 2 based on frankly positive UA  Discussed proper urine collection technique- if unable to provide in cup, will need cathed UA  Recommend daily probiotic, cranberry tablets, hygiene issues   Discussed topical estrogen cream M-W-Fr pea sized amount as most data driven intervention for recurrent UTIs- no contraindications.  Risks discussed.  Addressed incontinence as contributing factor as above  3. Dysuria Current symptoms external rather than consistent with UTI  UA today contaminated - will not treat unless symptoms worsen onr change  Barrier cream for skin protection  - Bladder Scan (Post Void Residual) in office   Return in about 6 years (around 11/01/2025) for possible pelvic exam/ cath UA, PVR, recheck urinary symptoms.  Hollice Espy, MD  [facility] [address] [place], [place] [address] [phone]

## 2019-11-02 ENCOUNTER — Ambulatory Visit: Payer: Medicare Other | Admitting: Urology

## 2019-11-02 ENCOUNTER — Other Ambulatory Visit: Payer: Self-pay

## 2019-11-02 ENCOUNTER — Encounter: Payer: Self-pay | Admitting: Urology

## 2019-11-02 ENCOUNTER — Other Ambulatory Visit
Admission: RE | Admit: 2019-11-02 | Discharge: 2019-11-02 | Disposition: A | Discharge status: Home / Self Care | Payer: Medicare Other | Attending: Urology | Admitting: Urology

## 2019-11-02 VITALS — BP 116/65 | HR 88 | Ht 66.0 in | Wt 176.0 lb

## 2019-11-02 DIAGNOSIS — N390 Urinary tract infection, site not specified: Secondary | ICD-10-CM

## 2019-11-02 DIAGNOSIS — N3941 Urge incontinence: Secondary | ICD-10-CM

## 2019-11-02 DIAGNOSIS — R300 Dysuria: Secondary | ICD-10-CM | POA: Diagnosis not present

## 2019-11-02 LAB — URINALYSIS, COMPLETE (UACMP) WITH MICROSCOPIC
Bilirubin Urine: NEGATIVE
Glucose, UA: 500 mg/dL — AB
Hgb urine dipstick: NEGATIVE
Ketones, ur: NEGATIVE mg/dL
Nitrite: NEGATIVE
Protein, ur: NEGATIVE mg/dL
RBC / HPF: NONE SEEN RBC/hpf (ref 0–5)
Specific Gravity, Urine: 1.015 (ref 1.005–1.030)
WBC, UA: >50 WBC/hpf (ref 0–5)
pH: 5.5 (ref 5.0–8.0)

## 2019-11-02 LAB — BLADDER SCAN AMB NON-IMAGING: SCA Result: 17

## 2019-11-02 MED ORDER — OXYBUTYNIN CHLORIDE ER 10 MG PO TB24: 10.0000 mg | ORAL_TABLET | Freq: Every day | ORAL | 11 refills | Status: DC

## 2019-11-02 MED ORDER — ESTRADIOL 0.1 MG/GM VA CREA: 1.0000 g | TOPICAL_CREAM | VAGINAL | 12 refills | Status: DC

## 2019-11-02 NOTE — Patient Instructions (Addendum)
1.  Daily probiotic  2. Daily cranberry tablet and/ or D- mannose  3. Topical estrogen cream per urethral meatus (pea sized amount) three times per week, Monday/ Wed / Friday at night before bed  4. Barrier cream for moisture

## 2019-11-05 ENCOUNTER — Telehealth: Payer: Self-pay

## 2019-11-05 NOTE — Telephone Encounter (Signed)
Would need to discuss this in depth in person.   My feeling is that her A1C has not been good so stopping a medication is not wise. On the other hand, cost is always an issue.

## 2019-11-05 NOTE — Telephone Encounter (Signed)
Pt called saying she had an appt for tomorrow to discuss Jardiance but she had to cancel due to having a sore throat and a cold starting this morning.   She wants to know if you think taking Vania Rea is worth the trouble. She said its helping her diabetes but its very expensive for her and "breaking the bank."  She also has to afford her pull-ups which she is paying over $100 a month for because she has so many accidents. She did see urology this week and they started her on a new medication to help prevent so much urine output, and she will follow up with them in 6 weeks.  Wants advise on what to do because Vania Rea is very expensive for her.  Please advise.

## 2019-11-06 ENCOUNTER — Ambulatory Visit: Payer: Medicare Other | Admitting: Internal Medicine

## 2019-11-07 ENCOUNTER — Other Ambulatory Visit: Payer: Self-pay

## 2019-11-07 MED ORDER — METFORMIN HCL ER 500 MG PO TB24: 1000.0000 mg | ORAL_TABLET | Freq: Two times a day (BID) | ORAL | 0 refills | Status: DC

## 2019-11-28 ENCOUNTER — Other Ambulatory Visit: Payer: Self-pay

## 2019-11-28 DIAGNOSIS — E113299 Type 2 diabetes mellitus with mild nonproliferative diabetic retinopathy without macular edema, unspecified eye: Secondary | ICD-10-CM

## 2019-11-28 DIAGNOSIS — I10 Essential (primary) hypertension: Secondary | ICD-10-CM

## 2019-11-28 DIAGNOSIS — E1169 Type 2 diabetes mellitus with other specified complication: Secondary | ICD-10-CM

## 2019-11-28 DIAGNOSIS — E890 Postprocedural hypothyroidism: Secondary | ICD-10-CM

## 2019-11-28 MED ORDER — ENALAPRIL MALEATE 5 MG PO TABS: 5.0000 mg | ORAL_TABLET | Freq: Two times a day (BID) | ORAL | 1 refills | Status: DC

## 2019-11-28 MED ORDER — LEVOTHYROXINE SODIUM 50 MCG PO TABS: 50.0000 ug | ORAL_TABLET | Freq: Every day | ORAL | 1 refills | Status: DC

## 2019-11-28 MED ORDER — FLUOXETINE HCL 40 MG PO CAPS: 40.0000 mg | ORAL_CAPSULE | Freq: Every day | ORAL | 1 refills | Status: DC

## 2019-11-28 MED ORDER — HYDROCHLOROTHIAZIDE 25 MG PO TABS: 25.0000 mg | ORAL_TABLET | Freq: Every day | ORAL | 1 refills | Status: DC

## 2019-11-28 MED ORDER — METFORMIN HCL ER 500 MG PO TB24: 1000.0000 mg | ORAL_TABLET | Freq: Two times a day (BID) | ORAL | 1 refills | Status: DC

## 2019-11-28 MED ORDER — AMLODIPINE BESYLATE 2.5 MG PO TABS: 2.5000 mg | ORAL_TABLET | Freq: Every day | ORAL | 1 refills | Status: DC

## 2019-11-28 MED ORDER — ATORVASTATIN CALCIUM 20 MG PO TABS: 20.0000 mg | ORAL_TABLET | Freq: Every day | ORAL | 1 refills | Status: DC

## 2019-11-28 MED ORDER — LEVEMIR FLEXTOUCH 100 UNIT/ML ~~LOC~~ SOPN: 25.0000 [IU] | PEN_INJECTOR | Freq: Two times a day (BID) | SUBCUTANEOUS | 1 refills | Status: DC

## 2019-12-04 ENCOUNTER — Other Ambulatory Visit: Payer: Self-pay

## 2019-12-13 NOTE — Progress Notes (Incomplete)
12/14/2019 2:02 PM   Angela Rose 05-28-1946 [id number]  Referring provider: Glean Hess, MD [address] [place],  [place] [address]  No chief complaint on file.   HPI: Angela Rose is a 74 yo white F with a history of poorly controlled diabetes who returns today for the evaluation and management of dysuria and rUTIs.   Her most recent Aic was 9.2 from 4 months ago consistent with past values of 9-10. She was started on Jardiance in addition to metformin and insulin and associates her severe increase in urinary frequency and new urge incontinence to that medication.   UA 09/04/19 frankly positive but no urine culture sent.  UA also grossly positive on 09/18/19 but urine culture with mixed flora.  Each time symptoms improved with abx.  Post menopausal.  Not sexually active.    No issues with constipation.  1. ***  *** 2. *** *** 3. *** ***   PMH: Past Medical History:  Diagnosis Date  . Anxiety   . Cancer ([place])    melanoma face  . Depression   . Diabetes mellitus without complication ([place])   . Hyperlipidemia   . Hypertension     Surgical History: Past Surgical History:  Procedure Laterality Date  . ABDOMINAL HYSTERECTOMY    . APPENDECTOMY    . BASAL CELL CARCINOMA EXCISION    . BREAST BIOPSY Right    needle bx-neg  . CATARACT EXTRACTION, BILATERAL Bilateral 11/23/2018  . CORONARY ARTERY BYPASS GRAFT  1996   x 3  . MELANOMA EXCISION    . PARTIAL HYSTERECTOMY  1976   ovaries remain    Home Medications:  Allergies as of 12/14/2019      Reactions   Propoxyphene Nausea And Vomiting   Other reaction(s): Vomiting   Lidocaine-epinephrine    Other reaction(s): Other (See Comments) palpitatins and chest pain   Other Other (See Comments)   sutures - in her hand-very irritated   Tape Rash   BANDAIDS. BANDAIDS   Xylocaine  [lidocaine Hcl]    Other reaction(s): Other (See Comments) palpitatins and chest pain      Medication  List       Accurate as of December 13, 2019  2:02 PM. If you have any questions, ask your nurse or doctor.        amLODipine 2.5 MG tablet Commonly known as: NORVASC Take 1 tablet (2.5 mg total) by mouth daily.   aspirin 81 MG tablet Take by mouth.   atorvastatin 20 MG tablet Commonly known as: LIPITOR Take 1 tablet (20 mg total) by mouth daily.   BD Pen Needle Nano U/F 32G X 4 MM Misc Generic drug: Insulin Pen Needle USE 1  TWICE DAILY   enalapril 5 MG tablet Commonly known as: VASOTEC Take 1 tablet (5 mg total) by mouth 2 (two) times daily.   estradiol 0.1 MG/GM vaginal cream Commonly known as: ESTRACE Place 1 g vaginally 3 (three) times a week.   FLUoxetine 40 MG capsule Commonly known as: PROZAC Take 1 capsule (40 mg total) by mouth daily.   hydrochlorothiazide 25 MG tablet Commonly known as: HYDRODIURIL Take 1 tablet (25 mg total) by mouth daily.   Investigational - Study Medication Study name: Semaglutide 3 mg or placebo tablets - 7 tablets   Levemir FlexTouch 100 UNIT/ML FlexPen Generic drug: insulin detemir Inject 25 Units into the skin 2 (two) times daily.   levothyroxine 50 MCG tablet Commonly known as: SYNTHROID Take  1 tablet (50 mcg total) by mouth daily before breakfast.   meloxicam 15 MG tablet Commonly known as: MOBIC Take 1 tablet (15 mg total) by mouth daily.   metFORMIN 500 MG 24 hr tablet Commonly known as: GLUCOPHAGE-XR Take 2 tablets (1,000 mg total) by mouth 2 (two) times daily.   ONE TOUCH ULTRA TEST test strip Generic drug: glucose blood Frequency:PHARMDIR   Dosage:0.0     Instructions:  Note:testing 3-4xqd, DX Code 250.00 Dose: 1   oxybutynin 10 MG 24 hr tablet Commonly known as: DITROPAN-XL Take 1 tablet (10 mg total) by mouth daily.       Allergies:  Allergies  Allergen Reactions  . Propoxyphene Nausea And Vomiting    Other reaction(s): Vomiting  . Lidocaine-Epinephrine     Other reaction(s): Other (See  Comments) palpitatins and chest pain  . Other Other (See Comments)    sutures - in her hand-very irritated  . Tape Rash    BANDAIDS. BANDAIDS  . Xylocaine  [Lidocaine Hcl]     Other reaction(s): Other (See Comments) palpitatins and chest pain    Family History: Family History  Problem Relation Age of Onset  . Skin cancer Mother   . Squamous cell carcinoma Mother   . Basal cell carcinoma Mother   . Heart disease Father   . CAD Father   . Breast cancer Neg Hx     Social History:  reports that she quit smoking about 37 years ago. She has never used smokeless tobacco. She reports current alcohol use. She reports that she does not use drugs.   Physical Exam: There were no vitals taken for this visit.  Constitutional:  Alert and oriented, No acute distress. HEENT: [place] AT, moist mucus membranes.  Trachea midline, no masses. Cardiovascular: No clubbing, cyanosis, or edema. Respiratory: Normal respiratory effort, no increased work of breathing. GI: Abdomen is soft, nontender, nondistended, no abdominal masses GU: No CVA tenderness Lymph: No cervical or inguinal lymphadenopathy. Skin: No rashes, bruises or suspicious lesions. Neurologic: Grossly intact, no focal deficits, moving all 4 extremities. Psychiatric: Normal mood and affect.  Laboratory Data:  Urinalysis  Pertinent Imaging: ***   Assessment & Plan:    @DIAGMED @  No follow-ups on file.  [facility] [address] [place], [place] [address] [phone]  I, Lucas Mallow, am acting as a scribe for Dr. Hollice Espy,  {Add Scribe Attestation Statement}

## 2019-12-14 ENCOUNTER — Ambulatory Visit: Payer: Medicare Other | Admitting: Urology

## 2019-12-25 ENCOUNTER — Ambulatory Visit (INDEPENDENT_AMBULATORY_CARE_PROVIDER_SITE_OTHER): Payer: Medicare Other | Admitting: Internal Medicine

## 2019-12-25 ENCOUNTER — Encounter: Payer: Self-pay | Admitting: Internal Medicine

## 2019-12-25 ENCOUNTER — Other Ambulatory Visit: Payer: Self-pay

## 2019-12-25 VITALS — BP 136/72 | HR 72 | Temp 96.9°F | Ht 66.0 in | Wt 176.0 lb

## 2019-12-25 DIAGNOSIS — E1169 Type 2 diabetes mellitus with other specified complication: Secondary | ICD-10-CM | POA: Diagnosis not present

## 2019-12-25 DIAGNOSIS — L821 Other seborrheic keratosis: Secondary | ICD-10-CM | POA: Diagnosis not present

## 2019-12-25 DIAGNOSIS — E118 Type 2 diabetes mellitus with unspecified complications: Secondary | ICD-10-CM | POA: Diagnosis not present

## 2019-12-25 DIAGNOSIS — D2272 Melanocytic nevi of left lower limb, including hip: Secondary | ICD-10-CM | POA: Diagnosis not present

## 2019-12-25 DIAGNOSIS — I10 Essential (primary) hypertension: Secondary | ICD-10-CM | POA: Diagnosis not present

## 2019-12-25 DIAGNOSIS — Z0000 Encounter for general adult medical examination without abnormal findings: Secondary | ICD-10-CM

## 2019-12-25 DIAGNOSIS — D485 Neoplasm of uncertain behavior of skin: Secondary | ICD-10-CM | POA: Diagnosis not present

## 2019-12-25 DIAGNOSIS — Z1231 Encounter for screening mammogram for malignant neoplasm of breast: Secondary | ICD-10-CM

## 2019-12-25 DIAGNOSIS — F3341 Major depressive disorder, recurrent, in partial remission: Secondary | ICD-10-CM

## 2019-12-25 DIAGNOSIS — E785 Hyperlipidemia, unspecified: Secondary | ICD-10-CM | POA: Diagnosis not present

## 2019-12-25 DIAGNOSIS — Z85820 Personal history of malignant melanoma of skin: Secondary | ICD-10-CM | POA: Diagnosis not present

## 2019-12-25 DIAGNOSIS — E890 Postprocedural hypothyroidism: Secondary | ICD-10-CM

## 2019-12-25 DIAGNOSIS — Z7189 Other specified counseling: Secondary | ICD-10-CM | POA: Diagnosis not present

## 2019-12-25 LAB — POCT URINALYSIS DIPSTICK
Bilirubin, UA: NEGATIVE
Blood, UA: NEGATIVE
Glucose, UA: NEGATIVE
Ketones, UA: NEGATIVE
Nitrite, UA: NEGATIVE
Protein, UA: POSITIVE — AB
Spec Grav, UA: 1.01 (ref 1.010–1.025)
Urobilinogen, UA: 0.2 E.U./dL
pH, UA: 5 (ref 5.0–8.0)

## 2019-12-25 NOTE — Progress Notes (Signed)
Date:  12/25/2019   Name:  Angela Rose   DOB:  07/04/1946   MRN:  [id number]   Chief Complaint: Annual Exam (Breast Exam. Foot Exam.) Rija Shambaugh Krabill is a 74 y.o. female who presents today for her Complete Annual Exam. She feels fairly well. She reports exercising some. She reports she is sleeping fairly well. She denies breast issues.  Mammogram  11/2018 - ordered DEXA  11/2018 Colonoscopy aged out  2013 Immunization History  Administered Date(s) Administered  . Fluad Quad(high Dose 65+) 07/25/2019  . Influenza, High Dose Seasonal PF 06/20/2017, 06/23/2018  . Influenza,inj,Quad PF,6+ Mos 08/14/2015, 09/27/2016  . PFIZER SARS-COV-2 Vaccination 12/01/2019, 12/23/2019  . Pneumococcal Conjugate-13 02/16/2016  . Pneumococcal Polysaccharide-23 11/15/2008, 10/14/2014    Hypertension This is a chronic problem. The problem is controlled. Pertinent negatives include no chest pain, headaches, palpitations or shortness of breath. Past treatments include diuretics, ACE inhibitors and calcium channel blockers. The current treatment provides significant improvement. Identifiable causes of hypertension include a thyroid problem.  Diabetes She presents for her follow-up diabetic visit. She has type 2 diabetes mellitus. Her disease course has been stable. Pertinent negatives for hypoglycemia include no dizziness, headaches, nervousness/anxiousness or tremors. Pertinent negatives for diabetes include no chest pain, no fatigue, no polydipsia and no polyuria. There are no hypoglycemic complications. Current diabetic treatment includes oral agent (monotherapy) and insulin injections (and study drug probably placebo.  last A1C 9.5.  Stopped Jardiance due to recurrent vaginal infections). Her weight is stable. She monitors blood glucose at home 1-2 x per week. Her breakfast blood glucose range is generally 90-110 mg/dl. An ACE inhibitor/angiotensin II receptor blocker is being taken.   Hyperlipidemia This is a chronic problem. The problem is controlled. Pertinent negatives include no chest pain or shortness of breath. Current antihyperlipidemic treatment includes statins. The current treatment provides significant improvement of lipids.  Depression        This is a chronic problem.The problem is unchanged.  Associated symptoms include no fatigue and no headaches.     The symptoms are aggravated by nothing.  Past treatments include SSRIs - Selective serotonin reuptake inhibitors.  Compliance with treatment is good.  Previous treatment provided significant relief.  Past medical history includes thyroid problem.   Thyroid Problem Presents for follow-up visit. Patient reports no anxiety, constipation, depressed mood, diarrhea, fatigue, leg swelling, palpitations or tremors. The symptoms have been stable. Her past medical history is significant for hyperlipidemia.    Lab Results  Component Value Date   CREATININE 1.18 (H) 07/25/2019   BUN 20 07/25/2019   NA 137 07/25/2019   K 4.7 07/25/2019   CL 98 07/25/2019   CO2 21 07/25/2019   Lab Results  Component Value Date   CHOL 223 (H) 12/25/2018   HDL 50 12/25/2018   LDLCALC 129 (H) 12/25/2018   TRIG 222 (H) 12/25/2018   CHOLHDL 4.5 (H) 12/25/2018   Lab Results  Component Value Date   TSH 4.690 (H) 12/25/2018   Lab Results  Component Value Date   HGBA1C 9.2 07/24/2019   Lab Results  Component Value Date   WBC 7.9 12/25/2018   HGB 12.3 12/25/2018   HCT 37.6 12/25/2018   MCV 88 12/25/2018   PLT 352 12/25/2018   Lab Results  Component Value Date   ALT 15 12/25/2018   AST 20 12/25/2018   ALKPHOS 82 12/25/2018   BILITOT 0.2 12/25/2018     Review of Systems  Constitutional: Negative for chills,  fatigue and fever.  HENT: Negative for congestion, hearing loss, tinnitus, trouble swallowing and voice change.   Eyes: Negative for visual disturbance.  Respiratory: Negative for cough, chest tightness, shortness of  breath and wheezing.   Cardiovascular: Negative for chest pain, palpitations and leg swelling.  Gastrointestinal: Negative for abdominal pain, constipation, diarrhea and vomiting.  Endocrine: Negative for polydipsia and polyuria.  Genitourinary: Negative for dysuria, frequency, genital sores, vaginal bleeding and vaginal discharge.  Musculoskeletal: Positive for back pain. Negative for arthralgias, gait problem and joint swelling.  Skin: Negative for color change and rash.  Neurological: Negative for dizziness, tremors, light-headedness and headaches.  Hematological: Negative for adenopathy. Does not bruise/bleed easily.  Psychiatric/Behavioral: Positive for depression. Negative for dysphoric mood and sleep disturbance. The patient is not nervous/anxious.     Patient Active Problem List   Diagnosis Date Noted  . Atrial fibrillation ([place]) 12/25/2018  . Cataract, nuclear sclerotic, both eyes 11/08/2018  . OAB (overactive bladder) 08/11/2018  . Tinnitus aurium, bilateral 08/11/2018  . Renal cyst, right 05/01/2018  . 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  . Other tear of medial meniscus, current injury, left knee, initial encounter 12/31/2015  . Type II diabetes mellitus with complication ([place]) [id number]  . Hyperlipidemia associated with type 2 diabetes mellitus ([place]) 08/14/2015  . Coronary artery disease involving native coronary artery of native heart without angina pectoris 08/14/2015  . Basal cell carcinoma 08/11/2015  . History of melanoma 08/11/2015  . Major depression in partial remission ([place]) 08/08/2015  . Non-proliferative diabetic retinopathy, both eyes ([place]) 08/08/2015  . History of colon polyps 08/08/2015  . Hypothyroidism, postablative 08/08/2015  . Carotid artery plaque 09/03/2014  . Essential (primary) hypertension 09/03/2014  . APC (atrial premature contractions) 09/03/2014    Allergies  Allergen Reactions  . Propoxyphene Nausea And Vomiting    Other reaction(s): Vomiting   . Lidocaine-Epinephrine     Other reaction(s): Other (See Comments) palpitatins and chest pain  . Other Other (See Comments)    sutures - in her hand-very irritated  . Tape Rash    BANDAIDS. BANDAIDS  . Xylocaine  [Lidocaine Hcl]     Other reaction(s): Other (See Comments) palpitatins and chest pain    Past Surgical History:  Procedure Laterality Date  . ABDOMINAL HYSTERECTOMY    . APPENDECTOMY    . BASAL CELL CARCINOMA EXCISION    . BREAST BIOPSY Right    needle bx-neg  . CATARACT EXTRACTION, BILATERAL Bilateral 11/23/2018  . CORONARY ARTERY BYPASS GRAFT  1996   x 3  . MELANOMA EXCISION    . PARTIAL HYSTERECTOMY  1976   ovaries remain    Social History   Tobacco Use  . Smoking status: Former Smoker    Quit date: 06/11/1982    Years since quitting: 37.5  . Smokeless tobacco: Never Used  . Tobacco comment: smoking cessation materials not required  Substance Use Topics  . Alcohol use: Yes    Alcohol/week: 0.0 standard drinks    Comment: rare 1 wine glass of wine  . Drug use: No     Medication list has been reviewed and updated.  Current Meds  Medication Sig  . amLODipine (NORVASC) 2.5 MG tablet Take 1 tablet (2.5 mg total) by mouth daily.  Marland Kitchen aspirin 81 MG tablet Take by mouth.  Marland Kitchen atorvastatin (LIPITOR) 20 MG tablet Take 1 tablet (20 mg total) by mouth daily.  . BD PEN NEEDLE NANO U/F 32G X 4 MM MISC USE 1  TWICE DAILY  .  enalapril (VASOTEC) 5 MG tablet Take 1 tablet (5 mg total) by mouth 2 (two) times daily.  Marland Kitchen estradiol (ESTRACE) 0.1 MG/GM vaginal cream Place 1 g vaginally 3 (three) times a week.  Marland Kitchen FLUoxetine (PROZAC) 40 MG capsule Take 1 capsule (40 mg total) by mouth daily.  Marland Kitchen glucose blood (ONE TOUCH ULTRA TEST) test strip Frequency:PHARMDIR   Dosage:0.0     Instructions:  Note:testing 3-4xqd, DX Code 250.00 Dose: 1  . hydrochlorothiazide (HYDRODIURIL) 25 MG tablet Take 1 tablet (25 mg total) by mouth daily.  . Insulin Detemir (LEVEMIR FLEXTOUCH) 100 UNIT/ML  Pen Inject 25 Units into the skin 2 (two) times daily.  . Investigational - Study Medication Study name: Semaglutide 3 mg or placebo tablets - 7 tablets  . levothyroxine (SYNTHROID) 50 MCG tablet Take 1 tablet (50 mcg total) by mouth daily before breakfast.  . meloxicam (MOBIC) 15 MG tablet Take 1 tablet (15 mg total) by mouth daily.  . metFORMIN (GLUCOPHAGE-XR) 500 MG 24 hr tablet Take 2 tablets (1,000 mg total) by mouth 2 (two) times daily.  Marland Kitchen oxybutynin (DITROPAN-XL) 10 MG 24 hr tablet Take 1 tablet (10 mg total) by mouth daily.    PHQ 2/9 Scores 12/25/2019 10/15/2019 09/04/2019 07/25/2019  PHQ - 2 Score 2 6 6 6   PHQ- 9 Score 3 8 7 18     BP Readings from Last 3 Encounters:  12/25/19 136/72  11/02/19 116/65  10/15/19 132/68    Physical Exam Vitals and nursing note reviewed.  Constitutional:      General: She is not in acute distress.    Appearance: She is well-developed.  HENT:     Head: Normocephalic and atraumatic.     Right Ear: Tympanic membrane and ear canal normal.     Left Ear: Tympanic membrane and ear canal normal.     Nose:     Right Sinus: No maxillary sinus tenderness.     Left Sinus: No maxillary sinus tenderness.  Eyes:     General: No scleral icterus.       Right eye: No discharge.        Left eye: No discharge.     Conjunctiva/sclera: Conjunctivae normal.  Neck:     Thyroid: No thyromegaly.     Vascular: No carotid bruit.  Cardiovascular:     Rate and Rhythm: Normal rate and regular rhythm.     Pulses: Normal pulses.     Heart sounds: Normal heart sounds.  Pulmonary:     Effort: Pulmonary effort is normal. No respiratory distress.     Breath sounds: No wheezing.  Chest:     Breasts:        Right: No mass, nipple discharge, skin change or tenderness.        Left: No mass, nipple discharge, skin change or tenderness.  Abdominal:     General: Bowel sounds are normal.     Palpations: Abdomen is soft.     Tenderness: There is no abdominal tenderness.   Musculoskeletal:        General: No tenderness or deformity.     Cervical back: Normal range of motion. No erythema.     Right lower leg: No edema.     Left lower leg: No edema.  Lymphadenopathy:     Cervical: No cervical adenopathy.  Skin:    General: Skin is warm and dry.     Capillary Refill: Capillary refill takes less than 2 seconds.     Findings: No rash.  Neurological:     General: No focal deficit present.     Mental Status: She is alert and oriented to person, place, and time.     Cranial Nerves: No cranial nerve deficit.     Sensory: No sensory deficit.     Deep Tendon Reflexes: Reflexes are normal and symmetric.  Psychiatric:        Mood and Affect: Mood normal.        Speech: Speech normal.        Cognition and Memory: Cognition normal.     Wt Readings from Last 3 Encounters:  12/25/19 176 lb (79.8 kg)  11/02/19 176 lb (79.8 kg)  10/15/19 176 lb (79.8 kg)    BP 136/72   Pulse 72   Temp (!) 96.9 F (36.1 C) (Temporal)   Ht 5\' 6"  (1.676 m)   Wt 176 lb (79.8 kg)   SpO2 98%   BMI 28.41 kg/m   Assessment and Plan: 1. Annual physical exam Normal exam Continue healthy diet, exercise when able - POCT urinalysis dipstick  2. Encounter for screening mammogram for breast cancer Schedule mammogram at [facility]  3. Essential (primary) hypertension Clinically stable exam with well controlled BP on amlodipine, hctz and enalapril. Tolerating medications without side effects at this time. Pt to continue current regimen and low sodium diet; benefits of regular exercise as able discussed. - CBC with Differential/Platelet  4. Hyperlipidemia associated with type 2 diabetes mellitus ([place]) Tolerating statin medication without side effects at this time LDL is at goal of < 70 on current dose Continue same therapy without change at this time. - Lipid panel  5. Type II diabetes mellitus with complication (HCC) BS are not controlled on current agents. Will consolidate  Levemir to 50 U AM and add Humalog 15 U before supper Continue metformin and study placebo - Comprehensive metabolic panel - Hemoglobin A1c  6. Hypothyroidism, postablative supplemented - TSH + free T4  7. Recurrent major depressive disorder, in partial remission (HCC) Clinically stable on current regimen with good control of symptoms, No SI or HI. Will continue current therapy.   Partially dictated using Editor, commissioning. Any errors are unintentional.  Halina Maidens, MD [facility]  12/25/2019

## 2019-12-25 NOTE — Patient Instructions (Addendum)
CHANGE: Levemir 50 units in the morning.  ADD: 15 units before supper of Humalog insulin  Monitor blood sugar regularly and let me know if it is improving and when you need a refill.  Schedule Mammogram as soon as possible

## 2019-12-26 LAB — CBC WITH DIFFERENTIAL/PLATELET
Basophils Absolute: 0.1 10*3/uL (ref 0.0–0.2)
Basos: 1 %
EOS (ABSOLUTE): 0.1 10*3/uL (ref 0.0–0.4)
Eos: 2 %
Hematocrit: 37.4 % (ref 34.0–46.6)
Hemoglobin: 12 g/dL (ref 11.1–15.9)
Immature Grans (Abs): 0 10*3/uL (ref 0.0–0.1)
Immature Granulocytes: 1 %
Lymphocytes Absolute: 1.2 10*3/uL (ref 0.7–3.1)
Lymphs: 19 %
MCH: 28.2 pg (ref 26.6–33.0)
MCHC: 32.1 g/dL (ref 31.5–35.7)
MCV: 88 fL (ref 79–97)
Monocytes Absolute: 0.8 10*3/uL (ref 0.1–0.9)
Monocytes: 13 %
Neutrophils Absolute: 3.9 10*3/uL (ref 1.4–7.0)
Neutrophils: 64 %
Platelets: 292 10*3/uL (ref 150–450)
RBC: 4.25 x10E6/uL (ref 3.77–5.28)
RDW: 13.6 % (ref 11.7–15.4)
WBC: 6.1 10*3/uL (ref 3.4–10.8)

## 2019-12-26 LAB — COMPREHENSIVE METABOLIC PANEL
ALT: 11 IU/L (ref 0–32)
AST: 16 IU/L (ref 0–40)
Albumin/Globulin Ratio: 1.6 (ref 1.2–2.2)
Albumin: 4.1 g/dL (ref 3.7–4.7)
Alkaline Phosphatase: 88 IU/L (ref 39–117)
BUN/Creatinine Ratio: 18 (ref 12–28)
BUN: 18 mg/dL (ref 8–27)
Bilirubin Total: 0.2 mg/dL (ref 0.0–1.2)
CO2: 24 mmol/L (ref 20–29)
Calcium: 9.1 mg/dL (ref 8.7–10.3)
Chloride: 100 mmol/L (ref 96–106)
Creatinine, Ser: 0.99 mg/dL (ref 0.57–1.00)
GFR calc Af Amer: 64 mL/min/{1.73_m2} (ref >59)
GFR calc non Af Amer: 56 mL/min/{1.73_m2} — ABNORMAL LOW (ref >59)
Globulin, Total: 2.6 g/dL (ref 1.5–4.5)
Glucose: 236 mg/dL — ABNORMAL HIGH (ref 65–99)
Potassium: 4.5 mmol/L (ref 3.5–5.2)
Sodium: 139 mmol/L (ref 134–144)
Total Protein: 6.7 g/dL (ref 6.0–8.5)

## 2019-12-26 LAB — LIPID PANEL
Chol/HDL Ratio: 3.7 ratio (ref 0.0–4.4)
Cholesterol, Total: 177 mg/dL (ref 100–199)
HDL: 48 mg/dL (ref >39)
LDL Chol Calc (NIH): 103 mg/dL — ABNORMAL HIGH (ref 0–99)
Triglycerides: 149 mg/dL (ref 0–149)
VLDL Cholesterol Cal: 26 mg/dL (ref 5–40)

## 2019-12-26 LAB — TSH+FREE T4
Free T4: 1.06 ng/dL (ref 0.82–1.77)
TSH: 1.85 u[IU]/mL (ref 0.450–4.500)

## 2019-12-26 LAB — HEMOGLOBIN A1C
Est. average glucose Bld gHb Est-mCnc: 206 mg/dL
Hgb A1c MFr Bld: 8.8 % — ABNORMAL HIGH (ref 4.8–5.6)

## 2020-01-29 ENCOUNTER — Other Ambulatory Visit: Payer: Self-pay | Admitting: Internal Medicine

## 2020-02-05 DIAGNOSIS — I10 Essential (primary) hypertension: Secondary | ICD-10-CM | POA: Diagnosis not present

## 2020-02-05 DIAGNOSIS — I25810 Atherosclerosis of coronary artery bypass graft(s) without angina pectoris: Secondary | ICD-10-CM | POA: Diagnosis not present

## 2020-02-05 DIAGNOSIS — E782 Mixed hyperlipidemia: Secondary | ICD-10-CM | POA: Diagnosis not present

## 2020-02-05 DIAGNOSIS — I6523 Occlusion and stenosis of bilateral carotid arteries: Secondary | ICD-10-CM | POA: Diagnosis not present

## 2020-03-08 ENCOUNTER — Other Ambulatory Visit: Payer: Self-pay | Admitting: Internal Medicine

## 2020-03-08 NOTE — Telephone Encounter (Signed)
Change of pharmacy Requested Prescriptions  Pending Prescriptions Disp Refills  . metFORMIN (GLUCOPHAGE-XR) 500 MG 24 hr tablet [Pharmacy Med Name: metFORMIN HCl ER 500 MG Oral Tablet Extended Release 24 Hour] 120 tablet 0    Sig: Take 2 tablets by mouth twice daily     Endocrinology:  Diabetes - Biguanides Failed - 03/08/2020  1:17 PM      Failed - HBA1C is between 0 and 7.9 and within 180 days    Hemoglobin A1C  Date Value Ref Range Status  07/24/2019 9.2  Final   Hgb A1c MFr Bld  Date Value Ref Range Status  12/25/2019 8.8 (H) 4.8 - 5.6 % Final    Comment:             Prediabetes: 5.7 - 6.4          Diabetes: >6.4          Glycemic control for adults with diabetes: <7.0          Passed - Cr in normal range and within 360 days    Creatinine, Ser  Date Value Ref Range Status  12/25/2019 0.99 0.57 - 1.00 mg/dL Final         Passed - eGFR in normal range and within 360 days    GFR calc Af Amer  Date Value Ref Range Status  12/25/2019 64 >59 mL/min/1.73 Final   GFR calc non Af Amer  Date Value Ref Range Status  12/25/2019 56 (L) >59 mL/min/1.73 Final         Passed - Valid encounter within last 6 months    Recent Outpatient Visits          2 months ago Annual physical exam   [facility] Glean Hess, MD   5 months ago Acute cystitis without hematuria   [facility] Glean Hess, MD   6 months ago Acute cystitis with hematuria   [facility] Glean Hess, MD   7 months ago Type 2 diabetes mellitus with microalbuminuria, with long-term current use of insulin [facility])   [facility] Glean Hess, MD   1 year ago Irregular heart rhythm   [facility] Glean Hess, MD      Future Appointments            In 1 month [name], [name], MD [facility]

## 2020-04-06 DIAGNOSIS — R05 Cough: Secondary | ICD-10-CM | POA: Diagnosis not present

## 2020-04-06 DIAGNOSIS — R0981 Nasal congestion: Secondary | ICD-10-CM | POA: Diagnosis not present

## 2020-05-01 LAB — HEMOGLOBIN A1C: Hemoglobin A1C: 8.9

## 2020-05-02 ENCOUNTER — Other Ambulatory Visit: Payer: Self-pay

## 2020-05-02 ENCOUNTER — Encounter: Payer: Self-pay | Admitting: Internal Medicine

## 2020-05-02 ENCOUNTER — Ambulatory Visit (INDEPENDENT_AMBULATORY_CARE_PROVIDER_SITE_OTHER): Payer: Medicare Other | Admitting: Internal Medicine

## 2020-05-02 VITALS — BP 130/56 | HR 77 | Temp 98.8°F | Ht 66.0 in | Wt 171.0 lb

## 2020-05-02 DIAGNOSIS — I4891 Unspecified atrial fibrillation: Secondary | ICD-10-CM | POA: Diagnosis not present

## 2020-05-02 DIAGNOSIS — E118 Type 2 diabetes mellitus with unspecified complications: Secondary | ICD-10-CM

## 2020-05-02 DIAGNOSIS — E1169 Type 2 diabetes mellitus with other specified complication: Secondary | ICD-10-CM | POA: Diagnosis not present

## 2020-05-02 DIAGNOSIS — E785 Hyperlipidemia, unspecified: Secondary | ICD-10-CM | POA: Diagnosis not present

## 2020-05-02 NOTE — Progress Notes (Signed)
Date:  05/02/2020   Name:  Angela Rose   DOB:  1946-09-08   MRN:  [id number]   Chief Complaint: Diabetes (follow up last reading this morning-76 A1C yesterday study at [facility] done by finger 8.9)  Diabetes She presents for her follow-up diabetic visit. She has type 2 diabetes mellitus. Pertinent negatives for hypoglycemia include no headaches or tremors. Pertinent negatives for diabetes include no chest pain, no fatigue, no polydipsia and no polyuria. Current diabetic treatment includes insulin injections and oral agent (monotherapy) (novolog added). She is following a generally healthy diet. An ACE inhibitor/angiotensin II receptor blocker is being taken.  Hypertension This is a chronic problem. The problem is controlled. Pertinent negatives include no chest pain, headaches, palpitations or shortness of breath. Past treatments include ACE inhibitors, calcium channel blockers and diuretics. The current treatment provides significant improvement.   She is getting the placebo in the rybelsus study.  Her am glucoses are frequently below 90 but she is not sx.  She is taking levemir 30 units bid.  She stopped the pre supper Novolog.  She is still on metformin.  She does not check her BS before supper.  Lab Results  Component Value Date   CREATININE 0.99 12/25/2019   BUN 18 12/25/2019   NA 139 12/25/2019   K 4.5 12/25/2019   CL 100 12/25/2019   CO2 24 12/25/2019   Lab Results  Component Value Date   CHOL 177 12/25/2019   HDL 48 12/25/2019   LDLCALC 103 (H) 12/25/2019   TRIG 149 12/25/2019   CHOLHDL 3.7 12/25/2019   Lab Results  Component Value Date   TSH 1.850 12/25/2019   Lab Results  Component Value Date   HGBA1C 8.9 05/01/2020   Lab Results  Component Value Date   WBC 6.1 12/25/2019   HGB 12.0 12/25/2019   HCT 37.4 12/25/2019   MCV 88 12/25/2019   PLT 292 12/25/2019   Lab Results  Component Value Date   ALT 11 12/25/2019   AST 16 12/25/2019   ALKPHOS 88  12/25/2019   BILITOT 0.2 12/25/2019     Review of Systems  Constitutional: Negative for appetite change, fatigue, fever and unexpected weight change.  HENT: Negative for tinnitus and trouble swallowing.   Eyes: Negative for visual disturbance.  Respiratory: Negative for cough, chest tightness and shortness of breath.   Cardiovascular: Negative for chest pain, palpitations and leg swelling.  Gastrointestinal: Negative for abdominal pain.  Endocrine: Negative for polydipsia and polyuria.  Genitourinary: Negative for dysuria and hematuria.  Musculoskeletal: Negative for arthralgias.  Neurological: Negative for tremors, numbness and headaches.  Psychiatric/Behavioral: Negative for dysphoric mood.    Patient Active Problem List   Diagnosis Date Noted  . Atrial fibrillation ([place]) 12/25/2018  . Cataract, nuclear sclerotic, both eyes 11/08/2018  . OAB (overactive bladder) 08/11/2018  . Tinnitus aurium, bilateral 08/11/2018  . Renal cyst, right 05/01/2018  . 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  . Other tear of medial meniscus, current injury, left knee, initial encounter 12/31/2015  . Type II diabetes mellitus with complication ([place]) 81/27/5170  . Hyperlipidemia associated with type 2 diabetes mellitus ([place]) 08/14/2015  . Coronary artery disease involving native coronary artery of native heart without angina pectoris 08/14/2015  . Basal cell carcinoma 08/11/2015  . History of melanoma 08/11/2015  . Major depression in partial remission ([place]) 08/08/2015  . Non-proliferative diabetic retinopathy, both eyes ([place]) 08/08/2015  . History of colon polyps 08/08/2015  . Hypothyroidism, postablative 08/08/2015  .  Carotid artery plaque 09/03/2014  . Essential (primary) hypertension 09/03/2014  . APC (atrial premature contractions) 09/03/2014    Allergies  Allergen Reactions  . Propoxyphene Nausea And Vomiting    Other reaction(s): Vomiting  . Lidocaine-Epinephrine     Other reaction(s): Other  (See Comments) palpitatins and chest pain  . Other Other (See Comments)    sutures - in her hand-very irritated  . Tape Rash    BANDAIDS. BANDAIDS  . Xylocaine  [Lidocaine Hcl]     Other reaction(s): Other (See Comments) palpitatins and chest pain  . Jardiance [Empagliflozin] Other (See Comments)    Recurrent vaginal infections    Past Surgical History:  Procedure Laterality Date  . ABDOMINAL HYSTERECTOMY    . APPENDECTOMY    . BASAL CELL CARCINOMA EXCISION    . BREAST BIOPSY Right    needle bx-neg  . CATARACT EXTRACTION, BILATERAL Bilateral 11/23/2018  . CORONARY ARTERY BYPASS GRAFT  1996   x 3  . MELANOMA EXCISION    . PARTIAL HYSTERECTOMY  1976   ovaries remain    Social History   Tobacco Use  . Smoking status: Former Smoker    Quit date: 06/11/1982    Years since quitting: 37.9  . Smokeless tobacco: Never Used  . Tobacco comment: smoking cessation materials not required  Vaping Use  . Vaping Use: Never used  Substance Use Topics  . Alcohol use: Yes    Alcohol/week: 0.0 standard drinks    Comment: rare 1 wine glass of wine  . Drug use: No     Medication list has been reviewed and updated.  Current Meds  Medication Sig  . amLODipine (NORVASC) 2.5 MG tablet Take 1 tablet (2.5 mg total) by mouth daily.  Marland Kitchen aspirin 81 MG tablet Take by mouth.  Marland Kitchen atorvastatin (LIPITOR) 20 MG tablet Take 1 tablet (20 mg total) by mouth daily.  . BD PEN NEEDLE NANO U/F 32G X 4 MM MISC USE 1  TWICE DAILY  . enalapril (VASOTEC) 5 MG tablet Take 1 tablet (5 mg total) by mouth 2 (two) times daily.  Marland Kitchen FLUoxetine (PROZAC) 40 MG capsule Take 1 capsule (40 mg total) by mouth daily.  Marland Kitchen glucose blood (ONE TOUCH ULTRA TEST) test strip Frequency:PHARMDIR   Dosage:0.0     Instructions:  Note:testing 3-4xqd, DX Code 250.00 Dose: 1  . hydrochlorothiazide (HYDRODIURIL) 25 MG tablet Take 1 tablet (25 mg total) by mouth daily.  . Investigational - Study Medication Study name: Semaglutide 3 mg or  placebo tablets - 7 tablets  . levothyroxine (SYNTHROID) 50 MCG tablet Take 1 tablet (50 mcg total) by mouth daily before breakfast.  . meloxicam (MOBIC) 15 MG tablet Take 1 tablet (15 mg total) by mouth daily. (Patient taking differently: Take 15 mg by mouth as needed. )  . metFORMIN (GLUCOPHAGE-XR) 500 MG 24 hr tablet Take 2 tablets by mouth twice daily    PHQ 2/9 Scores 05/02/2020 12/25/2019 10/15/2019 09/04/2019  PHQ - 2 Score 2 2 6 6   PHQ- 9 Score 3 3 8 7     GAD 7 : Generalized Anxiety Score 05/02/2020  Nervous, Anxious, on Edge 0  Control/stop worrying 0  Worry too much - different things 0  Trouble relaxing 0  Restless 0  Easily annoyed or irritable 0  Afraid - awful might happen 0  Total GAD 7 Score 0  Anxiety Difficulty Not difficult at all    BP Readings from Last 3 Encounters:  05/02/20 (!) 130/56  12/25/19 136/72  11/02/19 116/65    Physical Exam Vitals and nursing note reviewed.  Constitutional:      General: She is not in acute distress.    Appearance: She is well-developed.  HENT:     Head: Normocephalic and atraumatic.  Pulmonary:     Effort: Pulmonary effort is normal. No respiratory distress.  Musculoskeletal:        General: Normal range of motion.  Skin:    General: Skin is warm and dry.     Findings: No rash.  Neurological:     Mental Status: She is alert and oriented to person, place, and time.  Psychiatric:        Behavior: Behavior normal.        Thought Content: Thought content normal.     Wt Readings from Last 3 Encounters:  05/02/20 171 lb (77.6 kg)  12/25/19 176 lb (79.8 kg)  11/02/19 176 lb (79.8 kg)    BP (!) 130/56   Pulse 77   Temp 98.8 F (37.1 C) (Oral)   Ht 5\' 6"  (1.676 m)   Wt 171 lb (77.6 kg)   SpO2 97%   BMI 27.60 kg/m   Assessment and Plan: 1. Type II diabetes mellitus with complication (HCC) Q7Y by finger stick was 8.9 yesterday.  Will check by venipuncture today. Begin checking BS before supper - alternate with  fasting Take Levemir q12 hours If AM BS are consistently < 90, reduce the PM dose to 25 units Resume glimepiride 2 mg with breakfast or lunch each day. - Hemoglobin A1c - Comprehensive metabolic panel  2. Hyperlipidemia associated with type 2 diabetes mellitus ([place]) On modest dose of high intensity statin without side effects. Last LDL 103 Continue low fat diet, exercise  3. Atrial fibrillation, unspecified type ([place]) In SR today Continue to monitor for recurrence. With hx of CAD being followed annually by cardiology   Partially dictated using Dragon software. Any errors are unintentional.  Halina Maidens, MD [facility]  05/02/2020

## 2020-05-02 NOTE — Patient Instructions (Signed)
Resume glimepiride 2 mg before breakfast or lunch.  Take insulin 12 hours apart  If more than half the morning sugars are less than 90, reduce the evening insulin every day by 5 units.

## 2020-05-03 LAB — COMPREHENSIVE METABOLIC PANEL
ALT: 17 IU/L (ref 0–32)
AST: 25 IU/L (ref 0–40)
Albumin/Globulin Ratio: 1.5 (ref 1.2–2.2)
Albumin: 4.2 g/dL (ref 3.7–4.7)
Alkaline Phosphatase: 75 IU/L (ref 48–121)
BUN/Creatinine Ratio: 24 (ref 12–28)
BUN: 30 mg/dL — ABNORMAL HIGH (ref 8–27)
Bilirubin Total: 0.4 mg/dL (ref 0.0–1.2)
CO2: 22 mmol/L (ref 20–29)
Calcium: 9.5 mg/dL (ref 8.7–10.3)
Chloride: 101 mmol/L (ref 96–106)
Creatinine, Ser: 1.24 mg/dL — ABNORMAL HIGH (ref 0.57–1.00)
GFR calc Af Amer: 49 mL/min/{1.73_m2} — ABNORMAL LOW (ref >59)
GFR calc non Af Amer: 42 mL/min/{1.73_m2} — ABNORMAL LOW (ref >59)
Globulin, Total: 2.8 g/dL (ref 1.5–4.5)
Glucose: 104 mg/dL — ABNORMAL HIGH (ref 65–99)
Potassium: 4.4 mmol/L (ref 3.5–5.2)
Sodium: 138 mmol/L (ref 134–144)
Total Protein: 7 g/dL (ref 6.0–8.5)

## 2020-05-03 LAB — HEMOGLOBIN A1C
Est. average glucose Bld gHb Est-mCnc: 203 mg/dL
Hgb A1c MFr Bld: 8.7 % — ABNORMAL HIGH (ref 4.8–5.6)

## 2020-05-06 ENCOUNTER — Other Ambulatory Visit: Payer: Self-pay | Admitting: Internal Medicine

## 2020-05-06 NOTE — Telephone Encounter (Signed)
Medication Refill - Medication:   glimepiride (AMARYL) 2 MG tablet [[id number]]     Preferred Pharmacy (with phone number or street name):     [place], [place] - [address]  [place] [address]  Phone: [phone] Fax: [phone]     Agent: Please be advised that RX refills may take up to 3 business days. We ask that you follow-up with your pharmacy.

## 2020-05-06 NOTE — Telephone Encounter (Signed)
Requested medication (s) are due for refill today: yes  Requested medication (s) are on the active medication list: no  Last refill:  04/21/19  Future visit scheduled: yes  Notes to clinic:  historical med and provider   Requested Prescriptions  Pending Prescriptions Disp Refills   glimepiride (AMARYL) 2 MG tablet      Sig: Take 1 tablet (2 mg total) by mouth daily.      Endocrinology:  Diabetes - Sulfonylureas Failed - 05/06/2020  4:39 PM      Failed - HBA1C is between 0 and 7.9 and within 180 days    Hemoglobin A1C  Date Value Ref Range Status  05/01/2020 8.9  Final   Hgb A1c MFr Bld  Date Value Ref Range Status  05/02/2020 8.7 (H) 4.8 - 5.6 % Final    Comment:             Prediabetes: 5.7 - 6.4          Diabetes: >6.4          Glycemic control for adults with diabetes: <7.0           Passed - Valid encounter within last 6 months    Recent Outpatient Visits           4 days ago Type II diabetes mellitus with complication [facility])   [facility] Glean [facility], [facility]   4 months ago Annual physical exam   [facility] Glean Hess, MD   7 months ago Acute cystitis without hematuria   [facility] Glean Hess, MD   8 months ago Acute cystitis with hematuria   [facility] Glean Hess, MD   9 months ago Type 2 diabetes mellitus with microalbuminuria, with long-term current use of insulin [facility])   [facility] Glean Hess, MD       Future Appointments             In 3 months [name] [name], MD [facility]

## 2020-05-07 MED ORDER — GLIMEPIRIDE 2 MG PO TABS: 2.0000 mg | ORAL_TABLET | Freq: Every day | ORAL | 3 refills | Status: DC

## 2020-05-22 ENCOUNTER — Telehealth: Payer: Self-pay | Admitting: *Deleted

## 2020-05-22 NOTE — Chronic Care Management (AMB) (Signed)
  Chronic Care Management   Note  05/22/2020 Name: Angela Rose MRN: [id number] DOB: 1946-03-13  Angela Rose is a 74 y.o. year old female who is a primary care patient of Glean Hess, MD. I reached out to Angela Rose by phone today in response to a referral sent by Ms. Angela Gentry Rose's health plan.     Angela Rose was given information about Chronic Care Management services today including:  1. CCM service includes personalized support from designated clinical staff supervised by her physician, including individualized plan of care and coordination with other care providers 2. 24/7 contact phone numbers for assistance for urgent and routine care needs. 3. Service will only be billed when office clinical staff spend 20 minutes or more in a month to coordinate care. 4. Only one practitioner may furnish and bill the service in a calendar month. 5. The patient may stop CCM services at any time (effective at the end of the month) by phone call to the office staff. 6. The patient will be responsible for cost sharing (co-pay) of up to 20% of the service fee (after annual deductible is met).  Patient agreed to services and verbal consent obtained.   Follow up plan: Telephone appointment with care management team member scheduled for: 06/05/2020  [place], [place], [place] [address] Direct Dial: [email or web address] Website: [email or web address]

## 2020-05-28 ENCOUNTER — Telehealth: Payer: Self-pay | Admitting: *Deleted

## 2020-05-28 NOTE — Chronic Care Management (AMB) (Signed)
  Chronic Care Management   Note  05/28/2020 Name: Angela Rose MRN: [id number] DOB: March 31, 1946  Angela Rose is a 74 y.o. year old female who is a primary care patient of Glean Hess, MD and is actively engaged with the care management team. I reached out to Dene Gentry Savich by phone today to assist with re-scheduling an initial visit with the Pharmacist  Follow up plan: Unsuccessful telephone outreach attempt made. A HIPPA compliant phone message was left for the patient providing contact information and requesting a return call.  The care management team will reach out to the patient again over the next 7 days.  If patient returns call to provider office, please advise to call [place] at [phone].  [place], [place] [address] Direct Dial: [phone] [email or web address]: [email or web address]

## 2020-05-29 NOTE — Chronic Care Management (AMB) (Signed)
  Chronic Care Management   Note  05/29/2020 Name: Nawaal Alling MRN: [id number] DOB: Nov 27, 1945  Angela Rose is a 74 y.o. year old female who is a primary care patient of Glean Hess, MD and is actively engaged with the care management team. I reached out to Dene Gentry Kundinger by phone today to assist with re-scheduling an initial visit with the Pharmacist.  Follow up plan: Telephone appointment with care management team member scheduled for:06/12/2020  [place], [place] [facility]  [place], [place] [address] Direct Dial: [email or web address] Website: [email or web address]

## 2020-06-05 ENCOUNTER — Telehealth: Payer: Medicare Other

## 2020-06-10 ENCOUNTER — Other Ambulatory Visit: Payer: Self-pay | Admitting: Internal Medicine

## 2020-06-10 DIAGNOSIS — E890 Postprocedural hypothyroidism: Secondary | ICD-10-CM

## 2020-06-10 DIAGNOSIS — I10 Essential (primary) hypertension: Secondary | ICD-10-CM

## 2020-06-10 DIAGNOSIS — E1169 Type 2 diabetes mellitus with other specified complication: Secondary | ICD-10-CM

## 2020-06-10 NOTE — Telephone Encounter (Signed)
Requested Prescriptions  Pending Prescriptions Disp Refills  . FLUoxetine (PROZAC) 40 MG capsule [Pharmacy Med Name: FLUOXETINE  40MG   CAP] 90 capsule 3    Sig: TAKE 1 CAPSULE BY MOUTH  DAILY     Psychiatry:  Antidepressants - SSRI Passed - 06/10/2020  5:47 AM      Passed - Completed PHQ-2 or PHQ-9 in the last 360 days.      Passed - Valid encounter within last 6 months    Recent Outpatient Visits          1 month ago Type II diabetes mellitus with complication [facility])   [facility]   5 months ago Annual physical exam   [facility] Glean Hess, MD   8 months ago Acute cystitis without hematuria   [facility] Glean Hess, MD   9 months ago Acute cystitis with hematuria   [facility] Glean Hess, MD   10 months ago Type 2 diabetes mellitus with microalbuminuria, with long-term current use of insulin [facility])   [facility] Gle[facility], MD      Future Appointments            In 2 months Glean Hess, MD [facility]           . amLODipine (NORVASC) 2.5 MG tablet [Pharmacy Med Name: amLODIPine Besylate 2.5 MG Oral Tablet] 90 tablet 3    Sig: TAKE 1 TABLET BY MOUTH  DAILY     Cardiovascular:  Calcium Channel Blockers Passed - 06/10/2020  5:47 AM      Passed - Last BP in normal range    BP Readings from Last 1 Encounters:  05/02/20 (!) 130/56         Passed - Valid encounter within last 6 months    Recent Outpatient Visits          1 month ago Type II diabetes mellitus with complication [facility])   [facility], [facility]   5 months ago Annual physical exam   [facility] Glean Hess, MD   8 months ago Acute cystitis without hematuria   [facility] Glean Hess, MD   9 months ago Acute cystitis with hematuria   [facility] Glean Hess, MD   10 months ago Type 2 diabetes mellitus with microalbuminuria,  with long-term current use of insulin [facility])   [facility] Gle[facility], MD      Future Appointments            In 2 months Glean Hess, MD [facility]           . enalapril (VASOTEC) 5 MG tablet [Pharmacy Med Name: ENALAPRIL  5MG   TAB] 180 tablet 3    Sig: TAKE 1 TABLET BY MOUTH  TWICE DAILY     Cardiovascular:  ACE Inhibitors Failed - 06/10/2020  5:47 AM      Failed - Cr in normal range and within 180 days    Creatinine, Ser  Date Value Ref Range Status  05/02/2020 1.24 (H) 0.57 - 1.00 mg/dL Final         Passed - K in normal range and within 180 days    Potassium  Date Value Ref Range Status  05/02/2020 4.4 3.5 - 5.2 mmol/L Final         Passed - Patient is not pregnant  Passed - Last BP in normal range    BP Readings from Last 1 Encounters:  05/02/20 (!) 130/56         Passed - Valid encounter within last 6 months    Recent Outpatient Visits          1 month ago Type II diabetes mellitus with complication [facility])   [facility], [facility]   5 months ago Annual physical exam   [facility] Glean Hess, MD   8 months ago Acute cystitis without hematuria   [facility] Gle[facility], MD   9 months ago Acute cystitis with hematuria   [facility] Glean Hess, MD   10 months ago Type 2 diabetes mellitus with microalbuminuria, with long-term current use of insulin [facility])   [facility] Glean Hess, MD      Future Appointments            In 2 months Glean Hess, MD [facility]           . hydrochlorothiazide (HYDRODIURIL) 25 MG tablet [Pharmacy Med Name: hydroCHLOROthiazide 25 MG Oral Tablet] 90 tablet 3    Sig: TAKE 1 TABLET BY MOUTH  DAILY     Cardiovascular: Diuretics - Thiazide Failed - 06/10/2020  5:47 AM      Failed - Cr in normal range and within 360 days    Creatinine, Ser  Date Value Ref Range Status  05/02/2020  1.24 (H) 0.57 - 1.00 mg/dL Final         Passed - Ca in normal range and within 360 days    Calcium  Date Value Ref Range Status  05/02/2020 9.5 8.7 - 10.3 mg/dL Final         Passed - K in normal range and within 360 days    Potassium  Date Value Ref Range Status  05/02/2020 4.4 3.5 - 5.2 mmol/L Final         Passed - Na in normal range and within 360 days    Sodium  Date Value Ref Range Status  05/02/2020 138 134 - 144 mmol/L Final         Passed - Last BP in normal range    BP Readings from Last 1 Encounters:  05/02/20 (!) 130/56         Passed - Valid encounter within last 6 months    Recent Outpatient Visits          1 month ago Type II diabetes mellitus with complication [facility])   [facility] Gle[facility], MD   5 months ago Annual physical exam   [facility] Glean Hess, MD   8 months ago Acute cystitis without hematuria   [facility] Glean Hess, MD   9 months ago Acute cystitis with hematuria   [facility] Glean Hess, MD   10 months ago Type 2 diabetes mellitus with microalbuminuria, with long-term current use of insulin [facility])   [facility] Glean Hess, MD      Future Appointments            In 2 months [name] [name], MD [facility], [facility]           . levothyroxine (SYNTHROID) 50 MCG tablet [Pharmacy Med Name: Levothyroxine Sodium 50 MCG Oral Tablet] 90 tablet 3    Sig: TAKE 1 TABLET BY MOUTH  DAILY BEFORE BREAKFAST     Endocrinology:  Hypothyroid Agents Failed - 06/10/2020  5:47 AM      Failed - TSH needs to be rechecked within 3 months after an abnormal result. Refill until TSH is due.      Passed - TSH in normal range and within 360 days    TSH  Date Value Ref Range Status  12/25/2019 1.850 0.450 - 4.500 uIU/mL Final         Passed - Valid encounter within last 12 months    Recent Outpatient Visits          1 month ago Type II diabetes mellitus with  complication [facility])   [facility] Gle[facility], [facility]   5 months ago Annual physical exam   [facility], MD   8 months ago Acute cystitis without hematuria   [facility] Glean Hess, MD   9 months ago Acute cystitis with hematuria   [facility] Glean Hess, MD   10 months ago Type 2 diabetes mellitus with microalbuminuria, with long-term current use of insulin [facility])   [facility] Gle[facility], MD      Future Appointments            In 2 months [name] [name], MD [facility] [place]           . atorvastatin (LIPITOR) 20 MG tablet [Pharmacy Med Name: Atorvastatin Calcium 20 MG Oral Tablet] 90 tablet 3    Sig: TAKE 1 TABLET BY MOUTH  DAILY     Cardiovascular:  Antilipid - Statins Failed - 06/10/2020  5:47 AM      Failed - LDL in normal range and within 360 days    LDL Chol Calc (NIH)  Date Value Ref Range Status  12/25/2019 103 (H) 0 - 99 mg/dL Final         Passed - Total Cholesterol in normal range and within 360 days    Cholesterol, Total  Date Value Ref Range Status  12/25/2019 177 100 - 199 mg/dL Final         Passed - HDL in normal range and within 360 days    HDL  Date Value Ref Range Status  12/25/2019 48 >39 mg/dL Final         Passed - Triglycerides in normal range and within 360 days    Triglycerides  Date Value Ref Range Status  12/25/2019 149 0 - 149 mg/dL Final         Passed - Patient is not pregnant      Passed - Valid encounter within last 12 months    Recent Outpatient Visits          1 month ago Type II diabetes mellitus with complication [facility])   [facility], MD   5 months ago Annual physical exam   [facility] Glean Hess, MD   8 months ago Acute cystitis without hematuria   [facility] Glean Hess, MD   9 months ago Acute cystitis with hematuria   [facility] Glean Hess, MD   10 months ago Type 2 diabetes mellitus with microalbuminuria, with long-term current use of insulin [facility])   [facility] Gle[facility] Hess, MD      Future Appointments            In 2 months Glean Hess, MD  [place]

## 2020-06-12 ENCOUNTER — Ambulatory Visit: Payer: Medicare Other

## 2020-06-12 ENCOUNTER — Other Ambulatory Visit: Payer: Self-pay

## 2020-06-12 DIAGNOSIS — E118 Type 2 diabetes mellitus with unspecified complications: Secondary | ICD-10-CM

## 2020-06-12 DIAGNOSIS — F32A Depression, unspecified: Secondary | ICD-10-CM

## 2020-06-12 DIAGNOSIS — E1169 Type 2 diabetes mellitus with other specified complication: Secondary | ICD-10-CM

## 2020-06-12 NOTE — Chronic Care Management (AMB) (Signed)
Chronic Care Management Pharmacy  Name: Angela Rose  MRN: [id number] DOB: 1946-01-04   Chief Complaint/ HPI  Angela Rose,  74 y.o. , female presents for their Initial CCM visit with the clinical pharmacist via telephone due to COVID-19 Pandemic.  PCP : Glean Hess, MD Patient Care Team: Glean Hess, MD as PCP - General (Internal Medicine) Corey Skains, MD as Consulting Physician (Cardiology) Hollice Espy, MD as Consulting Physician (Urology) Vladimir Faster, [facility] (Pharmacist)  Their chronic conditions include: Hypertension, Hyperlipidemia, Diabetes,  Coronary Artery Disease, Hypothyroidism and Depression   Office Visit 05/02/20- Dr. [name] - DM follow up, resume glimepiride 76m ac b or lunch, levemir q 12 reduce by 5 u if FBG<,90  Consult Visit: 04/30/20-[facility] study visit (Oral semaglutide CV outcomes in pts with DM2) 04/02/20- Urgent Care- Concern fo rCOVID 02/06/20- [facility] study visit- Levemir changed to 30 u bid 02/05/20-Dr. KNehemiah [facility]-  Allergies  Allergen Reactions  . Propoxyphene Nausea And Vomiting    Other reaction(s): Vomiting  . Lidocaine-Epinephrine     Other reaction(s): Other (See Comments) palpitatins and chest pain  . Other Other (See Comments)    sutures - in her hand-very irritated  . Tape Rash    BANDAIDS. BANDAIDS  . Xylocaine  [Lidocaine Hcl]     Other reaction(s): Other (See Comments) palpitatins and chest pain  . Jardiance [Empagliflozin] Other (See Comments)    Recurrent vaginal infections    Medications: Outpatient Encounter Medications as of 06/12/2020  Medication Sig  . amLODipine (NORVASC) 2.5 MG tablet TAKE 1 TABLET BY MOUTH  DAILY (Patient taking differently: every evening. )  . aspirin 81 MG tablet Take by mouth every evening.   .Marland Kitchenatorvastatin (LIPITOR) 20 MG tablet TAKE 1 TABLET BY MOUTH  DAILY  . BD PEN NEEDLE NANO U/F 32G X 4 MM MISC USE 1  TWICE DAILY  . Cyanocobalamin 5000 MCG  LOZG Take 1 tablet by mouth daily.  . enalapril (VASOTEC) 5 MG tablet TAKE 1 TABLET BY MOUTH  TWICE DAILY  . FLUoxetine (PROZAC) 40 MG capsule TAKE 1 CAPSULE BY MOUTH  DAILY  . glimepiride (AMARYL) 2 MG tablet Take 1 tablet (2 mg total) by mouth daily.  .Marland Kitchenglucose blood (ONE TOUCH ULTRA TEST) test strip Frequency:PHARMDIR   Dosage:0.0     Instructions:  Note:testing 3-4xqd, DX Code 250.00 Dose: 1  . hydrochlorothiazide (HYDRODIURIL) 25 MG tablet TAKE 1 TABLET BY MOUTH  DAILY  . Investigational - Study Medication Study name: Semaglutide 3 mg or placebo tablets - 7 tablets  . levothyroxine (SYNTHROID) 50 MCG tablet TAKE 1 TABLET BY MOUTH  DAILY BEFORE BREAKFAST  . metFORMIN (GLUCOPHAGE-XR) 500 MG 24 hr tablet Take 2 tablets by mouth twice daily  . meloxicam (MOBIC) 15 MG tablet Take 1 tablet (15 mg total) by mouth daily. (Patient taking differently: Take 15 mg by mouth as needed. )   No facility-administered encounter medications on file as of 06/12/2020.     Current Diagnosis/Assessment:    Goals Addressed            This Visit's Progress   . PharmD " I want to be healthy"       CARE PLAN ENTRY (see longitudinal plan of care for additional care plan information)  Current Barriers:  . Chronic Disease Management support, education, and care coordination needs related to Hypertension, Hyperlipidemia, and Diabetes   Hypertension BP Readings from Last 3 Encounters:  05/02/20 (!) 130/56  12/25/19 136/72  11/02/19 116/65   . Pharmacist Clinical Goal(s): o Over the next 60 days, patient will work with PharmD and providers to maintain BP goal <130/80 . Current regimen:  . Enalapril 23m bid . Amlodipine 2.567m qd . Hydrochlorothiazide 25 mg qd . Interventions: . Comprehensive medication review performed, medication list updated in electronic medical record . Inter-disciplinary care team collaboration (see longitudinal plan of care) o Will determine insurance coverage options for help  obtaining meter . Patient self care activities - Over the next 60 days, patient will: o Check BP daily, document, and provide at future appointments o Ensure daily salt intake < 2300 mg/day o Increase exercise with goal of 30 min/day 5 days/week  Hyperlipidemia Lab Results  Component Value Date/Time   LDLCALC 103 (H) 12/25/2019 11:08 AM   . Pharmacist Clinical Goal(s): o Over the next 60 days, patient will work with PharmD and providers to achieve LDL goal < 70 . Current regimen:  . Atorvastatin 20 mg qd .  ASA 81 mg qd . Interventions: . Comprehensive medication review performed, medication list updated in electronic medical record . Inter-disciplinary care team collaboration (see longitudinal plan of care) o Will discuss increasing atorvastatin dose to 40 mg with PCP . Patient self care activities - Over the next 60 days, patient will: o Take medications as prescribed o Increase exercise with goal of 30 min/day 5 days/week o Focus on DASH diet  Diabetes Lab Results  Component Value Date/Time   HGBA1C 8.7 (H) 05/02/2020 10:17 AM   HGBA1C 8.9 05/01/2020 12:00 AM   HGBA1C 8.8 (H) 12/25/2019 11:08 AM   HGBA1C 9.2 07/24/2019 12:00 AM   . Pharmacist Clinical Goal(s): o Over the next 60 days, patient will work with PharmD and providers to achieve A1c goal <7% . Current regimen:   Glimepiride 43m50md  Insulin detemir (Levemir) 30 u bid  Metformin er 500 mg 2 tabs bid  [facility] study drug 3mg443m tabs qd (semaglutide oral vs. placebo . Interventions: . Comprehensive medication review performed, medication list updated in electronic medical record . Inter-disciplinary care team collaboration (see longitudinal plan of care) o Will collaborate with PCP for optimized therapy to achieve A1C goal. Recommend decreasing evening dose of Levemir to 20 units with further reduction of 2 units each day FBG < 100 with plan to transition to [place] or [place] less fluctuation. Discuss  decreasing glimepiride with goal of discontinuation if possible. Will discuss possibilty of starting GLP-1 RA.  . PaMarland Kitchenient self care activities - Over the next 60 days, patient will: o Check blood sugar twice daily, document, and provide at future appointments o Contact provider with any episodes of hypoglycemia o Provide required portion of patient assistance documentation if needed.  Medication management . Pharmacist Clinical Goal(s): o Over the next 60 days, patient will work with PharmD and providers to achieve optimal medication adherence . Current pharmacy: [facility] .  . InMarland Kitchenerventions o Comprehensive medication review performed. o Continue current medication management strategy . Patient self care activities - Over the next 60 days, patient will: o Take medications as prescribed o Report any questions or concerns to PharmD and/or provider(s)  Initial goal documentation       Diabetes   A1c goal <7%  Recent Relevant Labs: Lab Results  Component Value Date/Time   HGBA1C 8.7 (H) 05/02/2020 10:17 AM   HGBA1C 8.9 05/01/2020 12:00 AM   HGBA1C 8.8 (H) 12/25/2019 11:08 AM   HGBA1C 9.2  07/24/2019 12:00 AM    Last diabetic Eye exam:  Lab Results  Component Value Date/Time   HMDIABEYEEXA Retinopathy (A) 08/08/2018 12:00 AM    Last diabetic Foot exam: No results found for: HMDIABFOOTEX   Checking BG: 2x per Day  Recent FBG Readings: 67 today, 44,50 this week. Reports all fasting reading below 100 this week Recent 2hr PP BG readings:  175-190   Patient has failed these meds in past: Jardiance- polyuria Patient is currently uncontrolled on the following medications:  Glimepiride 284m qd  Insulin detemir (Levemir) 30 u bid  Metformin er 500 mg 2 tabs bid  [facility] study drug 321m 7 tabs qd (semaglutide oral vs. placebo  We discussed: diet and exercise extensively and how to recognize and treat signs of hypoglycemia. Patient states [facility] and study is scheduled to continue for 2 more years.She is convinced she is receiving placebo. She reports frequent symptomatic hypoglycemia but has not consistently decreased her evening Levemir dose. She is not taking prandial insulin and can not tolerate SGLT2 therapy due to frequent UTI's. She endorses a 15-20 lb weight loss. She is willing to disenroll from study if necessary. We discussed hypoglycemia associated with glimepiride and available insulins with longer t1/2 and less fluctuation  Plan  Recommend decrease evening dose of Levemir to 20 units and decrease by 2 units every day FBG<100. Recommend decreasing glimepiride to 1 mg qd with breakfast with goal of discontinuation. Consider disenrollment from study and initiating GLP1 therapy with Ozempic to achieve goal A1c. Consider changing Levemir to [place] or [place] longer t1/2 and decreased hypoglycemic events. Pursue patient assistance as needed to optimize therapy.  Hypertension   BMP Latest Ref Rng & Units 05/02/2020 12/25/2019 07/25/2019  Glucose 65 - 99 mg/dL 104(H) 236(H) 176(H)  BUN 8 - 27 mg/dL 30(H) 18 20  Creatinine 0.57 - 1.00 mg/dL 1.24(H) 0.99 1.18(H)  BUN/Creat Ratio 12 - 28 24 18 17   Sodium 134 - 144 mmol/L 138 139 137  Potassium 3.5 - 5.2 mmol/L 4.4 4.5 4.7  Chloride 96 - 106 mmol/L 101 100 98  CO2 20 - 29 mmol/L 22 24 21   Calcium 8.7 - 10.3 mg/dL 9.5 9.1 9.5    BP goal is:  <130/80 (<120 per Cards given SPRINT trial outcomes)  Office blood pressures are  BP Readings from Last 3 Encounters:  05/02/20 (!) 130/56  12/25/19 136/72  11/02/19 116/65   Patient checks BP at home does not take Patient home BP readings are ranging: unknown  Patient has failed these meds in the past:  Patient is currently controlled on the following medications:  . Enalapril 84m3mid . Amlodipine 2.84mg56md . Hydrochlorothiazide 25 mg qd  We discussed diet and exercise extensively. Patient has  recently lost 15-20 pounds and changed her diet.  She reports no longer having a meter because it dry-rotted. Pharmacy team will investigate insurance coverage and help her obtain a meter. She does not currently have a structured exercise routine and is agreeable to start walking to mailbox twice weekly.  Plan  Continue current medications .      Hyperlipidemia/CAD s/p CABG x 3 96, 93 MI   LDL goal < 70  Lipid Panel     Component Value Date/Time   CHOL 177 12/25/2019 1108   TRIG 149 12/25/2019 1108   HDL 48 12/25/2019 1108   LDLCALC 103 (H) 12/25/2019 1108    Hepatic  Function Latest Ref Rng & Units 05/02/2020 12/25/2019 12/25/2018  Total Protein 6.0 - 8.5 g/dL 7.0 6.7 7.1  Albumin 3.7 - 4.7 g/dL 4.2 4.1 4.3  AST 0 - 40 IU/L 25 16 20   ALT 0 - 32 IU/L 17 11 15   Alk Phosphatase 48 - 121 IU/L 75 88 82  Total Bilirubin 0.0 - 1.2 mg/dL 0.4 0.2 0.2     The ASCVD Risk score ([place]., et al., 2013) failed to calculate for the following reasons:   The patient has a prior MI or stroke diagnosis   Patient has failed these meds in past: lovastatin Patient is currently uncontrolled on the following medications:  . Atorvastatin 20 mg qd .  ASA 81 mg qd  We discussed:  diet and exercise extensively. We discussed cardiovascular benefit of lowering ldl levels. We reviewed signs/symptoms of bleeding with asa therapy.  Plan  Recommend increasing to high intensity dose of atorvastatin 40 mg qd and recheck lipid panel 3-6 months after change.  Depression / Anxiety   PHQ9 Score:  PHQ9 SCORE ONLY 05/02/2020 12/25/2019 10/15/2019  PHQ-9 Total Score 3 3 8    GAD7 Score: GAD 7 : Generalized Anxiety Score 05/02/2020  Nervous, Anxious, on Edge 0  Control/stop worrying 0  Worry too much - different things 0  Trouble relaxing 0  Restless 0  Easily annoyed or irritable 0  Afraid - awful might happen 0  Total GAD 7 Score 0  Anxiety Difficulty Not difficult at all    Patient has failed these  meds in past: None Patient is currently controlled on the following medications:   Fluoxetine 40 mg qd  We discussed:  Patient states that she has been taking since her MI in 1993. Feels that she is still depressed and feels unmotivated daily. Reports sleeping until noon. She reports seeing multiple counselors over the years but does not recall ever seeing a psychiatrist. She would like a referral to psych.  Plan Recommend referral to psych.    Hypothyroidism   Lab Results  Component Value Date/Time   TSH 1.850 12/25/2019 11:08 AM   TSH 4.690 (H) 12/25/2018 11:07 AM   FREET4 1.06 12/25/2019 11:08 AM    Patient has failed these meds in past: N/A Patient is currently controlled on the following medications:  . Levothyroxine 50 mcg qam  We discussed:  Taking levothyroxine 30 min prior to food or other medications.  Plan  Continue current medications. Osteopenia    Last DEXA Scan: 2020  T-Score femoral neck: -1.1  10-year probability of major osteoporotic fracture: 22%  10-year probability of hip fracture: 9.1%  No results found for: VD25OH   Patient is not a candidate for pharmacologic treatment  Patient has failed these meds in past: N/A Patient is currently uncontrolled on the following medications:  . none  We discussed:  Recommend [phone] units of vitamin D daily. Recommend 1200 mg of calcium daily from dietary and supplemental sources.  Plan  Recommend checking Vitamin D level at next appointment. Repeat DEXA scan 2022.  Vaccines   Reviewed and discussed patient's vaccination history.    Immunization History  Administered Date(s) Administered  . Fluad Quad(high Dose 65+) 07/25/2019  . Influenza, High Dose Seasonal PF 06/20/2017, 06/23/2018  . Influenza,inj,Quad PF,6+ Mos 08/14/2015, 09/27/2016  . PFIZER SARS-COV-2 Vaccination 12/01/2019, 12/23/2019  . Pneumococcal Conjugate-13 02/16/2016  . Pneumococcal Polysaccharide-23 11/15/2008, 10/14/2014     Plan  Recommended patient receive flu vaccine when available.    Medication  Management   Pt uses [facility] mail order and [facility] for all medications Uses pill box? No. Patient has vials organized in a tote with caps labeled Pt endorses 90 % compliance    Plan  Continue current medication management strategy    Follow up: 2  month phone visit  Junita Push. Kenton Kingfisher PharmD, [facility] [phone]

## 2020-06-12 NOTE — Progress Notes (Signed)
Ref

## 2020-06-16 NOTE — Patient Instructions (Addendum)
Visit Information  It was a pleasure speaking with you today! Thank you for letting me be a part of your care team. Please call with any questions or concerns.  Goals Addressed            This Visit's Progress   . PharmD " I want to be healthy"       CARE PLAN ENTRY (see longitudinal plan of care for additional care plan information)  Current Barriers:  . Chronic Disease Management support, education, and care coordination needs related to Hypertension, Hyperlipidemia, and Diabetes   Hypertension BP Readings from Last 3 Encounters:  05/02/20 (!) 130/56  12/25/19 136/72  11/02/19 116/65   . Pharmacist Clinical Goal(s): o Over the next 60 days, patient will work with PharmD and providers to maintain BP goal <130/80 . Current regimen:  . Enalapril 5mg  bid . Amlodipine 2.5mg   qd . Hydrochlorothiazide 25 mg qd . Interventions: . Comprehensive medication review performed, medication list updated in electronic medical record . Inter-disciplinary care team collaboration (see longitudinal plan of care) o Will determine insurance coverage options for help obtaining meter . Patient self care activities - Over the next 60 days, patient will: o Check BP daily, document, and provide at future appointments o Ensure daily salt intake < 2300 mg/day o Increase exercise with goal of 30 min/day 5 days/week  Hyperlipidemia Lab Results  Component Value Date/Time   LDLCALC 103 (H) 12/25/2019 11:08 AM   . Pharmacist Clinical Goal(s): o Over the next 60 days, patient will work with PharmD and providers to achieve LDL goal < 70 . Current regimen:  . Atorvastatin 20 mg qd .  ASA 81 mg qd . Interventions: . Comprehensive medication review performed, medication list updated in electronic medical record . Inter-disciplinary care team collaboration (see longitudinal plan of care) o Will discuss increasing atorvastatin dose to 40 mg with PCP . Patient self care activities - Over the next 60 days,  patient will: o Take medications as prescribed o Increase exercise with goal of 30 min/day 5 days/week o Focus on DASH diet  Diabetes Lab Results  Component Value Date/Time   HGBA1C 8.7 (H) 05/02/2020 10:17 AM   HGBA1C 8.9 05/01/2020 12:00 AM   HGBA1C 8.8 (H) 12/25/2019 11:08 AM   HGBA1C 9.2 07/24/2019 12:00 AM   . Pharmacist Clinical Goal(s): o Over the next 60 days, patient will work with PharmD and providers to achieve A1c goal <7% . Current regimen:   Glimepiride 2mg  qd  Insulin detemir (Levemir) 30 u bid  Metformin er 500 mg 2 tabs bid  SOUL study drug 3mg , 7 tabs qd (semaglutide oral vs. placebo . Interventions: . Comprehensive medication review performed, medication list updated in electronic medical record . Inter-disciplinary care team collaboration (see longitudinal plan of care) o Will collaborate with PCP for optimized therapy to achieve A1C goal. Recommend decreasing evening dose of Levemir to 20 units with further reduction of 2 units each day FBG < 100 with plan to transition to [place] or [place] for less fluctuation. Discuss decreasing glimepiride with goal of discontinuation if possible. Will discuss possibilty of starting GLP-1 RA.  Marland Kitchen Patient self care activities - Over the next 60 days, patient will: o Check blood sugar twice daily, document, and provide at future appointments o Contact provider with any episodes of hypoglycemia o Provide required portion of patient assistance documentation if needed.  Medication management . Pharmacist Clinical Goal(s): o Over the next 60 days, patient will work with PharmD and  providers to achieve optimal medication adherence . Current pharmacy: [facility] .  Marland Kitchen Interventions o Comprehensive medication review performed. o Continue current medication management strategy . Patient self care activities - Over the next 60 days, patient will: o Take medications as prescribed o Report any questions or  concerns to PharmD and/or provider(s)  Initial goal documentation        Angela Rose was given information about Chronic Care Management services today including:  1. CCM service includes personalized support from designated clinical staff supervised by her physician, including individualized plan of care and coordination with other care providers 2. 24/7 contact phone numbers for assistance for urgent and routine care needs. 3. Standard insurance, coinsurance, copays and deductibles apply for chronic care management only during months in which we provide at least 20 minutes of these services. Most insurances cover these services at 100%, however patients may be responsible for any copay, coinsurance and/or deductible if applicable. This service may help you avoid the need for more expensive face-to-face services. 4. Only one practitioner may furnish and bill the service in a calendar month. 5. The patient may stop CCM services at any time (effective at the end of the month) by phone call to the office staff.  Patient agreed to services and verbal consent obtained.   The patient verbalized understanding of instructions provided today and agreed to receive a mailed copy of patient instruction and/or educational materials. Telephone follow up appointment with pharmacy team member scheduled for: 2 months  Angela Rose. Angela Rose PharmD, BCPS Clinical Pharmacist [phone]  DASH Eating Plan DASH stands for "Dietary Approaches to Stop Hypertension." The DASH eating plan is a healthy eating plan that has been shown to reduce high blood pressure (hypertension). It may also reduce your risk for type 2 diabetes, heart disease, and stroke. The DASH eating plan may also help with weight loss. What are tips for following this plan?  General guidelines  Avoid eating more than 2,300 mg (milligrams) of salt (sodium) a day. If you have hypertension, you may need to reduce your sodium intake to 1,500 mg a  day.  Limit alcohol intake to no more than 1 drink a day for nonpregnant women and 2 drinks a day for men. One drink equals 12 oz of beer, 5 oz of wine, or 1 oz of hard liquor.  Work with your health care provider to maintain a healthy body weight or to lose weight. Ask what an ideal weight is for you.  Get at least 30 minutes of exercise that causes your heart to beat faster (aerobic exercise) most days of the week. Activities may include walking, swimming, or biking.  Work with your health care provider or diet and nutrition specialist (dietitian) to adjust your eating plan to your individual calorie needs. Reading food labels   Check food labels for the amount of sodium per serving. Choose foods with less than 5 percent of the Daily Value of sodium. Generally, foods with less than 300 mg of sodium per serving fit into this eating plan.  To find whole grains, look for the word "whole" as the first word in the ingredient list. Shopping  Buy products labeled as "low-sodium" or "no salt added."  Buy fresh foods. Avoid canned foods and premade or frozen meals. Cooking  Avoid adding salt when cooking. Use salt-free seasonings or herbs instead of table salt or sea salt. Check with your health care provider or pharmacist before using salt substitutes.  Do not  fry foods. Cook foods using healthy methods such as baking, boiling, grilling, and broiling instead.  Cook with heart-healthy oils, such as olive, canola, soybean, or sunflower oil. Meal planning  Eat a balanced diet that includes: ? 5 or more servings of fruits and vegetables each day. At each meal, try to fill half of your plate with fruits and vegetables. ? Up to 6-8 servings of whole grains each day. ? Less than 6 oz of lean meat, poultry, or fish each day. A 3-oz serving of meat is about the same size as a deck of cards. One egg equals 1 oz. ? 2 servings of low-fat dairy each day. ? A serving of nuts, seeds, or beans 5 times  each week. ? Heart-healthy fats. Healthy fats called Omega-3 fatty acids are found in foods such as flaxseeds and coldwater fish, like sardines, salmon, and mackerel.  Limit how much you eat of the following: ? Canned or prepackaged foods. ? Food that is high in trans fat, such as fried foods. ? Food that is high in saturated fat, such as fatty meat. ? Sweets, desserts, sugary drinks, and other foods with added sugar. ? Full-fat dairy products.  Do not salt foods before eating.  Try to eat at least 2 vegetarian meals each week.  Eat more home-cooked food and less restaurant, buffet, and fast food.  When eating at a restaurant, ask that your food be prepared with less salt or no salt, if possible. What foods are recommended? The items listed may not be a complete list. Talk with your dietitian about what dietary choices are best for you. Grains Whole-grain or whole-wheat bread. Whole-grain or whole-wheat pasta. Brown rice. [place]. Bulgur. Whole-grain and low-sodium cereals. Pita bread. Low-fat, low-sodium crackers. Whole-wheat flour tortillas. Vegetables Fresh or frozen vegetables (raw, steamed, roasted, or grilled). Low-sodium or reduced-sodium tomato and vegetable juice. Low-sodium or reduced-sodium tomato sauce and tomato paste. Low-sodium or reduced-sodium canned vegetables. Fruits All fresh, dried, or frozen fruit. Canned fruit in natural juice (without added sugar). Meat and other protein foods Skinless chicken or [place]. Ground chicken or [place]. Pork with fat trimmed off. Fish and seafood. Egg whites. Dried beans, peas, or lentils. Unsalted nuts, nut butters, and seeds. Unsalted canned beans. Lean cuts of beef with fat trimmed off. Low-sodium, lean deli meat. Dairy Low-fat (1%) or fat-free (skim) milk. Fat-free, low-fat, or reduced-fat cheeses. Nonfat, low-sodium ricotta or cottage cheese. Low-fat or nonfat yogurt. Low-fat, low-sodium cheese. Fats and oils Soft margarine  without trans fats. Vegetable oil. Low-fat, reduced-fat, or light mayonnaise and salad dressings (reduced-sodium). Canola, safflower, olive, soybean, and sunflower oils. Avocado. Seasoning and other foods Herbs. Spices. Seasoning mixes without salt. Unsalted popcorn and pretzels. Fat-free sweets. What foods are not recommended? The items listed may not be a complete list. Talk with your dietitian about what dietary choices are best for you. Grains Baked goods made with fat, such as croissants, muffins, or some breads. Dry pasta or rice meal packs. Vegetables Creamed or fried vegetables. Vegetables in a cheese sauce. Regular canned vegetables (not low-sodium or reduced-sodium). Regular canned tomato sauce and paste (not low-sodium or reduced-sodium). Regular tomato and vegetable juice (not low-sodium or reduced-sodium). Angie Fava. Olives. Fruits Canned fruit in a light or heavy syrup. Fried fruit. Fruit in cream or butter sauce. Meat and other protein foods Fatty cuts of meat. Ribs. Fried meat. Berniece Salines. Sausage. Bologna and other processed lunch meats. Salami. Fatback. Hotdogs. Bratwurst. Salted nuts and seeds. Canned beans with added salt.  Canned or smoked fish. Whole eggs or egg yolks. Chicken or [place] with skin. Dairy Whole or 2% milk, cream, and half-and-half. Whole or full-fat cream cheese. Whole-fat or sweetened yogurt. Full-fat cheese. Nondairy creamers. Whipped toppings. Processed cheese and cheese spreads. Fats and oils Butter. Stick margarine. Lard. Shortening. Ghee. Bacon fat. Tropical oils, such as coconut, palm kernel, or palm oil. Seasoning and other foods Salted popcorn and pretzels. Onion salt, garlic salt, seasoned salt, table salt, and sea salt. Worcestershire sauce. Tartar sauce. Barbecue sauce. Teriyaki sauce. Soy sauce, including reduced-sodium. Steak sauce. Canned and packaged gravies. Fish sauce. Oyster sauce. Cocktail sauce. Horseradish that you find on the shelf. Ketchup.  Mustard. Meat flavorings and tenderizers. Bouillon cubes. Hot sauce and Tabasco sauce. Premade or packaged marinades. Premade or packaged taco seasonings. Relishes. Regular salad dressings. Where to find more information:  National Heart, Lung, and [place]: https://wilson-eaton.com/  American Heart Association: www.heart.org Summary  The DASH eating plan is a healthy eating plan that has been shown to reduce high blood pressure (hypertension). It may also reduce your risk for type 2 diabetes, heart disease, and stroke.  With the DASH eating plan, you should limit salt (sodium) intake to 2,300 mg a day. If you have hypertension, you may need to reduce your sodium intake to 1,500 mg a day.  When on the DASH eating plan, aim to eat more fresh fruits and vegetables, whole grains, lean proteins, low-fat dairy, and heart-healthy fats.  Work with your health care provider or diet and nutrition specialist (dietitian) to adjust your eating plan to your individual calorie needs. This information is not intended to replace advice given to you by your health care provider. Make sure you discuss any questions you have with your health care provider. Document Revised: 09/09/2017 Document Reviewed: 09/20/2016 Elsevier Patient Education  2020 [employer].

## 2020-06-29 ENCOUNTER — Other Ambulatory Visit: Payer: Self-pay | Admitting: Internal Medicine

## 2020-06-29 DIAGNOSIS — E113299 Type 2 diabetes mellitus with mild nonproliferative diabetic retinopathy without macular edema, unspecified eye: Secondary | ICD-10-CM

## 2020-07-02 ENCOUNTER — Telehealth: Payer: Self-pay

## 2020-07-02 NOTE — Telephone Encounter (Signed)
    MA9/22/2021 1st Attempt  Name: Yemaya Barnier   MRN: [id number]   DOB: 06/10/1946   AGE: 74 y.o.   GENDER: female   PCP Glean Hess, MD.   07/02/20 Spoke with patient's spouse she was asleep left message for her to return my call regarding dental and counseling resources.    Carin Shipp, AAS Paralegal, [place] . Embedded Care Coordination [facility]  Care Management  [address], [place] [address] [email or web address]  [phone]   [email or web address]

## 2020-07-03 ENCOUNTER — Telehealth: Payer: Self-pay

## 2020-07-03 NOTE — Telephone Encounter (Signed)
    MA9/23/2021 1st Attempt  Name: Angela Rose   MRN: [id number]   DOB: 1946/09/11   AGE: 74 y.o.   GENDER: female   PCP Glean Hess, MD.   07/03/20  Spoke with patient's spouse Angela Rose on [facility] 11/02/19) gave information for dental and counseling resources.  Will follow-up with patient over the next few days.    Darlin Stenseth, AAS Paralegal, [place] . Embedded Care Coordination [facility]  Care Management  [address], [place] [address] [email or web address]  [phone]   [email or web address]

## 2020-07-07 ENCOUNTER — Telehealth: Payer: Self-pay

## 2020-07-07 NOTE — Telephone Encounter (Signed)
    MA9/27/2021 1st Attempt  Name: Angela Rose   MRN: [id number]   DOB: 14-Mar-1946   AGE: 74 y.o.   GENDER: female   PCP Glean Hess, MD.   07/07/20 Left message on voicemail for patient to return my call regarding dental and counseling resources. Will call again later this week.    Marta Bouie, AAS Paralegal, [place] . Embedded Care Coordination [facility]  Care Management  [address], [place] [address] [email or web address]  [phone]   [email or web address]

## 2020-07-09 ENCOUNTER — Telehealth: Payer: Self-pay

## 2020-07-09 NOTE — Telephone Encounter (Signed)
    MA9/29/2021   Name: Angela Rose   MRN: [id number]   DOB: January 10, 1946   AGE: 74 y.o.   GENDER: female   PCP Glean Hess, MD.    07/09/20 Spoke with patient about dental resources. [employer] has sent her a list of oral surgeons and she is calling them to see which one will work for her. She is also planning to contact the mental health resources given. No other resources are needed at this time. Closing referral.   Damien Batty, AAS Paralegal, [place] . Embedded Care Coordination [facility]  Care Management  [address], [place] [address] [email or web address]  [phone]   [email or web address]

## 2020-07-23 DIAGNOSIS — I10 Essential (primary) hypertension: Secondary | ICD-10-CM | POA: Diagnosis not present

## 2020-07-23 DIAGNOSIS — I25810 Atherosclerosis of coronary artery bypass graft(s) without angina pectoris: Secondary | ICD-10-CM | POA: Diagnosis not present

## 2020-07-23 DIAGNOSIS — I6523 Occlusion and stenosis of bilateral carotid arteries: Secondary | ICD-10-CM | POA: Diagnosis not present

## 2020-07-23 DIAGNOSIS — E782 Mixed hyperlipidemia: Secondary | ICD-10-CM | POA: Diagnosis not present

## 2020-07-23 DIAGNOSIS — M791 Myalgia, unspecified site: Secondary | ICD-10-CM | POA: Diagnosis not present

## 2020-07-24 DIAGNOSIS — I10 Essential (primary) hypertension: Secondary | ICD-10-CM | POA: Diagnosis not present

## 2020-07-24 DIAGNOSIS — E782 Mixed hyperlipidemia: Secondary | ICD-10-CM | POA: Diagnosis not present

## 2020-07-24 DIAGNOSIS — M791 Myalgia, unspecified site: Secondary | ICD-10-CM | POA: Diagnosis not present

## 2020-07-24 DIAGNOSIS — I25810 Atherosclerosis of coronary artery bypass graft(s) without angina pectoris: Secondary | ICD-10-CM | POA: Diagnosis not present

## 2020-07-24 LAB — LIPID PANEL
Cholesterol: 182 (ref 0–200)
HDL: 46 (ref 35–70)
LDL Cholesterol: 110
Triglycerides: 129 (ref 40–160)

## 2020-07-24 LAB — HEPATIC FUNCTION PANEL
ALT: 8 (ref 7–35)
AST: 16 (ref 13–35)
Alkaline Phosphatase: 82 (ref 25–125)
Bilirubin, Total: 0.4

## 2020-07-29 ENCOUNTER — Other Ambulatory Visit: Payer: Self-pay | Admitting: Internal Medicine

## 2020-07-29 DIAGNOSIS — E113299 Type 2 diabetes mellitus with mild nonproliferative diabetic retinopathy without macular edema, unspecified eye: Secondary | ICD-10-CM

## 2020-08-08 ENCOUNTER — Telehealth: Payer: Self-pay | Admitting: Internal Medicine

## 2020-08-08 ENCOUNTER — Other Ambulatory Visit: Payer: Self-pay

## 2020-08-08 DIAGNOSIS — E113299 Type 2 diabetes mellitus with mild nonproliferative diabetic retinopathy without macular edema, unspecified eye: Secondary | ICD-10-CM

## 2020-08-08 MED ORDER — LEVEMIR FLEXTOUCH 100 UNIT/ML ~~LOC~~ SOPN: 25.0000 [IU] | PEN_INJECTOR | Freq: Two times a day (BID) | SUBCUTANEOUS | 1 refills | Status: DC

## 2020-08-08 MED ORDER — LEVEMIR FLEXTOUCH 100 UNIT/ML ~~LOC~~ SOPN: 25.0000 [IU] | PEN_INJECTOR | Freq: Two times a day (BID) | SUBCUTANEOUS | 0 refills | Status: DC

## 2020-08-08 NOTE — Telephone Encounter (Signed)
Patient is calling regarding her script for levemir that was denied. Patient had CPE in March. And has upcoming appt in November. Please advise Cb- [phone] [facility]erred [facility] Patient has been out of medication for 2 days. Short term dose to be sent to [facility]

## 2020-08-12 LAB — HEMOGLOBIN A1C: Hemoglobin A1C: 7.7

## 2020-08-20 ENCOUNTER — Encounter: Payer: Self-pay | Admitting: Internal Medicine

## 2020-08-20 ENCOUNTER — Ambulatory Visit (INDEPENDENT_AMBULATORY_CARE_PROVIDER_SITE_OTHER): Payer: Medicare Other | Admitting: Internal Medicine

## 2020-08-20 ENCOUNTER — Other Ambulatory Visit: Payer: Self-pay

## 2020-08-20 VITALS — BP 128/70 | HR 74 | Ht 66.0 in | Wt 169.0 lb

## 2020-08-20 DIAGNOSIS — I10 Essential (primary) hypertension: Secondary | ICD-10-CM | POA: Diagnosis not present

## 2020-08-20 DIAGNOSIS — E118 Type 2 diabetes mellitus with unspecified complications: Secondary | ICD-10-CM | POA: Diagnosis not present

## 2020-08-20 DIAGNOSIS — Z23 Encounter for immunization: Secondary | ICD-10-CM | POA: Diagnosis not present

## 2020-08-20 DIAGNOSIS — E113299 Type 2 diabetes mellitus with mild nonproliferative diabetic retinopathy without macular edema, unspecified eye: Secondary | ICD-10-CM

## 2020-08-20 DIAGNOSIS — E1169 Type 2 diabetes mellitus with other specified complication: Secondary | ICD-10-CM | POA: Diagnosis not present

## 2020-08-20 DIAGNOSIS — E785 Hyperlipidemia, unspecified: Secondary | ICD-10-CM

## 2020-08-20 MED ORDER — METFORMIN HCL ER 500 MG PO TB24: 1000.0000 mg | ORAL_TABLET | Freq: Two times a day (BID) | ORAL | 3 refills | Status: DC

## 2020-08-20 MED ORDER — ROSUVASTATIN CALCIUM 5 MG PO TABS: 5.0000 mg | ORAL_TABLET | Freq: Every day | ORAL | 1 refills | Status: DC

## 2020-08-20 MED ORDER — LEVEMIR FLEXTOUCH 100 UNIT/ML ~~LOC~~ SOPN: 30.0000 [IU] | PEN_INJECTOR | Freq: Two times a day (BID) | SUBCUTANEOUS | 3 refills | Status: DC

## 2020-08-20 NOTE — Progress Notes (Signed)
Date:  08/20/2020   Name:  Angela Rose   DOB:  Sep 12, 1946   MRN:  [id number]   Chief Complaint: Diabetes (Follow up- having blood sugar drops into the 60s and 50s and she does not feel well when this happens. She does not know what to do with her insulin units when this happens. Tried lowering her dose at night and this helps sometimes.) and Hypertension (Follow up)  Diabetes She presents for her follow-up diabetic visit. She has type 2 diabetes mellitus. Her disease course has been stable. Hypoglycemia symptoms include nervousness/anxiousness and sweats. Pertinent negatives for hypoglycemia include no headaches or tremors. Pertinent negatives for diabetes include no chest pain, no fatigue, no polydipsia and no polyuria. There are no hypoglycemic complications. Symptoms are stable. Current diabetic treatments: metformin, insulin, glimepiride and a study med. She is compliant with treatment all of the time.  Hypertension This is a chronic problem. The problem is controlled. Associated symptoms include sweats. Pertinent negatives include no chest pain, headaches, palpitations or shortness of breath. Past treatments include ACE inhibitors and calcium channel blockers. The current treatment provides significant improvement. Hypertensive end-organ damage includes CAD/MI.  Hyperlipidemia This is a chronic problem. The problem is uncontrolled. Pertinent negatives include no chest pain or shortness of breath. Current antihyperlipidemic treatment includes statins (cardiology changed atorvastatin to lovestatin but she wants to try Crestor so she can eat grapefruit).    Lab Results  Component Value Date   CREATININE 1.24 (H) 05/02/2020   BUN 30 (H) 05/02/2020   NA 138 05/02/2020   K 4.4 05/02/2020   CL 101 05/02/2020   CO2 22 05/02/2020   Lab Results  Component Value Date   CHOL 182 07/24/2020   HDL 46 07/24/2020   LDLCALC 110 07/24/2020   TRIG 129 07/24/2020   CHOLHDL 3.7 12/25/2019     Lab Results  Component Value Date   TSH 1.850 12/25/2019   Lab Results  Component Value Date   HGBA1C 7.7 08/12/2020   Lab Results  Component Value Date   WBC 6.1 12/25/2019   HGB 12.0 12/25/2019   HCT 37.4 12/25/2019   MCV 88 12/25/2019   PLT 292 12/25/2019   Lab Results  Component Value Date   ALT 8 07/24/2020   AST 16 07/24/2020   ALKPHOS 82 07/24/2020   BILITOT 0.4 05/02/2020     Review of Systems  Constitutional: Negative for appetite change, fatigue, fever and unexpected weight change.  HENT: Negative for tinnitus and trouble swallowing.   Eyes: Negative for visual disturbance.  Respiratory: Negative for cough, chest tightness and shortness of breath.   Cardiovascular: Negative for chest pain, palpitations and leg swelling.  Gastrointestinal: Negative for abdominal pain.  Endocrine: Negative for polydipsia and polyuria.  Genitourinary: Negative for dysuria and hematuria.  Musculoskeletal: Negative for arthralgias.  Neurological: Negative for tremors, numbness and headaches.  Psychiatric/Behavioral: Negative for dysphoric mood. The patient is nervous/anxious.     Patient Active Problem List   Diagnosis Date Noted  . Atrial fibrillation ([place]) 12/25/2018  . Cataract, nuclear sclerotic, both eyes 11/08/2018  . OAB (overactive bladder) 08/11/2018  . Tinnitus aurium, bilateral 08/11/2018  . Renal cyst, right 05/01/2018  . 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  . Other tear of medial meniscus, current injury, left knee, initial encounter 12/31/2015  . Type II diabetes mellitus with complication ([place]) 74/05/1447  . Hyperlipidemia associated with type 2 diabetes mellitus ([place]) 08/14/2015  . Coronary artery disease involving native coronary artery of  native heart without angina pectoris 08/14/2015  . Basal cell carcinoma 08/11/2015  . History of melanoma 08/11/2015  . Major depression in partial remission ([place]) 08/08/2015  . Non-proliferative diabetic retinopathy, both  eyes ([place]) 08/08/2015  . History of colon polyps 08/08/2015  . Hypothyroidism, postablative 08/08/2015  . Carotid artery plaque 09/03/2014  . Essential (primary) hypertension 09/03/2014  . APC (atrial premature contractions) 09/03/2014    Allergies  Allergen Reactions  . Propoxyphene Nausea And Vomiting    Other reaction(s): Vomiting  . Lidocaine-Epinephrine     Other reaction(s): Other (See Comments) palpitatins and chest pain  . Other Other (See Comments)    sutures - in her hand-very irritated  . Tape Rash    BANDAIDS. BANDAIDS  . Xylocaine  [Lidocaine Hcl]     Other reaction(s): Other (See Comments) palpitatins and chest pain  . Jardiance [Empagliflozin] Other (See Comments)    Recurrent vaginal infections    Past Surgical History:  Procedure Laterality Date  . ABDOMINAL HYSTERECTOMY    . APPENDECTOMY    . BASAL CELL CARCINOMA EXCISION    . BREAST BIOPSY Right    needle bx-neg  . CATARACT EXTRACTION, BILATERAL Bilateral 11/23/2018  . CORONARY ARTERY BYPASS GRAFT  1996   x 3  . MELANOMA EXCISION    . PARTIAL HYSTERECTOMY  1976   ovaries remain    Social History   Tobacco Use  . Smoking status: Former Smoker    Quit date: 06/11/1982    Years since quitting: 38.2  . Smokeless tobacco: Never Used  . Tobacco comment: smoking cessation materials not required  Vaping Use  . Vaping Use: Never used  Substance Use Topics  . Alcohol use: Yes    Alcohol/week: 0.0 standard drinks    Comment: rare 1 wine glass of wine  . Drug use: No     Medication list has been reviewed and updated.  Current Meds  Medication Sig  . amLODipine (NORVASC) 2.5 MG tablet TAKE 1 TABLET BY MOUTH  DAILY (Patient taking differently: every evening. )  . aspirin 81 MG tablet Take by mouth every evening.   . BD PEN NEEDLE NANO U/F 32G X 4 MM MISC USE 1  TWICE DAILY  . Cyanocobalamin 5000 MCG LOZG Take 1 tablet by mouth daily.  . enalapril (VASOTEC) 5 MG tablet TAKE 1 TABLET BY MOUTH  TWICE  DAILY  . FLUoxetine (PROZAC) 40 MG capsule TAKE 1 CAPSULE BY MOUTH  DAILY  . glimepiride (AMARYL) 2 MG tablet Take 1 tablet (2 mg total) by mouth daily.  Marland Kitchen glucose blood (ONE TOUCH ULTRA TEST) test strip Frequency:PHARMDIR   Dosage:0.0     Instructions:  Note:testing 3-4xqd, DX Code 250.00 Dose: 1  . hydrochlorothiazide (HYDRODIURIL) 25 MG tablet TAKE 1 TABLET BY MOUTH  DAILY  . insulin detemir (LEVEMIR FLEXTOUCH) 100 UNIT/ML FlexPen Inject 25 Units into the skin 2 (two) times daily. (Patient taking differently: Inject 30 Units into the skin 2 (two) times daily. )  . Investigational - Study Medication Study name: Semaglutide 3 mg or placebo tablets - 7 tablets  . levothyroxine (SYNTHROID) 50 MCG tablet TAKE 1 TABLET BY MOUTH  DAILY BEFORE BREAKFAST  . lovastatin (MEVACOR) 20 MG tablet Take by mouth.  . meloxicam (MOBIC) 15 MG tablet Take 1 tablet (15 mg total) by mouth daily. (Patient taking differently: Take 15 mg by mouth as needed. )  . metFORMIN (GLUCOPHAGE-XR) 500 MG 24 hr tablet Take 2 tablets by mouth twice daily  PHQ 2/9 Scores 08/20/2020 05/02/2020 12/25/2019 10/15/2019  PHQ - 2 Score 2 2 2 6   PHQ- 9 Score 2 3 3 8     GAD 7 : Generalized Anxiety Score 08/20/2020 05/02/2020  Nervous, Anxious, on Edge 1 0  Control/stop worrying 1 0  Worry too much - different things 1 0  Trouble relaxing 0 0  Restless 0 0  Easily annoyed or irritable 0 0  Afraid - awful might happen 0 0  Total GAD 7 Score 3 0  Anxiety Difficulty Not difficult at all Not difficult at all    BP Readings from Last 3 Encounters:  08/20/20 128/70  05/02/20 (!) 130/56  12/25/19 136/72    Physical Exam Vitals and nursing note reviewed.  Constitutional:      General: She is not in acute distress.    Appearance: Normal appearance. She is well-developed.  HENT:     Head: Normocephalic and atraumatic.  Neck:     Vascular: No carotid bruit.  Cardiovascular:     Rate and Rhythm: Normal rate and regular rhythm.      Pulses: Normal pulses.     Heart sounds: No murmur heard.   Pulmonary:     Effort: Pulmonary effort is normal. No respiratory distress.     Breath sounds: No wheezing or rhonchi.  Musculoskeletal:     Cervical back: Normal range of motion.     Right lower leg: No edema.     Left lower leg: No edema.  Lymphadenopathy:     Cervical: No cervical adenopathy.  Skin:    General: Skin is warm and dry.     Capillary Refill: Capillary refill takes less than 2 seconds.     Findings: No rash.  Neurological:     General: No focal deficit present.     Mental Status: She is alert and oriented to person, place, and time.  Psychiatric:        Mood and Affect: Mood normal.     Wt Readings from Last 3 Encounters:  08/20/20 169 lb (76.7 kg)  05/02/20 171 lb (77.6 kg)  12/25/19 176 lb (79.8 kg)    BP 128/70   Pulse 74   Ht 5\' 6"  (1.676 m)   Wt 169 lb (76.7 kg)   SpO2 96%   BMI 27.28 kg/m   Assessment and Plan: 1. Essential (primary) hypertension Clinically stable exam with well controlled BP. Tolerating medications without side effects at this time. Pt to continue current regimen and low sodium diet; benefits of regular exercise as able discussed.  2. Type II diabetes mellitus with complication ([place]) Clinically stable by exam and report without s/s of hypoglycemia. DM complicated by HTN. Episodes of mild hypoglycemia treated with food. Recommend reducing the evening dose of Levemir to 20 units  3. Hyperlipidemia associated with type 2 diabetes mellitus ([place]) Now on Lovestatin to see if her myalgia and fatigue improve but wants to try Crestor instead.  4. Need for immunization against influenza - Flu Vaccine QUAD High Dose(Fluad)   Partially dictated using Editor, commissioning. Any errors are unintentional.  Halina Maidens, MD [facility]  08/20/2020

## 2020-08-20 NOTE — Patient Instructions (Signed)
Try reducing the evening insulin dose to 20 units every evening and see if your blood sugars level out.

## 2020-08-21 ENCOUNTER — Ambulatory Visit: Payer: Self-pay | Admitting: Pharmacist

## 2020-08-21 DIAGNOSIS — E113299 Type 2 diabetes mellitus with mild nonproliferative diabetic retinopathy without macular edema, unspecified eye: Secondary | ICD-10-CM

## 2020-08-21 DIAGNOSIS — E785 Hyperlipidemia, unspecified: Secondary | ICD-10-CM

## 2020-08-21 DIAGNOSIS — E1169 Type 2 diabetes mellitus with other specified complication: Secondary | ICD-10-CM

## 2020-08-21 DIAGNOSIS — I10 Essential (primary) hypertension: Secondary | ICD-10-CM

## 2020-08-21 NOTE — Patient Instructions (Addendum)
Visit Information  It was a pleasure speaking with you today. Thank you for letting me be part of your clinical team. Please call with any questions or concerns.   Goals Addressed            This Visit's Progress   . PharmD " I want to be healthy"       CARE PLAN ENTRY (see longitudinal plan of care for additional care plan information)  Current Barriers:  . Chronic Disease Management support, education, and care coordination needs related to Hypertension, Hyperlipidemia, and Diabetes   Hypertension BP Readings from Last 3 Encounters:  08/20/20 128/70  05/02/20 (!) 130/56  12/25/19 136/72   . Pharmacist Clinical Goal(s): o Over the next 60 days, patient will work with PharmD and providers to maintain BP goal <130/80 . Current regimen:  . Enalapril 739m bid . Amlodipine 2.547m qd . Hydrochlorothiazide 25 mg qd . Interventions: . Comprehensive medication review performed, medication list updated in electronic medical record . Inter-disciplinary care team collaboration (see longitudinal plan of care) o Provided diet and exercise counseling. . Patient self care activities - Over the next 60 days, patient will: o Ensure daily salt intake < 2300 mg/day o Increase exercise with goal of 30 min/day 5 days/week  Hyperlipidemia Lab Results  Component Value Date/Time   LDLCALC 110 07/24/2020 12:00 AM   LDLCALC 103 (H) 12/25/2019 11:08 AM   . Pharmacist Clinical Goal(s): o Over the next 60 days, patient will work with PharmD and providers to achieve LDL goal < 70 . Current regimen:  . Lovastatin 20 mg qd .  ASA 81 mg qd . Interventions: . Comprehensive medication review performed, medication list updated in electronic medical record . Inter-disciplinary care team collaboration (see longitudinal plan of care) o Will discuss increasing atorvastatin dose to 40 mg with PCP . Patient self care activities - Over the next 60 days, patient will: o Take medications as  prescribed o Increase exercise with goal of 30 min/day 5 days/week o Focus on DASH diet  Diabetes Lab Results  Component Value Date/Time   HGBA1C 7.7 08/12/2020 12:00 AM   HGBA1C 8.7 (H) 05/02/2020 10:17 AM   HGBA1C 8.9 05/01/2020 12:00 AM   . Pharmacist Clinical Goal(s): o Over the next 60 days, patient will work with PharmD and providers to achieve A1c goal <7% . Current regimen:   Insulin detemir (Levemir) 25-30 u qam, 20 units qpm  Metformin er 500 mg 2 tabs bid  SOUL study drug [id number] tabs qd (semaglutide oral vs. placebo . Interventions: . Comprehensive medication review performed, medication list updated in electronic medical record . Inter-disciplinary care team collaboration (see longitudinal plan of care) o Reviewed goal glucose readings for an A1c of <7%, we want to see fasting sugars <130 and 2 hour after meal sugars <180.  o Reviewed 15/15 rule for treating hypoglycemia . Patient self care activities - Over the next 60 days, patient will: o Check blood sugar twice daily, document, and provide at future appointments o Contact provider with any episodes of hypoglycemia o Provide required portion of patient assistance documentation if needed.  Medication management . Pharmacist Clinical Goal(s): o Over the next 60 days, patient will work with PharmD and providers to achieve optimal medication adherence . Current pharmacy: [facility] .  . IMarland Kitchenterventions o Comprehensive medication review performed. o Continue current medication management strategy . Patient self care activities - Over the next 60 days, patient will: o Take medications  as prescribed o Report any questions or concerns to PharmD and/or provider(s)  Initial goal documentation        The patient verbalized understanding of instructions provided today and agreed to receive a mailed copy of patient instruction and/or educational materials.  Telephone follow up appointment with  pharmacy team member scheduled for: 3 months  Junita Push. [place] PharmD, BCPS Clinical Pharmacist [phone]  Preventing Hypoglycemia Hypoglycemia occurs when the level of sugar (glucose) in the blood is too low. Hypoglycemia can happen in people who do or do not have diabetes (diabetes mellitus). It can develop quickly, and it can be a medical emergency. For most people with diabetes, a blood glucose level below 70 mg/dL (3.9 mmol/L) is considered hypoglycemia. Glucose is a type of sugar that provides the body's main source of energy. Certain hormones (insulin and glucagon) control the level of glucose in the blood. Insulin lowers blood glucose, and glucagon increases blood glucose. Hypoglycemia can result from having too much insulin in the bloodstream, or from not eating enough food that contains glucose. Your risk for hypoglycemia is higher:  If you take insulin or diabetes medicines to help lower your blood glucose or help your body make more insulin.  If you skip or delay a meal or snack.  If you are ill.  During and after exercise. You can prevent hypoglycemia by working with your health care provider to adjust your meal plan as needed and by taking other precautions. How can hypoglycemia affect me? Mild symptoms Mild hypoglycemia may not cause any symptoms. If you do have symptoms, they may include:  Hunger.  Anxiety.  Sweating and feeling clammy.  Dizziness or feeling light-headed.  Sleepiness.  Nausea.  Increased heart rate.  Headache.  Blurry vision.  Irritability.  Tingling or numbness around the mouth, lips, or tongue.  A change in coordination.  Restless sleep. If mild hypoglycemia is not recognized and treated, it can quickly become moderate or severe hypoglycemia. Moderate symptoms Moderate hypoglycemia can cause:  Mental confusion and poor judgment.  Behavior changes.  Weakness.  Irregular heartbeat. Severe symptoms Severe hypoglycemia is a  medical emergency. It can cause:  Fainting.  Seizures.  Loss of consciousness (coma).  Death. What nutrition changes can be made?  Work with your health care provider or diet and nutrition specialist (dietitian) to make a healthy meal plan that is right for you. Follow your meal plan carefully.  Eat meals at regular times.  If recommended by your health care provider, have snacks between meals.  Donot skip or delay meals or snacks. You can be at risk for hypoglycemia if you are not getting enough carbohydrates. What lifestyle changes can be made?   Work closely with your health care provider to manage your blood glucose. Make sure you know: ? Your goal blood glucose levels. ? How and when to check your blood glucose. ? The symptoms of hypoglycemia. It is important to treat it right away to keep it from becoming severe.  Do not drink alcohol on an empty stomach.  When you are ill, check your blood glucose more often than usual. Follow your sick day plan whenever you cannot eat or drink normally. Make this plan in advance with your health care provider.  Always check your blood glucose before, during, and after exercise. How is this treated? This condition can often be treated by immediately eating or drinking something that contains sugar, such as:  Fruit juice, 4-6 oz (120-150 mL).  Regular (not diet)  soda, 4-6 oz (120-150 mL).  Low-fat milk, 4 oz (120 mL).  Several pieces of hard candy.  Sugar or honey, 1 Tbsp (15 mL). Treating hypoglycemia if you have diabetes If you are alert and able to swallow safely, follow the 15:15 rule:  Take 15 grams of a rapid-acting carbohydrate. Talk with your health care provider about how much you should take.  Rapid-acting options include: ? Glucose pills (take 15 grams). ? 6-8 pieces of hard candy. ? 4-6 oz (120-150 mL) of fruit juice. ? 4-6 oz (120-150 mL) of regular (not diet) soda.  Check your blood glucose 15 minutes after  you take the carbohydrate.  If the repeat blood glucose level is still at or below 70 mg/dL (3.9 mmol/L), take 15 grams of a carbohydrate again.  If your blood glucose level does not increase above 70 mg/dL (3.9 mmol/L) after 3 tries, seek emergency medical care.  After your blood glucose level returns to normal, eat a meal or a snack within 1 hour. Treating severe hypoglycemia Severe hypoglycemia is when your blood glucose level is at or below 54 mg/dL (3 mmol/L). Severe hypoglycemia is a medical emergency. Get medical help right away. If you have severe hypoglycemia and you cannot eat or drink, you may need an injection of glucagon. A family member or close friend should learn how to check your blood glucose and how to give you a glucagon injection. Ask your health care provider if you need to have an emergency glucagon injection kit available. Severe hypoglycemia may need to be treated in a hospital. The treatment may include getting glucose through an IV. You may also need treatment for the cause of your hypoglycemia. Where to find more information  American Diabetes Association: www.diabetes.[employer] of Diabetes and Digestive and Kidney Diseases: [employer] Contact a health care provider if:  You have problems keeping your blood glucose in your target range.  You have frequent episodes of hypoglycemia. Get help right away if:  You continue to have hypoglycemia symptoms after eating or drinking something containing glucose.  Your blood glucose level is at or below 54 mg/dL (3 mmol/L).  You faint.  You have a seizure. These symptoms may represent a serious problem that is an emergency. Do not wait to see if the symptoms will go away. Get medical help right away. Call your local emergency services (911 in the [place] Summary  Know the symptoms of hypoglycemia, and when you are at risk for it (such as during exercise or when you are sick). Check your blood  glucose often when you are at risk for hypoglycemia.  Hypoglycemia can develop quickly, and it can be dangerous if it is not treated right away. If you have a history of severe hypoglycemia, make sure you know how to use your glucagon injection kit.  Make sure you know how to treat hypoglycemia. Keep a carbohydrate snack available when you may be at risk for hypoglycemia. This information is not intended to replace advice given to you by your health care provider. Make sure you discuss any questions you have with your health care provider. Document Revised: 01/19/2019 Document Reviewed: 05/25/2017 Elsevier Patient Education  [place].

## 2020-08-21 NOTE — Chronic Care Management (AMB) (Signed)
Chronic Care Management Pharmacy  Name: Angela Rose  MRN: [id number] DOB: 06/04/1946   Chief Complaint/ HPI  Angela Rose,  74 y.o. , female presents for their Initial CCM visit with the clinical pharmacist via telephone due to COVID-19 Pandemic.  PCP : Glean Hess, MD Patient Care Team: Glean Hess, MD as PCP - General (Internal Medicine) Corey Skains, MD as Consulting Physician (Cardiology) Hollice Espy, MD as Consulting Physician (Urology) Vladimir Faster, [facility] (Pharmacist)  Their chronic conditions include: Hypertension, Hyperlipidemia, Diabetes,  Coronary Artery Disease, Hypothyroidism and Depression   Office Visit 08/20/20- Drl berglund - labs 05/02/20- Dr. [name] - DM follow up, resume glimepiride 80m ac b or lunch, levemir q 12 reduce by 5 u if FBG<,90  Consult Visit: 04/30/20-[facility] study visit (Oral semaglutide CV outcomes in pts with DM2) 04/02/20- Urgent Care- Concern fo rCOVID 02/06/20- [facility] study visit- Levemir changed to 30 u bid 02/05/20-Dr. KNehemiah Massed Cards [facility]-  Allergies  Allergen Reactions  . Propoxyphene Nausea And Vomiting    Other reaction(s): Vomiting  . Lidocaine-Epinephrine     Other reaction(s): Other (See Comments) palpitatins and chest pain  . Other Other (See Comments)    sutures - in her hand-very irritated  . Tape Rash    BANDAIDS. BANDAIDS  . Xylocaine  [Lidocaine Hcl]     Other reaction(s): Other (See Comments) palpitatins and chest pain  . Jardiance [Empagliflozin] Other (See Comments)    Recurrent vaginal infections    Medications: Outpatient Encounter Medications as of 08/21/2020  Medication Sig  . amLODipine (NORVASC) 2.5 MG tablet TAKE 1 TABLET BY MOUTH  DAILY (Patient taking differently: every evening. )  . amoxicillin (AMOXIL) 500 MG capsule Take 500 mg by mouth 3 (three) times daily. X 10 days  . aspirin 81 MG tablet Take by mouth every evening.   . BD PEN NEEDLE NANO U/F 32G X 4  MM MISC USE 1  TWICE DAILY  . Cyanocobalamin 5000 MCG LOZG Take 1 tablet by mouth daily.  . enalapril (VASOTEC) 5 MG tablet TAKE 1 TABLET BY MOUTH  TWICE DAILY  . FLUoxetine (PROZAC) 40 MG capsule TAKE 1 CAPSULE BY MOUTH  DAILY  . glucose blood (ONE TOUCH ULTRA TEST) test strip Frequency:PHARMDIR   Dosage:0.0     Instructions:  Note:testing 3-4xqd, DX Code 250.00 Dose: 1  . hydrochlorothiazide (HYDRODIURIL) 25 MG tablet TAKE 1 TABLET BY MOUTH  DAILY  . insulin detemir (LEVEMIR FLEXTOUCH) 100 UNIT/ML FlexPen Inject 30 Units into the skin 2 (two) times daily. (Patient taking differently: Inject 30 Units into the skin 2 (two) times daily. Taking 25-30 units qam, 20 units qpm)  . Investigational - Study Medication Study name: Semaglutide 3 mg or placebo tablets - 7 tablets  . levothyroxine (SYNTHROID) 50 MCG tablet TAKE 1 TABLET BY MOUTH  DAILY BEFORE BREAKFAST  . lovastatin (MEVACOR) 20 MG tablet Take by mouth.  . metFORMIN (GLUCOPHAGE-XR) 500 MG 24 hr tablet Take 2 tablets (1,000 mg total) by mouth 2 (two) times daily.  .Marland Kitchenglimepiride (AMARYL) 2 MG tablet Take 1 tablet (2 mg total) by mouth daily. (Patient not taking: Reported on 08/21/2020)  . meloxicam (MOBIC) 15 MG tablet Take 1 tablet (15 mg total) by mouth daily. (Patient not taking: Reported on 08/21/2020)  . rosuvastatin (CRESTOR) 5 MG tablet Take 1 tablet (5 mg total) by mouth daily. (Patient not taking: Reported on 08/21/2020)   No facility-administered encounter medications on file as of 08/21/2020.  Current Diagnosis/Assessment:  SDOH Interventions     Most Recent Value  SDOH Interventions  Financial Strain Interventions Other (Comment)  [provided [facility] contact for help in plan selection]      Goals Addressed            This Visit's Progress   . PharmD " I want to be healthy"       CARE PLAN ENTRY (see longitudinal plan of care for additional care plan information)  Current Barriers:  . Chronic Disease Management  support, education, and care coordination needs related to Hypertension, Hyperlipidemia, and Diabetes   Hypertension BP Readings from Last 3 Encounters:  08/20/20 128/70  05/02/20 (!) 130/56  12/25/19 136/72   . Pharmacist Clinical Goal(s): o Over the next 60 days, patient will work with PharmD and providers to maintain BP goal <130/80 . Current regimen:  . Enalapril 757m bid . Amlodipine 2.525m qd . Hydrochlorothiazide 25 mg qd . Interventions: . Comprehensive medication review performed, medication list updated in electronic medical record . Inter-disciplinary care team collaboration (see longitudinal plan of care) o Provided diet and exercise counseling. . Patient self care activities - Over the next 60 days, patient will: o Ensure daily salt intake < 2300 mg/day o Increase exercise with goal of 30 min/day 5 days/week  Hyperlipidemia Lab Results  Component Value Date/Time   LDLCALC 110 07/24/2020 12:00 AM   LDLCALC 103 (H) 12/25/2019 11:08 AM   . Pharmacist Clinical Goal(s): o Over the next 60 days, patient will work with PharmD and providers to achieve LDL goal < 70 . Current regimen:  . Lovastatin 20 mg qd .  ASA 81 mg qd . Interventions: . Comprehensive medication review performed, medication list updated in electronic medical record . Inter-disciplinary care team collaboration (see longitudinal plan of care) o Will discuss increasing atorvastatin dose to 40 mg with PCP . Patient self care activities - Over the next 60 days, patient will: o Take medications as prescribed o Increase exercise with goal of 30 min/day 5 days/week o Focus on DASH diet  Diabetes Lab Results  Component Value Date/Time   HGBA1C 7.7 08/12/2020 12:00 AM   HGBA1C 8.7 (H) 05/02/2020 10:17 AM   HGBA1C 8.9 05/01/2020 12:00 AM   . Pharmacist Clinical Goal(s): o Over the next 60 days, patient will work with PharmD and providers to achieve A1c goal <7% . Current regimen:   Insulin detemir  (Levemir) 25-30 u qam, 20 units qpm  Metformin er 500 mg 2 tabs bid  [facility] study drug 57m15m7 tabs qd (semaglutide oral vs. placebo . Interventions: . Comprehensive medication review performed, medication list updated in electronic medical record . Inter-disciplinary care team collaboration (see longitudinal plan of care) o Reviewed goal glucose readings for an A1c of <7%, we want to see fasting sugars <130 and 2 hour after meal sugars <180.  o Reviewed 15/15 rule for treating hypoglycemia . Patient self care activities - Over the next 60 days, patient will: o Check blood sugar twice daily, document, and provide at future appointments o Contact provider with any episodes of hypoglycemia o Provide required portion of patient assistance documentation if needed.  Medication management . Pharmacist Clinical Goal(s): o Over the next 60 days, patient will work with PharmD and providers to achieve optimal medication adherence . Current pharmacy: [facility] .  . IMarland Kitchenterventions o Comprehensive medication review performed. o Continue current medication management strategy . Patient self care activities - Over the next 60 days,  patient will: o Take medications as prescribed o Report any questions or concerns to PharmD and/or provider(s)  Initial goal documentation       Diabetes   A1c goal <7%  Recent Relevant Labs: Lab Results  Component Value Date/Time   HGBA1C 7.7 08/12/2020 12:00 AM   HGBA1C 8.7 (H) 05/02/2020 10:17 AM   HGBA1C 8.9 05/01/2020 12:00 AM    Last diabetic Eye exam:  Lab Results  Component Value Date/Time   HMDIABEYEEXA Retinopathy (A) 08/08/2018 12:00 AM    Last diabetic Foot exam: No results found for: HMDIABFOOTEX   Checking BG: 2x per Day  Recent FBG Readings: 134 today, 62 yesterday occasionally in the 50s Recent 2hr PP BG readings:  130-150s   Patient has failed these meds in past: Jardiance- polyuria Patient is currently uncontrolled  on the following medications:  Insulin detemir (Levemir) 25-30 u qam and 20 units qpm  Metformin er 500 mg 2 tabs bid  [facility] study drug 22m, 7 tabs qd (semaglutide oral vs. placebo  We discussed: diet and exercise extensively and how to recognize and treat signs of hypoglycemia. Patient [facility] and study is scheduled to continue for 2 more years.She is convinced she is receiving placebo. She reports frequent symptomatic hypoglycemia but has not consistently decreased her evening Levemir dose. She is not taking prandial insulin and can not tolerate SGLT2 therapy due to frequent UTI's. Evening Levemir dose decreased to 20 units by PCP yesterday. [facility]. Patient had 3 teeth extracted yesterday and states they were infected and dentist prescribed Amoxicillin 5080mtid x 10 days. She reports feeling noticably better already and is planning to have 2 other extractions and a partial eventually. We reviewed the 15/15 rule for treating hypoglycemia. She keeps glucose tabs on hand. We discussed decreasing evening dose of Levemir by 2 units every day FBG< 100.   Plan  Continue current medications. Hypertension   BMP Latest Ref Rng & Units 05/02/2020 12/25/2019 07/25/2019  Glucose 65 - 99 mg/dL 104(H) 236(H) 176(H)  BUN 8 - 27 mg/dL 30(H) 18 20  Creatinine 0.57 - 1.00 mg/dL 1.24(H) 0.99 1.18(H)  BUN/Creat Ratio 12 - _0 Sodium 134 - 144 mmol/L 138 139 137  Potassium 3.5 - 5.2 mmol/L 4.4 4.5 4.7  Chloride 96 - 106 mmol/L 101 100 98  CO2 20 - 29 mmol/L _1 Calcium 8.7 - 10.3 mg/dL 9.5 9.1 9.5    BP goal is:  <130/80 (<120 per Cards given SPRINT trial outcomes)  Office blood pressures are  BP Readings from Last 3 Encounters:  08/20/20 128/70  05/02/20 (!) 130/56  12/25/19 136/72   Patient checks BP at home does not take Patient home BP readings are ranging: unknown  Patient has  failed these meds in the past:  Patient is currently controlled on the following medications:  . Enalapril 89m89mid . Amlodipine 2.89mg6md . Hydrochlorothiazide 25 mg qd  We discussed diet and exercise extensively. Patient reports she does not check BP at home because it  Is "always good". 128/70 at PCP office yesterday. She does not have a structured exercise plan and reports staying active packing up her house in preparation of moving. Reports still being in the planning phases. Her pension will run out at the end of the year and she will only have SSI of $900/month. Referred her to the  [facility] counselor for assistance in plan selections.  Plan  Continue current medications .      Hyperlipidemia/CAD s/p CABG x 3 96, 93 MI   LDL goal < 70  Lipid Panel     Component Value Date/Time   CHOL 182 07/24/2020 0000   CHOL 177 12/25/2019 1108   TRIG 129 07/24/2020 0000   HDL 46 07/24/2020 0000   HDL 48 12/25/2019 1108   LDLCALC 110 07/24/2020 0000   LDLCALC 103 (H) 12/25/2019 1108    Hepatic Function Latest Ref Rng & Units 07/24/2020 05/02/2020 12/25/2019  Total Protein 6.0 - 8.5 g/dL - 7.0 6.7  Albumin 3.7 - 4.7 g/dL - 4.2 4.1  AST 13 - 35 _0 ALT 7 - 35 _1 Alk Phosphatase 25 - 125 82 75 88  Total Bilirubin 0.0 - 1.2 mg/dL - 0.4 0.2     The ASCVD Risk score ([place]., et al., 2013) failed to calculate for the following reasons:   The patient has a prior MI or stroke diagnosis   Patient has failed these meds in past: lovastatin Patient is currently uncontrolled on the following medications:  . Lovastatin 20 mg qd .  ASA 81 mg qd  We discussed:  diet and exercise extensively.Patient changed to lovastatin due to muscle weakness. She requested to change to Crestor and RX for 5 mg  has been sent to [facility]. We reviewed signs/symptoms of bleeding with asa therapy. She denies any excessive bleeding with tooth extractions despite not holding asa prior to  procedure.  Plan  Recommend changing to rosuvastatin. Consider increasing dose if lipids remain above goal at next check.    Medication Management   Pt uses [facility] mail order and [place] for all medications Uses pill box? No. Patient has vials organized in a tote with caps labeled Pt endorses 90 % compliance    Plan  Continue current medication management strategy    Follow up: 3   month phone visit  Junita Push. Kenton Kingfisher PharmD, [facility] [phone]

## 2020-08-26 DIAGNOSIS — L501 Idiopathic urticaria: Secondary | ICD-10-CM | POA: Diagnosis not present

## 2020-08-26 DIAGNOSIS — D485 Neoplasm of uncertain behavior of skin: Secondary | ICD-10-CM | POA: Diagnosis not present

## 2020-08-26 DIAGNOSIS — L820 Inflamed seborrheic keratosis: Secondary | ICD-10-CM | POA: Diagnosis not present

## 2020-08-26 DIAGNOSIS — D2272 Melanocytic nevi of left lower limb, including hip: Secondary | ICD-10-CM | POA: Diagnosis not present

## 2020-08-26 DIAGNOSIS — D692 Other nonthrombocytopenic purpura: Secondary | ICD-10-CM | POA: Diagnosis not present

## 2020-08-26 DIAGNOSIS — L298 Other pruritus: Secondary | ICD-10-CM | POA: Diagnosis not present

## 2020-08-26 DIAGNOSIS — L821 Other seborrheic keratosis: Secondary | ICD-10-CM | POA: Diagnosis not present

## 2020-08-28 ENCOUNTER — Telehealth: Payer: Self-pay | Admitting: Pharmacist

## 2020-08-28 NOTE — Chronic Care Management (AMB) (Signed)
Chronic Care Management Pharmacy Assistant   Name: Angela Rose  MRN: [id number] DOB: 10-23-1945  Reason for Encounter: (Attempted) Diabetes Disease State Call  Patient Questions:  1.  Have you seen any other providers since your last visit? No  2.  Any changes in your medicines or health? No     PCP : Glean Hess, MD  Allergies:   Allergies  Allergen Reactions  . Propoxyphene Nausea And Vomiting    Other reaction(s): Vomiting  . Lidocaine-Epinephrine     Other reaction(s): Other (See Comments) palpitatins and chest pain  . Other Other (See Comments)    sutures - in her hand-very irritated  . Tape Rash    BANDAIDS. BANDAIDS  . Xylocaine  [Lidocaine Hcl]     Other reaction(s): Other (See Comments) palpitatins and chest pain  . Jardiance [Empagliflozin] Other (See Comments)    Recurrent vaginal infections    Medications: Outpatient Encounter Medications as of 08/28/2020  Medication Sig  . amLODipine (NORVASC) 2.5 MG tablet TAKE 1 TABLET BY MOUTH  DAILY (Patient taking differently: every evening. )  . amoxicillin (AMOXIL) 500 MG capsule Take 500 mg by mouth 3 (three) times daily. X 10 days  . aspirin 81 MG tablet Take by mouth every evening.   . BD PEN NEEDLE NANO U/F 32G X 4 MM MISC USE 1  TWICE DAILY  . Cyanocobalamin 5000 MCG LOZG Take 1 tablet by mouth daily.  . enalapril (VASOTEC) 5 MG tablet TAKE 1 TABLET BY MOUTH  TWICE DAILY  . FLUoxetine (PROZAC) 40 MG capsule TAKE 1 CAPSULE BY MOUTH  DAILY  . glimepiride (AMARYL) 2 MG tablet Take 1 tablet (2 mg total) by mouth daily. (Patient not taking: Reported on 08/21/2020)  . glucose blood (ONE TOUCH ULTRA TEST) test strip Frequency:PHARMDIR   Dosage:0.0     Instructions:  Note:testing 3-4xqd, DX Code 250.00 Dose: 1  . hydrochlorothiazide (HYDRODIURIL) 25 MG tablet TAKE 1 TABLET BY MOUTH  DAILY  . insulin detemir (LEVEMIR FLEXTOUCH) 100 UNIT/ML FlexPen Inject 30 Units into the skin 2 (two) times daily.  (Patient taking differently: Inject 30 Units into the skin 2 (two) times daily. Taking 25-30 units qam, 20 units qpm)  . Investigational - Study Medication Study name: Semaglutide 3 mg or placebo tablets - 7 tablets  . levothyroxine (SYNTHROID) 50 MCG tablet TAKE 1 TABLET BY MOUTH  DAILY BEFORE BREAKFAST  . lovastatin (MEVACOR) 20 MG tablet Take by mouth.  . meloxicam (MOBIC) 15 MG tablet Take 1 tablet (15 mg total) by mouth daily. (Patient not taking: Reported on 08/21/2020)  . metFORMIN (GLUCOPHAGE-XR) 500 MG 24 hr tablet Take 2 tablets (1,000 mg total) by mouth 2 (two) times daily.  . rosuvastatin (CRESTOR) 5 MG tablet Take 1 tablet (5 mg total) by mouth daily. (Patient not taking: Reported on 08/21/2020)   No facility-administered encounter medications on file as of 08/28/2020.    Current Diagnosis: Patient Active Problem List   Diagnosis Date Noted  . Atrial fibrillation ([place]) 12/25/2018  . Cataract, nuclear sclerotic, both eyes 11/08/2018  . OAB (overactive bladder) 08/11/2018  . Tinnitus aurium, bilateral 08/11/2018  . Renal cyst, right 05/01/2018  . 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  . Other tear of medial meniscus, current injury, left knee, initial encounter 12/31/2015  . Type II diabetes mellitus with complication ([place]) 38/45/3646  . Hyperlipidemia associated with type 2 diabetes mellitus ([place]) 08/14/2015  . Coronary artery disease involving native coronary artery of native heart  without angina pectoris 08/14/2015  . Basal cell carcinoma 08/11/2015  . History of melanoma 08/11/2015  . Major depression in partial remission ([place]) 08/08/2015  . Non-proliferative diabetic retinopathy, both eyes ([place]) 08/08/2015  . History of colon polyps 08/08/2015  . Hypothyroidism, postablative 08/08/2015  . Carotid artery plaque 09/03/2014  . Essential (primary) hypertension 09/03/2014  . APC (atrial premature contractions) 09/03/2014   Recent Relevant Labs: Lab Results  Component Value  Date/Time   HGBA1C 7.7 08/12/2020 12:00 AM   HGBA1C 8.7 (H) 05/02/2020 10:17 AM   HGBA1C 8.9 05/01/2020 12:00 AM    Kidney Function Lab Results  Component Value Date/Time   CREATININE 1.24 (H) 05/02/2020 10:17 AM   CREATININE 0.99 12/25/2019 11:08 AM   GFRNONAA 42 (L) 05/02/2020 10:17 AM   GFRAA 49 (L) 05/02/2020 10:17 AM   08/28/20-CPA made 3 unsuccessful outreach attempts to the patient regarding her blood sugar monitoring. CPA left HIPAA compliant voicemail.  Raynelle Highland, [place] Assistant [phone]   Follow-Up:  CPA will have Cloretta Ned (PTM) follow-up with the patient and notify CPP Birdena Crandall.

## 2020-09-02 DIAGNOSIS — E113213 Type 2 diabetes mellitus with mild nonproliferative diabetic retinopathy with macular edema, bilateral: Secondary | ICD-10-CM | POA: Diagnosis not present

## 2020-09-02 DIAGNOSIS — H43813 Vitreous degeneration, bilateral: Secondary | ICD-10-CM | POA: Diagnosis not present

## 2020-09-02 DIAGNOSIS — H40003 Preglaucoma, unspecified, bilateral: Secondary | ICD-10-CM | POA: Diagnosis not present

## 2020-09-02 DIAGNOSIS — Z961 Presence of intraocular lens: Secondary | ICD-10-CM | POA: Diagnosis not present

## 2020-10-15 ENCOUNTER — Ambulatory Visit: Payer: Medicare Other

## 2020-10-31 ENCOUNTER — Other Ambulatory Visit: Payer: Self-pay | Admitting: Internal Medicine

## 2020-10-31 DIAGNOSIS — I10 Essential (primary) hypertension: Secondary | ICD-10-CM

## 2020-11-11 ENCOUNTER — Telehealth: Payer: Self-pay

## 2020-11-11 NOTE — Telephone Encounter (Unsigned)
Copied from [place] [id number]. Topic: General - Inquiry >> Nov 11, 2020  9:57 AM Greggory Keen D wrote: Reason for CRM: Pt called saying she tested positive for Covid.  She has cold like symptoms.  She took her test yesterday.  She just wants to let Dr. [name] know.

## 2020-11-11 NOTE — Telephone Encounter (Signed)
Note. Pt flag in pt chart.  KP

## 2020-11-13 ENCOUNTER — Other Ambulatory Visit: Payer: Self-pay | Admitting: Internal Medicine

## 2020-11-13 DIAGNOSIS — E113299 Type 2 diabetes mellitus with mild nonproliferative diabetic retinopathy without macular edema, unspecified eye: Secondary | ICD-10-CM

## 2020-11-13 MED ORDER — LEVEMIR FLEXTOUCH 100 UNIT/ML ~~LOC~~ SOPN: 30.0000 [IU] | PEN_INJECTOR | Freq: Two times a day (BID) | SUBCUTANEOUS | 3 refills | Status: DC

## 2020-11-13 NOTE — Telephone Encounter (Signed)
Medication Refill - Medication: insulin detemir (LEVEMIR FLEXTOUCH) 100 UNIT/ML FlexPen 15 mL 3 08/20/2020    Sig - Route: Inject 30 Units into the skin 2 (two) times daily. - Subcutaneous     Has the patient contacted their pharmacy? Yes  (Agent: If no, request that the patient contact the pharmacy for the refill.) (Agent: If yes, when and what did the pharmacy advise?) call dr  Preferred Pharmacy (with phone number or street name):  [place] [place] Phone:  [phone]  Fax:  [phone]     PLEASE SEND THIS TO [facility] AND NOT MAIL ORDER. PT IS OUT OF THIS THIS EVENING!!!!

## 2020-11-13 NOTE — Telephone Encounter (Signed)
Requested medication (s) are due for refill today:   Yes  Requested medication (s) are on the active medication list:   Yes  Future visit scheduled:   Yes   Last ordered: 08/20/2020 55ml, 3 refills  Clinic note:  Returned for dose clarification.  One is for 25 units and the other is for 30 units.     Requested Prescriptions  Pending Prescriptions Disp Refills   LEVEMIR FLEXTOUCH 100 UNIT/ML FlexPen [Pharmacy Med Name: Levemir FlexTouch 100 UNIT/ML Subcutaneous Solution Pen-injector] 15 mL 0    Sig: INJECT 25 UNITS SUBCUTANEOUSLY TWICE DAILY      Endocrinology:  Diabetes - Insulins Passed - 11/13/2020 12:06 PM      Passed - HBA1C is between 0 and 7.9 and within 180 days    Hemoglobin A1C  Date Value Ref Range Status  08/12/2020 7.7  Final          Passed - Valid encounter within last 6 months    Recent Outpatient Visits           2 months ago Essential (primary) hypertension   [facility] Glean Hess, MD   6 months ago Type II diabetes mellitus with complication [facility])   [facility] Glean Hess, MD   10 months ago Annual physical exam   [facility] Glean Hess, MD   1 year ago Acute cystitis without hematuria   [facility] Glean Hess, MD   1 year ago Acute cystitis with hematuria   [facility] Glean Hess, MD       Future Appointments             In 1 month [name] [name], MD [facility], [facility]

## 2020-11-25 ENCOUNTER — Telehealth: Payer: Medicare Other

## 2020-11-25 NOTE — Progress Notes (Deleted)
Chronic Care Management Pharmacy Note  11/25/2020 Name:  Angela Rose MRN:  [id number] DOB:  December 02, 1945  Subjective: Angela Rose is an 75 y.o. year old female who is a primary patient of Angela Rose, [name], MD.  The CCM team was consulted for assistance with disease management and care coordination needs.    Engaged with patient by telephone for follow up visit in response to provider referral for pharmacy case management and/or care coordination services.   Consent to Services:  The patient was given information about Chronic Care Management services, agreed to services, and gave verbal consent prior to initiation of services.  Please see initial visit note for detailed documentation.   Patient Care Team: Glean Hess, MD as PCP - General (Internal Medicine) Corey Skains, MD as Consulting Physician (Cardiology) Hollice Espy, MD as Consulting Physician (Urology) Vladimir Faster, [facility] (Pharmacist)  Next office visit in March  Recent consult visits: 11/21/20- [facility] phone visit RN-  11/11/20- [facility] research encounter SOUL study- oral semaglutide CV outcomes in T2DM-- patient COVID positive 09/02/20- Dr. Jacklynn Ganong, ophthamology- diabetic eye exam- NPDR with macular edema, glaucoma both eyes, posterior vitreous detachment both eyes  Hospital visits: None in previous 6 months  Objective:  Lab Results  Component Value Date   CREATININE 1.24 (H) 05/02/2020   BUN 30 (H) 05/02/2020   GFRNONAA 42 (L) 05/02/2020   GFRAA 49 (L) 05/02/2020   NA 138 05/02/2020   K 4.4 05/02/2020   CALCIUM 9.5 05/02/2020   CO2 22 05/02/2020    Lab Results  Component Value Date/Time   HGBA1C 7.7 08/12/2020 12:00 AM   HGBA1C 8.7 (H) 05/02/2020 10:17 AM   HGBA1C 8.9 05/01/2020 12:00 AM    Last diabetic Eye exam:  Lab Results  Component Value Date/Time   HMDIABEYEEXA Retinopathy (A) 08/08/2018 12:00 AM    Last diabetic Foot exam: No results found for: HMDIABFOOTEX   Lab  Results  Component Value Date   CHOL 182 07/24/2020   HDL 46 07/24/2020   LDLCALC 110 07/24/2020   TRIG 129 07/24/2020   CHOLHDL 3.7 12/25/2019    Hepatic Function Latest Ref Rng & Units 07/24/2020 05/02/2020 12/25/2019  Total Protein 6.0 - 8.5 g/dL - 7.0 6.7  Albumin 3.7 - 4.7 g/dL - 4.2 4.1  AST 13 - 35 16 25 16   ALT 7 - 35 8 17 11   Alk Phosphatase 25 - 125 82 75 88  Total Bilirubin 0.0 - 1.2 mg/dL - 0.4 0.2    Lab Results  Component Value Date/Time   TSH 1.850 12/25/2019 11:08 AM   TSH 4.690 (H) 12/25/2018 11:07 AM   FREET4 1.06 12/25/2019 11:08 AM    CBC Latest Ref Rng & Units 12/25/2019 12/25/2018 04/24/2018  WBC 3.4 - 10.8 x10E3/uL 6.1 7.9 7.0  Hemoglobin 11.1 - 15.9 g/dL 12.0 12.3 12.1  Hematocrit 34.0 - 46.6 % 37.4 37.6 36.7  Platelets 150 - 450 x10E3/uL 292 352 319    No results found for: VD25OH  Clinical ASCVD: Yes  The ASCVD Risk score Mikey Bussing DC Jr., et al., 2013) failed to calculate for the following reasons:   The patient has a prior MI or stroke diagnosis    Depression screen [facility] 2/9 08/20/2020 05/02/2020 12/25/2019  Decreased Interest 1 1 1   Down, Depressed, Hopeless 1 1 1   PHQ - 2 Score 2 2 2   Altered sleeping 0 1 0  Tired, decreased energy 0 0 0  Change in appetite 0 0  1  Feeling bad or failure about yourself  0 0 0  Trouble concentrating 0 0 0  Moving slowly or fidgety/restless 0 0 0  Suicidal thoughts 0 0 0  PHQ-9 Score 2 3 3   Difficult doing work/chores Not difficult at all Not difficult at all Not difficult at all  Some recent data might be hidden     ***Other: (CHADS2VASc if Afib, MMRC or CAT for COPD, ACT, DEXA)  Social History   Tobacco Use  Smoking Status Former Smoker  . Quit date: 06/11/1982  . Years since quitting: 38.4  Smokeless Tobacco Never Used  Tobacco Comment   smoking cessation materials not required   BP Readings from Last 3 Encounters:  08/20/20 128/70  05/02/20 (!) 130/56  12/25/19 136/72   Pulse Readings from Last 3  Encounters:  08/20/20 74  05/02/20 77  12/25/19 72   Wt Readings from Last 3 Encounters:  08/20/20 169 lb (76.7 kg)  05/02/20 171 lb (77.6 kg)  12/25/19 176 lb (79.8 kg)    Assessment/Interventions: Review of patient past medical history, allergies, medications, health status, including review of consultants reports, laboratory and other test data, was performed as part of comprehensive evaluation and provision of chronic care management services.   SDOH:  (Social Determinants of Health) assessments and interventions performed: {yes/no:20286}   CCM Care Plan  Allergies  Allergen Reactions  . Propoxyphene Nausea And Vomiting    Other reaction(s): Vomiting  . Lidocaine-Epinephrine     Other reaction(s): Other (See Comments) palpitatins and chest pain  . Other Other (See Comments)    sutures - in her hand-very irritated  . Tape Rash    BANDAIDS. BANDAIDS  . Xylocaine  [Lidocaine Hcl]     Other reaction(s): Other (See Comments) palpitatins and chest pain  . Jardiance [Empagliflozin] Other (See Comments)    Recurrent vaginal infections    Medications Reviewed Today    Reviewed by Vladimir Faster, [facility] (Pharmacist) on 08/21/20 at [phone]  Med List Status: <None>  Medication Order Taking? Sig Documenting Provider Last Dose Status Informant  amLODipine (NORVASC) 2.5 MG tablet [id number] Yes TAKE 1 TABLET BY MOUTH  DAILY  Patient taking differently: every evening.    Glean Hess, MD Taking Active Self  aspirin 81 MG tablet [id number] Yes Take by mouth every evening.  [provider] Taking Active Self           Med Note Romana Juniper Oct 15, 2019  2:13 PM)    BD PEN NEEDLE NANO U/F 32G X 4 MM MISC [id number] Yes USE 1  TWICE DAILY Glean Hess, MD Taking Active   Cyanocobalamin 5000 MCG LOZG [id number] Yes Take 1 tablet by mouth daily. [provider] Taking Active   enalapril (VASOTEC) 5 MG tablet [id number] Yes TAKE 1 TABLET BY MOUTH  TWICE DAILY  Glean Hess, MD Taking Active   FLUoxetine (PROZAC) 40 MG capsule [id number] Yes TAKE 1 CAPSULE BY MOUTH  DAILY Glean Hess, MD Taking Active   glimepiride (AMARYL) 2 MG tablet [id number] No Take 1 tablet (2 mg total) by mouth daily.  Patient not taking: Reported on 08/21/2020   Glean Hess, MD Not Taking Active   glucose blood (ONE TOUCH ULTRA TEST) test strip [id number] Yes Frequency:PHARMDIR   Dosage:0.0     Instructions:  Note:testing 3-4xqd, DX Code 250.00 Dose: 1 [provider] Taking Active  Med Note Romana Juniper Oct 15, 2019  2:13 PM)    hydrochlorothiazide (HYDRODIURIL) 25 MG tablet [id number] Yes TAKE 1 TABLET BY MOUTH  DAILY Glean Hess, MD Taking Active   insulin detemir (LEVEMIR FLEXTOUCH) 100 UNIT/ML FlexPen [id number] Yes Inject 30 Units into the skin 2 (two) times daily.  Patient taking differently: Inject 30 Units into the skin 2 (two) times daily. Taking 25-30 units qam, 20 units qpm   Glean Hess, MD Taking Active   Investigational - Study Medication [id number] Yes Study name: Semaglutide 3 mg or placebo tablets - 7 tablets [provider] Taking Active   levothyroxine (SYNTHROID) 50 MCG tablet [id number] Yes TAKE 1 TABLET BY MOUTH  DAILY BEFORE BREAKFAST Glean Hess, MD Taking Active   lovastatin (MEVACOR) 20 MG tablet [id number] Yes Take by mouth. [provider] Taking Active   meloxicam (MOBIC) 15 MG tablet [id number] No Take 1 tablet (15 mg total) by mouth daily.  Patient not taking: Reported on 08/21/2020   Frederich Cha, MD Not Taking Active   metFORMIN (GLUCOPHAGE-XR) 500 MG 24 hr tablet [id number] Yes Take 2 tablets (1,000 mg total) by mouth 2 (two) times daily. Glean Hess, MD Taking Active   rosuvastatin (CRESTOR) 5 MG tablet [id number] No Take 1 tablet (5 mg total) by mouth daily.  Patient not taking: Reported on 08/21/2020   Glean Hess, MD Not Taking Active           Patient  Active Problem List   Diagnosis Date Noted  . Atrial fibrillation ([place]) 12/25/2018  . Cataract, nuclear sclerotic, both eyes 11/08/2018  . OAB (overactive bladder) 08/11/2018  . Tinnitus aurium, bilateral 08/11/2018  . Renal cyst, right 05/01/2018  . 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  . Other tear of medial meniscus, current injury, left knee, initial encounter 12/31/2015  . Type II diabetes mellitus with complication ([place]) 08/26/5207  . Hyperlipidemia associated with type 2 diabetes mellitus ([place]) 08/14/2015  . Coronary artery disease involving native coronary artery of native heart without angina pectoris 08/14/2015  . Basal cell carcinoma 08/11/2015  . History of melanoma 08/11/2015  . Major depression in partial remission ([place]) 08/08/2015  . Non-proliferative diabetic retinopathy, both eyes ([place]) 08/08/2015  . History of colon polyps 08/08/2015  . Hypothyroidism, postablative 08/08/2015  . Carotid artery plaque 09/03/2014  . Essential (primary) hypertension 09/03/2014  . APC (atrial premature contractions) 09/03/2014    Immunization History  Administered Date(s) Administered  . Fluad Quad(high Dose 65+) 07/25/2019, 08/20/2020  . Influenza, High Dose Seasonal PF 06/20/2017, 06/23/2018  . Influenza,inj,Quad PF,6+ Mos 08/14/2015, 09/27/2016  . PFIZER(Purple Top)SARS-COV-2 Vaccination 12/01/2019, 12/23/2019, 07/20/2020  . Pneumococcal Conjugate-13 02/16/2016  . Pneumococcal Polysaccharide-23 11/15/2008, 10/14/2014    Conditions to be addressed/monitored:  Hypertension, Hyperlipidemia, Diabetes, Atrial Fibrillation, Coronary Artery Disease and Depression  There are no care plans that you recently modified to display for this patient.    Medication Assistance: {MEDASSISTANCEINFO:25044}  Patient's preferred pharmacy is:  [facility] [address] [place] [address] Phone: [phone] Fax: [phone]  [facility], [place], [address] [place], [address] [place] [address] Phone: [phone] Fax: [phone]  Uses pill box? No - Keeps vials organized in tolte with labeled tops. Pt endorses ***% compliance  We discussed: {Pharmacy options:24294} Patient decided to: {US Pharmacy [facility]  Care Plan and Follow Up  Patient Decision:  {FOLLOWUP:24991}  Plan: {CM FOLLOW UP DDUK:02542}  Junita Push. Kenton Kingfisher PharmD, [facility] [phone]  Current Barriers:  . {pharmacybarriers:24917} . ***  Pharmacist Clinical Goal(s):  Marland Kitchen Over the next *** days, patient will {PHARMACYGOALCHOICES:24921} through collaboration with PharmD and provider.  . ***  Interventions: . 1:1 collaboration with Glean Hess, MD regarding development and update of comprehensive plan of care as evidenced by provider attestation and co-signature . Inter-disciplinary care team collaboration (see longitudinal plan of care) . Comprehensive medication review performed; medication list updated in electronic medical record  Hypertension (BP goal <130/80) -controlled -Current treatment: . Enalapril 5 mg bid . Amlodipine 2.5 mg qd . HCTZ 25 mg qd -Medications previously tried: ***  -Current home readings: *** -Current dietary habits: *** -Current exercise habits: *** -{ACTIONS;DENIES/REPORTS:21021675::"Denies"} hypotensive/hypertensive symptoms -Educated on {CCM BP Counseling:25124} -Counseled to monitor BP at home ***, document, and provide log at future appointments -{CCMPHARMDINTERVENTION:25122}  Hyperlipidemia: (LDL goal < 70) -uncontrolled -Current treatment: . *** -Medications previously tried: ***  -Current dietary patterns: *** -Current exercise habits: *** -Educated on {CCM HLD Counseling:25126} -{CCMPHARMDINTERVENTION:25122}  Diabetes (A1c goal {A1c goals:23924}) -{CHL  Controlled/Uncontrolled:[phone]} -Current medications: . *** -Medications previously tried: ***  -Current home glucose readings . fasting glucose: *** . post prandial glucose: *** -{ACTIONS;DENIES/REPORTS:21021675::"Denies"} hypoglycemic/hyperglycemic symptoms -Current meal patterns:  . breakfast: ***  . lunch: ***  . dinner: *** . snacks: *** . drinks: *** -Current exercise: *** -Educated on{CCM DM COUNSELING:25123} -Counseled to check feet daily and get yearly eye exams -{CCMPHARMDINTERVENTION:25122}  Atrial Fibrillation (Goal: prevent stroke and major bleeding) -{CHL Controlled/Uncontrolled:[phone]}  -CHADSVASC: *** -Current treatment: . Rate control: *** . Anticoagulation: *** -Medications previously tried: *** -Home BP and HR readings: ***  -Counseled on {CCMAFIBCOUNSELING:25120} -{CCMPHARMDINTERVENTION:25122}  Depression/Anxiety (Goal: ***) -{CHL Controlled/Uncontrolled:[phone]} -Current treatment: . *** -Medications previously tried/failed: *** -PHQ9: *** -GAD7: *** -Connected with *** for mental health support -Educated on {CCM mental health counseling:25127} -{CCMPHARMDINTERVENTION:25122}  Osteoporosis / Osteopenia (Goal ***) -{CHL Controlled/Uncontrolled:[phone]} -Last DEXA Scan: ***   T-Score femoral neck: ***  T-Score total hip: ***  T-Score lumbar spine: ***  T-Score forearm radius: ***  10-year probability of major osteoporotic fracture: ***  10-year probability of hip fracture: *** -Patient {is;is not an osteoporosis candidate:23886} -Current treatment  . *** -Medications previously tried: ***  -{Osteoporosis Counseling:23892} -{CCMPHARMDINTERVENTION:25122}   Patient Goals/Self-Care Activities . Over the next *** days, patient will:  - {pharmacypatientgoals:24919}  Follow Up Plan: {CM FOLLOW UP TDVV:61607}

## 2020-12-22 ENCOUNTER — Ambulatory Visit: Payer: Medicare Other | Admitting: Internal Medicine

## 2020-12-23 ENCOUNTER — Ambulatory Visit: Payer: Medicare Other

## 2020-12-23 NOTE — Progress Notes (Incomplete)
Chronic Care Management Pharmacy Note  12/23/2020 Name:  Wilhemina Grall MRN:  [id number] DOB:  [birth year]  Subjective: Angela Rose is an 75 y.o. year old female who is a primary patient of [name], [name], MD.  The CCM team was consulted for assistance with disease management and care coordination needs.    Engaged with patient by telephone for follow up visit in response to provider referral for pharmacy case management and/or care coordination services.   Consent to Services:  The patient was given information about Chronic Care Management services, agreed to services, and gave verbal consent prior to initiation of services.  Please see initial visit note for detailed documentation.   Patient Care Team: Glean Hess, MD as PCP - General (Internal Medicine) Corey Skains, MD as Consulting Physician (Cardiology) Hollice Espy, MD as Consulting Physician (Urology) Vladimir Faster, [facility] (Pharmacist)  Recent office visits: none  Recent consult visits: 11/21/20- [place]([facility] research study) phone visit not med changes  Hospital visits: None in previous 6 months  Objective:  Lab Results  Component Value Date   CREATININE 1.24 (H) 05/02/2020   BUN 30 (H) 05/02/2020   GFRNONAA 42 (L) 05/02/2020   GFRAA 49 (L) 05/02/2020   NA 138 05/02/2020   K 4.4 05/02/2020   CALCIUM 9.5 05/02/2020   CO2 22 05/02/2020    Lab Results  Component Value Date/Time   HGBA1C 7.7 08/12/2020 12:00 AM   HGBA1C 8.7 (H) 05/02/2020 10:17 AM   HGBA1C 8.9 05/01/2020 12:00 AM    Last diabetic Eye exam:  Lab Results  Component Value Date/Time   HMDIABEYEEXA Retinopathy (A) 08/08/2018 12:00 AM    Last diabetic Foot exam: No results found for: HMDIABFOOTEX   Lab Results  Component Value Date   CHOL 182 07/24/2020   HDL 46 07/24/2020   LDLCALC 110 07/24/2020   TRIG 129 07/24/2020   CHOLHDL 3.7 12/25/2019    Hepatic Function Latest Ref Rng & Units 07/24/2020 05/02/2020  12/25/2019  Total Protein 6.0 - 8.5 g/dL - 7.0 6.7  Albumin 3.7 - 4.7 g/dL - 4.2 4.1  AST 13 - 35 _0 ALT 7 - 35 _1 Alk Phosphatase 25 - 125 82 75 88  Total Bilirubin 0.0 - 1.2 mg/dL - 0.4 0.2    Lab Results  Component Value Date/Time   TSH 1.850 12/25/2019 11:08 AM   TSH 4.690 (H) 12/25/2018 11:07 AM   FREET4 1.06 12/25/2019 11:08 AM    CBC Latest Ref Rng & Units 12/25/2019 12/25/2018 04/24/2018  WBC 3.4 - 10.8 x10E3/uL 6.1 7.9 7.0  Hemoglobin 11.1 - 15.9 g/dL 12.0 12.3 12.1  Hematocrit 34.0 - 46.6 % 37.4 37.6 36.7  Platelets 150 - 450 x10E3/uL 292 352 319    No results found for: VD25OH  Clinical ASCVD: Yes  The ASCVD Risk score Mikey Bussing DC Jr., et al., 2013) failed to calculate for the following reasons:   The patient has a prior MI or stroke diagnosis    Depression screen [facility] 2/9 08/20/2020 05/02/2020 12/25/2019  Decreased Interest _2 Down, Depressed, Hopeless _3 PHQ - 2 Score _4 Altered sleeping 0 1 0  Tired, decreased energy 0 0 0  Change in appetite 0 0 1  Feeling bad or failure about yourself  0 0 0  Trouble concentrating 0 0 0  Moving slowly or fidgety/restless 0 0 0  Suicidal thoughts 0 0 0  PHQ-9 Score _0 Difficult doing work/chores Not difficult at all Not difficult at all Not difficult at all  Some recent data might be hidden    CHA2DS2-VASc Score = 8  The patient's score is based upon: CHF History: No HTN History: Yes Diabetes History: Yes Stroke History: Yes Vascular Disease History: Yes Age Score: 2 Gender Score: 1  { .c  Social History   Tobacco Use  Smoking Status Former Smoker  . Quit date: 06/11/1982  . Years since quitting: 38.5  Smokeless Tobacco Never Used  Tobacco Comment   smoking cessation materials not required   BP Readings from Last 3 Encounters:  08/20/20 128/70  05/02/20 (!) 130/56  12/25/19 136/72   Pulse Readings from Last 3 Encounters:  08/20/20 74  05/02/20 77  12/25/19 72   Wt Readings  from Last 3 Encounters:  08/20/20 169 lb (76.7 kg)  05/02/20 171 lb (77.6 kg)  12/25/19 176 lb (79.8 kg)    Assessment/Interventions: Review of patient past medical history, allergies, medications, health status, including review of consultants reports, laboratory and other test data, was performed as part of comprehensive evaluation and provision of chronic care management services.   SDOH:  (Social Determinants of Health) assessments and interventions performed: No   CCM Care Plan  Allergies  Allergen Reactions  . Propoxyphene Nausea And Vomiting    Other reaction(s): Vomiting  . Lidocaine-Epinephrine     Other reaction(s): Other (See Comments) palpitatins and chest pain  . Other Other (See Comments)    sutures - in her hand-very irritated  . Tape Rash    BANDAIDS. BANDAIDS  . Xylocaine  [Lidocaine Hcl]     Other reaction(s): Other (See Comments) palpitatins and chest pain  . Jardiance [Empagliflozin] Other (See Comments)    Recurrent vaginal infections    Medications Reviewed Today    Reviewed by Vladimir Faster, [facility] (Pharmacist) on 08/21/20 at [phone]  Med List Status: <None>  Medication Order Taking? Sig Documenting Provider Last Dose Status Informant  amLODipine (NORVASC) 2.5 MG tablet [id number] Yes TAKE 1 TABLET BY MOUTH  DAILY  Patient taking differently: every evening.    Glean Hess, MD Taking Active Self  aspirin 81 MG tablet [id number] Yes Take by mouth every evening.  [provider] Taking Active Self           Med Note Romana Juniper Oct 15, 2019  2:13 PM)    BD PEN NEEDLE NANO U/F 32G X 4 MM MISC [id number] Yes USE 1  TWICE DAILY Glean Hess, MD Taking Active   Cyanocobalamin 5000 MCG LOZG [id number] Yes Take 1 tablet by mouth daily. [provider] Taking Active   enalapril (VASOTEC) 5 MG tablet [id number] Yes TAKE 1 TABLET BY MOUTH  TWICE DAILY Glean Hess, MD Taking Active   FLUoxetine (PROZAC) 40 MG capsule [id number]  Yes TAKE 1 CAPSULE BY MOUTH  DAILY Glean Hess, MD Taking Active   glimepiride (AMARYL) 2 MG tablet [id number] No Take 1 tablet (2 mg total) by mouth daily.  Patient not taking: Reported on 08/21/2020   Glean Hess, MD Not Taking Active   glucose blood (ONE TOUCH ULTRA TEST) test strip [id number] Yes Frequency:PHARMDIR   Dosage:0.0     Instructions:  Note:testing 3-4xqd, DX Code 250.00 Dose: 1 [provider] Taking Active            Med Note Gloris Ham, [place] [facility]   Mon  Oct 15, 2019  2:13 PM)    hydrochlorothiazide (HYDRODIURIL) 25 MG tablet [id number] Yes TAKE 1 TABLET BY MOUTH  DAILY Glean Hess, MD Taking Active   insulin detemir (LEVEMIR FLEXTOUCH) 100 UNIT/ML FlexPen [id number] Yes Inject 30 Units into the skin 2 (two) times daily.  Patient taking differently: Inject 30 Units into the skin 2 (two) times daily. Taking 25-30 units qam, 20 units qpm   Glean Hess, MD Taking Active   Investigational - Study Medication [id number] Yes Study name: Semaglutide 3 mg or placebo tablets - 7 tablets [provider] Taking Active   levothyroxine (SYNTHROID) 50 MCG tablet [id number] Yes TAKE 1 TABLET BY MOUTH  DAILY BEFORE BREAKFAST Glean Hess, MD Taking Active   lovastatin (MEVACOR) 20 MG tablet [id number] Yes Take by mouth. [provider] Taking Active   meloxicam (MOBIC) 15 MG tablet [id number] No Take 1 tablet (15 mg total) by mouth daily.  Patient not taking: Reported on 08/21/2020   Frederich Cha, MD Not Taking Active   metFORMIN (GLUCOPHAGE-XR) 500 MG 24 hr tablet [id number] Yes Take 2 tablets (1,000 mg total) by mouth 2 (two) times daily. Glean Hess, MD Taking Active   rosuvastatin (CRESTOR) 5 MG tablet [id number] No Take 1 tablet (5 mg total) by mouth daily.  Patient not taking: Reported on 08/21/2020   Glean Hess, MD Not Taking Active           Patient Active Problem List   Diagnosis Date Noted  . Atrial fibrillation ([place]) 12/25/2018   . Cataract, nuclear sclerotic, both eyes 11/08/2018  . OAB (overactive bladder) 08/11/2018  . Tinnitus aurium, bilateral 08/11/2018  . Renal cyst, right 05/01/2018  . 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  . Other tear of medial meniscus, current injury, left knee, initial encounter 12/31/2015  . Type II diabetes mellitus with complication ([place]) 23/53/6144  . Hyperlipidemia associated with type 2 diabetes mellitus ([place]) 08/14/2015  . Coronary artery disease involving native coronary artery of native heart without angina pectoris 08/14/2015  . Basal cell carcinoma 08/11/2015  . History of melanoma 08/11/2015  . Major depression in partial remission ([place]) 08/08/2015  . Non-proliferative diabetic retinopathy, both eyes ([place]) 08/08/2015  . History of colon polyps 08/08/2015  . Hypothyroidism, postablative 08/08/2015  . Carotid artery plaque 09/03/2014  . Essential (primary) hypertension 09/03/2014  . APC (atrial premature contractions) 09/03/2014    Immunization History  Administered Date(s) Administered  . Fluad Quad(high Dose 65+) 07/25/2019, 08/20/2020  . Influenza, High Dose Seasonal PF 06/20/2017, 06/23/2018  . Influenza,inj,Quad PF,6+ Mos 08/14/2015, 09/27/2016  . PFIZER(Purple Top)SARS-COV-2 Vaccination 12/01/2019, 12/23/2019, 07/20/2020  . Pneumococcal Conjugate-13 02/16/2016  . Pneumococcal Polysaccharide-23 11/15/2008, 10/14/2014    Conditions to be addressed/monitored:  Hypertension, Hyperlipidemia, Diabetes, Coronary Artery Disease, Chronic Kidney Disease, Hypothyroidism and Depression  There are no care plans that you recently modified to display for this patient.    Medication Assistance: {MEDASSISTANCEINFO:25044}  Patient's preferred pharmacy is:  [facility] [address], [place] [address] Phone: [phone] Fax: [phone]  [facility], [place], [address] [place], [address] [place] [address] Phone: [phone] Fax: [phone]  Uses pill box? {Yes or If no, why not?:20788} Pt endorses ***% compliance  We discussed: {Pharmacy options:24294} Patient decided to: {US Pharmacy Plan:23885}  Care Plan and Follow Up Patient Decision:  {FOLLOWUP:24991}  Plan: {CM FOLLOW UP PLAN:25073}  ***

## 2021-01-24 ENCOUNTER — Other Ambulatory Visit: Payer: Self-pay | Admitting: Internal Medicine

## 2021-01-24 DIAGNOSIS — E890 Postprocedural hypothyroidism: Secondary | ICD-10-CM

## 2021-01-25 NOTE — Telephone Encounter (Signed)
Requested Prescriptions  Pending Prescriptions Disp Refills  . FLUoxetine (PROZAC) 40 MG capsule [Pharmacy Med Name: FLUoxetine HCl 40 MG Oral Capsule] 90 capsule 0    Sig: TAKE 1 CAPSULE BY MOUTH  DAILY     Psychiatry:  Antidepressants - SSRI Passed - 01/24/2021 11:21 PM      Passed - Completed PHQ-2 or PHQ-9 in the last 360 days      Passed - Valid encounter within last 6 months    Recent Outpatient Visits          5 months ago Essential (primary) hypertension   [facility] Glean Hess, MD   8 months ago Type II diabetes mellitus with complication [facility])   [facility] Glean Hess, MD   1 year ago Annual physical exam   [facility], MD   1 year ago Acute cystitis without hematuria   [facility] Glean Hess, MD   1 year ago Acute cystitis with hematuria   [facility] Glean Hess, MD             . levothyroxine (SYNTHROID) 50 MCG tablet [Pharmacy Med Name: Levothyroxine Sodium 50 MCG Oral Tablet] 90 tablet     Sig: TAKE 1 TABLET BY MOUTH  DAILY BEFORE BREAKFAST     Endocrinology:  Hypothyroid Agents Failed - 01/24/2021 11:21 PM      Failed - TSH needs to be rechecked within 3 months after an abnormal result. Refill until TSH is due.      Failed - TSH in normal range and within 360 days    TSH  Date Value Ref Range Status  12/25/2019 1.850 0.450 - 4.500 uIU/mL Final         Passed - Valid encounter within last 12 months    Recent Outpatient Visits          5 months ago Essential (primary) hypertension   [facility] Glean Hess, MD   8 months ago Type II diabetes mellitus with complication [facility])   [facility] Glean Hess, MD   1 year ago Annual physical exam   [facility] Glean Hess, MD   1 year ago Acute cystitis without hematuria   [facility] Glean Hess, MD   1 year ago Acute cystitis with hematuria   [facility] Glean Hess, MD

## 2021-02-01 ENCOUNTER — Other Ambulatory Visit: Payer: Self-pay | Admitting: Internal Medicine

## 2021-02-01 DIAGNOSIS — E1169 Type 2 diabetes mellitus with other specified complication: Secondary | ICD-10-CM

## 2021-02-01 DIAGNOSIS — E785 Hyperlipidemia, unspecified: Secondary | ICD-10-CM

## 2021-02-01 NOTE — Telephone Encounter (Signed)
Requested Prescriptions  Pending Prescriptions Disp Refills  . rosuvastatin (CRESTOR) 5 MG tablet [Pharmacy Med Name: Rosuvastatin Calcium 5 MG Oral Tablet] 90 tablet 2    Sig: TAKE 1 TABLET BY MOUTH  DAILY     Cardiovascular:  Antilipid - Statins Passed - 02/01/2021  5:45 AM      Passed - Total Cholesterol in normal range and within 360 days    Cholesterol, Total  Date Value Ref Range Status  12/25/2019 177 100 - 199 mg/dL Final   Cholesterol  Date Value Ref Range Status  07/24/2020 182 0 - 200 Final         Passed - LDL in normal range and within 360 days    LDL Chol Calc (NIH)  Date Value Ref Range Status  12/25/2019 103 (H) 0 - 99 mg/dL Final   LDL Cholesterol  Date Value Ref Range Status  07/24/2020 110  Final         Passed - HDL in normal range and within 360 days    HDL  Date Value Ref Range Status  07/24/2020 46 35 - 70 Final  12/25/2019 48 >39 mg/dL Final         Passed - Triglycerides in normal range and within 360 days    Triglycerides  Date Value Ref Range Status  07/24/2020 129 40 - 160 Final         Passed - Patient is not pregnant      Passed - Valid encounter within last 12 months    Recent Outpatient Visits          5 months ago Essential (primary) hypertension   [facility] Glean Hess, MD   9 months ago Type II diabetes mellitus with complication [facility])   [facility] Glean Hess, MD   1 year ago Annual physical exam   [facility] Glean Hess, MD   1 year ago Acute cystitis without hematuria   [facility] Glean Hess, [facility]   1 year ago Acute cystitis with hematuria   [facility] Glean Hess, MD

## 2021-02-26 DIAGNOSIS — Z08 Encounter for follow-up examination after completed treatment for malignant neoplasm: Secondary | ICD-10-CM | POA: Diagnosis not present

## 2021-02-26 DIAGNOSIS — D2372 Other benign neoplasm of skin of left lower limb, including hip: Secondary | ICD-10-CM | POA: Diagnosis not present

## 2021-02-26 DIAGNOSIS — L603 Nail dystrophy: Secondary | ICD-10-CM | POA: Diagnosis not present

## 2021-02-26 DIAGNOSIS — L723 Sebaceous cyst: Secondary | ICD-10-CM | POA: Diagnosis not present

## 2021-02-26 DIAGNOSIS — Z7189 Other specified counseling: Secondary | ICD-10-CM | POA: Diagnosis not present

## 2021-02-26 DIAGNOSIS — Z85820 Personal history of malignant melanoma of skin: Secondary | ICD-10-CM | POA: Diagnosis not present

## 2021-02-26 DIAGNOSIS — L923 Foreign body granuloma of the skin and subcutaneous tissue: Secondary | ICD-10-CM | POA: Diagnosis not present

## 2021-03-11 ENCOUNTER — Other Ambulatory Visit: Payer: Self-pay | Admitting: Internal Medicine

## 2021-03-11 DIAGNOSIS — E113299 Type 2 diabetes mellitus with mild nonproliferative diabetic retinopathy without macular edema, unspecified eye: Secondary | ICD-10-CM

## 2021-03-11 MED ORDER — LEVEMIR FLEXTOUCH 100 UNIT/ML ~~LOC~~ SOPN: 30.0000 [IU] | PEN_INJECTOR | Freq: Two times a day (BID) | SUBCUTANEOUS | 0 refills | Status: DC

## 2021-03-11 NOTE — Telephone Encounter (Signed)
Courtesy refill. Called patient on [phone] and line has been disconnected and unable to schedule appt. Patient needs appt for further refills.

## 2021-03-11 NOTE — Telephone Encounter (Signed)
Medication Refill - Medication: insulin detemir (LEVEMIR FLEXTOUCH) 100 UNIT/ML FlexPen   Has the patient contacted their pharmacy? Yes.   (Agent: If no, request that the patient contact the pharmacy for the refill.) (Agent: If yes, when and what did the pharmacy advise?)pt called [facility] and they advised her to have an Rx sent to local pharmacy since pt will be out of medication after today   Preferred Pharmacy (with phone number or street name):  [place], [place] - [id number] [place] [address] [place] Phone:  [phone]  Fax:  [phone]       Agent: Please be advised that RX refills may take up to 3 business days. We ask that you follow-up with your pharmacy.

## 2021-03-20 ENCOUNTER — Telehealth: Payer: Self-pay | Admitting: Internal Medicine

## 2021-03-20 NOTE — Telephone Encounter (Signed)
Copied from [place] [phone]. Topic: Medicare AWV >> Mar 20, 2021 12:56 PM Cher Nakai R wrote: Reason for CRM:  No answer unable to leave a message for patient to call back and schedule Medicare Annual Wellness Visit (AWV) in office.   If unable to come into the office for AWV,  please offer to do virtually or by telephone.  Last AWV: 10/15/2019  Please schedule at anytime with [facility] Health Advisor.  40 minute appointment  Any questions, please contact me at [phone]

## 2021-03-25 ENCOUNTER — Telehealth: Payer: Self-pay | Admitting: Pharmacist

## 2021-03-25 NOTE — Chronic Care Management (AMB) (Signed)
    Chronic Care Management Pharmacy Assistant   Name: Patric Vanpelt  MRN: [id number] DOB: [birth year]   Reason for Encounter: Disease State General Adherence     Recent office visits:  None noted  Recent consult visits:  None noted  Hospital visits:  None in previous 6 months  Medications: Outpatient Encounter Medications as of 03/25/2021  Medication Sig   amLODipine (NORVASC) 2.5 MG tablet TAKE 1 TABLET BY MOUTH  DAILY   aspirin 81 MG tablet Take by mouth every evening.    BD PEN NEEDLE NANO U/F 32G X 4 MM MISC USE 1  TWICE DAILY   betamethasone dipropionate 0.05 % lotion SMARTSIG:Sparingly Topical 1 to 2 Times Daily PRN   Cyanocobalamin 5000 MCG LOZG Take 1 tablet by mouth daily.   enalapril (VASOTEC) 5 MG tablet TAKE 1 TABLET BY MOUTH  TWICE DAILY   FLUoxetine (PROZAC) 40 MG capsule TAKE 1 CAPSULE BY MOUTH  DAILY   glimepiride (AMARYL) 2 MG tablet Take 1 tablet (2 mg total) by mouth daily. (Patient not taking: Reported on 08/21/2020)   glucose blood (ONE TOUCH ULTRA TEST) test strip Frequency:PHARMDIR   Dosage:0.0     Instructions:  Note:testing 3-4xqd, DX Code 250.00 Dose: 1   hydrochlorothiazide (HYDRODIURIL) 25 MG tablet TAKE 1 TABLET BY MOUTH  DAILY   insulin detemir (LEVEMIR FLEXTOUCH) 100 UNIT/ML FlexPen Inject 30 Units into the skin 2 (two) times daily.   Investigational - Study Medication Study name: Semaglutide 3 mg or placebo tablets - 7 tablets   levothyroxine (SYNTHROID) 50 MCG tablet TAKE 1 TABLET BY MOUTH  DAILY BEFORE BREAKFAST   lovastatin (MEVACOR) 20 MG tablet Take by mouth.   meloxicam (MOBIC) 15 MG tablet Take 1 tablet (15 mg total) by mouth daily. (Patient not taking: Reported on 08/21/2020)   metFORMIN (GLUCOPHAGE-XR) 500 MG 24 hr tablet Take 2 tablets (1,000 mg total) by mouth 2 (two) times daily.   rosuvastatin (CRESTOR) 5 MG tablet TAKE 1 TABLET BY MOUTH  DAILY   No facility-administered encounter medications on file as of 03/25/2021.     Unable to reach patient for Disease state call for General Adherence call.   Star Rating Drugs: Rosuvastatin 5 mg Last filled:08/20/20 90 DS Enalapril 5 mg Last filled:09/06/20 90 DS Metformin 500 Last filled:09/06/20 90 DS Lovastatin 20 mg Last filled:07/24/20 90 DS   Myriam Elta [place], [place]

## 2021-04-11 ENCOUNTER — Other Ambulatory Visit: Payer: Self-pay | Admitting: Internal Medicine

## 2021-04-11 DIAGNOSIS — E113299 Type 2 diabetes mellitus with mild nonproliferative diabetic retinopathy without macular edema, unspecified eye: Secondary | ICD-10-CM

## 2021-04-11 NOTE — Telephone Encounter (Signed)
Requested medication (s) are due for refill today: yes  Requested medication (s) are on the active medication list: yes  Last refill:  03/11/21  Future visit scheduled: no  Notes to clinic:  overdue lab work (last Hgb A1C 08/12/20)/ needs appt   Requested Prescriptions  Pending Prescriptions Disp [place] 100 UNIT/ML FlexPen [Pharmacy Med Name: Levemir FlexTouch 100 UNIT/ML Subcutaneous Solution Pen-injector] 15 mL 0    Sig: INJECT [place]      Endocrinology:  Diabetes - Insulins Failed - 04/11/2021  2:03 PM      Failed - HBA1C is between 0 and 7.9 and within 180 days    Hemoglobin A1C  Date Value Ref Range Status  08/12/2020 7.7  Final          Failed - Valid encounter within last 6 months    Recent Outpatient Visits           7 months ago Essential (primary) hypertension   [facility] Glean [facility], MD   11 months ago Type II diabetes mellitus with complication [facility])   [facility] Glean Hess, MD   1 year ago Annual physical exam   [facility] Glean Hess, MD   1 year ago Acute cystitis without hematuria   [facility] Glean Hess, MD   1 year ago Acute cystitis with hematuria   [facility] Glean Hess, MD

## 2021-04-14 ENCOUNTER — Telehealth: Payer: Self-pay

## 2021-04-14 NOTE — Telephone Encounter (Signed)
Please call pt to schedule an appt for DM and cholesterol.  KP

## 2021-04-14 NOTE — Telephone Encounter (Signed)
Patient needs a diabetic appt asap. Overdue. Please call her to schedule asap.  Thank you.

## 2021-04-14 NOTE — Telephone Encounter (Signed)
Could not reach patient both numbers listed is not a working number.

## 2021-04-14 NOTE — Telephone Encounter (Signed)
Both numbers are disconnected, could not reach patient.

## 2021-04-24 DIAGNOSIS — Z23 Encounter for immunization: Secondary | ICD-10-CM | POA: Diagnosis not present

## 2021-04-26 ENCOUNTER — Other Ambulatory Visit: Payer: Self-pay | Admitting: Internal Medicine

## 2021-04-26 DIAGNOSIS — I10 Essential (primary) hypertension: Secondary | ICD-10-CM

## 2021-04-26 NOTE — Telephone Encounter (Signed)
Requested Prescriptions  Pending Prescriptions Disp Refills  . FLUoxetine (PROZAC) 40 MG capsule [Pharmacy Med Name: FLUoxetine HCl 40 MG Oral Capsule] 90 capsule 0    Sig: TAKE 1 CAPSULE BY MOUTH  DAILY     Psychiatry:  Antidepressants - SSRI Failed - 04/26/2021  5:46 AM      Failed - Valid encounter within last 6 months    Recent Outpatient Visits          8 months ago Essential (primary) hypertension   [facility] Glean Hess, MD   11 months ago Type II diabetes mellitus with complication [facility])   [facility] Glean Hess, MD   1 year ago Annual physical exam   [facility], [facility]   1 year ago Acute cystitis without hematuria   [facility] Glean [facility], MD   1 year ago Acute cystitis with hematuria   [facility] Glean Hess, MD      Future Appointments            Tomorrow Glean Hess, MD [place] - Completed PHQ-2 or PHQ-9 in the last 360 days      . hydrochlorothiazide (HYDRODIURIL) 25 MG tablet [Pharmacy Med Name: hydroCHLOROthiazide 25 MG Oral Tablet] 90 tablet 0    Sig: TAKE 1 TABLET BY MOUTH  DAILY     Cardiovascular: Diuretics - Thiazide Failed - 04/26/2021  5:46 AM      Failed - Cr in normal range and within 360 days    Creatinine, Ser  Date Value Ref Range Status  05/02/2020 1.24 (H) 0.57 - 1.00 mg/dL Final         Failed - Valid encounter within last 6 months    Recent Outpatient Visits          8 months ago Essential (primary) hypertension   [facility] Glean Hess, MD   11 months ago Type II diabetes mellitus with complication [facility])   [facility] Glean Hess, MD   1 year ago Annual physical exam   [facility] Glean Hess, MD   1 year ago Acute cystitis without hematuria   [facility] Glean Hess, MD   1 year ago Acute cystitis with hematuria   [facility] Glean [facility], MD      Future Appointments            Tomorrow Glean Hess, MD [facility] in normal range and within 360 days    Calcium  Date Value Ref Range Status  05/02/2020 9.5 8.7 - 10.3 mg/dL Final         Passed - K in normal range and within 360 days    Potassium  Date Value Ref Range Status  05/02/2020 4.4 3.5 - 5.2 mmol/L Final         Passed - Na in normal range and within 360 days    Sodium  Date Value Ref Range Status  05/02/2020 138 134 - 144 mmol/L Final         Passed - Last BP in normal range    BP Readings from Last 1 Encounters:  08/20/20 128/70         . enalapril (VASOTEC) 5 MG tablet [Pharmacy Med  Name: ENALAPRIL  5MG   TAB] 180 tablet 0    Sig: TAKE 1 TABLET BY MOUTH  TWICE DAILY     Cardiovascular:  ACE Inhibitors Failed - 04/26/2021  5:46 AM      Failed - Cr in normal range and within 180 days    Creatinine, Ser  Date Value Ref Range Status  05/02/2020 1.24 (H) 0.57 - 1.00 mg/dL Final         Failed - K in normal range and within 180 days    Potassium  Date Value Ref Range Status  05/02/2020 4.4 3.5 - 5.2 mmol/L Final         Failed - Valid encounter within last 6 months    Recent Outpatient Visits          8 months ago Essential (primary) hypertension   [facility] Glean [facility], MD   11 months ago Type II diabetes mellitus with complication [facility])   [facility] Glean Hess, MD   1 year ago Annual physical exam   [facility] Glean Hess, MD   1 year ago Acute cystitis without hematuria   [facility] Glean Hess, MD   1 year ago Acute cystitis with hematuria   [place], Laura H, MD      Future Appointments            Tomorrow Glean Hess, MD [facility] [place] - Patient is not pregnant      Passed - Last BP in normal range    BP Readings from Last 1  Encounters:  08/20/20 128/70         . amLODipine (NORVASC) 2.5 MG tablet [Pharmacy Med Name: amLODIPine Besylate 2.5 MG Oral Tablet] 90 tablet 0    Sig: TAKE 1 TABLET BY MOUTH  DAILY     Cardiovascular:  Calcium Channel Blockers Failed - 04/26/2021  5:46 AM      Failed - Valid encounter within last 6 months    Recent Outpatient Visits          8 months ago Essential (primary) hypertension   [facility] Glean Hess, MD   11 months ago Type II diabetes mellitus with complication [facility])   [facility] Glean Hess, MD   1 year ago Annual physical exam   [facility] Glean Hess, MD   1 year ago Acute cystitis without hematuria   [facility] Glean Hess, MD   1 year ago Acute cystitis with hematuria   [facility] Glean Hess, MD      Future Appointments            Tomorrow Glean Hess, MD [facility] [place] [place] BP in normal range    BP Readings from Last 1 Encounters:  08/20/20 128/70

## 2021-04-27 ENCOUNTER — Ambulatory Visit (INDEPENDENT_AMBULATORY_CARE_PROVIDER_SITE_OTHER): Payer: Medicare Other | Admitting: Internal Medicine

## 2021-04-27 ENCOUNTER — Other Ambulatory Visit: Payer: Self-pay

## 2021-04-27 ENCOUNTER — Ambulatory Visit: Payer: Medicare Other | Admitting: Internal Medicine

## 2021-04-27 ENCOUNTER — Encounter: Payer: Self-pay | Admitting: Internal Medicine

## 2021-04-27 VITALS — BP 120/78 | HR 79 | Temp 97.9°F | Resp 12 | Ht 66.0 in | Wt 171.0 lb

## 2021-04-27 DIAGNOSIS — E1169 Type 2 diabetes mellitus with other specified complication: Secondary | ICD-10-CM

## 2021-04-27 DIAGNOSIS — E785 Hyperlipidemia, unspecified: Secondary | ICD-10-CM | POA: Diagnosis not present

## 2021-04-27 DIAGNOSIS — Z1231 Encounter for screening mammogram for malignant neoplasm of breast: Secondary | ICD-10-CM

## 2021-04-27 DIAGNOSIS — E118 Type 2 diabetes mellitus with unspecified complications: Secondary | ICD-10-CM

## 2021-04-27 DIAGNOSIS — I10 Essential (primary) hypertension: Secondary | ICD-10-CM | POA: Diagnosis not present

## 2021-04-27 DIAGNOSIS — F3341 Major depressive disorder, recurrent, in partial remission: Secondary | ICD-10-CM

## 2021-04-27 NOTE — Patient Instructions (Signed)
Angela Rose [id number]

## 2021-04-27 NOTE — Progress Notes (Signed)
Date:  04/27/2021   Name:  Angela Rose   DOB:  03/15/1946   MRN:  [id number]   Chief Complaint: Diabetes (Foot Exam. ) and Hypertension  Hypertension This is a chronic problem. The problem is controlled. Pertinent negatives include no chest pain, headaches, palpitations or shortness of breath. Past treatments include ACE inhibitors, diuretics and calcium channel blockers. The current treatment provides significant improvement.  Diabetes She presents for her follow-up diabetic visit. She has type 2 diabetes mellitus. Pertinent negatives for hypoglycemia include no headaches, nervousness/anxiousness or tremors. Associated symptoms include fatigue. Pertinent negatives for diabetes include no chest pain, no polydipsia and no polyuria. Current diabetic treatment includes insulin injections (metformin, glimepiride and study med). An ACE inhibitor/angiotensin II receptor blocker is being taken.  Hyperlipidemia This is a chronic problem. The problem is controlled. Associated symptoms include myalgias. Pertinent negatives include no chest pain or shortness of breath. Current antihyperlipidemic treatment includes statins. The current treatment provides significant improvement of lipids.  In a study of oral semaglutide but is not getting any meds.  Lab Results  Component Value Date   CREATININE 1.24 (H) 05/02/2020   BUN 30 (H) 05/02/2020   NA 138 05/02/2020   K 4.4 05/02/2020   CL 101 05/02/2020   CO2 22 05/02/2020   Lab Results  Component Value Date   CHOL 182 07/24/2020   HDL 46 07/24/2020   LDLCALC 110 07/24/2020   TRIG 129 07/24/2020   CHOLHDL 3.7 12/25/2019   Lab Results  Component Value Date   TSH 1.850 12/25/2019   Lab Results  Component Value Date   HGBA1C 7.7 08/12/2020   Lab Results  Component Value Date   WBC 6.1 12/25/2019   HGB 12.0 12/25/2019   HCT 37.4 12/25/2019   MCV 88 12/25/2019   PLT 292 12/25/2019   Lab Results  Component Value Date   ALT 8  07/24/2020   AST 16 07/24/2020   ALKPHOS 82 07/24/2020   BILITOT 0.4 05/02/2020     Review of Systems  Constitutional:  Positive for fatigue. Negative for appetite change, fever and unexpected weight change.  HENT:  Negative for tinnitus and trouble swallowing.   Eyes:  Negative for visual disturbance.  Respiratory:  Negative for cough, chest tightness and shortness of breath.   Cardiovascular:  Negative for chest pain, palpitations and leg swelling.  Gastrointestinal:  Negative for abdominal pain.  Endocrine: Negative for polydipsia and polyuria.  Genitourinary:  Negative for dysuria and hematuria.  Musculoskeletal:  Positive for myalgias. Negative for arthralgias.  Neurological:  Negative for tremors, numbness and headaches.  Psychiatric/Behavioral:  Positive for decreased concentration, dysphoric mood and sleep disturbance. The patient is not nervous/anxious.    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   Other tear of medial meniscus, current injury, left knee, initial encounter 12/31/2015   Type II diabetes mellitus with complication ([place]) 77/82/4235   Hyperlipidemia associated with type 2 diabetes mellitus ([place]) 08/14/2015   Coronary artery disease involving native coronary artery of native heart without angina pectoris 08/14/2015   Basal cell carcinoma 08/11/2015   History of melanoma 08/11/2015   Major depression in partial remission ([place]) 08/08/2015   Non-proliferative diabetic retinopathy, both eyes ([place]) 08/08/2015   History of colon polyps 08/08/2015   Hypothyroidism, postablative  08/08/2015   Carotid artery plaque 09/03/2014   Essential (primary) hypertension 09/03/2014   APC (atrial premature contractions) 09/03/2014    Allergies  Allergen Reactions    Propoxyphene Nausea And Vomiting    Other reaction(s): Vomiting   Lidocaine-Epinephrine     Other reaction(s): Other (See Comments) palpitatins and chest pain   Other Other (See Comments)    sutures - in her hand-very irritated   Tape Rash    BANDAIDS. BANDAIDS   Xylocaine  [Lidocaine Hcl]     Other reaction(s): Other (See Comments) palpitatins and chest pain   Jardiance [Empagliflozin] Other (See Comments)    Recurrent vaginal infections    Past Surgical History:  Procedure Laterality Date   ABDOMINAL HYSTERECTOMY     APPENDECTOMY     BASAL CELL CARCINOMA EXCISION     BREAST BIOPSY Right    needle bx-neg   CATARACT EXTRACTION, BILATERAL Bilateral 11/23/2018   CORONARY ARTERY BYPASS GRAFT  1996   x 3   MELANOMA EXCISION     PARTIAL HYSTERECTOMY  1976   ovaries remain    Social History   Tobacco Use   Smoking status: Former    Types: Cigarettes    Quit date: 06/11/1982    Years since quitting: 38.9   Smokeless tobacco: Never   Tobacco comments:    smoking cessation materials not required  Vaping Use   Vaping Use: Never used  Substance Use Topics   Alcohol use: Yes    Alcohol/week: 0.0 standard drinks    Comment: rare 1 wine glass of wine   Drug use: No     Medication list has been reviewed and updated.  Current Meds  Medication Sig   amLODipine (NORVASC) 2.5 MG tablet TAKE 1 TABLET BY MOUTH  DAILY   aspirin 81 MG tablet Take by mouth every evening.    BD PEN NEEDLE NANO U/F 32G X 4 MM MISC USE 1  TWICE DAILY   betamethasone dipropionate 0.05 % lotion SMARTSIG:Sparingly Topical 1 to 2 Times Daily PRN   Cyanocobalamin 5000 MCG LOZG Take 1 tablet by mouth daily.   enalapril (VASOTEC) 5 MG tablet TAKE 1 TABLET BY MOUTH  TWICE DAILY   FLUoxetine (PROZAC) 40 MG capsule TAKE 1 CAPSULE BY MOUTH  DAILY   glimepiride (AMARYL) 2 MG tablet Take 1 tablet (2 mg total) by mouth daily.   glucose blood test strip Frequency:PHARMDIR   Dosage:0.0     Instructions:   Note:testing 3-4xqd, DX Code 250.00 Dose: 1   hydrochlorothiazide (HYDRODIURIL) 25 MG tablet TAKE 1 TABLET BY MOUTH  DAILY   LEVEMIR FLEXTOUCH 100 UNIT/ML FlexPen INJECT 30 UNITS SUBCUTANEOUSLY TWICE DAILY   levothyroxine (SYNTHROID) 50 MCG tablet TAKE 1 TABLET BY MOUTH  DAILY BEFORE BREAKFAST   meloxicam (MOBIC) 15 MG tablet Take 1 tablet (15 mg total) by mouth daily.   metFORMIN (GLUCOPHAGE-XR) 500 MG 24 hr tablet Take 2 tablets (1,000 mg total) by mouth 2 (two) times daily.   rosuvastatin (CRESTOR) 5 MG tablet TAKE 1 TABLET BY MOUTH  DAILY    PHQ 2/9 Scores 04/27/2021 08/20/2020 05/02/2020 12/25/2019  PHQ - 2 Score 6 2 2 2   PHQ- 9 Score 13 2 3 3     GAD 7 : Generalized Anxiety Score 04/27/2021 08/20/2020 05/02/2020  Nervous, Anxious, on Edge 0 1 0  Control/stop worrying 0 1 0  Worry too much - different things 0 1 0  Trouble relaxing 0 0 0  Restless 0 0 0  Easily annoyed or irritable 0 0 0  Afraid - awful might happen 0 0 0  Total GAD 7 Score 0 3 0  Anxiety Difficulty Not difficult at all Not difficult at all Not difficult at all    BP Readings from Last 3 Encounters:  04/27/21 120/78  08/20/20 128/70  05/02/20 (!) 130/56    Physical Exam Vitals and nursing note reviewed.  Constitutional:      General: She is not in acute distress.    Appearance: She is well-developed.  HENT:     Head: Normocephalic and atraumatic.     Right Ear: Tympanic membrane normal.     Left Ear: Tympanic membrane normal.  Neck:     Vascular: No carotid bruit.  Cardiovascular:     Rate and Rhythm: Normal rate and regular rhythm.     Pulses: Normal pulses.     Heart sounds: No murmur heard. Pulmonary:     Effort: Pulmonary effort is normal. No respiratory distress.  Musculoskeletal:     Cervical back: Normal range of motion.     Right lower leg: No edema.     Left lower leg: No edema.  Lymphadenopathy:     Cervical: No cervical adenopathy.  Skin:    General: Skin is warm and dry.      Capillary Refill: Capillary refill takes less than 2 seconds.     Findings: No rash.  Neurological:     General: No focal deficit present.     Mental Status: She is alert and oriented to person, place, and time.     Gait: Gait normal.  Psychiatric:        Mood and Affect: Mood normal.        Behavior: Behavior normal.    Wt Readings from Last 3 Encounters:  04/27/21 171 lb (77.6 kg)  08/20/20 169 lb (76.7 kg)  05/02/20 171 lb (77.6 kg)    BP 120/78 (BP Location: Right Arm, Patient Position: Sitting, Cuff Size: Normal)   Pulse 79   Temp 97.9 F (36.6 C) (Oral)   Resp 12   Ht 5\' 6"  (1.676 m)   Wt 171 lb (77.6 kg)   SpO2 98%   BMI 27.60 kg/m   Assessment and Plan: 1. Type II diabetes mellitus with complication (HCC) Clinically stable by exam and report without s/s of hypoglycemia. DM complicated by hypertension and dyslipidemia. Tolerating medications well without side effects or other concerns. - Comprehensive metabolic panel - Hemoglobin A1c - TSH  2. Hyperlipidemia associated with type 2 diabetes mellitus ([place]) Tolerating statin medication without side effects at this time LDL goal of < 70.  On Crestor 5 mg. Due for lipid panel. Continue same therapy without change at this time. - Lipid panel  3. Essential (primary) hypertension Clinically stable exam with well controlled BP. Tolerating medications without side effects at this time. Pt to continue current regimen and low sodium diet; benefits of regular exercise as able discussed. - CBC with Differential/Platelet  4. Encounter for screening mammogram for breast cancer Schedule at [facility]; Future  5. Recurrent major depressive disorder, in partial remission (HCC) Clinically stable on current regimen with fair control of symptoms, No SI or HI. Higher screening scores due to recent covid vaccine and associated fatigue, etc. Will continue current therapy with  Prozac.    Partially dictated using Editor, commissioning. Any errors are unintentional.  Halina Maidens, MD [facility]  04/27/2021

## 2021-04-28 LAB — CBC WITH DIFFERENTIAL/PLATELET
Basophils Absolute: 0.1 10*3/uL (ref 0.0–0.2)
Basos: 1 %
EOS (ABSOLUTE): 0.2 10*3/uL (ref 0.0–0.4)
Eos: 3 %
Hematocrit: 40 % (ref 34.0–46.6)
Hemoglobin: 13.1 g/dL (ref 11.1–15.9)
Immature Grans (Abs): 0 10*3/uL (ref 0.0–0.1)
Immature Granulocytes: 0 %
Lymphocytes Absolute: 1 10*3/uL (ref 0.7–3.1)
Lymphs: 14 %
MCH: 28.9 pg (ref 26.6–33.0)
MCHC: 32.8 g/dL (ref 31.5–35.7)
MCV: 88 fL (ref 79–97)
Monocytes Absolute: 0.9 10*3/uL (ref 0.1–0.9)
Monocytes: 13 %
Neutrophils Absolute: 4.7 10*3/uL (ref 1.4–7.0)
Neutrophils: 69 %
Platelets: 263 10*3/uL (ref 150–450)
RBC: 4.54 x10E6/uL (ref 3.77–5.28)
RDW: 13.5 % (ref 11.7–15.4)
WBC: 6.9 10*3/uL (ref 3.4–10.8)

## 2021-04-28 LAB — COMPREHENSIVE METABOLIC PANEL
ALT: 18 IU/L (ref 0–32)
AST: 26 IU/L (ref 0–40)
Albumin/Globulin Ratio: 1.4 (ref 1.2–2.2)
Albumin: 4.2 g/dL (ref 3.7–4.7)
Alkaline Phosphatase: 83 IU/L (ref 44–121)
BUN/Creatinine Ratio: 17 (ref 12–28)
BUN: 20 mg/dL (ref 8–27)
Bilirubin Total: 0.3 mg/dL (ref 0.0–1.2)
CO2: 21 mmol/L (ref 20–29)
Calcium: 9.6 mg/dL (ref 8.7–10.3)
Chloride: 95 mmol/L — ABNORMAL LOW (ref 96–106)
Creatinine, Ser: 1.2 mg/dL — ABNORMAL HIGH (ref 0.57–1.00)
Globulin, Total: 3 g/dL (ref 1.5–4.5)
Glucose: 259 mg/dL — ABNORMAL HIGH (ref 65–99)
Potassium: 4.4 mmol/L (ref 3.5–5.2)
Sodium: 135 mmol/L (ref 134–144)
Total Protein: 7.2 g/dL (ref 6.0–8.5)
eGFR: 47 mL/min/{1.73_m2} — ABNORMAL LOW (ref >59)

## 2021-04-28 LAB — HEMOGLOBIN A1C
Est. average glucose Bld gHb Est-mCnc: 209 mg/dL
Hgb A1c MFr Bld: 8.9 % — ABNORMAL HIGH (ref 4.8–5.6)

## 2021-04-28 LAB — LIPID PANEL
Chol/HDL Ratio: 4.4 ratio (ref 0.0–4.4)
Cholesterol, Total: 192 mg/dL (ref 100–199)
HDL: 44 mg/dL (ref >39)
LDL Chol Calc (NIH): 108 mg/dL — ABNORMAL HIGH (ref 0–99)
Triglycerides: 235 mg/dL — ABNORMAL HIGH (ref 0–149)
VLDL Cholesterol Cal: 40 mg/dL (ref 5–40)

## 2021-04-28 LAB — TSH: TSH: 6.14 u[IU]/mL — ABNORMAL HIGH (ref 0.450–4.500)

## 2021-04-29 ENCOUNTER — Other Ambulatory Visit: Payer: Self-pay

## 2021-04-29 DIAGNOSIS — E890 Postprocedural hypothyroidism: Secondary | ICD-10-CM

## 2021-04-29 MED ORDER — FLUCONAZOLE 150 MG PO TABS: 150.0000 mg | ORAL_TABLET | Freq: Once | ORAL | 1 refills | Status: AC

## 2021-05-29 ENCOUNTER — Other Ambulatory Visit: Payer: Self-pay

## 2021-05-29 ENCOUNTER — Other Ambulatory Visit: Payer: Self-pay | Admitting: Internal Medicine

## 2021-05-29 DIAGNOSIS — E890 Postprocedural hypothyroidism: Secondary | ICD-10-CM

## 2021-05-29 DIAGNOSIS — E113299 Type 2 diabetes mellitus with mild nonproliferative diabetic retinopathy without macular edema, unspecified eye: Secondary | ICD-10-CM

## 2021-05-29 MED ORDER — LEVOTHYROXINE SODIUM 50 MCG PO TABS
50.0000 ug | ORAL_TABLET | Freq: Every day | ORAL | 0 refills | Status: DC
Start: 2021-05-29 — End: 2021-08-31

## 2021-05-29 MED ORDER — LEVEMIR FLEXTOUCH 100 UNIT/ML ~~LOC~~ SOPN: PEN_INJECTOR | SUBCUTANEOUS | 2 refills | Status: DC

## 2021-05-29 NOTE — Telephone Encounter (Signed)
   Notes to clinic:  Patient has appt on 09/02/2021 Review for refill until that time is appropriate    Requested Prescriptions  Pending Prescriptions Disp Refills   levothyroxine (SYNTHROID) 50 MCG tablet [Pharmacy Med Name: Levothyroxine Sodium 50 MCG Oral Tablet] 90 tablet 3    Sig: TAKE 1 TABLET BY MOUTH  DAILY BEFORE BREAKFAST     Endocrinology:  Hypothyroid Agents Failed - 05/29/2021 11:29 AM      Failed - TSH needs to be rechecked within 3 months after an abnormal result. Refill until TSH is due.      Failed - TSH in normal range and within 360 days    TSH  Date Value Ref Range Status  04/27/2021 6.140 (H) 0.450 - 4.500 uIU/mL Final          Passed - Valid encounter within last 12 months    Recent Outpatient Visits           1 month ago Type II diabetes mellitus with complication [facility])   [facility]an Hess, [facility]   9 months ago Essential (primary) hypertension   [facility] Glean Hess, MD   1 year ago Type II diabetes mellitus with complication [facility])   [facility] Glean Hess, MD   1 year ago Annual physical exam   [facility] Glean Hess, MD   1 year ago Acute cystitis without hematuria   [facility] Glean Hess, MD       Future Appointments             In 3 months [name] [name], MD [facility], [facility]

## 2021-05-29 NOTE — Telephone Encounter (Signed)
Pt called saying she is completely out of the Levothyroxine and wants to know if an order can be sent to [facility] in [place].   She does not think the prescription will get here before next week from [place],  Pt also stated she needs the [place].  The pharmacy told her the rx has expired.  CB#  [phone]

## 2021-07-07 ENCOUNTER — Telehealth: Payer: Self-pay

## 2021-07-07 NOTE — Chronic Care Management (AMB) (Signed)
    Chronic Care Management Pharmacy Assistant   Name: Caycee Wanat  MRN: [id number] DOB: 06-10-1946   Reason for Encounter: Disease State General Assessment    Recent office visits:  04/27/21 Glean Hess MD - Seen for diabetes - Labs ordered - No medication changes - Follow up in 4 months  Recent consult visits:  02/26/21 Allayne Butcher - Seen for nail dystrophy - no other notes available   Hospital visits:  None in previous 6 months  Medications: Outpatient Encounter Medications as of 07/07/2021  Medication Sig   amLODipine (NORVASC) 2.5 MG tablet TAKE 1 TABLET BY MOUTH  DAILY   aspirin 81 MG tablet Take by mouth every evening.    BD PEN NEEDLE NANO U/F 32G X 4 MM MISC USE 1  TWICE DAILY   betamethasone dipropionate 0.05 % lotion SMARTSIG:Sparingly Topical 1 to 2 Times Daily PRN   Cyanocobalamin 5000 MCG LOZG Take 1 tablet by mouth daily.   enalapril (VASOTEC) 5 MG tablet TAKE 1 TABLET BY MOUTH  TWICE DAILY   FLUoxetine (PROZAC) 40 MG capsule TAKE 1 CAPSULE BY MOUTH  DAILY   glimepiride (AMARYL) 2 MG tablet Take 1 tablet (2 mg total) by mouth daily.   glucose blood test strip Frequency:PHARMDIR   Dosage:0.0     Instructions:  Note:testing 3-4xqd, DX Code 250.00 Dose: 1   hydrochlorothiazide (HYDRODIURIL) 25 MG tablet TAKE 1 TABLET BY MOUTH  DAILY   insulin detemir (LEVEMIR FLEXTOUCH) 100 UNIT/ML FlexPen INJECT 30 UNITS SUBCUTANEOUSLY TWICE DAILY   levothyroxine (SYNTHROID) 50 MCG tablet Take 1 tablet (50 mcg total) by mouth daily before breakfast.   meloxicam (MOBIC) 15 MG tablet Take 1 tablet (15 mg total) by mouth daily.   metFORMIN (GLUCOPHAGE-XR) 500 MG 24 hr tablet Take 2 tablets (1,000 mg total) by mouth 2 (two) times daily.   rosuvastatin (CRESTOR) 5 MG tablet TAKE 1 TABLET BY MOUTH  DAILY   No facility-administered encounter medications on file as of 07/07/2021.    Care Gaps: OPHTHALMOLOGY EXAM Last completed: Jul 24, 2019  Have you had any problems  recently with your health? Patient states that she has been doing well with her health and no concerns at this time.   Have you had any problems with your pharmacy? Patient states that she is unsure of where to get her levothyroxine. She states that she [facility] but had to get her last prescription from [place]. She states that she will call [place] to clarify where she should get her next prescription of levothyroxine.   What issues or side effects are you having with your medications? Patient states no side effects from medications.   What would you like me to pass along to [facility] Nasuti Potts,CPP for them to help you with?  N/a  What can we do to take care of you better?  N/a   Misc. Comment: Ms Matusik is a very pleasant woman. Patient states that she has moved to [place]. She is currently in the process of finding a new provider and will no longer be in the CCM program.   Star Rating Drugs: Enalapril 5 mg last fill 04/26/21 90 DS  Glimepiride 2 mg  last fill 05/07/20 90 DS  Levemir  last fill 05/29/21 26 DS  Metformin 500 mg  last fill 08/20/20 90 DS  Rosuvastatin 5 mg  last fill 02/01/2289 DS    Andee Poles, CMA

## 2021-07-13 ENCOUNTER — Other Ambulatory Visit: Payer: Self-pay | Admitting: Internal Medicine

## 2021-07-13 DIAGNOSIS — E118 Type 2 diabetes mellitus with unspecified complications: Secondary | ICD-10-CM

## 2021-07-13 DIAGNOSIS — I10 Essential (primary) hypertension: Secondary | ICD-10-CM

## 2021-07-14 NOTE — Telephone Encounter (Signed)
Requested medication (s) are due for refill today: Yes  Requested medication (s) are on the active medication list: Yes  Last refill:  08/20/20  Future visit scheduled: Yes  Notes to clinic:  Protocol indicates pt. Needs lab work.    Requested Prescriptions  Pending Prescriptions Disp Refills   metFORMIN (GLUCOPHAGE-XR) 500 MG 24 hr tablet [Pharmacy Med Name: metFORMIN HCl ER 500 MG Oral Tablet Extended Release 24 Hour] 360 tablet 3    Sig: TAKE 2 TABLETS BY MOUTH  TWICE DAILY     Endocrinology:  Diabetes - Biguanides Failed - 07/13/2021 10:53 PM      Failed - Cr in normal range and within 360 days    Creatinine, Ser  Date Value Ref Range Status  04/27/2021 1.20 (H) 0.57 - 1.00 mg/dL Final          Failed - HBA1C is between 0 and 7.9 and within 180 days    Hemoglobin A1C  Date Value Ref Range Status  08/12/2020 7.7  Final   Hgb A1c MFr Bld  Date Value Ref Range Status  04/27/2021 8.9 (H) 4.8 - 5.6 % Final    Comment:             Prediabetes: 5.7 - 6.4          Diabetes: >6.4          Glycemic control for adults with diabetes: <7.0           Failed - eGFR in normal range and within 360 days    GFR calc Af Amer  Date Value Ref Range Status  05/02/2020 49 (L) >59 mL/min/1.73 Final    Comment:    **[facility] currently reports eGFR in compliance with the current**   recommendations of the [employer]. [facility] will   update reporting as new guidelines are published from the NKF-ASN   Task force.    GFR calc non Af Amer  Date Value Ref Range Status  05/02/2020 42 (L) >59 mL/min/1.73 Final   eGFR  Date Value Ref Range Status  04/27/2021 47 (L) >59 mL/min/1.73 Final          Passed - Valid encounter within last 6 months    Recent Outpatient Visits           2 months ago Type II diabetes mellitus with complication [facility])   [facility]an [facility], MD   10 months ago Essential (primary) hypertension   [facility]  Glean Hess, MD   1 year ago Type II diabetes mellitus with complication [facility])   [facility] Glean Hess, MD   1 year ago Annual physical exam   [facility] Glean Hess, MD   1 year ago Acute cystitis without hematuria   [facility], MD       Future Appointments             In 1 month [name] [name], MD [facility], [facility]            Signed Prescriptions Disp Refills   FLUoxetine (PROZAC) 40 MG capsule 90 capsule 0    Sig: TAKE 1 CAPSULE BY MOUTH  DAILY     Psychiatry:  Antidepressants - SSRI Passed - 07/13/2021 10:53 PM      Passed - Completed PHQ-2 or PHQ-9 in the last 360 days      Passed - Valid encounter within last 6 months  Recent Outpatient Visits           2 months ago Type II diabetes mellitus with complication [facility])   [facility]   10 months ago Essential (primary) hypertension   [facility] Glean Hess, MD   1 year ago Type II diabetes mellitus with complication [facility])   [facility] Glean Hess, MD   1 year ago Annual physical exam   [facility] Glean Hess, MD   1 year ago Acute cystitis without hematuria   [facility] Glean Hess, MD       Future Appointments             In 1 month [name] [name], MD [facility], [facility]             enalapril (VASOTEC) 5 MG tablet 180 tablet 0    Sig: TAKE 1 TABLET BY MOUTH  TWICE DAILY     Cardiovascular:  ACE Inhibitors Failed - 07/13/2021 10:53 PM      Failed - Cr in normal range and within 180 days    Creatinine, Ser  Date Value Ref Range Status  04/27/2021 1.20 (H) 0.57 - 1.00 mg/dL Final          Passed - K in normal range and within 180 days    Potassium  Date Value Ref Range Status  04/27/2021 4.4 3.5 - 5.2 mmol/L Final          Passed - Patient is not pregnant      Passed - Last BP in normal range    BP  Readings from Last 1 Encounters:  04/27/21 120/78          Passed - Valid encounter within last 6 months    Recent Outpatient Visits           2 months ago Type II diabetes mellitus with complication [facility])   [facility] Glean Hess, MD   10 months ago Essential (primary) hypertension   [facility] Gle[facility], MD   1 year ago Type II diabetes mellitus with complication [facility])   [facility] Glean Hess, MD   1 year ago Annual physical exam   [facility] Glean Hess, MD   1 year ago Acute cystitis without hematuria   [facility] Glean Hess, MD       Future Appointments             In 1 month [name] [name], MD [facility]             hydrochlorothiazide (HYDRODIURIL) 25 MG tablet 90 tablet 0    Sig: TAKE 1 TABLET BY MOUTH  DAILY     Cardiovascular: Diuretics - Thiazide Failed - 07/13/2021 10:53 PM      Failed - Cr in normal range and within 360 days    Creatinine, Ser  Date Value Ref Range Status  04/27/2021 1.20 (H) 0.57 - 1.00 mg/dL Final          Passed - Ca in normal range and within 360 days    Calcium  Date Value Ref Range Status  04/27/2021 9.6 8.7 - 10.3 mg/dL Final          Passed - K in normal range and within 360 days    Potassium  Date Value Ref Range Status  04/27/2021 4.4 3.5 - 5.2  mmol/L Final          Passed - Na in normal range and within 360 days    Sodium  Date Value Ref Range Status  04/27/2021 135 134 - 144 mmol/L Final          Passed - Last BP in normal range    BP Readings from Last 1 Encounters:  04/27/21 120/78          Passed - Valid encounter within last 6 months    Recent Outpatient Visits           2 months ago Type II diabetes mellitus with complication [facility])   [facility] Glean [facility], MD   10 months ago Essential (primary) hypertension   [facility] Glean Hess, MD   1 year ago Type  II diabetes mellitus with complication [facility])   [facility] Glean Hess, MD   1 year ago Annual physical exam   [facility] Glean Hess, MD   1 year ago Acute cystitis without hematuria   [facility] Glean Hess, MD       Future Appointments             In 1 month Glean Hess, MD [facility]             amLODipine (NORVASC) 2.5 MG tablet 90 tablet 0    Sig: TAKE 1 TABLET BY MOUTH  DAILY     Cardiovascular:  Calcium Channel Blockers Passed - 07/13/2021 10:53 PM      Passed - Last BP in normal range    BP Readings from Last 1 Encounters:  04/27/21 120/78          Passed - Valid encounter within last 6 months    Recent Outpatient Visits           2 months ago Type II diabetes mellitus with complication [facility])   [facility] Glean Hess, MD   10 months ago Essential (primary) hypertension   [facility] Glean Hess, MD   1 year ago Type II diabetes mellitus with complication [facility])   [facility] Glean Hess, MD   1 year ago Annual physical exam   [facility] Glean Hess, MD   1 year ago Acute cystitis without hematuria   [facility] Glean Hess, MD       Future Appointments             In 1 month [name], [name], MD [facility], [facility]

## 2021-07-14 NOTE — Telephone Encounter (Signed)
MEDICATION REFILL 

## 2021-07-31 DIAGNOSIS — Z23 Encounter for immunization: Secondary | ICD-10-CM | POA: Diagnosis not present

## 2021-08-12 ENCOUNTER — Other Ambulatory Visit: Payer: Self-pay

## 2021-08-12 ENCOUNTER — Telehealth: Payer: Self-pay

## 2021-08-12 DIAGNOSIS — I4891 Unspecified atrial fibrillation: Secondary | ICD-10-CM

## 2021-08-12 NOTE — Telephone Encounter (Signed)
Copied from [place] [phone]. Topic: Referral - Request for Referral >> Aug 12, 2021  2:52 PM Oneta Rack wrote:  patient relocated to [place] and states PCP is aware and would like a referral sent to Janina Mayo, MD Cardiology.

## 2021-08-25 ENCOUNTER — Telehealth: Payer: Self-pay

## 2021-08-25 NOTE — Telephone Encounter (Signed)
Copied from [place] [id number]. Topic: General - Other >> Aug 25, 2021 11:31 AM Loma Boston wrote: [place] wanting EKG's etc records not in Epic faxed over to [phone] per Estill Bamberg (office)

## 2021-08-25 NOTE — Telephone Encounter (Signed)
Faxed stuff from 2020 for patients AFIB.

## 2021-08-31 ENCOUNTER — Other Ambulatory Visit: Payer: Self-pay | Admitting: Internal Medicine

## 2021-08-31 DIAGNOSIS — E890 Postprocedural hypothyroidism: Secondary | ICD-10-CM

## 2021-08-31 NOTE — Telephone Encounter (Signed)
Medication Refill - Medication: levothyroxine (SYNTHROID) 50 MCG tablet  Has the patient contacted their pharmacy? No.  Preferred Pharmacy (with phone number or street name): [place] [address] Has the patient been seen for an appointment in the last year OR does the patient have an upcoming appointment? Yes.    Agent: Please be advised that RX refills may take up to 3 business days. We ask that you follow-up with your pharmacy.

## 2021-08-31 NOTE — Telephone Encounter (Signed)
Requested medications are due for refill today.  yes  Requested medications are on the active medications list.  yes  Last refill. 05/29/2021  Future visit scheduled.   yes  Notes to clinic.  Failed protocol d/t abnormal labs.

## 2021-09-01 MED ORDER — LEVOTHYROXINE SODIUM 50 MCG PO TABS: 50.0000 ug | ORAL_TABLET | Freq: Every day | ORAL | 0 refills | Status: DC

## 2021-09-02 ENCOUNTER — Ambulatory Visit: Payer: Medicare Other | Admitting: Internal Medicine

## 2021-09-14 ENCOUNTER — Ambulatory Visit (INDEPENDENT_AMBULATORY_CARE_PROVIDER_SITE_OTHER): Payer: Medicare Other

## 2021-09-14 ENCOUNTER — Encounter: Payer: Self-pay | Admitting: Internal Medicine

## 2021-09-14 ENCOUNTER — Ambulatory Visit: Payer: Medicare Other | Admitting: Internal Medicine

## 2021-09-14 ENCOUNTER — Other Ambulatory Visit: Payer: Self-pay

## 2021-09-14 ENCOUNTER — Other Ambulatory Visit: Payer: Self-pay | Admitting: Internal Medicine

## 2021-09-14 VITALS — BP 150/80 | HR 78 | Ht 66.0 in | Wt 175.8 lb

## 2021-09-14 DIAGNOSIS — I493 Ventricular premature depolarization: Secondary | ICD-10-CM

## 2021-09-14 DIAGNOSIS — R9431 Abnormal electrocardiogram [ECG] [EKG]: Secondary | ICD-10-CM

## 2021-09-14 DIAGNOSIS — E890 Postprocedural hypothyroidism: Secondary | ICD-10-CM | POA: Diagnosis not present

## 2021-09-14 MED ORDER — LEVOTHYROXINE SODIUM 50 MCG PO TABS: 50.0000 ug | ORAL_TABLET | Freq: Every day | ORAL | 0 refills | Status: DC

## 2021-09-14 NOTE — Progress Notes (Unsigned)
Enrolled patient for a 14 day Zio XT Monitor to be mailed to patients home  

## 2021-09-14 NOTE — Patient Instructions (Signed)
Medication Instructions:  No Changes In Medications at this time.  *If you need a refill on your cardiac medications before your next appointment, please call your pharmacy*  Lab Work: PLEASE RETURN FOR FASTING BLOOD WORK NO APPOINTMENT NEEDED LAB HOURS ARE FROM 8am-4pm Monday-Friday. YOU CAN HAVE THIS DONE AT YOUR PRIMARY CARE PROVIDERS AS WELL.  If you have labs (blood work) drawn today and your tests are completely normal, you will receive your results only by: [place] (if you have MyChart) OR A paper copy in the mail If you have any lab test that is abnormal or we need to change your treatment, we will call you to review the results.  Testing/Procedures:  [facility] [place]- Long Term Monitor Instructions   Your physician has requested you wear your ZIO patch monitor 14 days.   This is a single patch monitor.  [employer] supplies one patch monitor per enrollment.  Additional stickers are not available.   Please do not apply patch if you will be having a Nuclear Stress Test, Echocardiogram, Cardiac CT, MRI, or Chest Xray during the time frame you would be wearing the monitor. The patch cannot be worn during these tests.  You cannot remove and re-apply the ZIO XT patch monitor.   Your ZIO patch monitor will be sent USPS Priority mail from [facility] directly to your home address. The monitor may also be mailed to a PO BOX if home delivery is not available.   It may take 3-5 days to receive your monitor after you have been enrolled.   Once you have received you monitor, please review enclosed instructions.  Your monitor has already been registered assigning a specific monitor serial # to you.   Applying the monitor   Shave hair from upper left chest.   Hold abrader disc by orange tab.  Rub abrader in 40 strokes over left upper chest as indicated in your monitor instructions.   Clean area with 4 enclosed alcohol pads .  Use all pads to assure are is cleaned thoroughly.  Let dry.    Apply patch as indicated in monitor instructions.  Patch will be place under collarbone on left side of chest with arrow pointing upward.   Rub patch adhesive wings for 2 minutes.Remove white label marked "1".  Remove white label marked "2".  Rub patch adhesive wings for 2 additional minutes.   While looking in a mirror, press and release button in center of patch.  A small green light will flash 3-4 times .  This will be your only indicator the monitor has been turned on.     Do not shower for the first 24 hours.  You may shower after the first 24 hours.   Press button if you feel a symptom. You will hear a small click.  Record Date, Time and Symptom in the Patient Log Book.   When you are ready to remove patch, follow instructions on last 2 pages of Patient Log Book.  Stick patch monitor onto last page of Patient Log Book.   Place Patient Log Book in [place] box.  Use locking tab on box and tape box closed securely.  The [employer] has [employer] on it.  Please place in mailbox as soon as possible.  Your physician should have your test results approximately 7 days after the monitor has been mailed back to [facility].   Call [place] at [phone] if you have questions regarding your ZIO XT patch  monitor.  Call them immediately if you see an orange light blinking on your monitor.   If your monitor falls off in less than 4 days contact our Monitor department at [phone].  If your monitor becomes loose or falls off after 4 days call [employer] at [phone] for suggestions on securing your monitor.    Follow-Up: At [facility], you and your health needs are our priority.  As part of our continuing mission to provide you with exceptional heart care, we have created designated Provider Care Teams.  These Care Teams include your primary Cardiologist (physician) and Advanced Practice Providers (APPs -  Physician Assistants and Nurse Practitioners) who  all work together to provide you with the care you need, when you need it.  Your next appointment:   3 month(s)  The format for your next appointment:   In Person  Provider:   Janina Mayo, MD

## 2021-09-14 NOTE — Progress Notes (Signed)
Cardiology Office Note:    Date:  09/14/2021   ID:  Angela Rose, DOB 12-Feb-1946, MRN [id number]  PCP:  Angela Hess, MD   [place] Providers Cardiologist:  None     Referring MD: Angela Hess, MD   No chief complaint on file. Establish Care  History of Present Illness:    Angela Rose is a 75 y.o. female with a hx of MI in [place], HTN, HLD, PAD, former smoker, carotid atherosclerosis, no hx of stroke, previously followed at [facility] w/ Dr. Nehemiah Rose when she lived in [place] she has since moved to [place]  referral to establish care here  In March 12/25/2018 she had EKG per report during an annual visit and it showed ?atrial fibrillation with a HR of 74. She has CHADS2VASC of 5. She was reluctant to start anticoagulation. She was started on ASA. She saw cardiology at [facility] 01/16/2019 who did an event monitor and it showed PACs, PVCs, asymptomatic bradycardia, sinus tachycardia but no atrial fibrillation. No captured data c/w atrial fibrillation in the cardiology notes.   She was noted to have hx of 3v CABG in 1996, she notes mild MI 1993 with stents.. She has hx of PVD and carotid atherosclerosis that is stable. She was started on ASA 81 mg daily and statin.She had a stress test that was normal 01/10/2019. She denies palpitations. No chest pain. No pnd , orthopnea, or LE edema. She notes she is fatigued. She's been under a lot of stress..   For her hypertension she is managed on norvasc 2.5 mg daily, HCTz 25 mg daily and enalapril 5 mg BID. Her blood pressure is well controlled per report, it's high today. Its typically 115-125 mg.   For HLD she's on crestor 5 mg daily he has T2DM  on metformin 500 mg BID Shes on jardiance.   She's here with husband from [place], now they are in [place].   Labs: 04/27/2021 LDL 108 TC 192 TSH 6.14 - planning to see endocrinology A1c 8.9%  TTE 07/23/2020 at [place] FUNCTION  WITH MILD LVH NORMAL RIGHT VENTRICULAR SYSTOLIC FUNCTION MILD VALVULAR REGURGITATION (See above) NO VALVULAR STENOSIS MILD MR, TR, PR EF 50%  Past Medical History:  Diagnosis Date   Anxiety    Cancer ([place])    melanoma face   Depression    Diabetes mellitus without complication ([place])    Fracture of tibial plateau 12/31/2015   Hyperlipidemia    Hypertension     Past Surgical History:  Procedure Laterality Date   ABDOMINAL HYSTERECTOMY     APPENDECTOMY     BASAL CELL CARCINOMA EXCISION     BREAST BIOPSY Right    needle bx-neg   CATARACT EXTRACTION, BILATERAL Bilateral 11/23/2018   CORONARY ARTERY BYPASS GRAFT  1996   x 3   MELANOMA EXCISION     PARTIAL HYSTERECTOMY  1976   ovaries remain    Current Medications: No outpatient medications have been marked as taking for the 09/14/21 encounter (Office Visit) with Janina Mayo, MD.     Allergies:   Propoxyphene, Lidocaine-epinephrine, Other, Tape, Xylocaine  [lidocaine hcl], and Jardiance [empagliflozin]   Social History   Socioeconomic History   Marital status: Significant Other    Spouse name: Not on file   Number of children: 3   Years of education: Not on file   Highest education level: Some college, no degree  Occupational History   Occupation:  retired  Tobacco Use   Smoking status: Former    Types: Cigarettes    Quit date: 06/11/1982    Years since quitting: 39.2   Smokeless tobacco: Never   Tobacco comments:    smoking cessation materials not required  Vaping Use   Vaping Use: Never used  Substance and Sexual Activity   Alcohol use: Yes    Alcohol/week: 0.0 standard drinks    Comment: rare 1 wine glass of wine   Drug use: No   Sexual activity: Not on file  Other Topics Concern   Not on file  Social History Narrative   Not on file   Social Determinants of Health   Financial Resource Strain: Not on file  Food Insecurity: Not on file  Transportation Needs: Not on file  Physical Activity: Not on  file  Stress: Not on file  Social Connections: Not on file     Family History: The patient's family history includes Basal cell carcinoma in her mother; CAD in her father; Heart disease in her father; Skin cancer in her mother; Squamous cell carcinoma in her mother. There is no history of Breast cancer.  ROS:   Please see the history of present illness.     All other systems reviewed and are negative.  EKGs/Labs/Other Studies Reviewed:    The following studies were reviewed today:   EKG:  EKG is  ordered today.  The ekg ordered today demonstrates   Sinus with PACs,1st degree AV block, PVC  Recent Labs: 04/27/2021: ALT 18; BUN 20; Creatinine, Ser 1.20; Hemoglobin 13.1; Platelets 263; Potassium 4.4; Sodium 135; TSH 6.140  Recent Lipid Panel    Component Value Date/Time   CHOL 192 04/27/2021 1029   TRIG 235 (H) 04/27/2021 1029   HDL 44 04/27/2021 1029   CHOLHDL 4.4 04/27/2021 1029   LDLCALC 108 (H) 04/27/2021 1029     Risk Assessment/Calculations:          Physical Exam:    VS:  There were no vitals taken for this visit.    Wt Readings from Last 3 Encounters:  04/27/21 171 lb (77.6 kg)  08/20/20 169 lb (76.7 kg)  05/02/20 171 lb (77.6 kg)     GEN:  Well nourished, well developed in no acute distress HEENT: Normal NECK: No JVD; No carotid bruits LYMPHATICS: No lymphadenopathy CARDIAC: RRR, no murmurs, rubs, gallops RESPIRATORY:  Clear to auscultation without rales, wheezing or rhonchi  ABDOMEN: Soft, non-tender, non-distended MUSCULOSKELETAL:  No edema; No deformity  SKIN: Warm and dry NEUROLOGIC:  Alert and oriented x 3 PSYCHIATRIC:  Normal affect   ASSESSMENT:    #?Atrial Fibrillation: She was noted in a PCP note to have it but has not been captured by cardiology. Will get an event monitor for 14 days. If no captured afib, will not plan for anticoagulation  #Ischemic Heart Dx: Hx of CABG per above. Low Normal EF. No hx of CHF. Stable. Continue SGLT2, asa  81 mg daily, and crestor 5 mg daily. Recheck lipids today, not at goal < 70. Not on BB , noted to have some bradycardia & tachy on holter possibly sss; will hold BB for now.  #HTN: continue Hctz 25 mg daily and norvasc 2.5 mg daily. Continue enalapril 5 mg daily.   #Elevated TSH: planned to see PCP. Will defer recheck of TSH + free T4 to adjust synthroid if needed. She does feel tired.  PLAN:    In order of problems listed above:  Lipid profile,  TSH + free T4 Refilled synthroid Follow up in 3 months Zio patch 14 days      Medication Adjustments/Labs and Tests Ordered: Current medicines are reviewed at length with the patient today.  Concerns regarding medicines are outlined above.   Signed, Janina Mayo, MD  09/14/2021 11:11 AM    [facility]

## 2021-09-22 ENCOUNTER — Other Ambulatory Visit: Payer: Self-pay

## 2021-09-22 ENCOUNTER — Ambulatory Visit (INDEPENDENT_AMBULATORY_CARE_PROVIDER_SITE_OTHER): Payer: Medicare Other | Admitting: Internal Medicine

## 2021-09-22 ENCOUNTER — Other Ambulatory Visit: Payer: Self-pay | Admitting: Internal Medicine

## 2021-09-22 ENCOUNTER — Encounter: Payer: Self-pay | Admitting: Internal Medicine

## 2021-09-22 VITALS — BP 136/78 | HR 72 | Ht 66.0 in | Wt 179.4 lb

## 2021-09-22 DIAGNOSIS — I10 Essential (primary) hypertension: Secondary | ICD-10-CM | POA: Diagnosis not present

## 2021-09-22 DIAGNOSIS — E1169 Type 2 diabetes mellitus with other specified complication: Secondary | ICD-10-CM | POA: Diagnosis not present

## 2021-09-22 DIAGNOSIS — E785 Hyperlipidemia, unspecified: Secondary | ICD-10-CM | POA: Diagnosis not present

## 2021-09-22 DIAGNOSIS — I493 Ventricular premature depolarization: Secondary | ICD-10-CM | POA: Diagnosis not present

## 2021-09-22 DIAGNOSIS — E118 Type 2 diabetes mellitus with unspecified complications: Secondary | ICD-10-CM | POA: Diagnosis not present

## 2021-09-22 DIAGNOSIS — R9431 Abnormal electrocardiogram [ECG] [EKG]: Secondary | ICD-10-CM | POA: Diagnosis not present

## 2021-09-22 LAB — POCT GLYCOSYLATED HEMOGLOBIN (HGB A1C): Hemoglobin A1C: 7.7 % — AB (ref 4.0–5.6)

## 2021-09-22 MED ORDER — ROSUVASTATIN CALCIUM 5 MG PO TABS: 5.0000 mg | ORAL_TABLET | Freq: Every day | ORAL | 0 refills | Status: DC

## 2021-09-22 MED ORDER — METFORMIN HCL ER 500 MG PO TB24
1000.0000 mg | ORAL_TABLET | Freq: Two times a day (BID) | ORAL | 0 refills | Status: DC
Start: 2021-09-22 — End: 2021-11-23

## 2021-09-22 NOTE — Patient Instructions (Signed)
Remain off of Glimepiride since A1C is improved. Continue Insulin 30 units twice a day.  Follow up here in 4 mnths unless established with new PCP.

## 2021-09-22 NOTE — Progress Notes (Signed)
Date:  09/22/2021   Name:  Angela Rose   DOB:  1946/09/06   MRN:  [id number]   Chief Complaint: Diabetes and Hypertension  Hypertension This is a chronic problem. The problem is controlled. Pertinent negatives include no chest pain, headaches, palpitations or shortness of breath. Past treatments include ACE inhibitors and diuretics.  Diabetes She presents for her follow-up diabetic visit. She has type 2 diabetes mellitus. Her disease course has been stable. Pertinent negatives for hypoglycemia include no headaches or tremors. Pertinent negatives for diabetes include no chest pain, no fatigue, no polydipsia and no polyuria. Symptoms are stable. Current diabetic treatment includes oral agent (dual therapy) and insulin injections (insulin increased to 40 units AM). She is following a generally healthy diet. Her breakfast blood glucose is taken between 6-7 am. Her breakfast blood glucose range is generally 90-110 mg/dl.   Lab Results  Component Value Date   NA 135 04/27/2021   K 4.4 04/27/2021   CO2 21 04/27/2021   GLUCOSE 259 (H) 04/27/2021   BUN 20 04/27/2021   CREATININE 1.20 (H) 04/27/2021   CALCIUM 9.6 04/27/2021   EGFR 47 (L) 04/27/2021   GFRNONAA 42 (L) 05/02/2020   Lab Results  Component Value Date   CHOL 192 04/27/2021   HDL 44 04/27/2021   LDLCALC 108 (H) 04/27/2021   TRIG 235 (H) 04/27/2021   CHOLHDL 4.4 04/27/2021   Lab Results  Component Value Date   TSH 6.140 (H) 04/27/2021   Lab Results  Component Value Date   HGBA1C 8.9 (H) 04/27/2021   Lab Results  Component Value Date   WBC 6.9 04/27/2021   HGB 13.1 04/27/2021   HCT 40.0 04/27/2021   MCV 88 04/27/2021   PLT 263 04/27/2021   Lab Results  Component Value Date   ALT 18 04/27/2021   AST 26 04/27/2021   ALKPHOS 83 04/27/2021   BILITOT 0.3 04/27/2021   No results found for: 25OHVITD2, 25OHVITD3, VD25OH   Review of Systems  Constitutional:  Negative for appetite change, chills, fatigue,  fever and unexpected weight change.  HENT:  Negative for tinnitus and trouble swallowing.   Eyes:  Negative for visual disturbance.  Respiratory:  Negative for cough, chest tightness and shortness of breath.   Cardiovascular:  Negative for chest pain, palpitations and leg swelling.  Gastrointestinal:  Negative for abdominal pain.  Endocrine: Negative for polydipsia and polyuria.  Genitourinary:  Negative for dysuria and hematuria.  Musculoskeletal:  Negative for arthralgias.  Neurological:  Negative for tremors, numbness and headaches.  Psychiatric/Behavioral:  Negative for dysphoric mood.    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   Other tear of medial meniscus, current injury, left knee, initial encounter 12/31/2015   Type II diabetes mellitus with complication ([place]) 79/39/0300   Hyperlipidemia associated with type 2 diabetes mellitus ([place]) 08/14/2015   Coronary artery disease involving native coronary artery of native heart without angina pectoris 08/14/2015   Basal cell carcinoma 08/11/2015   History of melanoma 08/11/2015   Major depression in partial remission ([place]) 08/08/2015   Non-proliferative diabetic retinopathy, both eyes ([place]) 08/08/2015   History of colon polyps 08/08/2015   Hypothyroidism, postablative 08/08/2015   Carotid artery plaque 09/03/2014   Essential (primary) hypertension 09/03/2014   APC (atrial premature contractions) 09/03/2014  Allergies  Allergen Reactions   Propoxyphene Nausea And Vomiting    Other reaction(s): Vomiting   Lidocaine-Epinephrine     Other reaction(s): Other (See Comments) palpitatins and chest pain   Other Other (See Comments)    sutures - in her hand-very irritated   Tape Rash    BANDAIDS. BANDAIDS   Xylocaine  [Lidocaine  Hcl]     Other reaction(s): Other (See Comments) palpitatins and chest pain   Jardiance [Empagliflozin] Other (See Comments)    Recurrent vaginal infections    Past Surgical History:  Procedure Laterality Date   ABDOMINAL HYSTERECTOMY     APPENDECTOMY     BASAL CELL CARCINOMA EXCISION     BREAST BIOPSY Right    needle bx-neg   CATARACT EXTRACTION, BILATERAL Bilateral 11/23/2018   CORONARY ARTERY BYPASS GRAFT  1996   x 3   MELANOMA EXCISION     PARTIAL HYSTERECTOMY  1976   ovaries remain    Social History   Tobacco Use   Smoking status: Former    Types: Cigarettes    Quit date: 06/11/1982    Years since quitting: 39.3   Smokeless tobacco: Never   Tobacco comments:    smoking cessation materials not required  Vaping Use   Vaping Use: Never used  Substance Use Topics   Alcohol use: Yes    Alcohol/week: 0.0 standard drinks    Comment: rare 1 wine glass of wine   Drug use: No     Medication list has been reviewed and updated.  Current Meds  Medication Sig   amLODipine (NORVASC) 2.5 MG tablet TAKE 1 TABLET BY MOUTH  DAILY   aspirin 81 MG tablet Take by mouth every evening.    BD PEN NEEDLE NANO U/F 32G X 4 MM MISC USE 1  TWICE DAILY   betamethasone dipropionate 0.05 % lotion SMARTSIG:Sparingly Topical 1 to 2 Times Daily PRN   Cyanocobalamin 5000 MCG LOZG Take 1 tablet by mouth daily.   enalapril (VASOTEC) 5 MG tablet TAKE 1 TABLET BY MOUTH  TWICE DAILY   FLUoxetine (PROZAC) 40 MG capsule TAKE 1 CAPSULE BY MOUTH  DAILY   glucose blood test strip Frequency:PHARMDIR   Dosage:0.0     Instructions:  Note:testing 3-4xqd, DX Code 250.00 Dose: 1   hydrochlorothiazide (HYDRODIURIL) 25 MG tablet TAKE 1 TABLET BY MOUTH  DAILY   insulin detemir (LEVEMIR FLEXTOUCH) 100 UNIT/ML FlexPen INJECT 30 UNITS SUBCUTANEOUSLY TWICE DAILY   levothyroxine (SYNTHROID) 50 MCG tablet Take 1 tablet (50 mcg total) by mouth daily before breakfast.   meloxicam (MOBIC) 15 MG tablet Take 1 tablet (15  mg total) by mouth daily.   metFORMIN (GLUCOPHAGE-XR) 500 MG 24 hr tablet Take 2 tablets (1,000 mg total) by mouth 2 (two) times daily.   rosuvastatin (CRESTOR) 5 MG tablet TAKE 1 TABLET BY MOUTH  DAILY    PHQ 2/9 Scores 09/22/2021 04/27/2021 08/20/2020 05/02/2020  PHQ - 2 Score 0 _0 PHQ- 9 Score _1 GAD 7 : Generalized Anxiety Score 09/22/2021 04/27/2021 08/20/2020 05/02/2020  Nervous, Anxious, on Edge 0 0 1 0  Control/stop worrying 0 0 1 0  Worry too much - different things 0 0 1 0  Trouble relaxing 0 0 0 0  Restless 0 0 0 0  Easily annoyed or irritable 0 0 0 0  Afraid - awful might happen 0 0 0 0  Total GAD 7 Score 0 0 3 0  Anxiety  Difficulty Not difficult at all Not difficult at all Not difficult at all Not difficult at all    BP Readings from Last 3 Encounters:  09/22/21 136/78  09/14/21 (!) 150/80  04/27/21 120/78    Physical Exam Vitals and nursing note reviewed.  Constitutional:      General: She is not in acute distress.    Appearance: She is well-developed.  HENT:     Head: Normocephalic and atraumatic.  Cardiovascular:     Rate and Rhythm: Normal rate and regular rhythm.     Pulses: Normal pulses.  Pulmonary:     Effort: Pulmonary effort is normal. No respiratory distress.     Breath sounds: No wheezing or rhonchi.  Musculoskeletal:     Cervical back: Normal range of motion.     Right lower leg: No edema.     Left lower leg: No edema.  Lymphadenopathy:     Cervical: No cervical adenopathy.  Skin:    General: Skin is warm and dry.     Capillary Refill: Capillary refill takes less than 2 seconds.     Findings: No rash.  Neurological:     Mental Status: She is alert and oriented to person, place, and time.  Psychiatric:        Mood and Affect: Mood normal.        Behavior: Behavior normal.    Wt Readings from Last 3 Encounters:  09/22/21 179 lb 6.4 oz (81.4 kg)  09/14/21 175 lb 12.8 oz (79.7 kg)  04/27/21 171 lb (77.6 kg)    BP 136/78     Pulse 72    Ht _0  (1.676 m)    Wt 179 lb 6.4 oz (81.4 kg)    SpO2 96%    BMI 28.96 kg/m   Assessment and Plan: 1. Essential (primary) hypertension Clinically stable exam with well controlled BP. Tolerating medications without side effects at this time. Pt to continue current regimen and low sodium diet; benefits of regular exercise as able discussed.  2. Type II diabetes mellitus with complication ([place]) Clinically stable by exam and report without s/s of hypoglycemia. DM complicated by hypertension and dyslipidemia. Tolerating medications well without side effects or other concerns. A1C improved despite stopping Glimepiride.  Continue Insulin 30 units bid. - POCT glycosylated hemoglobin (Hb A1C) - Microalbumin / creatinine urine ratio   Partially dictated using Editor, commissioning. Any errors are unintentional.  Halina Maidens, MD [facility]  09/22/2021

## 2021-09-23 LAB — MICROALBUMIN / CREATININE URINE RATIO
Creatinine, Urine: 40.9 mg/dL
Microalb/Creat Ratio: 98 mg/g creat — ABNORMAL HIGH (ref 0–29)
Microalbumin, Urine: 40 ug/mL

## 2021-09-25 ENCOUNTER — Other Ambulatory Visit: Payer: Self-pay | Admitting: Internal Medicine

## 2021-09-25 DIAGNOSIS — E113299 Type 2 diabetes mellitus with mild nonproliferative diabetic retinopathy without macular edema, unspecified eye: Secondary | ICD-10-CM

## 2021-09-26 NOTE — Telephone Encounter (Signed)
Requested Prescriptions  Pending Prescriptions Disp Refills   LEVEMIR FLEXTOUCH 100 UNIT/ML FlexTouch Pen [Pharmacy Med Name: Levemir FlexTouch 100 UNIT/ML Subcutaneous Solution Pen-injector] 45 mL 5    Sig: INJECT 30 UNITS  SUBCUTANEOUSLY TWICE DAILY     Endocrinology:  Diabetes - Insulins Passed - 09/25/2021  6:05 PM      Passed - HBA1C is between 0 and 7.9 and within 180 days    Hemoglobin A1C  Date Value Ref Range Status  09/22/2021 7.7 (A) 4.0 - 5.6 % Final    Comment:    CM ran test KP entered  08/12/2020 7.7  Final   Hgb A1c MFr Bld  Date Value Ref Range Status  04/27/2021 8.9 (H) 4.8 - 5.6 % Final    Comment:             Prediabetes: 5.7 - 6.4          Diabetes: >6.4          Glycemic control for adults with diabetes: <7.0          Passed - Valid encounter within last 6 months    Recent Outpatient Visits          4 days ago Essential (primary) hypertension   [facility] Glean Hess, MD   5 months ago Type II diabetes mellitus with complication [facility])   [facility] Glean Hess, MD   1 year ago Essential (primary) hypertension   [facility] Glean Hess, MD   1 year ago Type II diabetes mellitus with complication [facility])   [facility] Glean Hess, MD   1 year ago Annual physical exam   [facility] Glean Hess, MD      Future Appointments            In 1 month Paseda, Dewaine Conger, FNP [facility]   In 2 months Branch, Royetta Crochet, MD [place], [facility]   In 5 months [name], [name], MD [facility], [place]

## 2021-09-30 DIAGNOSIS — I4891 Unspecified atrial fibrillation: Secondary | ICD-10-CM | POA: Diagnosis not present

## 2021-09-30 DIAGNOSIS — R9431 Abnormal electrocardiogram [ECG] [EKG]: Secondary | ICD-10-CM | POA: Diagnosis not present

## 2021-10-01 LAB — T4, FREE: Free T4: 1.35 ng/dL (ref 0.82–1.77)

## 2021-10-01 LAB — TSH: TSH: 1.99 u[IU]/mL (ref 0.450–4.500)

## 2021-10-01 LAB — LIPID PANEL
Chol/HDL Ratio: 3.2 ratio (ref 0.0–4.4)
Cholesterol, Total: 175 mg/dL (ref 100–199)
HDL: 54 mg/dL (ref >39)
LDL Chol Calc (NIH): 104 mg/dL — ABNORMAL HIGH (ref 0–99)
Triglycerides: 93 mg/dL (ref 0–149)
VLDL Cholesterol Cal: 17 mg/dL (ref 5–40)

## 2021-10-02 ENCOUNTER — Other Ambulatory Visit: Payer: Self-pay

## 2021-10-02 DIAGNOSIS — E1169 Type 2 diabetes mellitus with other specified complication: Secondary | ICD-10-CM

## 2021-10-02 MED ORDER — ROSUVASTATIN CALCIUM 20 MG PO TABS: 20.0000 mg | ORAL_TABLET | Freq: Every day | ORAL | 3 refills | Status: DC

## 2021-10-06 ENCOUNTER — Other Ambulatory Visit: Payer: Self-pay | Admitting: Internal Medicine

## 2021-10-06 DIAGNOSIS — R9431 Abnormal electrocardiogram [ECG] [EKG]: Secondary | ICD-10-CM

## 2021-10-06 DIAGNOSIS — E890 Postprocedural hypothyroidism: Secondary | ICD-10-CM

## 2021-10-23 DIAGNOSIS — I493 Ventricular premature depolarization: Secondary | ICD-10-CM | POA: Diagnosis not present

## 2021-10-23 DIAGNOSIS — R9431 Abnormal electrocardiogram [ECG] [EKG]: Secondary | ICD-10-CM | POA: Diagnosis not present

## 2021-11-09 ENCOUNTER — Other Ambulatory Visit: Payer: Self-pay

## 2021-11-09 ENCOUNTER — Encounter: Payer: Self-pay | Admitting: Nurse Practitioner

## 2021-11-09 ENCOUNTER — Ambulatory Visit (INDEPENDENT_AMBULATORY_CARE_PROVIDER_SITE_OTHER): Payer: Medicare Other | Admitting: Nurse Practitioner

## 2021-11-09 VITALS — BP 120/64 | HR 78 | Ht 66.0 in | Wt 174.0 lb

## 2021-11-09 DIAGNOSIS — I10 Essential (primary) hypertension: Secondary | ICD-10-CM

## 2021-11-09 DIAGNOSIS — T7840XA Allergy, unspecified, initial encounter: Secondary | ICD-10-CM

## 2021-11-09 DIAGNOSIS — E118 Type 2 diabetes mellitus with unspecified complications: Secondary | ICD-10-CM | POA: Diagnosis not present

## 2021-11-09 DIAGNOSIS — E785 Hyperlipidemia, unspecified: Secondary | ICD-10-CM

## 2021-11-09 DIAGNOSIS — E890 Postprocedural hypothyroidism: Secondary | ICD-10-CM

## 2021-11-09 DIAGNOSIS — K5900 Constipation, unspecified: Secondary | ICD-10-CM

## 2021-11-09 DIAGNOSIS — E1169 Type 2 diabetes mellitus with other specified complication: Secondary | ICD-10-CM

## 2021-11-09 DIAGNOSIS — Z1231 Encounter for screening mammogram for malignant neoplasm of breast: Secondary | ICD-10-CM

## 2021-11-09 DIAGNOSIS — F3341 Major depressive disorder, recurrent, in partial remission: Secondary | ICD-10-CM

## 2021-11-09 MED ORDER — LEVOTHYROXINE SODIUM 50 MCG PO TABS: 50.0000 ug | ORAL_TABLET | Freq: Every day | ORAL | 0 refills | Status: DC

## 2021-11-09 MED ORDER — AZELASTINE-FLUTICASONE 137-50 MCG/ACT NA SUSP: 1.0000 | Freq: Two times a day (BID) | NASAL | 0 refills | Status: DC

## 2021-11-09 MED ORDER — SALINE SPRAY 0.65 % NA SOLN: 1.0000 | NASAL | 0 refills | Status: DC | PRN

## 2021-11-09 NOTE — Patient Instructions (Addendum)
Please get your shingles vaccine at your pharmacy.  Please get your labs done 3 to 5 days before your next appointment.  Goal for fasting blood sugar ranges from 80 to 120 and 2 hours after any meal or at bedtime should be between 130 to 170.  For constipation it is important that you have an adequate intake of fruit and vegetables daily, at least 3 servings of each, as well as water intake of at least 48 ounces daily and regular exercise.  OTC stool softeners are helpful for daily use, up to 4 daily (eg. Colace)  Fiber intake daily is needed, in the form of Bran or Shredded Wheat    It is important that you exercise regularly at least 30 minutes 5 times a week.  Think about what you will eat, plan ahead. Choose " clean, green, fresh or frozen" over canned, processed or packaged foods which are more sugary, salty and fatty. 70 to 75% of food eaten should be vegetables and fruit. Three meals at set times with snacks allowed between meals, but they must be fruit or vegetables. Aim to eat over a 12 hour period , example 7 am to 7 pm, and STOP after  your last meal of the day. Drink water,generally about 64 ounces per day, no other drink is as healthy. Fruit juice is best enjoyed in a healthy way, by EATING the fruit.  Thanks for choosing [facility], we consider it a privelige to serve you.

## 2021-11-09 NOTE — Assessment & Plan Note (Signed)
Patient states that she has used Flonase but Flonase does not help her allergies. Complains of clear nasal drainage sinus pressure she has the symptoms are year-round. RX azelastine -flucatisone spray, one spray daily.  Saline nasal spray PRN

## 2021-11-09 NOTE — Assessment & Plan Note (Signed)
Well-controlled continue Prozac 40 mg daily.

## 2021-11-09 NOTE — Assessment & Plan Note (Signed)
DASH diet and commitment to daily physical activity for a minimum of 30 minutes discussed and encouraged, as a part of hypertension management. The importance of attaining a healthy weight is also discussed.  BP/Weight 11/09/2021 09/22/2021 09/14/2021 04/27/2021 08/20/2020 05/02/2020 05/28/2992  Systolic BP 716 967 893 810 175 102 585  Diastolic BP 64 78 80 78 70 56 72  Wt. (Lbs) 174 179.4 175.8 171 169 171 176  BMI 28.08 28.96 28.37 27.6 27.28 27.6 28.41   Continue current med.

## 2021-11-09 NOTE — Progress Notes (Signed)
New Patient Office Visit  Subjective:  Patient ID: Angela Rose, female    DOB: 10/23/1945  Age: 76 y.o. MRN: [id number]  CC:  Chief Complaint  Patient presents with   New Patient (Initial Visit)    New pt. Previous PCP Dr. Ignacia Marvel in [place]. Has been out of her thyroid medication x 4-5 days.     HPI Angela Rose presents to establish care. Previous PCP , Dr [name] in [place]. She wants a PCP close to where she lives now.   HTN. Amlodipine 2.5mg , Enapril 5mg  daily, HCTZ 25mg  Depression. Prozac 40mg  daily Hypothyroidism . Synthroid 56mcg DM. Metformin 1000 mg BID, Levemir 30units BID. She reports so many lows in the mornings, takes 24-28 units HLD Crestor 20mg . ALLERGIES Patient complains of sinus pressure clear nasal drainage, occasional sneezing, postnasal drip she has used Flonase but Flonase does not help she states she has symptoms all year round. Denies fever , chills , cough , body aches.   She is in a study at [facility]  for DM, she goes once every 6 months, she wasn't to get out of the stiudy , she doesn't even like the telephone calls. Previous PCP has been managing her diabetic medications.  Past Medical History:  Diagnosis Date   Anxiety    Cancer ([place])    melanoma face   Depression    Diabetes mellitus without complication ([place])    Fracture of tibial plateau 12/31/2015   Hyperlipidemia    Hypertension     Past Surgical History:  Procedure Laterality Date   ABDOMINAL HYSTERECTOMY     APPENDECTOMY     BASAL CELL CARCINOMA EXCISION     BREAST BIOPSY Right    needle bx-neg   CATARACT EXTRACTION, BILATERAL Bilateral 11/23/2018   CORONARY ARTERY BYPASS GRAFT  1996   x 3   MELANOMA EXCISION     PARTIAL HYSTERECTOMY  1976   ovaries remain    Family History  Problem Relation Age of Onset   Skin cancer Mother    Squamous cell carcinoma Mother    Basal cell carcinoma Mother    Heart disease Father    CAD Father    Breast cancer Neg Hx     Social  History   Socioeconomic History   Marital status: Significant Other    Spouse name: Not on file   Number of children: 3   Years of education: Not on file   Highest education level: Some college, no degree  Occupational History   Occupation: retired  Tobacco Use   Smoking status: Former    Types: Cigarettes    Quit date: 06/11/1982    Years since quitting: 39.4   Smokeless tobacco: Never   Tobacco comments:    smoking cessation materials not required  Vaping Use   Vaping Use: Never used  Substance and Sexual Activity   Alcohol use: Yes    Alcohol/week: 0.0 standard drinks    Comment: rare 1 wine glass of wine   Drug use: No   Sexual activity: Not on file  Other Topics Concern   Not on file  Social History Narrative   Not on file   Social Determinants of Health   Financial Resource Strain: Not on file  Food Insecurity: Not on file  Transportation Needs: Not on file  Physical Activity: Not on file  Stress: Not on file  Social Connections: Not on file  Intimate Partner Violence: Not on file    ROS Review of  Systems  Constitutional: Negative.   HENT:  Positive for postnasal drip, rhinorrhea, sinus pressure and sneezing. Negative for ear discharge, ear pain and trouble swallowing.   Respiratory: Negative.    Cardiovascular: Negative.   Gastrointestinal:  Positive for constipation. Negative for abdominal distention, abdominal pain, anal bleeding, blood in stool, nausea, rectal pain and vomiting.  Psychiatric/Behavioral: Negative.     Objective:   Today's Vitals: BP 120/64 (BP Location: Right Arm, Patient Position: Sitting, Cuff Size: Normal)    Pulse 78    Ht 5\' 6"  (1.676 m)    Wt 174 lb (78.9 kg)    SpO2 97%    BMI 28.08 kg/m   Physical Exam Constitutional:      General: She is not in acute distress.    Appearance: She is not ill-appearing, toxic-appearing or diaphoretic.  HENT:     Nose: No congestion or rhinorrhea.     Mouth/Throat:     Mouth: Mucous membranes  are moist.     Pharynx: Oropharynx is clear. No oropharyngeal exudate or posterior oropharyngeal erythema.  Cardiovascular:     Rate and Rhythm: Normal rate.     Heart sounds: No murmur heard.   No friction rub. No gallop.  Pulmonary:     Effort: No respiratory distress.     Breath sounds: No stridor. No wheezing, rhonchi or rales.  Chest:     Chest wall: No tenderness.  Abdominal:     Palpations: Abdomen is soft.  Neurological:     Mental Status: She is alert.  Psychiatric:        Mood and Affect: Mood normal.        Behavior: Behavior normal.        Thought Content: Thought content normal.        Judgment: Judgment normal.    Assessment & Plan:   Problem List Items Addressed This Visit   None   Outpatient Encounter Medications as of 11/09/2021  Medication Sig   amLODipine (NORVASC) 2.5 MG tablet TAKE 1 TABLET BY MOUTH  DAILY   aspirin 81 MG tablet Take by mouth every evening.    enalapril (VASOTEC) 5 MG tablet TAKE 1 TABLET BY MOUTH  TWICE DAILY   FLUoxetine (PROZAC) 40 MG capsule TAKE 1 CAPSULE BY MOUTH  DAILY   glucose blood test strip Frequency:PHARMDIR   Dosage:0.0     Instructions:  Note:testing 3-4xqd, DX Code 250.00 Dose: 1   hydrochlorothiazide (HYDRODIURIL) 25 MG tablet TAKE 1 TABLET BY MOUTH  DAILY   LEVEMIR FLEXTOUCH 100 UNIT/ML FlexTouch Pen INJECT 30 UNITS  SUBCUTANEOUSLY TWICE DAILY   metFORMIN (GLUCOPHAGE-XR) 500 MG 24 hr tablet Take 2 tablets (1,000 mg total) by mouth 2 (two) times daily.   rosuvastatin (CRESTOR) 20 MG tablet Take 1 tablet (20 mg total) by mouth daily.   BD PEN NEEDLE NANO U/F 32G X 4 MM MISC USE 1  TWICE DAILY   levothyroxine (SYNTHROID) 50 MCG tablet Take 1 tablet (50 mcg total) by mouth daily before breakfast. (Patient not taking: Reported on 11/09/2021)   [DISCONTINUED] betamethasone dipropionate 0.05 % lotion SMARTSIG:Sparingly Topical 1 to 2 Times Daily PRN   [DISCONTINUED] Cyanocobalamin 5000 MCG LOZG Take 1 tablet by mouth daily.    [DISCONTINUED] meloxicam (MOBIC) 15 MG tablet Take 1 tablet (15 mg total) by mouth daily.   No facility-administered encounter medications on file as of 11/09/2021.    Follow-up: No follow-ups on file.   Renee Rival, FNP

## 2021-11-09 NOTE — Assessment & Plan Note (Signed)
Takes Crestor 20 mg daily, Check lipid panel in 3 months Lab Results  Component Value Date   CHOL 175 09/30/2021   HDL 54 09/30/2021   LDLCALC 104 (H) 09/30/2021   TRIG 93 09/30/2021   CHOLHDL 3.2 09/30/2021

## 2021-11-09 NOTE — Assessment & Plan Note (Signed)
Lab Results  Component Value Date   TSH 1.990 09/30/2021  Continue Synthroid 50 MCG daily  LABS IN 3 MONTHS Meds refilled today

## 2021-11-09 NOTE — Assessment & Plan Note (Signed)
Takes metformin 1000 mg 2 times daily, Levemir 30 mg twice daily. Patient states that she has been taking between 24 to 28 units of Levemir due to having low blood sugars in the morning. She seldomly have CBG's in the 60's Goal for AM CBG discussed with patient, she verbalized understanding. fasting 80-120 Night time 130-170. Check A1c in 3 months.

## 2021-11-09 NOTE — Assessment & Plan Note (Signed)
Patient states that she has bowel movement every day but her Stool  is hard' Patient told to drink plenty of water, increase fiber,  and use stool softener as needed

## 2021-11-16 ENCOUNTER — Other Ambulatory Visit: Payer: Self-pay | Admitting: Internal Medicine

## 2021-11-16 DIAGNOSIS — E118 Type 2 diabetes mellitus with unspecified complications: Secondary | ICD-10-CM

## 2021-11-19 ENCOUNTER — Other Ambulatory Visit: Payer: Self-pay | Admitting: Internal Medicine

## 2021-11-19 DIAGNOSIS — I10 Essential (primary) hypertension: Secondary | ICD-10-CM

## 2021-11-19 NOTE — Telephone Encounter (Signed)
Requested medications are due for refill today.  Yes  Requested medications are on the active medications list.  yes  Last refill. 07/14/2021 -  90 day supply  Future visit scheduled.   no  Notes to clinic.  It appears that pt is now going to a new provider.    Requested Prescriptions  Pending Prescriptions Disp Refills   enalapril (VASOTEC) 5 MG tablet [Pharmacy Med Name: Enalapril Maleate 5 MG Oral Tablet] 180 tablet 3    Sig: TAKE 1 TABLET BY MOUTH  TWICE DAILY     Cardiovascular:  ACE Inhibitors Failed - 11/19/2021 11:29 PM      Failed - Cr in normal range and within 180 days    Creatinine, Ser  Date Value Ref Range Status  04/27/2021 1.20 (H) 0.57 - 1.00 mg/dL Final          Failed - K in normal range and within 180 days    Potassium  Date Value Ref Range Status  04/27/2021 4.4 3.5 - 5.2 mmol/L Final          Passed - Patient is not pregnant      Passed - Last BP in normal range    BP Readings from Last 1 Encounters:  11/09/21 120/64          Passed - Valid encounter within last 6 months    Recent Outpatient Visits           1 month ago Essential (primary) hypertension   [facility] Glean Hess, [facility]   6 months ago Type II diabetes mellitus with complication [facility])   [facility] Glean Hess, [facility]   1 year ago Essential (primary) hypertension   [facility] Glean Hess, MD   1 year ago Type II diabetes mellitus with complication [facility])   [facility] Glean Hess, MD   1 year ago Annual physical exam   [facility] Glean Hess, MD       Future Appointments             In 4 weeks Branch, Royetta Crochet, MD [facility] [place], [facility]   In 2 months [place], Dewaine Conger, FNP [facility], [facility]   In 4 months [name], [name], MD [facility], [facility]             amLODipine (NORVASC) 2.5 MG tablet [Pharmacy Med Name: amLODIPine Besylate 2.5 MG Oral Tablet] 90 tablet 3     Sig: TAKE 1 TABLET BY MOUTH  DAILY     Cardiovascular: Calcium Channel Blockers 2 Passed - 11/19/2021 11:29 PM      Passed - Last BP in normal range    BP Readings from Last 1 Encounters:  11/09/21 120/64          Passed - Last Heart Rate in normal range    Pulse Readings from Last 1 Encounters:  11/09/21 78          Passed - Valid encounter within last 6 months    Recent Outpatient Visits           1 month ago Essential (primary) hypertension   [facility] Glean Hess, MD   6 months ago Type II diabetes mellitus with complication [facility])   [facility] Glean Hess, MD   1 year ago Essential (primary) hypertension   [place], Laura H, MD   1 year ago Type II diabetes  mellitus with complication [facility])   [facility] Glean Hess, MD   1 year ago Annual physical exam   [facility] Glean Hess, MD       Future Appointments             In 4 weeks Branch, Royetta Crochet, MD [facility] [place], [facility]   In 2 months [place], Dewaine Conger, FNP [facility], [facility]   In 4 months [name], [name], MD [facility], [facility]             hydrochlorothiazide (HYDRODIURIL) 25 MG tablet [Pharmacy Med Name: hydroCHLOROthiazide 25 MG Oral Tablet] 90 tablet 3    Sig: TAKE 1 TABLET BY MOUTH  DAILY     Cardiovascular: Diuretics - Thiazide Failed - 11/19/2021 11:29 PM      Failed - Cr in normal range and within 180 days    Creatinine, Ser  Date Value Ref Range Status  04/27/2021 1.20 (H) 0.57 - 1.00 mg/dL Final          Failed - K in normal range and within 180 days    Potassium  Date Value Ref Range Status  04/27/2021 4.4 3.5 - 5.2 mmol/L Final          Failed - Na in normal range and within 180 days    Sodium  Date Value Ref Range Status  04/27/2021 135 134 - 144 mmol/L Final          Passed - Last BP in normal range    BP Readings from Last 1 Encounters:  11/09/21 120/64           Passed - Valid encounter within last 6 months    Recent Outpatient Visits           1 month ago Essential (primary) hypertension   [facility] Glean Hess, MD   6 months ago Type II diabetes mellitus with complication [facility])   [facility] Glean Hess, MD   1 year ago Essential (primary) hypertension   [facility] Glean Hess, MD   1 year ago Type II diabetes mellitus with complication [facility])   [facility] Glean Hess, MD   1 year ago Annual physical exam   [facility] Glean Hess, MD       Future Appointments             In 4 weeks Branch, Royetta Crochet, MD [facility] [employer], [facility]   In 2 months [place], Dewaine Conger, FNP [facility], [facility]   In 4 months [name], [name], MD [facility], [facility]             FLUoxetine (PROZAC) 40 MG capsule [Pharmacy Med Name: FLUOXETINE  40MG   CAP] 90 capsule 3    Sig: TAKE 1 CAPSULE BY MOUTH  DAILY     Psychiatry:  Antidepressants - SSRI Passed - 11/19/2021 11:29 PM      Passed - Completed PHQ-2 or PHQ-9 in the last 360 days      Passed - Valid encounter within last 6 months    Recent Outpatient Visits           1 month ago Essential (primary) hypertension   [facility] Glean Hess, MD   6 months ago Type II diabetes mellitus with complication [facility])   [facility] Glean Hess, MD   1 year ago Essential (primary) hypertension  [facility] Glean Hess, MD   1 year ago Type II diabetes mellitus with complication [facility])   [facility] Glean Hess, MD   1 year ago Annual physical exam   [facility] Glean Hess, MD       Future Appointments             In 4 weeks Branch, Royetta Crochet, MD [facility] [place], [facility]   In 2 months [place], Dewaine Conger, FNP [facility], [facility]   In 4 months [name], [name], MD [facility], [place]

## 2021-11-23 ENCOUNTER — Ambulatory Visit (HOSPITAL_COMMUNITY)
Admission: RE | Admit: 2021-11-23 | Discharge: 2021-11-23 | Disposition: A | Discharge status: Home / Self Care | Payer: Medicare Other | Source: Ambulatory Visit | Attending: Nurse Practitioner | Admitting: Nurse Practitioner

## 2021-11-23 ENCOUNTER — Other Ambulatory Visit: Payer: Self-pay | Admitting: Internal Medicine

## 2021-11-23 ENCOUNTER — Other Ambulatory Visit: Payer: Self-pay

## 2021-11-23 DIAGNOSIS — E113299 Type 2 diabetes mellitus with mild nonproliferative diabetic retinopathy without macular edema, unspecified eye: Secondary | ICD-10-CM

## 2021-11-23 DIAGNOSIS — Z1231 Encounter for screening mammogram for malignant neoplasm of breast: Secondary | ICD-10-CM | POA: Insufficient documentation

## 2021-11-23 DIAGNOSIS — E118 Type 2 diabetes mellitus with unspecified complications: Secondary | ICD-10-CM

## 2021-11-23 MED ORDER — METFORMIN HCL ER 500 MG PO TB24: 1000.0000 mg | ORAL_TABLET | Freq: Two times a day (BID) | ORAL | 0 refills | Status: DC

## 2021-11-23 MED ORDER — INSULIN DETEMIR 100 UNIT/ML FLEXPEN: 30.0000 [IU] | PEN_INJECTOR | Freq: Two times a day (BID) | SUBCUTANEOUS | 1 refills | Status: DC

## 2021-12-09 ENCOUNTER — Other Ambulatory Visit: Payer: Self-pay

## 2021-12-09 ENCOUNTER — Telehealth: Payer: Self-pay | Admitting: Nurse Practitioner

## 2021-12-09 MED ORDER — BD PEN NEEDLE NANO U/F 32G X 4 MM MISC: 6 refills | Status: DC

## 2021-12-09 NOTE — Telephone Encounter (Signed)
Pt needs the pen needles #4 called in ? ? Please send to [place]  ? ?Pt takes 2 shots a day --please count that in  ?

## 2021-12-09 NOTE — Telephone Encounter (Signed)
refilled 

## 2021-12-14 ENCOUNTER — Ambulatory Visit (INDEPENDENT_AMBULATORY_CARE_PROVIDER_SITE_OTHER): Payer: Medicare Other

## 2021-12-14 ENCOUNTER — Other Ambulatory Visit: Payer: Self-pay

## 2021-12-14 DIAGNOSIS — Z0000 Encounter for general adult medical examination without abnormal findings: Secondary | ICD-10-CM | POA: Diagnosis not present

## 2021-12-14 NOTE — Progress Notes (Signed)
Subjective:   Angela Rose is a 76 y.o. female who presents for Medicare Annual (Subsequent) preventive examination. I connected with  Angela Rose on 12/14/21 by a audio enabled telemedicine application and verified that I am speaking with the correct person using two identifiers.  Patient Location: Home  Provider Location: Home Office  I discussed the limitations of evaluation and management by telemedicine. The patient expressed understanding and agreed to proceed.   Review of Systems           Objective:    There were no vitals filed for this visit. There is no height or weight on file to calculate BMI.  Advanced Directives 10/15/2019 09/20/2018 06/20/2017  Does Patient Have a Medical Advance Directive? Yes No Yes  Type of Paramedic of [place];Living will - [place];Living will  Does patient want to make changes to medical advance directive? Yes (MAU/Ambulatory/Procedural Areas - Information given) - -  Copy of [place] in Chart? No - copy requested - No - copy requested  Would patient like information on creating a medical advance directive? - Yes (MAU/Ambulatory/Procedural Areas - Information given) -    Current Medications (verified) Outpatient Encounter Medications as of 12/14/2021  Medication Sig   amLODipine (NORVASC) 2.5 MG tablet TAKE 1 TABLET BY MOUTH  DAILY   aspirin 81 MG tablet Take by mouth every evening.    Azelastine-Fluticasone 137-50 MCG/ACT SUSP Place 1 spray into the nose every 12 (twelve) hours.   enalapril (VASOTEC) 5 MG tablet TAKE 1 TABLET BY MOUTH  TWICE DAILY   FLUoxetine (PROZAC) 40 MG capsule TAKE 1 CAPSULE BY MOUTH  DAILY   glucose blood test strip Frequency:PHARMDIR   Dosage:0.0     Instructions:  Note:testing 3-4xqd, DX Code 250.00 Dose: 1   hydrochlorothiazide (HYDRODIURIL) 25 MG tablet TAKE 1 TABLET BY MOUTH  DAILY   insulin detemir (LEVEMIR) 100 UNIT/ML FlexPen  Inject 30 Units into the skin 2 (two) times daily.   Insulin Pen Needle (BD PEN NEEDLE NANO U/F) 32G X 4 MM MISC USE 1  TWICE DAILY   levothyroxine (SYNTHROID) 50 MCG tablet Take 1 tablet (50 mcg total) by mouth daily before breakfast.   metFORMIN (GLUCOPHAGE-XR) 500 MG 24 hr tablet Take 2 tablets (1,000 mg total) by mouth 2 (two) times daily.   rosuvastatin (CRESTOR) 20 MG tablet Take 1 tablet (20 mg total) by mouth daily.   sodium chloride (OCEAN) 0.65 % SOLN nasal spray Place 1 spray into both nostrils as needed for congestion.   No facility-administered encounter medications on file as of 12/14/2021.    Allergies (verified) Propoxyphene, Lidocaine-epinephrine, Other, Tape, Xylocaine  [lidocaine hcl], and Jardiance [empagliflozin]   History: Past Medical History:  Diagnosis Date   Anxiety    Cancer ([place])    melanoma face   Depression    Diabetes mellitus without complication ([place])    Fracture of tibial plateau 12/31/2015   Hyperlipidemia    Hypertension    Past Surgical History:  Procedure Laterality Date   ABDOMINAL HYSTERECTOMY     APPENDECTOMY     BASAL CELL CARCINOMA EXCISION     BREAST BIOPSY Right    needle bx-neg   CATARACT EXTRACTION, BILATERAL Bilateral 11/23/2018   CORONARY ARTERY BYPASS GRAFT  1996   x 3   MELANOMA EXCISION     PARTIAL HYSTERECTOMY  1976   ovaries remain   Family History  Problem Relation Age of Onset  Skin cancer Mother    Squamous cell carcinoma Mother    Basal cell carcinoma Mother    Heart disease Father    CAD Father    Breast cancer Neg Hx    Social History   Socioeconomic History   Marital status: Significant Other    Spouse name: Not on file   Number of children: 3   Years of education: Not on file   Highest education level: Some college, no degree  Occupational History   Occupation: retired  Tobacco Use   Smoking status: Former    Types: Cigarettes    Quit date: 06/11/1982    Years since quitting: 39.5   Smokeless  tobacco: Never   Tobacco comments:    smoking cessation materials not required  Vaping Use   Vaping Use: Never used  Substance and Sexual Activity   Alcohol use: Yes    Alcohol/week: 0.0 standard drinks    Comment: rare 1 wine glass of wine   Drug use: No   Sexual activity: Not on file  Other Topics Concern   Not on file  Social History Narrative   Not on file   Social Determinants of Health   Financial Resource Strain: Not on file  Food Insecurity: Not on file  Transportation Needs: Not on file  Physical Activity: Not on file  Stress: Not on file  Social Connections: Not on file    Tobacco Counseling Counseling given: Not Answered Tobacco comments: smoking cessation materials not required   Clinical Intake:  Angela Rose , Thank you for taking time to come for your Medicare Wellness Visit. I appreciate your ongoing commitment to your health goals. Please review the following plan we discussed and let me know if I can assist you in the future.   These are the goals we discussed:  Goals      DIET - INCREASE WATER INTAKE     Recommend drinking 6-8 glasses of water per day      Increase physical activity     Recommend increasing physical activity to at least 3 times per week     PharmD " I want to be healthy"     [place] (see longitudinal plan of care for additional care plan information)  Current Barriers:  Chronic Disease Management support, education, and care coordination needs related to Hypertension, Hyperlipidemia, and Diabetes   Hypertension BP Readings from Last 3 Encounters:  08/20/20 128/70  05/02/20 (!) 130/56  12/25/19 136/72  Pharmacist Clinical Goal(s): Over the next 60 days, patient will work with PharmD and providers to maintain BP goal <130/80 Current regimen:  Enalapril '5mg'$  bid Amlodipine 2.'5mg'$   qd Hydrochlorothiazide 25 mg qd Interventions: Comprehensive medication review performed, medication list updated in electronic  medical record Inter-disciplinary care team collaboration (see longitudinal plan of care) Provided diet and exercise counseling. Patient self care activities - Over the next 60 days, patient will: Ensure daily salt intake < 2300 mg/day Increase exercise with goal of 30 min/day 5 days/week  Hyperlipidemia Lab Results  Component Value Date/Time   LDLCALC 110 07/24/2020 12:00 AM   LDLCALC 103 (H) 12/25/2019 11:08 AM  Pharmacist Clinical Goal(s): Over the next 60 days, patient will work with PharmD and providers to achieve LDL goal < 70 Current regimen:  Lovastatin 20 mg qd  ASA 81 mg qd Interventions: Comprehensive medication review performed, medication list updated in electronic medical record Inter-disciplinary care team collaboration (see longitudinal plan of care) Will discuss increasing atorvastatin dose to  40 mg with PCP Patient self care activities - Over the next 60 days, patient will: Take medications as prescribed Increase exercise with goal of 30 min/day 5 days/week Focus on DASH diet  Diabetes Lab Results  Component Value Date/Time   HGBA1C 7.7 08/12/2020 12:00 AM   HGBA1C 8.7 (H) 05/02/2020 10:17 AM   HGBA1C 8.9 05/01/2020 12:00 AM  Pharmacist Clinical Goal(s): Over the next 60 days, patient will work with PharmD and providers to achieve A1c goal <7% Current regimen:  Insulin detemir (Levemir) 25-30 u qam, 20 units qpm Metformin er 500 mg 2 tabs bid SOUL study drug '3mg'$ , 7 tabs qd (semaglutide oral vs. placebo Interventions: Comprehensive medication review performed, medication list updated in electronic medical record Inter-disciplinary care team collaboration (see longitudinal plan of care) Reviewed goal glucose readings for an A1c of <7%, we want to see fasting sugars <130 and 2 hour after meal sugars <180.  Reviewed 15/15 rule for treating hypoglycemia Patient self care activities - Over the next 60 days, patient will: Check blood sugar twice daily, document,  and provide at future appointments Contact provider with any episodes of hypoglycemia Provide required portion of patient assistance documentation if needed.  Medication management Pharmacist Clinical Goal(s): Over the next 60 days, patient will work with PharmD and providers to achieve optimal medication adherence Current pharmacy: [facility]  Interventions Comprehensive medication review performed. Continue current medication management strategy Patient self care activities - Over the next 60 days, patient will: Take medications as prescribed Report any questions or concerns to PharmD and/or provider(s)  Initial goal documentation         This is a list of the screening recommended for you and due dates:  Health Maintenance  Topic Date Due   Zoster (Shingles) Vaccine (1 of 2) Never done   Hemoglobin A1C  03/23/2022   Complete foot exam   04/27/2022   Eye exam for diabetics  10/19/2022   Mammogram  11/23/2022   Tetanus Vaccine  06/12/2023   Pneumonia Vaccine  Completed   Flu Shot  Completed   DEXA scan (bone density measurement)  Completed   COVID-19 Vaccine  Completed   Hepatitis C Screening: USPSTF Recommendation to screen - Ages 55-79 yo.  Completed   HPV Vaccine  Aged Out   Colon Cancer Screening  Discontinued                    Diabetic?yes Nutrition Risk Assessment:  Has the patient had any N/V/D within the last 2 months?  No  Does the patient have any non-healing wounds?  No  Has the patient had any unintentional weight loss or weight gain?  No   Diabetes:  Is the patient diabetic?  Yes  If diabetic, was a CBG obtained today?  No  Did the patient bring in their glucometer from home?  No  How often do you monitor your CBG's? daily.   Financial Strains and Diabetes Management:  Are you having any financial strains with the device, your supplies or your medication? No .  Does the patient want to be seen by Chronic Care  Management for management of their diabetes?  No  Would the patient like to be referred to a Nutritionist or for Diabetic Management?  No   Diabetic Exams:  Diabetic Eye Exam: Completed 10/19/21 Diabetic Foot Exam: Completed 04/27/21           Activities of Daily Living In your present state of health, do  you have any difficulty performing the following activities: 09/22/2021 04/27/2021  Hearing? Tempie Donning  Vision? N N  Difficulty concentrating or making decisions? N Y  Walking or climbing stairs? N N  Dressing or bathing? N N  Doing errands, shopping? N N  Some recent data might be hidden    Patient Care Team: Renee Rival, FNP as PCP - General (Nurse Practitioner) Janina Mayo, MD as PCP - Cardiology (Cardiology) Hollice Espy, MD as Consulting Physician (Urology) Vladimir Faster, [facility] (Inactive) (Pharmacist)  Indicate any recent Medical Services you may have received from other than [facility] providers in the past year (date may be approximate).     Assessment:   This is a routine wellness examination for Angela Rose.  Hearing/Vision screen No results found.  Dietary issues and exercise activities discussed:     Goals Addressed   None   Depression Screen PHQ 2/9 Scores 11/09/2021 09/22/2021 04/27/2021 08/20/2020 05/02/2020 12/25/2019 10/15/2019  PHQ - 2 Score 0 0 '6 2 2 2 6  '$ PHQ- 9 Score - '2 13 2 3 3 8    '$ Fall Risk Fall Risk  11/09/2021 09/22/2021 04/27/2021 08/20/2020 05/02/2020  Falls in the past year? 0 0 0 0 1  Number falls in past yr: 0 0 0 0 0  Injury with Fall? 0 0 0 0 1  Risk for fall due to : No Fall Risks No Fall Risks No Fall Risks No Fall Risks No Fall Risks  Follow up Falls evaluation completed Falls evaluation completed Falls evaluation completed Falls evaluation completed Falls evaluation completed    [place]:  Any stairs in or around the home? Yes  If so, are there any without handrails? Yes  Home free of loose throw  rugs in walkways, pet beds, electrical cords, etc? Yes  Adequate lighting in your home to reduce risk of falls? Yes   ASSISTIVE DEVICES UTILIZED TO PREVENT FALLS:  Life alert? No  Use of a cane, walker or w/c? No  Grab bars in the bathroom? Yes  Shower chair or bench in shower? No  Elevated toilet seat or a handicapped toilet? Yes       6CIT Screen 10/15/2019 09/20/2018 06/20/2017  What Year? 0 points 0 points 0 points  What month? 0 points 0 points 0 points  What time? 0 points 0 points 0 points  Count back from 20 0 points 0 points 0 points  Months in reverse 0 points 0 points 0 points  Repeat phrase 0 points 2 points 2 points  Total Score 0 2 2    Immunizations Immunization History  Administered Date(s) Administered   Fluad Quad(high Dose 65+) 07/25/2019, 08/20/2020, 09/30/2021   Influenza, High Dose Seasonal PF 06/20/2017, 06/23/2018   Influenza,inj,Quad PF,6+ Mos 08/14/2015, 09/27/2016   PFIZER Comirnaty(Gray Top)Covid-19 Tri-Sucrose Vaccine 12/01/2019, 12/23/2019   PFIZER(Purple Top)SARS-COV-2 Vaccination 12/01/2019, 12/23/2019, 07/20/2020, 04/24/2021   Pneumococcal Conjugate-13 02/16/2016   Pneumococcal Polysaccharide-23 11/15/2008, 10/14/2014    TDAP status: Up to date  Flu Vaccine status: Up to date  Pneumococcal vaccine status: Up to date  Covid-19 vaccine status: Completed vaccines  Qualifies for Shingles Vaccine? Yes   Zostavax completed No   Shingrix Completed?: No.    Education has been provided regarding the importance of this vaccine. Patient has been advised to call insurance company to determine out of pocket expense if they have not yet received this vaccine. Advised may also receive vaccine at local pharmacy or Health Dept.  Verbalized acceptance and understanding.  Screening Tests Health Maintenance  Topic Date Due   Zoster Vaccines- Shingrix (1 of 2) Never done   HEMOGLOBIN A1C  03/23/2022   FOOT EXAM  04/27/2022   OPHTHALMOLOGY EXAM  10/19/2022    MAMMOGRAM  11/23/2022   TETANUS/TDAP  06/12/2023   Pneumonia Vaccine 97+ Years old  Completed   INFLUENZA VACCINE  Completed   DEXA SCAN  Completed   COVID-19 Vaccine  Completed   Hepatitis C Screening  Completed   HPV VACCINES  Aged Out   COLONOSCOPY (Pts 45-25yr Insurance coverage will need to be confirmed)  Discontinued    Health Maintenance  Health Maintenance Due  Topic Date Due   Zoster Vaccines- Shingrix (1 of 2) Never done    Colorectal cancer screening: No longer required.   Mammogram status: Completed 11/23/21. Repeat every year  Bone Density status: Completed 11/13/2018. Results reflect: Bone density results: NORMAL. Repeat every 2 years.  Lung Cancer Screening: (Low Dose CT Chest recommended if Age 78-80years, 30 pack-year currently smoking OR have quit w/in 15years.) does not qualify.   Lung Cancer Screening Referral: No  Additional Screening:  Hepatitis C Screening: does qualify; Completed 08/11/18  Vision Screening: Recommended annual ophthalmology exams for early detection of glaucoma and other disorders of the eye. Is the patient up to date with their annual eye exam?  Yes  Who is the provider or what is the name of the office in which the patient attends annual eye exams? [facility]  If pt is not established with a provider, would they like to be referred to a provider to establish care? No .   Dental Screening: Recommended annual dental exams for proper oral hygiene  Community Resource Referral / Chronic Care Management: CRR required this visit?  No   CCM required this visit?  No      Plan:     I have personally reviewed and noted the following in the patients chart:   Medical and social history Use of alcohol, tobacco or illicit drugs  Current medications and supplements including opioid prescriptions.  Functional ability and status Nutritional status Physical activity Advanced directives List of other physicians Hospitalizations,  surgeries, and ER visits in previous 12 months Vitals Screenings to include cognitive, depression, and falls Referrals and appointments  In addition, I have reviewed and discussed with patient certain preventive protocols, quality metrics, and best practice recommendations. A written personalized care plan for preventive services as well as general preventive health recommendations were provided to patient.     Angela Rose  12/14/2021

## 2021-12-14 NOTE — Patient Instructions (Signed)
Angela Rose , Thank you for taking time to come for your Medicare Wellness Visit. I appreciate your ongoing commitment to your health goals. Please review the following plan we discussed and let me know if I can assist you in the future.   These are the goals we discussed:  Goals      DIET - INCREASE WATER INTAKE     Recommend drinking 6-8 glasses of water per day      Increase physical activity     Recommend increasing physical activity to at least 3 times per week     Patient Stated     Be able to grown a squash in the garden.     PharmD " I want to be healthy"     CARE PLAN ENTRY (see longitudinal plan of care for additional care plan information)  Current Barriers:  Chronic Disease Management support, education, and care coordination needs related to Hypertension, Hyperlipidemia, and Diabetes   Hypertension BP Readings from Last 3 Encounters:  08/20/20 128/70  05/02/20 (!) 130/56  12/25/19 136/72  Pharmacist Clinical Goal(s): Over the next 60 days, patient will work with PharmD and providers to maintain BP goal <130/80 Current regimen:  Enalapril '5mg'$  bid Amlodipine 2.'5mg'$   qd Hydrochlorothiazide 25 mg qd Interventions: Comprehensive medication review performed, medication list updated in electronic medical record Inter-disciplinary care team collaboration (see longitudinal plan of care) Provided diet and exercise counseling. Patient self care activities - Over the next 60 days, patient will: Ensure daily salt intake < 2300 mg/day Increase exercise with goal of 30 min/day 5 days/week  Hyperlipidemia Lab Results  Component Value Date/Time   LDLCALC 110 07/24/2020 12:00 AM   LDLCALC 103 (H) 12/25/2019 11:08 AM  Pharmacist Clinical Goal(s): Over the next 60 days, patient will work with PharmD and providers to achieve LDL goal < 70 Current regimen:  Lovastatin 20 mg qd  ASA 81 mg qd Interventions: Comprehensive medication review performed, medication list  updated in electronic medical record Inter-disciplinary care team collaboration (see longitudinal plan of care) Will discuss increasing atorvastatin dose to 40 mg with PCP Patient self care activities - Over the next 60 days, patient will: Take medications as prescribed Increase exercise with goal of 30 min/day 5 days/week Focus on DASH diet  Diabetes Lab Results  Component Value Date/Time   HGBA1C 7.7 08/12/2020 12:00 AM   HGBA1C 8.7 (H) 05/02/2020 10:17 AM   HGBA1C 8.9 05/01/2020 12:00 AM  Pharmacist Clinical Goal(s): Over the next 60 days, patient will work with PharmD and providers to achieve A1c goal <7% Current regimen:  Insulin detemir (Levemir) 25-30 u qam, 20 units qpm Metformin er 500 mg 2 tabs bid SOUL study drug '3mg'$ , 7 tabs qd (semaglutide oral vs. placebo Interventions: Comprehensive medication review performed, medication list updated in electronic medical record Inter-disciplinary care team collaboration (see longitudinal plan of care) Reviewed goal glucose readings for an A1c of <7%, we want to see fasting sugars <130 and 2 hour after meal sugars <180.  Reviewed 15/15 rule for treating hypoglycemia Patient self care activities - Over the next 60 days, patient will: Check blood sugar twice daily, document, and provide at future appointments Contact provider with any episodes of hypoglycemia Provide required portion of patient assistance documentation if needed.  Medication management Pharmacist Clinical Goal(s): Over the next 60 days, patient will work with PharmD and providers to achieve optimal medication adherence Current pharmacy: [facility]  Interventions Comprehensive medication review performed. Continue current medication management  strategy Patient self care activities - Over the next 60 days, patient will: Take medications as prescribed Report any questions or concerns to PharmD and/or provider(s)  Initial goal documentation          This is a list of the screening recommended for you and due dates:  Health Maintenance  Topic Date Due   Zoster (Shingles) Vaccine (1 of 2) Never done   Hemoglobin A1C  03/23/2022   Complete foot exam   04/27/2022   Eye exam for diabetics  10/19/2022   Mammogram  11/23/2022   Tetanus Vaccine  06/12/2023   Pneumonia Vaccine  Completed   Flu Shot  Completed   DEXA scan (bone density measurement)  Completed   COVID-19 Vaccine  Completed   Hepatitis C Screening: USPSTF Recommendation to screen - Ages 31-79 yo.  Completed   HPV Vaccine  Aged Out   Colon Cancer Screening  Discontinued  Angela Rose , Thank you for taking time to come for your Medicare Wellness Visit. I appreciate your ongoing commitment to your health goals. Please review the following plan we discussed and let me know if I can assist you in the future.   Screening recommendations/referrals: Colonoscopy: Complete Mammogram: Complete Bone Density: Complete Recommended yearly ophthalmology/optometry visit for glaucoma screening and checkup Recommended yearly dental visit for hygiene and checkup  Vaccinations: Influenza vaccine: Complete Pneumococcal vaccine: Complete Tdap vaccine: Complete Shingles vaccine: Due now     Advanced directives: Completed  Conditions/risks identified: Hypertension, Diabetes  Next appointment: 1 year    Preventive Care 2 Years and Older, Female Preventive care refers to lifestyle choices and visits with your health care provider that can promote health and wellness. What does preventive care include? A yearly physical exam. This is also called an annual well check. Dental exams once or twice a year. Routine eye exams. Ask your health care provider how often you should have your eyes checked. Personal lifestyle choices, including: Daily care of your teeth and gums. Regular physical activity. Eating a healthy diet. Avoiding tobacco and drug use. Limiting alcohol  use. Practicing safe sex. Taking low-dose aspirin every day. Taking vitamin and mineral supplements as recommended by your health care provider. What happens during an annual well check? The services and screenings done by your health care provider during your annual well check will depend on your age, overall health, lifestyle risk factors, and family history of disease. Counseling  Your health care provider may ask you questions about your: Alcohol use. Tobacco use. Drug use. Emotional well-being. Home and relationship well-being. Sexual activity. Eating habits. History of falls. Memory and ability to understand (cognition). Work and work Statistician. Reproductive health. Screening  You may have the following tests or measurements: Height, weight, and BMI. Blood pressure. Lipid and cholesterol levels. These may be checked every 5 years, or more frequently if you are over 14 years old. Skin check. Lung cancer screening. You may have this screening every year starting at age 57 if you have a 30-pack-year history of smoking and currently smoke or have quit within the past 15 years. Fecal occult blood test (FOBT) of the stool. You may have this test every year starting at age 38. Flexible sigmoidoscopy or colonoscopy. You may have a sigmoidoscopy every 5 years or a colonoscopy every 10 years starting at age 69. Hepatitis C blood test. Hepatitis B blood test. Sexually transmitted disease (STD) testing. Diabetes screening. This is done by checking your blood sugar (glucose) after you have not eaten  for a while (fasting). You may have this done every 1-3 years. Bone density scan. This is done to screen for osteoporosis. You may have this done starting at age 4. Mammogram. This may be done every 1-2 years. Talk to your health care provider about how often you should have regular mammograms. Talk with your health care provider about your test results, treatment options, and if necessary,  the need for more tests. Vaccines  Your health care provider may recommend certain vaccines, such as: Influenza vaccine. This is recommended every year. Tetanus, diphtheria, and acellular pertussis (Tdap, Td) vaccine. You may need a Td booster every 10 years. Zoster vaccine. You may need this after age 59. Pneumococcal 13-valent conjugate (PCV13) vaccine. One dose is recommended after age 17. Pneumococcal polysaccharide (PPSV23) vaccine. One dose is recommended after age 79. Talk to your health care provider about which screenings and vaccines you need and how often you need them. This information is not intended to replace advice given to you by your health care provider. Make sure you discuss any questions you have with your health care provider. Document Released: 10/24/2015 Document Revised: 06/16/2016 Document Reviewed: 07/29/2015 Elsevier Interactive Patient Education  2017 [place] Prevention in the Home Falls can cause injuries. They can happen to people of all ages. There are many things you can do to make your home safe and to help prevent falls. What can I do on the outside of my home? Regularly fix the edges of walkways and driveways and fix any cracks. Remove anything that might make you trip as you walk through a door, such as a raised step or threshold. Trim any bushes or trees on the path to your home. Use bright outdoor lighting. Clear any walking paths of anything that might make someone trip, such as rocks or tools. Regularly check to see if handrails are loose or broken. Make sure that both sides of any steps have handrails. Any raised decks and porches should have guardrails on the edges. Have any leaves, snow, or ice cleared regularly. Use sand or salt on walking paths during winter. Clean up any spills in your garage right away. This includes oil or grease spills. What can I do in the bathroom? Use night lights. Install grab bars by the toilet and in the  tub and shower. Do not use towel bars as grab bars. Use non-skid mats or decals in the tub or shower. If you need to sit down in the shower, use a plastic, non-slip stool. Keep the floor dry. Clean up any water that spills on the floor as soon as it happens. Remove soap buildup in the tub or shower regularly. Attach bath mats securely with double-sided non-slip rug tape. Do not have throw rugs and other things on the floor that can make you trip. What can I do in the bedroom? Use night lights. Make sure that you have a light by your bed that is easy to reach. Do not use any sheets or blankets that are too big for your bed. They should not hang down onto the floor. Have a firm chair that has side arms. You can use this for support while you get dressed. Do not have throw rugs and other things on the floor that can make you trip. What can I do in the kitchen? Clean up any spills right away. Avoid walking on wet floors. Keep items that you use a lot in easy-to-reach places. If you need to reach something  above you, use a strong step stool that has a grab bar. Keep electrical cords out of the way. Do not use floor polish or wax that makes floors slippery. If you must use wax, use non-skid floor wax. Do not have throw rugs and other things on the floor that can make you trip. What can I do with my stairs? Do not leave any items on the stairs. Make sure that there are handrails on both sides of the stairs and use them. Fix handrails that are broken or loose. Make sure that handrails are as long as the stairways. Check any carpeting to make sure that it is firmly attached to the stairs. Fix any carpet that is loose or worn. Avoid having throw rugs at the top or bottom of the stairs. If you do have throw rugs, attach them to the floor with carpet tape. Make sure that you have a light switch at the top of the stairs and the bottom of the stairs. If you do not have them, ask someone to add them for  you. What else can I do to help prevent falls? Wear shoes that: Do not have high heels. Have rubber bottoms. Are comfortable and fit you well. Are closed at the toe. Do not wear sandals. If you use a stepladder: Make sure that it is fully opened. Do not climb a closed stepladder. Make sure that both sides of the stepladder are locked into place. Ask someone to hold it for you, if possible. Clearly mark and make sure that you can see: Any grab bars or handrails. First and last steps. Where the edge of each step is. Use tools that help you move around (mobility aids) if they are needed. These include: Canes. Walkers. Scooters. Crutches. Turn on the lights when you go into a dark area. Replace any light bulbs as soon as they burn out. Set up your furniture so you have a clear path. Avoid moving your furniture around. If any of your floors are uneven, fix them. If there are any pets around you, be aware of where they are. Review your medicines with your doctor. Some medicines can make you feel dizzy. This can increase your chance of falling. Ask your doctor what other things that you can do to help prevent falls. This information is not intended to replace advice given to you by your health care provider. Make sure you discuss any questions you have with your health care provider. Document Released: 07/24/2009 Document Revised: 03/04/2016 Document Reviewed: 11/01/2014 Elsevier Interactive Patient Education  2017 [employer].

## 2021-12-16 ENCOUNTER — Telehealth: Payer: Self-pay

## 2021-12-16 NOTE — Telephone Encounter (Signed)
Patient called need med refill  ? ?insulin detemir (LEVEMIR) 100 UNIT/ML FlexPen  ? ? ?Pharmacy:  [facility] ?

## 2021-12-17 ENCOUNTER — Other Ambulatory Visit: Payer: Self-pay

## 2021-12-17 DIAGNOSIS — E113299 Type 2 diabetes mellitus with mild nonproliferative diabetic retinopathy without macular edema, unspecified eye: Secondary | ICD-10-CM

## 2021-12-17 MED ORDER — INSULIN DETEMIR 100 UNIT/ML FLEXPEN: 30.0000 [IU] | PEN_INJECTOR | Freq: Two times a day (BID) | SUBCUTANEOUS | 1 refills | Status: DC

## 2021-12-17 NOTE — Telephone Encounter (Signed)
Rx sent 

## 2021-12-18 ENCOUNTER — Ambulatory Visit: Payer: Medicare Other | Admitting: Internal Medicine

## 2021-12-28 ENCOUNTER — Ambulatory Visit (INDEPENDENT_AMBULATORY_CARE_PROVIDER_SITE_OTHER): Payer: Medicare Other | Admitting: Internal Medicine

## 2021-12-28 ENCOUNTER — Other Ambulatory Visit: Payer: Self-pay

## 2021-12-28 ENCOUNTER — Encounter: Payer: Self-pay | Admitting: Internal Medicine

## 2021-12-28 VITALS — BP 136/68 | HR 85 | Ht 66.0 in | Wt 174.8 lb

## 2021-12-28 DIAGNOSIS — E785 Hyperlipidemia, unspecified: Secondary | ICD-10-CM

## 2021-12-28 DIAGNOSIS — E1169 Type 2 diabetes mellitus with other specified complication: Secondary | ICD-10-CM | POA: Diagnosis not present

## 2021-12-28 NOTE — Progress Notes (Signed)
?Cardiology Office Note:   ? ?Date:  12/28/2021  ? ?ID:  Angela Rose, DOB 1946/09/02, MRN [id number] ? ?PCP:  Renee Rival, FNP ?  ?[facility] Providers ?Cardiologist:  Janina Mayo, MD    ? ?Referring MD: Glean Hess, MD  ? ?No chief complaint on file. ? ?Establish Care ? ?History of Present Illness:   ? ?Angela Rose is a 76 y.o. female with a hx of MI in 1993 CABG 1993 3v 1996, HTN, HLD, PAD, former smoker, carotid atherosclerosis, no hx of stroke, previously followed at [facility] w/ Dr. Nehemiah Massed when she lived in [place] she has since moved to [place]  referral to establish care here ? ?In March 12/25/2018 she had EKG per report during an annual visit and it showed ?atrial fibrillation with a HR of 74. She has CHADS2VASC of 5. She was reluctant to start anticoagulation. She was started on ASA. She saw cardiology at [facility] 01/16/2019 who did an event monitor and it showed PACs, PVCs, asymptomatic bradycardia, sinus tachycardia but no atrial fibrillation. No captured data c/w atrial fibrillation in the cardiology notes.  ? ?She was noted to have hx of 3v CABG in 1996, she notes mild MI 1993 with stents.. She has hx of PVD and carotid atherosclerosis that is stable. She was started on ASA 81 mg daily and statin.She had a stress test that was normal 01/10/2019. She denies palpitations. No chest pain. No pnd , orthopnea, or LE edema. She notes she is fatigued. She's been under a lot of stress..  ? ?For her hypertension she is managed on norvasc 2.5 mg daily, HCTz 25 mg daily and enalapril 5 mg BID. Her blood pressure is well controlled per report, it's high today. Its typically 115-125 mg.   For HLD she's on crestor 5 mg daily he has T2DM  on metformin 500 mg BID Shes on jardiance.  ? ?She's here with husband from [place], now they are in [place]. ? ? ?Labs: ?04/27/2021 ?LDL 108 ?TC 192 ?TSH 6.14  ?A1c 8.9% ? ? ? ? ? ?Interim Hx: ?Angela Rose presents for follow up. She notes some allergy  symptoms. No syncope. SOB, or chest pressure. She feels overall fatigued and worried it may be due to thyroid v aging ? ? ? ? ?Cardiology Studies ? ?TTE 07/23/2020 at [facility] ? ?NORMAL LEFT VENTRICULAR SYSTOLIC FUNCTION WITH MILD LVH ?NORMAL RIGHT VENTRICULAR SYSTOLIC FUNCTION ?MILD VALVULAR REGURGITATION (See above) ?NO VALVULAR STENOSIS ?MILD MR, TR, PR ?EF 50% ? ?Past Medical History:  ?Diagnosis Date  ? Anxiety   ? Cancer [facility])   ? melanoma face  ? Depression   ? Diabetes mellitus without complication ([place])   ? Fracture of tibial plateau 12/31/2015  ? Hyperlipidemia   ? Hypertension   ? ? ?Past Surgical History:  ?Procedure Laterality Date  ? ABDOMINAL HYSTERECTOMY    ? APPENDECTOMY    ? BASAL CELL CARCINOMA EXCISION    ? BREAST BIOPSY Right   ? needle bx-neg  ? CATARACT EXTRACTION, BILATERAL Bilateral 11/23/2018  ? CORONARY ARTERY BYPASS GRAFT  1996  ? x 3  ? MELANOMA EXCISION    ? PARTIAL HYSTERECTOMY  1976  ? ovaries remain  ? ? ?Current Medications: ?No outpatient medications have been marked as taking for the 12/28/21 encounter (Appointment) with Janina Mayo, MD.  ?  ? ?Allergies:   Propoxyphene, Lidocaine-epinephrine, Other, Tape, Xylocaine  [lidocaine hcl], and Jardiance [empagliflozin]  ? ?Social History  ? ?Socioeconomic History  ?  Marital status: Significant Other  ?  Spouse name: Not on file  ? Number of children: 3  ? Years of education: Not on file  ? Highest education level: Some college, no degree  ?Occupational History  ? Occupation: retired  ?Tobacco Use  ? Smoking status: Former  ?  Types: Cigarettes  ?  Quit date: 06/11/1982  ?  Years since quitting: 39.5  ? Smokeless tobacco: Never  ? Tobacco comments:  ?  smoking cessation materials not required  ?Vaping Use  ? Vaping Use: Never used  ?Substance and Sexual Activity  ? Alcohol use: Yes  ?  Alcohol/week: 0.0 standard drinks  ?  Comment: rare 1 wine glass of wine  ? Drug use: No  ? Sexual activity: Not on file  ?Other Topics Concern  ? Not on file   ?Social History Narrative  ? Not on file  ? ?Social Determinants of Health  ? ?Financial Resource Strain: Low Risk   ? Difficulty of Paying Living Expenses: Not hard at all  ?Food Insecurity: No Food Insecurity  ? Worried About Charity fundraiser in the Last Year: Never true  ? Ran Out of Food in the Last Year: Never true  ?Transportation Needs: No Transportation Needs  ? Lack of Transportation (Medical): No  ? Lack of Transportation (Non-Medical): No  ?Physical Activity: Inactive  ? Days of Exercise per Week: 0 days  ? Minutes of Exercise per Session: 0 min  ?Stress: Stress Concern Present  ? Feeling of Stress : To some extent  ?Social Connections: Moderately Isolated  ? Frequency of Communication with Friends and Family: More than three times a week  ? Frequency of Social Gatherings with Friends and Family: Once a week  ? Attends Religious Services: Never  ? Active Member of Clubs or Organizations: No  ? Attends Archivist Meetings: Never  ? Marital Status: Living with partner  ?  ? ?Family History: ?The patient's family history includes Basal cell carcinoma in her mother; CAD in her father; Heart disease in her father; Skin cancer in her mother; Squamous cell carcinoma in her mother. There is no history of Breast cancer. ? ?ROS:   ?Please see the history of present illness.    ? All other systems reviewed and are negative. ? ?EKGs/Labs/Other Studies Reviewed:   ? ?The following studies were reviewed today: ? ? ?EKG:  EKG is  ordered today.  The ekg ordered today demonstrates  ? ?Sinus with PACs,1st degree AV block, PVC ? ?Recent Labs: ?04/27/2021: ALT 18; BUN 20; Creatinine, Ser 1.20; Hemoglobin 13.1; Platelets 263; Potassium 4.4; Sodium 135 ?09/30/2021: TSH 1.990  ?Recent Lipid Panel ?   ?Component Value Date/Time  ? CHOL 175 09/30/2021 1420  ? TRIG 93 09/30/2021 1420  ? HDL 54 09/30/2021 1420  ? CHOLHDL 3.2 09/30/2021 1420  ? LDLCALC 104 (H) 09/30/2021 1420  ? ? ? ?Risk Assessment/Calculations:   ? ?     ? ?Physical Exam:   ? ?VS:   ?Vitals:  ? 12/28/21 1141  ?BP: 136/68  ?Pulse: 85  ?SpO2: 97%  ? ? ? ?Wt Readings from Last 3 Encounters:  ?11/09/21 174 lb (78.9 kg)  ?09/22/21 179 lb 6.4 oz (81.4 kg)  ?09/14/21 175 lb 12.8 oz (79.7 kg)  ?  ? ?GEN:  Well nourished, well developed in no acute distress ?HEENT: Normal ?NECK: No JVD; No carotid bruits ?LYMPHATICS: No lymphadenopathy ?CARDIAC: RRR, no murmurs, rubs, gallops ?RESPIRATORY:  Clear to auscultation without  rales, wheezing or rhonchi  ?ABDOMEN: Soft, non-tender, non-distended ?MUSCULOSKELETAL:  No edema; No deformity  ?SKIN: Warm and dry ?NEUROLOGIC:  Alert and oriented x 3 ?PSYCHIATRIC:  Normal affect  ? ?ASSESSMENT:   ? ?#Ischemic Heart Dx: Hx of CABG per above. Low Normal EF. No hx of CHF. Stable. Continue SGLT2, asa 81 mg daily, and crestor 5 mg daily. Had Wenckebach on her zio, can hold off on BB ?- LDL goal < 70, not at goal , increased her crestor, recheck today ?- continue crestor 20 mg daily ?-CABG >3 years, ok to continue off bb ? ?#HTN: continue Hctz 25 mg daily and norvasc 2.5 mg daily. Continue enalapril 5 mg daily.  ? ?#Elevated TSH: Noted in December. FU free T4 was normal. Will defer  continued checks of TSH  to adjust synthroid if needed to her PCP ? ?PLAN:   ? ?In order of problems listed above: ? ?Follow up 6 months ?Fasting Lipid ? ?   ? ?Medication Adjustments/Labs and Tests Ordered: ?Current medicines are reviewed at length with the patient today.  Concerns regarding medicines are outlined above.  ? ?Signed, ?Janina Mayo, MD  ?12/28/2021 11:05 AM    ?[place] ? ?

## 2021-12-28 NOTE — Patient Instructions (Signed)
Medication Instructions:  ?No Changes In Medications at this time.  ?*If you need a refill on your cardiac medications before your next appointment, please call your pharmacy* ? ?Lab Work: ?BLOOD WORK TODAY  ?If you have labs (blood work) drawn today and your tests are completely normal, you will receive your results only by: ?MyChart Message (if you have MyChart) OR ?A paper copy in the mail ?If you have any lab test that is abnormal or we need to change your treatment, we will call you to review the results. ? ?Follow-Up: ?At [facility], you and your health needs are our priority.  As part of our continuing mission to provide you with exceptional heart care, we have created designated Provider Care Teams.  These Care Teams include your primary Cardiologist (physician) and Advanced Practice Providers (APPs -  Physician Assistants and Nurse Practitioners) who all work together to provide you with the care you need, when you need it. ? ?Your next appointment:   ?6 month(s) ? ?The format for your next appointment:   ?In Person ? ?Provider:   ?Janina Mayo, MD   ?

## 2021-12-29 ENCOUNTER — Other Ambulatory Visit: Payer: Self-pay

## 2021-12-29 DIAGNOSIS — E1169 Type 2 diabetes mellitus with other specified complication: Secondary | ICD-10-CM

## 2021-12-29 LAB — LIPID PANEL
Chol/HDL Ratio: 3.4 ratio (ref 0.0–4.4)
Cholesterol, Total: 171 mg/dL (ref 100–199)
HDL: 51 mg/dL (ref >39)
LDL Chol Calc (NIH): 93 mg/dL (ref 0–99)
Triglycerides: 158 mg/dL — ABNORMAL HIGH (ref 0–149)
VLDL Cholesterol Cal: 27 mg/dL (ref 5–40)

## 2021-12-29 MED ORDER — ROSUVASTATIN CALCIUM 40 MG PO TABS: 40.0000 mg | ORAL_TABLET | Freq: Every day | ORAL | 3 refills | Status: DC

## 2022-01-03 ENCOUNTER — Other Ambulatory Visit: Payer: Self-pay | Admitting: Nurse Practitioner

## 2022-01-03 DIAGNOSIS — E890 Postprocedural hypothyroidism: Secondary | ICD-10-CM

## 2022-01-19 ENCOUNTER — Other Ambulatory Visit: Payer: Self-pay | Admitting: Nurse Practitioner

## 2022-01-19 DIAGNOSIS — E113299 Type 2 diabetes mellitus with mild nonproliferative diabetic retinopathy without macular edema, unspecified eye: Secondary | ICD-10-CM

## 2022-02-10 ENCOUNTER — Encounter: Payer: Self-pay | Admitting: Nurse Practitioner

## 2022-02-10 ENCOUNTER — Ambulatory Visit (INDEPENDENT_AMBULATORY_CARE_PROVIDER_SITE_OTHER): Payer: Medicare Other | Admitting: Nurse Practitioner

## 2022-02-10 VITALS — BP 111/70 | HR 67 | Ht 66.0 in | Wt 167.0 lb

## 2022-02-10 DIAGNOSIS — I10 Essential (primary) hypertension: Secondary | ICD-10-CM | POA: Diagnosis not present

## 2022-02-10 DIAGNOSIS — C44310 Basal cell carcinoma of skin of unspecified parts of face: Secondary | ICD-10-CM | POA: Diagnosis not present

## 2022-02-10 DIAGNOSIS — E785 Hyperlipidemia, unspecified: Secondary | ICD-10-CM

## 2022-02-10 DIAGNOSIS — R5382 Chronic fatigue, unspecified: Secondary | ICD-10-CM

## 2022-02-10 DIAGNOSIS — E1169 Type 2 diabetes mellitus with other specified complication: Secondary | ICD-10-CM

## 2022-02-10 DIAGNOSIS — E113299 Type 2 diabetes mellitus with mild nonproliferative diabetic retinopathy without macular edema, unspecified eye: Secondary | ICD-10-CM

## 2022-02-10 DIAGNOSIS — C439 Malignant melanoma of skin, unspecified: Secondary | ICD-10-CM | POA: Diagnosis not present

## 2022-02-10 DIAGNOSIS — E890 Postprocedural hypothyroidism: Secondary | ICD-10-CM

## 2022-02-10 DIAGNOSIS — E118 Type 2 diabetes mellitus with unspecified complications: Secondary | ICD-10-CM

## 2022-02-10 DIAGNOSIS — R5383 Other fatigue: Secondary | ICD-10-CM | POA: Insufficient documentation

## 2022-02-10 MED ORDER — GVOKE HYPOPEN 2-PACK 1 MG/0.2ML ~~LOC~~ SOAJ: 1.0000 mg | SUBCUTANEOUS | 1 refills | Status: DC | PRN

## 2022-02-10 MED ORDER — LEVEMIR FLEXPEN 100 UNIT/ML ~~LOC~~ SOPN: PEN_INJECTOR | SUBCUTANEOUS | 0 refills | Status: DC

## 2022-02-10 MED ORDER — EZETIMIBE 10 MG PO TABS: 10.0000 mg | ORAL_TABLET | Freq: Every day | ORAL | 3 refills | Status: DC

## 2022-02-10 NOTE — Assessment & Plan Note (Signed)
Has been getting skin checks every 6 months ?Needs referral to a new dermatologist ?Referral sent today ?

## 2022-02-10 NOTE — Assessment & Plan Note (Signed)
Lab Results  ?Component Value Date  ? CHOL 171 12/28/2021  ? HDL 51 12/28/2021  ? [place] 93 12/28/2021  ? TRIG 158 (H) 12/28/2021  ? CHOLHDL 3.4 12/28/2021  ?Patient would like to stop Crestor due to muscle weakness. ?Start Zetia 10 mg daily follow-up in 8 weeks to recheck labs ?Might need Repatha at one-point, plan discussed with patient. ?

## 2022-02-10 NOTE — Assessment & Plan Note (Addendum)
BP Readings from Last 3 Encounters:  ?02/10/22 111/70  ?12/28/21 136/68  ?11/09/21 120/64  ?Blood pressure well controlled on amlodipine 2.5 mg daily, Vasotec 5 mg daily, hydrochlorothiazide 25 mg tablet daily. ?Continue current medications. ?DASH diet advised, exercise at least 150 minutes weekly as tolerated ?CMP today ?

## 2022-02-10 NOTE — Patient Instructions (Addendum)
Please hold night time dose of levemir if your blood sugar is less than 200. Goal for fasting blood sugar ranges from 80 to 120 and 2 hours after any meal or at bedtime should be between 130 to 170.  ? ?Please stop taking Crestor, start taking zetia '10mg'$  daily.  ?Please get your shingles vaccine at your pharmacy  ? ? ?It is important that you exercise regularly at least 30 minutes 5 times a week.  ?Think about what you will eat, plan ahead. ?Choose " clean, green, fresh or frozen" over canned, processed or packaged foods which are more sugary, salty and fatty. ?70 to 75% of food eaten should be vegetables and fruit. ?Three meals at set times with snacks allowed between meals, but they must be fruit or vegetables. ?Aim to eat over a 12 hour period , example 7 am to 7 pm, and STOP after  your last meal of the day. ?Drink water,generally about 64 ounces per day, no other drink is as healthy. Fruit juice is best enjoyed in a healthy way, by EATING the fruit. ? ?Thanks for choosing [facility], we consider it a privelige to serve you.  ?

## 2022-02-10 NOTE — Assessment & Plan Note (Signed)
Chronic condition ?Could be due to patient's advancing age and her chronic medical conditions.  ?We will check CBC A1c thyroid function today ?Patient encouraged to exercise at least 30 minutes daily as tolerated ?

## 2022-02-10 NOTE — Progress Notes (Signed)
? ?Angela Rose     MRN: [id number]      DOB: 09-Aug-1946 ? ? ?HPI ?Angela Rose with past medical history of essential hypertension, carotid artery plaque, CAD, hyperlipidemia, hypothyroidism, type 2 diabetes with complication, basal cell carcinoma, is here for follow up and re-evaluation of chronic medical conditions, medication management  ? ?Type 2 diabetes patient states that states that she has had low CBG's had CBG of 40 one day , this AM  it was 70. She would like to see an endocrinology. She has been taking Levemir 28 units in the AM, Levemir 24 units PM.  Patient stated that she has been taking glucose tablets for her hypoglycemia, denies polyphagia, polyuria, confusion, passing out. ? ? ?Hyperlipidemia patient complaining of feeling fatigued , muscle weakness since she started taking crestor, states that her previous PCP did not want her to be on a statin, Crestor was recently increased from 20 mg to 40 mg by cardiology,  she would like to to stop taking crestor , would consider zetia.  I told patient that Zetia might not be enough to control her hyperlipidemia and that she might need Repatha at one-point.  Her spouse is also on Repatha.  ? ?Patient stated that she had melanoma on her face, had a proceure to remove it,  gets checked every 6 months, her dermatologist left the practice she was going to  need a new dermatology.  Currently denies any rashes or skin breakdown ? ?Fatigue.  Chronic condition, check CBC with differential, A1c, thyroid function today, patient encouraged to exercise daily as tolerated.  ? ?ROS ?Denies recent fever or chills. ?Denies sinus pressure, nasal congestion, ear pain or sore throat. ?Denies chest congestion, productive cough or wheezing. ?Denies chest pains, palpitations and leg swelling ?Denies abdominal pain, nausea, vomiting,diarrhea or constipation.   ?Denies dysuria, frequency, hesitancy or incontinence. ?Denies joint pain, swelling and limitation in  mobility. ?Denies depression, anxiety or insomnia. ? ? ? ?PE ? ?BP 111/70 (BP Location: Right Arm, Patient Position: Sitting, Cuff Size: Large)   Pulse 67   Ht '5\' 6"'$  (1.676 m)   Wt 167 lb (75.8 kg)   SpO2 100%   BMI 26.95 kg/m?  ? ?Patient alert and oriented and in no cardiopulmonary distress. ? ?Chest: Clear to auscultation bilaterally. ? ?CVS: S1, S2 no murmurs, no S3.Regular rate. ? ?ABD: Soft non tender.  ? ?Ext: No edema ? ?MS: Adequate ROM spine, shoulders, hips and knees. ? ?Skin: Intact, no ulcerations or rash noted. ? ?Psych: Good eye contact, normal affect. Memory intact not anxious or depressed appearing. ? ? ?Assessment & Plan ? ?Essential (primary) hypertension ?BP Readings from Last 3 Encounters:  ?02/10/22 111/70  ?12/28/21 136/68  ?11/09/21 120/64  ?Blood pressure well controlled on amlodipine 2.5 mg daily, Vasotec 5 mg daily, hydrochlorothiazide 25 mg tablet daily. ?Continue current medications. ?DASH diet advised, exercise at least 150 minutes weekly as tolerated ?CMP today ? ?Hypothyroidism, postablative ?Currently on levothyroxine 50 mcg tablets daily ?Check thyroid levels today ?Lab Results  ?Component Value Date  ? TSH 1.990 09/30/2021  ? ? ? ?Type II diabetes mellitus with complication (HCC) ?On metformin 1000 mg twice daily,takes  Levemir 28 units in the morning, Levemir 24 units in the evening, ?Reports having low CBG in the morning ?Patient told to hold night dose of Levemir if CBG is less than 200, referral sent to endocrinology at the request of patient. ?Check A1c today ?Goal for fasting blood sugar ranges from 80  to 120 and 2 hours after any meal or at bedtime should be between 130 to 170. ?Gvoke hypopen ordered to be used PRN for hypoglycemia, indication for medication and administration dicussed with pt and her husband.  ? ?Hyperlipidemia associated with type 2 diabetes mellitus ([place]) ?Lab Results  ?Component Value Date  ? CHOL 171 12/28/2021  ? HDL 51 12/28/2021  ? [place] 93  12/28/2021  ? TRIG 158 (H) 12/28/2021  ? CHOLHDL 3.4 12/28/2021  ?Patient would like to stop Crestor due to muscle weakness. ?Start Zetia 10 mg daily follow-up in 8 weeks to recheck labs ?Might need Repatha at one-point, plan discussed with patient. ? ?Basal cell carcinoma ?Has been getting skin checks every 6 months ?Needs referral to a new dermatologist ?Referral sent today  ?

## 2022-02-10 NOTE — Assessment & Plan Note (Addendum)
On metformin 1000 mg twice daily,takes  Levemir 28 units in the morning, Levemir 24 units in the evening, ?Reports having low CBG in the morning ?Patient told to hold night dose of Levemir if CBG is less than 200, referral sent to endocrinology at the request of patient. ?Check A1c today ?Goal for fasting blood sugar ranges from 80 to 120 and 2 hours after any meal or at bedtime should be between 130 to 170. ?Gvoke hypopen ordered to be used PRN for hypoglycemia, indication for medication and administration dicussed with pt and her husband.  ?

## 2022-02-10 NOTE — Assessment & Plan Note (Signed)
Currently on levothyroxine 50 mcg tablets daily ?Check thyroid levels today ?Lab Results  ?Component Value Date  ? TSH 1.990 09/30/2021  ? ? ?

## 2022-02-11 ENCOUNTER — Other Ambulatory Visit: Payer: Self-pay | Admitting: Nurse Practitioner

## 2022-02-11 DIAGNOSIS — N179 Acute kidney failure, unspecified: Secondary | ICD-10-CM

## 2022-02-11 DIAGNOSIS — I10 Essential (primary) hypertension: Secondary | ICD-10-CM

## 2022-02-11 LAB — LIPID PANEL
Chol/HDL Ratio: 3.3 ratio (ref 0.0–4.4)
Cholesterol, Total: 154 mg/dL (ref 100–199)
HDL: 47 mg/dL (ref >39)
LDL Chol Calc (NIH): 84 mg/dL (ref 0–99)
Triglycerides: 131 mg/dL (ref 0–149)
VLDL Cholesterol Cal: 23 mg/dL (ref 5–40)

## 2022-02-11 LAB — CMP14+EGFR
ALT: 21 IU/L (ref 0–32)
AST: 34 IU/L (ref 0–40)
Albumin/Globulin Ratio: 1.5 (ref 1.2–2.2)
Albumin: 4.4 g/dL (ref 3.7–4.7)
Alkaline Phosphatase: 71 IU/L (ref 44–121)
BUN/Creatinine Ratio: 18 (ref 12–28)
BUN: 25 mg/dL (ref 8–27)
Bilirubin Total: 0.3 mg/dL (ref 0.0–1.2)
CO2: 26 mmol/L (ref 20–29)
Calcium: 9.9 mg/dL (ref 8.7–10.3)
Chloride: 98 mmol/L (ref 96–106)
Creatinine, Ser: 1.42 mg/dL — ABNORMAL HIGH (ref 0.57–1.00)
Globulin, Total: 2.9 g/dL (ref 1.5–4.5)
Glucose: 102 mg/dL — ABNORMAL HIGH (ref 70–99)
Potassium: 4 mmol/L (ref 3.5–5.2)
Sodium: 140 mmol/L (ref 134–144)
Total Protein: 7.3 g/dL (ref 6.0–8.5)
eGFR: 38 mL/min/{1.73_m2} — ABNORMAL LOW (ref >59)

## 2022-02-11 LAB — CBC WITH DIFFERENTIAL/PLATELET
Basophils Absolute: 0.1 10*3/uL (ref 0.0–0.2)
Basos: 1 %
EOS (ABSOLUTE): 0.2 10*3/uL (ref 0.0–0.4)
Eos: 2 %
Hematocrit: 34.8 % (ref 34.0–46.6)
Hemoglobin: 11.4 g/dL (ref 11.1–15.9)
Immature Grans (Abs): 0 10*3/uL (ref 0.0–0.1)
Immature Granulocytes: 0 %
Lymphocytes Absolute: 2.2 10*3/uL (ref 0.7–3.1)
Lymphs: 27 %
MCH: 28.4 pg (ref 26.6–33.0)
MCHC: 32.8 g/dL (ref 31.5–35.7)
MCV: 87 fL (ref 79–97)
Monocytes Absolute: 0.8 10*3/uL (ref 0.1–0.9)
Monocytes: 10 %
Neutrophils Absolute: 4.9 10*3/uL (ref 1.4–7.0)
Neutrophils: 60 %
Platelets: 318 10*3/uL (ref 150–450)
RBC: 4.01 x10E6/uL (ref 3.77–5.28)
RDW: 14 % (ref 11.7–15.4)
WBC: 8.2 10*3/uL (ref 3.4–10.8)

## 2022-02-11 LAB — HEMOGLOBIN A1C
Est. average glucose Bld gHb Est-mCnc: 180 mg/dL
Hgb A1c MFr Bld: 7.9 % — ABNORMAL HIGH (ref 4.8–5.6)

## 2022-02-11 LAB — TSH: TSH: 3.17 u[IU]/mL (ref 0.450–4.500)

## 2022-02-11 NOTE — Progress Notes (Signed)
Please review labs with patient ?Kidney function slightly decreased, patient should avoid ibuprofen drink at least 64 ounces of water daily, patient should have BMP checked in 2 weeks, will discontinue metformin if kidney function does not improve and have pt follow up with endo  ? ?A1c went up from 7.7 to 7.9, continue current medications as prescribed, avoid sugar sweets order follow-up with endocrinology as discussed. ? ?LDL is not at goal of less than 55, take Zetia 10 mg daily as discussed, she might need Repatha injection in the long run.  ? ?CBC is normal.

## 2022-02-11 NOTE — Progress Notes (Signed)
Thyroid function is normal continue current dose of Synthroid

## 2022-02-15 ENCOUNTER — Telehealth: Payer: Self-pay | Admitting: Nurse Practitioner

## 2022-02-15 NOTE — Telephone Encounter (Signed)
Pt husband called stating she was called in a medication she does not need. States they are both diabetic & they do not have dm bad enough to require emergency medication & they are unable to afford this medication and are unable to send it back. Please advise ? ?Medication: Glucagon ? ?[facility]: [facility] ? ? ?

## 2022-02-15 NOTE — Telephone Encounter (Signed)
Please advise 

## 2022-02-16 NOTE — Telephone Encounter (Signed)
Called Angela Rose pt's partner it said call can not be completed ? ?

## 2022-02-17 ENCOUNTER — Encounter: Payer: Self-pay | Admitting: Nurse Practitioner

## 2022-02-17 ENCOUNTER — Other Ambulatory Visit: Payer: Self-pay | Admitting: Nurse Practitioner

## 2022-02-17 ENCOUNTER — Ambulatory Visit (INDEPENDENT_AMBULATORY_CARE_PROVIDER_SITE_OTHER): Payer: Medicare Other | Admitting: Nurse Practitioner

## 2022-02-17 VITALS — BP 105/65 | HR 78 | Ht 66.0 in | Wt 166.0 lb

## 2022-02-17 DIAGNOSIS — N183 Chronic kidney disease, stage 3 unspecified: Secondary | ICD-10-CM

## 2022-02-17 DIAGNOSIS — E1122 Type 2 diabetes mellitus with diabetic chronic kidney disease: Secondary | ICD-10-CM

## 2022-02-17 DIAGNOSIS — Z794 Long term (current) use of insulin: Secondary | ICD-10-CM | POA: Diagnosis not present

## 2022-02-17 DIAGNOSIS — I10 Essential (primary) hypertension: Secondary | ICD-10-CM | POA: Diagnosis not present

## 2022-02-17 DIAGNOSIS — N1832 Chronic kidney disease, stage 3b: Secondary | ICD-10-CM | POA: Diagnosis not present

## 2022-02-17 DIAGNOSIS — E890 Postprocedural hypothyroidism: Secondary | ICD-10-CM

## 2022-02-17 DIAGNOSIS — E118 Type 2 diabetes mellitus with unspecified complications: Secondary | ICD-10-CM | POA: Diagnosis not present

## 2022-02-17 DIAGNOSIS — E113299 Type 2 diabetes mellitus with mild nonproliferative diabetic retinopathy without macular edema, unspecified eye: Secondary | ICD-10-CM | POA: Diagnosis not present

## 2022-02-17 HISTORY — DX: Chronic kidney disease, stage 3 unspecified: N18.30

## 2022-02-17 MED ORDER — METFORMIN HCL ER 500 MG PO TB24: 1000.0000 mg | ORAL_TABLET | Freq: Every day | ORAL | 3 refills | Status: DC

## 2022-02-17 MED ORDER — LEVEMIR FLEXPEN 100 UNIT/ML ~~LOC~~ SOPN: 20.0000 [IU] | PEN_INJECTOR | Freq: Every day | SUBCUTANEOUS | 3 refills | Status: DC

## 2022-02-17 MED ORDER — LEVEMIR FLEXPEN 100 UNIT/ML ~~LOC~~ SOPN: 20.0000 [IU] | PEN_INJECTOR | Freq: Every day | SUBCUTANEOUS | 0 refills | Status: DC

## 2022-02-17 NOTE — Progress Notes (Signed)
? ?                                                    Endocrinology Consult Note  ?     02/17/2022, 4:44 PM ? ? ?Subjective:  ? ? Patient ID: Angela Rose, female    DOB: 1946-08-24.  ?Angela Rose is being seen in consultation for management of currently uncontrolled symptomatic diabetes requested by  Renee Rival, FNP. ? ? ?Past Medical History:  ?Diagnosis Date  ? Anxiety   ? Cancer [facility])   ? melanoma face  ? Depression   ? Diabetes mellitus without complication ([place])   ? Fracture of tibial plateau 12/31/2015  ? Hyperlipidemia   ? Hypertension   ? ? ?Past Surgical History:  ?Procedure Laterality Date  ? ABDOMINAL HYSTERECTOMY    ? APPENDECTOMY    ? BASAL CELL CARCINOMA EXCISION    ? BREAST BIOPSY Right   ? needle bx-neg  ? CATARACT EXTRACTION, BILATERAL Bilateral 11/23/2018  ? CORONARY ARTERY BYPASS GRAFT  1996  ? x 3  ? MELANOMA EXCISION    ? PARTIAL HYSTERECTOMY  1976  ? ovaries remain  ? ? ?Social History  ? ?Socioeconomic History  ? Marital status: Significant Other  ?  Spouse name: Not on file  ? Number of children: 3  ? Years of education: Not on file  ? Highest education level: Some college, no degree  ?Occupational History  ? Occupation: retired  ?Tobacco Use  ? Smoking status: Former  ?  Types: Cigarettes  ?  Quit date: 06/11/1982  ?  Years since quitting: 39.7  ? Smokeless tobacco: Never  ? Tobacco comments:  ?  smoking cessation materials not required  ?Vaping Use  ? Vaping Use: Never used  ?Substance and Sexual Activity  ? Alcohol use: Yes  ?  Alcohol/week: 0.0 standard drinks  ?  Comment: rare 1 wine glass of wine  ? Drug use: No  ? Sexual activity: Not on file  ?Other Topics Concern  ? Not on file  ?Social History Narrative  ? Not on file  ? ?Social Determinants of Health  ? ?Financial Resource Strain: Low Risk   ? Difficulty of Paying Living Expenses: Not hard at all  ?Food Insecurity: No Food Insecurity  ? Worried About Charity fundraiser in the Last Year:  Never true  ? Ran Out of Food in the Last Year: Never true  ?Transportation Needs: No Transportation Needs  ? Lack of Transportation (Medical): No  ? Lack of Transportation (Non-Medical): No  ?Physical Activity: Inactive  ? Days of Exercise per Week: 0 days  ? Minutes of Exercise per Session: 0 min  ?Stress: Stress Concern Present  ? Feeling of Stress : To some extent  ?Social Connections: Moderately Isolated  ? Frequency of Communication with Friends and Family: More than three times a week  ? Frequency of Social Gatherings with Friends and Family: Once a week  ? Attends Religious Services: Never  ? Active Member of Clubs or Organizations: No  ? Attends Archivist Meetings: Never  ? Marital Status: Living with partner  ? ? ?Family History  ?Problem Relation Age of Onset  ? Skin cancer Mother   ? Squamous cell carcinoma Mother   ? Basal cell carcinoma Mother   ? Heart  disease Father   ? CAD Father   ? Breast cancer Neg Hx   ? ? ?Outpatient Encounter Medications as of 02/17/2022  ?Medication Sig  ? amLODipine (NORVASC) 2.5 MG tablet TAKE 1 TABLET BY MOUTH  DAILY  ? aspirin 81 MG tablet Take by mouth every evening.   ? enalapril (VASOTEC) 5 MG tablet TAKE 1 TABLET BY MOUTH  TWICE DAILY  ? ezetimibe (ZETIA) 10 MG tablet Take 1 tablet (10 mg total) by mouth daily.  ? FLUoxetine (PROZAC) 40 MG capsule TAKE 1 CAPSULE BY MOUTH  DAILY  ? glucose blood test strip Frequency:PHARMDIR   Dosage:0.0     Instructions:  Note:testing 3-4xqd, DX Code 250.00 Dose: 1  ? hydrochlorothiazide (HYDRODIURIL) 25 MG tablet TAKE 1 TABLET BY MOUTH  DAILY  ? insulin detemir (LEVEMIR FLEXPEN) 100 UNIT/ML FlexPen Inject 20 Units into the skin at bedtime.  ? Insulin Pen Needle (BD PEN NEEDLE NANO U/F) 32G X 4 MM MISC USE 1  TWICE DAILY  ? levothyroxine (SYNTHROID) 50 MCG tablet TAKE 1 TABLET BY MOUTH DAILY  BEFORE BREAKFAST  ? metFORMIN (GLUCOPHAGE-XR) 500 MG 24 hr tablet Take 2 tablets (1,000 mg total) by mouth daily with breakfast.  ?  sodium chloride (OCEAN) 0.65 % SOLN nasal spray Place 1 spray into both nostrils as needed for congestion. (Patient not taking: Reported on 02/10/2022)  ? [DISCONTINUED] Azelastine-Fluticasone 137-50 MCG/ACT SUSP Place 1 spray into the nose every 12 (twelve) hours. (Patient not taking: Reported on 02/10/2022)  ? [DISCONTINUED] Glucagon (GVOKE HYPOPEN 2-PACK) 1 MG/0.2ML SOAJ Inject 1 mg into the skin as needed.  ? [DISCONTINUED] insulin detemir (LEVEMIR FLEXPEN) 100 UNIT/ML FlexPen INJECT 30 UNITS INTO THE SKIN TWICE DAILY. Please hold night time dose of levemir if blood sugar is less than 200. (Patient taking differently: every morning. INJECT 30 UNITS INTO THE SKIN TWICE DAILY. Please hold night time dose of levemir if blood sugar is less than 200.)  ? [DISCONTINUED] insulin detemir (LEVEMIR FLEXPEN) 100 UNIT/ML FlexPen Inject 20 Units into the skin at bedtime. INJECT 30 UNITS INTO THE SKIN TWICE DAILY. Please hold night time dose of levemir if blood sugar is less than 200.  ? [DISCONTINUED] metFORMIN (GLUCOPHAGE-XR) 500 MG 24 hr tablet Take 2 tablets (1,000 mg total) by mouth 2 (two) times daily.  ? [DISCONTINUED] metFORMIN (GLUCOPHAGE-XR) 500 MG 24 hr tablet TAKE 2 TABLETS BY MOUTH TWICE  DAILY  ? ?No facility-administered encounter medications on file as of 02/17/2022.  ? ? ?ALLERGIES: ?Allergies  ?Allergen Reactions  ? Propoxyphene Nausea And Vomiting  ?  Other reaction(s): Vomiting  ? Lidocaine-Epinephrine   ?  Other reaction(s): Other (See Comments) ?palpitatins and chest pain  ? Other Other (See Comments)  ?  sutures - in her hand-very irritated  ? Tape Rash  ?  BANDAIDS. BANDAIDS  ? Xylocaine  [Lidocaine Hcl]   ?  Other reaction(s): Other (See Comments) ?palpitatins and chest pain  ? Jardiance [Empagliflozin] Other (See Comments)  ?  Recurrent vaginal infections  ? ? ?VACCINATION STATUS: ?Immunization History  ?Administered Date(s) Administered  ? Fluad Quad(high Dose 65+) 07/25/2019, 08/20/2020, 09/30/2021  ?  Influenza, High Dose Seasonal PF 06/20/2017, 06/23/2018  ? Influenza,inj,Quad PF,6+ Mos 08/14/2015, 09/27/2016  ? PFIZER Comirnaty(Gray Top)Covid-19 Tri-Sucrose Vaccine 12/01/2019, 12/23/2019  ? PFIZER(Purple Top)SARS-COV-2 Vaccination 12/01/2019, 12/23/2019, 07/20/2020, 04/24/2021  ? Pneumococcal Conjugate-13 02/16/2016  ? Pneumococcal Polysaccharide-23 11/15/2008, 10/14/2014  ? ? ?Diabetes ?She presents for her initial diabetic visit. She has type 2 diabetes  mellitus. Onset time: Diagnosed at approx age of 44. Her disease course has been fluctuating. Hypoglycemia symptoms include nervousness/anxiousness, sweats and tremors. Associated symptoms include fatigue, polyuria and weight loss. Hypoglycemia complications include nocturnal hypoglycemia. Symptoms are stable. Diabetic complications include heart disease and nephropathy. Risk factors for coronary artery disease include diabetes mellitus, dyslipidemia, family history, post-menopausal and sedentary lifestyle. Current diabetic treatment includes insulin injections and oral agent (monotherapy). She is compliant with treatment most of the time. Her weight is decreasing steadily. She is following a generally healthy diet. When asked about meal planning, she reported none. She has not had a previous visit with a dietitian. She rarely participates in exercise. Her home blood glucose trend is fluctuating dramatically. (She presents today for her consultation with her meter, no logs, to review.  Her most recent A1c on 5/3 was 7.9%, increasing slightly from last reading of 7.7%.  She routinely monitors glucose twice daily.  She drinks mostly water and lemonade, eats 2 meals per day and rarely eats snacks.  She does not engage in routine physical activity, says she is very fatigued recently.  She is UTD on eye exam, has seen podiatry in the distant past, wants to get set up with someone local.  She does note some low glucose readings, has only been taking one injection of  her Levemir scheduled to be 30 units BID.) An ACE inhibitor/angiotensin II receptor blocker is being taken. She sees a podiatrist.Eye exam is current.  ? ? ?Review of systems ? ?Constitutional: + [place]

## 2022-02-17 NOTE — Patient Instructions (Signed)

## 2022-02-26 NOTE — Telephone Encounter (Signed)
Called call can't be completed

## 2022-03-02 NOTE — Telephone Encounter (Signed)
Spoke with pt advised of message pt verbalized understanding  

## 2022-03-03 ENCOUNTER — Ambulatory Visit: Payer: Medicare Other | Admitting: Nurse Practitioner

## 2022-03-03 ENCOUNTER — Encounter: Payer: Self-pay | Admitting: Nurse Practitioner

## 2022-03-03 VITALS — BP 126/58 | HR 72 | Ht 66.0 in | Wt 166.0 lb

## 2022-03-03 DIAGNOSIS — E113299 Type 2 diabetes mellitus with mild nonproliferative diabetic retinopathy without macular edema, unspecified eye: Secondary | ICD-10-CM

## 2022-03-03 DIAGNOSIS — Z08 Encounter for follow-up examination after completed treatment for malignant neoplasm: Secondary | ICD-10-CM | POA: Diagnosis not present

## 2022-03-03 DIAGNOSIS — Z85820 Personal history of malignant melanoma of skin: Secondary | ICD-10-CM | POA: Diagnosis not present

## 2022-03-03 DIAGNOSIS — Z1283 Encounter for screening for malignant neoplasm of skin: Secondary | ICD-10-CM | POA: Diagnosis not present

## 2022-03-03 DIAGNOSIS — D225 Melanocytic nevi of trunk: Secondary | ICD-10-CM | POA: Diagnosis not present

## 2022-03-03 DIAGNOSIS — E890 Postprocedural hypothyroidism: Secondary | ICD-10-CM

## 2022-03-03 MED ORDER — LEVEMIR FLEXPEN 100 UNIT/ML ~~LOC~~ SOPN: 16.0000 [IU] | PEN_INJECTOR | Freq: Every day | SUBCUTANEOUS | 3 refills | Status: DC

## 2022-03-03 NOTE — Progress Notes (Signed)
Endocrinology Follow Up Note       03/03/2022, 3:25 PM   Subjective:    Patient ID: Angela Rose, female    DOB: Mar 25, 1946.  Angela Rose is being seen in follow up after being seen in consultation for management of currently uncontrolled symptomatic diabetes requested by  Renee Rival, FNP.   Past Medical History:  Diagnosis Date   Anxiety    Cancer ([place])    melanoma face   Depression    Diabetes mellitus without complication ([place])    Fracture of tibial plateau 12/31/2015   Hyperlipidemia    Hypertension     Past Surgical History:  Procedure Laterality Date   ABDOMINAL HYSTERECTOMY     APPENDECTOMY     BASAL CELL CARCINOMA EXCISION     BREAST BIOPSY Right    needle bx-neg   CATARACT EXTRACTION, BILATERAL Bilateral 11/23/2018   CORONARY ARTERY BYPASS GRAFT  1996   x 3   MELANOMA EXCISION     PARTIAL HYSTERECTOMY  1976   ovaries remain    Social History   Socioeconomic History   Marital status: Significant Other    Spouse name: Not on file   Number of children: 3   Years of education: Not on file   Highest education level: Some college, no degree  Occupational History   Occupation: retired  Tobacco Use   Smoking status: Former    Types: Cigarettes    Quit date: 06/11/1982    Years since quitting: 39.7   Smokeless tobacco: Never   Tobacco comments:    smoking cessation materials not required  Vaping Use   Vaping Use: Never used  Substance and Sexual Activity   Alcohol use: Yes    Alcohol/week: 0.0 standard drinks    Comment: rare 1 wine glass of wine   Drug use: No   Sexual activity: Not on file  Other Topics Concern   Not on file  Social History Narrative   Not on file   Social Determinants of Health   Financial Resource Strain: Low Risk    Difficulty of Paying Living Expenses: Not hard at all  Food Insecurity: No Food Insecurity   Worried About Paediatric nurse in the Last Year: Never true   [place] in the Last Year: Never true  Transportation Needs: No Transportation Needs   Lack of Transportation (Medical): No   Lack of Transportation (Non-Medical): No  Physical Activity: Inactive   Days of Exercise per Week: 0 days   Minutes of Exercise per Session: 0 min  Stress: Stress Concern Present   Feeling of Stress : To some extent  Social Connections: Moderately Isolated   Frequency of Communication with Friends and Family: More than three times a week   Frequency of Social Gatherings with Friends and Family: Once a week   Attends Religious Services: Never   Marine scientist or Organizations: No   Attends Music therapist: Never   Marital Status: Living with partner    Family History  Problem Relation Age of Onset   Skin cancer Mother    Squamous cell carcinoma Mother    Basal  cell carcinoma Mother    Heart disease Father    CAD Father    Breast cancer Neg Hx     Outpatient Encounter Medications as of 03/03/2022  Medication Sig   amLODipine (NORVASC) 2.5 MG tablet TAKE 1 TABLET BY MOUTH  DAILY   aspirin 81 MG tablet Take by mouth every evening.    enalapril (VASOTEC) 5 MG tablet TAKE 1 TABLET BY MOUTH  TWICE DAILY   ezetimibe (ZETIA) 10 MG tablet Take 1 tablet (10 mg total) by mouth daily.   FLUoxetine (PROZAC) 40 MG capsule TAKE 1 CAPSULE BY MOUTH  DAILY   glucose blood test strip Frequency:PHARMDIR   Dosage:0.0     Instructions:  Note:testing 3-4xqd, DX Code 250.00 Dose: 1   hydrochlorothiazide (HYDRODIURIL) 25 MG tablet TAKE 1 TABLET BY MOUTH  DAILY   insulin detemir (LEVEMIR FLEXPEN) 100 UNIT/ML FlexPen Inject 16 Units into the skin at bedtime.   Insulin Pen Needle (BD PEN NEEDLE NANO U/F) 32G X 4 MM MISC USE 1  TWICE DAILY   levothyroxine (SYNTHROID) 50 MCG tablet TAKE 1 TABLET BY MOUTH DAILY  BEFORE BREAKFAST   metFORMIN (GLUCOPHAGE-XR) 500 MG 24 hr tablet Take 2 tablets (1,000 mg total) by  mouth daily with breakfast.   sodium chloride (OCEAN) 0.65 % SOLN nasal spray Place 1 spray into both nostrils as needed for congestion. (Patient not taking: Reported on 02/10/2022)   [DISCONTINUED] insulin detemir (LEVEMIR FLEXPEN) 100 UNIT/ML FlexPen Inject 20 Units into the skin at bedtime.   No facility-administered encounter medications on file as of 03/03/2022.    ALLERGIES: Allergies  Allergen Reactions   Propoxyphene Nausea And Vomiting    Other reaction(s): Vomiting   Lidocaine-Epinephrine     Other reaction(s): Other (See Comments) palpitatins and chest pain   Other Other (See Comments)    sutures - in her hand-very irritated   Tape Rash    BANDAIDS. BANDAIDS   Xylocaine  [Lidocaine Hcl]     Other reaction(s): Other (See Comments) palpitatins and chest pain   Jardiance [Empagliflozin] Other (See Comments)    Recurrent vaginal infections    VACCINATION STATUS: Immunization History  Administered Date(s) Administered   Fluad Quad(high Dose 65+) 07/25/2019, 08/20/2020, 09/30/2021   Influenza, High Dose Seasonal PF 06/20/2017, 06/23/2018   Influenza,inj,Quad PF,6+ Mos 08/14/2015, 09/27/2016   PFIZER Comirnaty(Gray Top)Covid-19 Tri-Sucrose Vaccine 12/01/2019, 12/23/2019   PFIZER(Purple Top)SARS-COV-2 Vaccination 12/01/2019, 12/23/2019, 07/20/2020, 04/24/2021   Pneumococcal Conjugate-13 02/16/2016   Pneumococcal Polysaccharide-23 11/15/2008, 10/14/2014    Diabetes She presents for her follow-up diabetic visit. She has type 2 diabetes mellitus. Onset time: Diagnosed at approx age of 65. Her disease course has been improving. There are no hypoglycemic associated symptoms. Associated symptoms include fatigue. Pertinent negatives for diabetes include no polyuria and no weight loss. There are no hypoglycemic complications. Symptoms are improving. Diabetic complications include heart disease and nephropathy. Risk factors for coronary artery disease include diabetes mellitus,  dyslipidemia, family history, post-menopausal and sedentary lifestyle. Current diabetic treatment includes oral agent (monotherapy) and insulin injections. She is compliant with treatment most of the time. Her weight is stable. She is following a generally healthy diet. When asked about meal planning, she reported none. She has not had a previous visit with a dietitian. She rarely participates in exercise. Her home blood glucose trend is decreasing steadily. Her overall blood glucose range is 110-130 mg/dl. (She presents today with her meter and logs showing improved stable glycemic profile with no hypoglycemia.  She was  not due for another A1c today.  She reports she feels much better, has adopted a healthier diet and even has the energy to work in her garden now.  Analysis of her meter shows 7-day average of 134, 14-day average of 132, 30-day average of 127.) An ACE inhibitor/angiotensin II receptor blocker is being taken. She sees a podiatrist.Eye exam is current.    Review of systems  Constitutional: + Minimally fluctuating body weight,  current Body mass index is 26.79 kg/m. , no fatigue, no subjective hyperthermia, no subjective hypothermia Eyes: no blurry vision, no xerophthalmia ENT: no sore throat, no nodules palpated in throat, no dysphagia/odynophagia, no hoarseness Cardiovascular: no chest pain, no shortness of breath, no palpitations, no leg swelling Respiratory: no cough, no shortness of breath Gastrointestinal: no nausea/vomiting/diarrhea Musculoskeletal: no muscle/joint aches Skin: no rashes, no hyperemia Neurological: no tremors, no numbness, no tingling, no dizziness Psychiatric: no depression, no anxiety  Objective:     BP (!) 126/58   Pulse 72   Ht '5\' 6"'$  (1.676 m)   Wt 166 lb (75.3 kg)   BMI 26.79 kg/m   Wt Readings from Last 3 Encounters:  03/03/22 166 lb (75.3 kg)  02/17/22 166 lb (75.3 kg)  02/10/22 167 lb (75.8 kg)     BP Readings from Last 3 Encounters:   03/03/22 (!) 126/58  02/17/22 105/65  02/10/22 111/70      Physical Exam- Limited  Constitutional:  Body mass index is 26.79 kg/m. , not in acute distress, normal state of mind Eyes:  EOMI, no exophthalmos Neck: Supple Cardiovascular: RRR, no murmurs, rubs, or gallops, no edema Respiratory: Adequate breathing efforts, no crackles, rales, rhonchi, or wheezing Musculoskeletal: no gross deformities, strength intact in all four extremities, no gross restriction of joint movements Skin:  no rashes, no hyperemia Neurological: no tremor with outstretched hands    CMP ( most recent) CMP     Component Value Date/Time   NA 140 02/10/2022 0954   K 4.0 02/10/2022 0954   CL 98 02/10/2022 0954   CO2 26 02/10/2022 0954   GLUCOSE 102 (H) 02/10/2022 0954   BUN 25 02/10/2022 0954   CREATININE 1.42 (H) 02/10/2022 0954   CALCIUM 9.9 02/10/2022 0954   PROT 7.3 02/10/2022 0954   ALBUMIN 4.4 02/10/2022 0954   AST 34 02/10/2022 0954   ALT 21 02/10/2022 0954   ALKPHOS 71 02/10/2022 0954   BILITOT 0.3 02/10/2022 0954   GFRNONAA 42 (L) 05/02/2020 1017   GFRAA 49 (L) 05/02/2020 1017     Diabetic Labs (most recent): Lab Results  Component Value Date   HGBA1C 7.9 (H) 02/10/2022   HGBA1C 7.7 (A) 09/22/2021   HGBA1C 8.9 (H) 04/27/2021     Lipid Panel ( most recent) Lipid Panel     Component Value Date/Time   CHOL 154 02/10/2022 0954   TRIG 131 02/10/2022 0954   HDL 47 02/10/2022 0954   CHOLHDL 3.3 02/10/2022 0954   LDLCALC 84 02/10/2022 0954   LABVLDL 23 02/10/2022 0954      Lab Results  Component Value Date   TSH 3.170 02/10/2022   TSH 1.990 09/30/2021   TSH 6.140 (H) 04/27/2021   TSH 1.850 12/25/2019   TSH 4.690 (H) 12/25/2018   TSH 4.060 05/20/2017   TSH 3.510 01/18/2017   TSH 3.500 02/16/2016   TSH 3.390 08/14/2015   FREET4 1.35 09/30/2021   FREET4 1.06 12/25/2019           Assessment & Plan:  1) Type 2 diabetes mellitus with stage 3b chronic kidney  disease, with long-term current use of insulin ([place])  She presents today with her meter and logs showing improved stable glycemic profile with no hypoglycemia.  She was not due for another A1c today.  She reports she feels much better, has adopted a healthier diet and even has the energy to work in her garden now.  Analysis of her meter shows 7-day average of 134, 14-day average of 132, 30-day average of 127.  - Melayah Skorupski Wanzer has currently uncontrolled symptomatic type 2 DM since 76 years of age, with most recent A1c of 7.9 %.   -Recent labs reviewed.  - I had a long discussion with her about the progressive nature of diabetes and the pathology behind its complications. -her diabetes is complicated by CAD with MI and CKD stage 3 and she remains at a high risk for more acute and chronic complications which include CAD, CVA, CKD, retinopathy, and neuropathy. These are all discussed in detail with her.  The following Lifestyle Medicine recommendations according to [facility]) were discussed and offered to patient and she agrees to start the journey:  A. Whole Foods, Plant-based plate comprising of fruits and vegetables, plant-based proteins, whole-grain carbohydrates was discussed in detail with the patient.   A list for source of those nutrients were also provided to the patient.  Patient will use only water or unsweetened tea for hydration. B.  The need to stay away from risky substances including alcohol, smoking; obtaining 7 to 9 hours of restorative sleep, at least 150 minutes of moderate intensity exercise weekly, the importance of healthy social connections,  and stress reduction techniques were discussed. C.  A full color page of  Calorie density of various food groups per pound showing examples of each food groups was provided to the patient.  - Nutritional counseling repeated at each appointment due to patients tendency to fall back in to old  habits.  - The patient admits there is a room for improvement in their diet and drink choices. -  Suggestion is made for the patient to avoid simple carbohydrates from their diet including Cakes, Sweet Desserts / Pastries, Ice Cream, Soda (diet and regular), Sweet Tea, Candies, Chips, Cookies, Sweet Pastries, Store Bought Juices, Alcohol in Excess of 1-2 drinks a day, Artificial Sweeteners, Coffee Creamer, and "Sugar-free" Products. This will help patient to have stable blood glucose profile and potentially avoid unintended weight gain.   - I encouraged the patient to switch to unprocessed or minimally processed complex starch and increased protein intake (animal or plant source), fruits, and vegetables.   - Patient is advised to stick to a routine mealtimes to eat 3 meals a day and avoid unnecessary snacks (to snack only to correct hypoglycemia).  - I have approached her with the following individualized plan to manage her diabetes and patient agrees:   - She is advised to lower her Levemir further to 16 units SQ nightly.  She can continue her Metformin 1000 mg ER daily with breakfast for now.  Will recheck kidney function prior to next visit and adjust dose if needed.  -she is encouraged to continue monitoring blood glucose twice daily, before breakfast and before bed, and to call the clinic if she has readings less than 70 or above 300 for 3 tests in a row.   - she is warned not to take insulin without proper monitoring per orders. - Adjustment parameters are  given to her for hypo and hyperglycemia in writing.  - she will be considered for incretin therapy as appropriate next visit.  - Specific targets for  A1c; LDL, HDL, and Triglycerides were discussed with the patient.  2) Blood Pressure /Hypertension:  her blood pressure is controlled to target.   she is advised to continue her current medications including Norvasc 2.5 mg p.o. daily with breakfast, HCTZ 25 mg po daily, and Enalapril 5  mg po daily.  3) Lipids/Hyperlipidemia:    Review of her recent lipid panel from 02/10/22 showed controlled LDL at 84 .  she is advised to continue Zetia 10 mg daily at bedtime.  Side effects and precautions discussed with her.  4)  Weight/Diet:  her Body mass index is 26.79 kg/m.  -  she is NOT a candidate for weight loss.  Exercise, and detailed carbohydrates information provided  -  detailed on discharge instructions.  5) Hypothyroidism- RAI induced She was diagnosed with hyperthyroidism over 15 years ago which subsequently led to her RAI ablation and now hypothyroidism.  She is currently on Levothyroxine 50 mcg po daily before breakfast.  She does take it properly most days, but does note she takes her MVI about an hour after taking her Levothyroxine which may be impacting her absorption.  We discussed proper administration today and will repeat thyroid function tests prior to next visit.   - The correct intake of thyroid hormone (Levothyroxine, Synthroid), is on empty stomach first thing in the morning, with water, separated by at least 30 minutes from breakfast and other medications,  and separated by more than 4 hours from calcium, iron, multivitamins, acid reflux medications (PPIs).  - This medication is a life-long medication and will be needed to correct thyroid hormone imbalances for the rest of your life.  The dose may change from time to time, based on thyroid blood work.  - It is extremely important to be consistent taking this medication, near the same time each morning.  -AVOID TAKING PRODUCTS CONTAINING BIOTIN (commonly found in Hair, Skin, Nails vitamins) AS IT INTERFERES WITH THE VALIDITY OF THYROID FUNCTION BLOOD TESTS.  6) Chronic Care/Health Maintenance: -she is on ACEI/ARB and not on Statin medications and is encouraged to initiate and continue to follow up with Ophthalmology, Dentist, Podiatrist at least yearly or according to recommendations, and advised to stay away from  smoking. I have recommended yearly flu vaccine and pneumonia vaccine at least every 5 years; moderate intensity exercise for up to 150 minutes weekly; and sleep for at least 7 hours a day.  - she is advised to maintain close follow up with Renee Rival, FNP for primary care needs, as well as her other providers for optimal and coordinated care.     I spent 30 minutes in the care of the patient today including review of labs from [facility], Lipids, Thyroid Function, Hematology (current and previous including abstractions from other facilities); face-to-face time discussing  her blood glucose readings/logs, discussing hypoglycemia and hyperglycemia episodes and symptoms, medications doses, her options of short and long term treatment based on the latest standards of care / guidelines;  discussion about incorporating lifestyle medicine;  and documenting the encounter.    Please refer to Patient Instructions for Blood Glucose Monitoring and Insulin/Medications Dosing Guide"  in media tab for additional information. Please  also refer to " Patient Self Inventory" in the Media  tab for reviewed elements of pertinent patient history.  Zionah Criswell Brodbeck participated in the  discussions, expressed understanding, and voiced agreement with the above plans.  All questions were answered to her satisfaction. she is encouraged to contact clinic should she have any questions or concerns prior to her return visit.     Follow up plan: - Return in about 3 months (around 06/03/2022) for Diabetes F/U with A1c in office, Previsit labs, Thyroid follow up, Bring meter and logs.    Rayetta Pigg, [facility] [address] [place], [place] [address] Phone: [phone] Fax: [phone]  03/03/2022, 3:25 PM

## 2022-03-03 NOTE — Patient Instructions (Signed)

## 2022-03-23 ENCOUNTER — Ambulatory Visit: Payer: Medicare Other | Admitting: Internal Medicine

## 2022-04-08 ENCOUNTER — Ambulatory Visit: Payer: Medicare Other | Admitting: Nurse Practitioner

## 2022-04-29 ENCOUNTER — Ambulatory Visit: Payer: Medicare Other | Admitting: Nurse Practitioner

## 2022-04-30 ENCOUNTER — Other Ambulatory Visit: Payer: Self-pay | Admitting: Nurse Practitioner

## 2022-04-30 DIAGNOSIS — E113299 Type 2 diabetes mellitus with mild nonproliferative diabetic retinopathy without macular edema, unspecified eye: Secondary | ICD-10-CM

## 2022-05-06 ENCOUNTER — Ambulatory Visit (INDEPENDENT_AMBULATORY_CARE_PROVIDER_SITE_OTHER): Payer: Medicare Other | Admitting: Nurse Practitioner

## 2022-05-06 ENCOUNTER — Encounter: Payer: Self-pay | Admitting: Nurse Practitioner

## 2022-05-06 VITALS — BP 154/68 | HR 83 | Ht 66.0 in | Wt 160.0 lb

## 2022-05-06 DIAGNOSIS — E118 Type 2 diabetes mellitus with unspecified complications: Secondary | ICD-10-CM | POA: Diagnosis not present

## 2022-05-06 DIAGNOSIS — E1169 Type 2 diabetes mellitus with other specified complication: Secondary | ICD-10-CM | POA: Diagnosis not present

## 2022-05-06 DIAGNOSIS — I10 Essential (primary) hypertension: Secondary | ICD-10-CM | POA: Diagnosis not present

## 2022-05-06 DIAGNOSIS — N179 Acute kidney failure, unspecified: Secondary | ICD-10-CM

## 2022-05-06 DIAGNOSIS — N1832 Chronic kidney disease, stage 3b: Secondary | ICD-10-CM | POA: Insufficient documentation

## 2022-05-06 DIAGNOSIS — F324 Major depressive disorder, single episode, in partial remission: Secondary | ICD-10-CM

## 2022-05-06 DIAGNOSIS — E785 Hyperlipidemia, unspecified: Secondary | ICD-10-CM

## 2022-05-06 DIAGNOSIS — F325 Major depressive disorder, single episode, in full remission: Secondary | ICD-10-CM | POA: Diagnosis not present

## 2022-05-06 HISTORY — DX: Acute kidney failure, unspecified: N17.9

## 2022-05-06 NOTE — Assessment & Plan Note (Deleted)
PHQ 9 score 0 Currently on Prozac 40 mg daily Denies SI, HI

## 2022-05-06 NOTE — Assessment & Plan Note (Signed)
Lab Results  Component Value Date   HGBA1C 7.9 (H) 02/10/2022   Currently on metformin 1000 mg  daily, Levemir 20 units daily She reports feeling much better since Levemir was decreased to 20 units. Diabetic foot exam completed today Avoid sugar sweets soda Patient encouraged to maintain close follow-up with endocrinologist she verbalized understanding

## 2022-05-06 NOTE — Assessment & Plan Note (Signed)
Lab Results  Component Value Date   NA 140 02/10/2022   K 4.0 02/10/2022   CO2 26 02/10/2022   GLUCOSE 102 (H) 02/10/2022   BUN 25 02/10/2022   CREATININE 1.42 (H) 02/10/2022   CALCIUM 9.9 02/10/2022   EGFR 38 (L) 02/10/2022   GFRNONAA 42 (L) 05/02/2020  Checking BMP today Patient told to avoid NSAIDs and other nephrotoxic agents To drink at least 64 ounces of water daily

## 2022-05-06 NOTE — Assessment & Plan Note (Addendum)
PHQ 9 score 0 Currently on Prozac 40 mg daily Denies SI, HI Will discuss the need to come off medication at her next visit if he condition remains well controlled

## 2022-05-06 NOTE — Assessment & Plan Note (Signed)
Currently on Zetia 10 mg daily Check lipid panel today Avoid fatty fried foods I discussed need for Repatha injection if her LDL is not less than 55 she verbalized understanding

## 2022-05-06 NOTE — Assessment & Plan Note (Signed)
BP Readings from Last 3 Encounters:  05/06/22 (!) 154/68  03/03/22 (!) 126/58  02/17/22 105/65  Currently on enalapril 5 mg twice daily, amlodipine 2.5 mg daily, hydrochlorothiazide 25 mg daily. Patient stated that she has not taking her medications today but she has been taking them daily Continue current medications no changes made to her medications today DASH diet advised engage in regular walking exercises as tolerated Check BMP today due to her declining renal function

## 2022-05-06 NOTE — Progress Notes (Signed)
 Adler Rae Ghazi     MRN: [id number]      DOB: 01/23/1946   HPI Ms. Brunette with past medical history of CAD, essential hypertension, type 2 diabetes, hyperlipidemia is here for follow up for hyperlipidemia   Hyperlipidemia.  Currently on Zetia 10 mg daily she has not been taking Crestor due to complaints of leg weakness.  Patient stated that she has also cut down on fatty foods.   Hypertension.  Currently on amlodipine 2.5 mg daily, enalapril 5 mg twice daily, hydrochlorothiazide 25 mg daily.  Patient reported that she has been taking her medications daily as ordered but she has not taking them today.  She denies headache, dizziness, edema.   Patient states that she feels states that she feels a lot better since she started taking Levemir 20 units once a day instead of twice daily. She states that she has been busy taking care of her husband who recently had open heart surgery     ROS Denies recent fever or chills. Denies sinus pressure, nasal congestion, ear pain or sore throat. Denies chest congestion, productive cough or wheezing. Denies chest pains, palpitations and leg swelling Denies abdominal pain, nausea, vomiting,diarrhea or constipation.   Denies dysuria, frequency, hesitancy or incontinence. Denies headaches, seizures, numbness, or tingling. Denies depression, anxiety or insomnia. Denies skin break down or rash.   PE  BP (!) 154/68 (BP Location: Left Arm, Cuff Size: Normal)   Pulse 83   Ht 5' 6" (1.676 m)   Wt 160 lb (72.6 kg)   SpO2 99%   BMI 25.82 kg/m   Patient alert and oriented and in no cardiopulmonary distress.   Chest: Clear to auscultation bilaterally.  CVS: S1, S2 no murmurs, no S3.Regular rate.  ABD: Soft non tender.   Ext: No edema  MS: Adequate ROM spine, shoulders, hips and knees.  Psych: Good eye contact, normal affect. Memory intact not anxious or depressed appearing.  CNS: CN 2-12 intact, power,  normal throughout.no focal  deficits noted.   Assessment & Plan  Essential (primary) hypertension BP Readings from Last 3 Encounters:  05/06/22 (!) 154/68  03/03/22 (!) 126/58  02/17/22 105/65  Currently on enalapril 5 mg twice daily, amlodipine 2.5 mg daily, hydrochlorothiazide 25 mg daily. Patient stated that she has not taking her medications today but she has been taking them daily Continue current medications no changes made to her medications today DASH diet advised engage in regular walking exercises as tolerated Check BMP today due to her declining renal function  Hyperlipidemia associated with type 2 diabetes mellitus (H[facility]) Currently on Zetia 10 mg daily Check lipid panel today Avoid fatty fried foods I discussed need for Repatha injection if her LDL is not less than 55 she verbalized understanding  AKI (acute kidney injury) (HCC) Lab Results  Component Value Date   NA 140 02/10/2022   K 4.0 02/10/2022   CO2 26 02/10/2022   GLUCOSE 102 (H) 02/10/2022   BUN 25 02/10/2022   CREATININE 1.42 (H) 02/10/2022   CALCIUM 9.9 02/10/2022   EGFR 38 (L) 02/10/2022   GFRNONAA 42 (L) 05/02/2020  Checking BMP today Patient told to avoid NSAIDs and other nephrotoxic agents To drink at least 64 ounces of water daily  Major depression in partial remission (HCC) PHQ 9 score 0 Currently on Prozac 40 mg daily Denies SI, HI Will discuss the need to come off medication at her next visit if he condition remains well controlled   Type II diabetes   mellitus with complication ([facility]) Lab Results  Component Value Date   HGBA1C 7.9 (H) 02/10/2022   Currently on metformin 1000 mg  daily, Levemir 20 units daily She reports feeling much better since Levemir was decreased to 20 units. Diabetic foot exam completed today Avoid sugar sweets soda Patient encouraged to maintain close follow-up with endocrinologist she verbalized understanding

## 2022-05-06 NOTE — Patient Instructions (Addendum)
Please get your shingles vaccine at the pharmacy     It is important that you exercise regularly at least 30 minutes 5 times a week.  Think about what you will eat, plan ahead. Choose " clean, green, fresh or frozen" over canned, processed or packaged foods which are more sugary, salty and fatty. 70 to 75% of food eaten should be vegetables and fruit. Three meals at set times with snacks allowed between meals, but they must be fruit or vegetables. Aim to eat over a 12 hour period , example 7 am to 7 pm, and STOP after  your last meal of the day. Drink water,generally about 64 ounces per day, no other drink is as healthy. Fruit juice is best enjoyed in a healthy way, by EATING the fruit.  Thanks for choosing [facility], we consider it a privelige to serve you.

## 2022-05-10 DIAGNOSIS — E1169 Type 2 diabetes mellitus with other specified complication: Secondary | ICD-10-CM | POA: Diagnosis not present

## 2022-05-10 DIAGNOSIS — E785 Hyperlipidemia, unspecified: Secondary | ICD-10-CM | POA: Diagnosis not present

## 2022-05-10 DIAGNOSIS — N179 Acute kidney failure, unspecified: Secondary | ICD-10-CM | POA: Diagnosis not present

## 2022-05-10 DIAGNOSIS — I10 Essential (primary) hypertension: Secondary | ICD-10-CM | POA: Diagnosis not present

## 2022-05-11 ENCOUNTER — Other Ambulatory Visit: Payer: Self-pay | Admitting: Nurse Practitioner

## 2022-05-11 DIAGNOSIS — E1169 Type 2 diabetes mellitus with other specified complication: Secondary | ICD-10-CM

## 2022-05-11 LAB — BASIC METABOLIC PANEL
BUN/Creatinine Ratio: 19 (ref 12–28)
BUN: 19 mg/dL (ref 8–27)
CO2: 26 mmol/L (ref 20–29)
Calcium: 9.4 mg/dL (ref 8.7–10.3)
Chloride: 103 mmol/L (ref 96–106)
Creatinine, Ser: 0.98 mg/dL (ref 0.57–1.00)
Glucose: 84 mg/dL (ref 70–99)
Potassium: 4.6 mmol/L (ref 3.5–5.2)
Sodium: 146 mmol/L — ABNORMAL HIGH (ref 134–144)
eGFR: 59 mL/min/{1.73_m2} — ABNORMAL LOW (ref >59)

## 2022-05-11 LAB — LIPID PANEL
Chol/HDL Ratio: 5.1 ratio — ABNORMAL HIGH (ref 0.0–4.4)
Cholesterol, Total: 260 mg/dL — ABNORMAL HIGH (ref 100–199)
HDL: 51 mg/dL (ref >39)
LDL Chol Calc (NIH): 186 mg/dL — ABNORMAL HIGH (ref 0–99)
Triglycerides: 125 mg/dL (ref 0–149)
VLDL Cholesterol Cal: 23 mg/dL (ref 5–40)

## 2022-05-11 NOTE — Progress Notes (Signed)
Sodium is slightly elevated patient should drink at least 64 ounces of water daily.  LDL is worse I have reffered patient to the advanced hyperlipidemia clinic for Repatha injection

## 2022-05-19 ENCOUNTER — Other Ambulatory Visit: Payer: Self-pay | Admitting: Nurse Practitioner

## 2022-05-19 DIAGNOSIS — E113299 Type 2 diabetes mellitus with mild nonproliferative diabetic retinopathy without macular edema, unspecified eye: Secondary | ICD-10-CM

## 2022-05-20 ENCOUNTER — Other Ambulatory Visit: Payer: Self-pay

## 2022-05-20 MED ORDER — BD PEN NEEDLE NANO U/F 32G X 4 MM MISC: 6 refills | Status: DC

## 2022-06-03 ENCOUNTER — Ambulatory Visit: Payer: Medicare Other | Admitting: Nurse Practitioner

## 2022-06-03 DIAGNOSIS — E1122 Type 2 diabetes mellitus with diabetic chronic kidney disease: Secondary | ICD-10-CM

## 2022-06-03 DIAGNOSIS — E890 Postprocedural hypothyroidism: Secondary | ICD-10-CM

## 2022-06-03 DIAGNOSIS — I10 Essential (primary) hypertension: Secondary | ICD-10-CM

## 2022-07-02 ENCOUNTER — Encounter: Payer: Self-pay | Admitting: Family Medicine

## 2022-07-02 ENCOUNTER — Ambulatory Visit (INDEPENDENT_AMBULATORY_CARE_PROVIDER_SITE_OTHER): Payer: Medicare Other | Admitting: Family Medicine

## 2022-07-02 VITALS — BP 122/82 | HR 58 | Ht 66.0 in | Wt 158.1 lb

## 2022-07-02 DIAGNOSIS — J329 Chronic sinusitis, unspecified: Secondary | ICD-10-CM | POA: Insufficient documentation

## 2022-07-02 DIAGNOSIS — R519 Headache, unspecified: Secondary | ICD-10-CM | POA: Diagnosis not present

## 2022-07-02 DIAGNOSIS — J324 Chronic pansinusitis: Secondary | ICD-10-CM | POA: Diagnosis not present

## 2022-07-02 DIAGNOSIS — J321 Chronic frontal sinusitis: Secondary | ICD-10-CM | POA: Diagnosis not present

## 2022-07-02 HISTORY — DX: Chronic sinusitis, unspecified: J32.9

## 2022-07-02 MED ORDER — AZELASTINE HCL 0.1 % NA SOLN: 1.0000 | Freq: Two times a day (BID) | NASAL | 1 refills | Status: DC

## 2022-07-02 MED ORDER — CETIRIZINE HCL 5 MG PO TABS: 5.0000 mg | ORAL_TABLET | Freq: Every day | ORAL | 0 refills | Status: DC

## 2022-07-02 MED ORDER — AZELASTINE HCL 0.1 % NA SOLN: 1.0000 | Freq: Two times a day (BID) | NASAL | 12 refills | Status: DC
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
## 2022-07-02 NOTE — Assessment & Plan Note (Signed)
C/o of headaches, nasal congestion, and thick yellow mucus She has a history of chronic sinusitis and noted that antibiotics have not helped her symptoms She reported using flonase nasal spray with minimum relief of symptoms She reports that she has never seen ENT A referral placed to ENT The patient will be treated with cetirizine 5 mg daily and Azelastine nasal spray

## 2022-07-02 NOTE — Patient Instructions (Addendum)
I appreciate the opportunity to provide care to you today!    Follow up:  09/07/22  Please pick up your medications at the pharmacy  Referrals today- ENT Dr.Su Raynelle Bring   Please continue to a heart-healthy diet and increase your physical activities. Try to exercise for 8mns at least three times a week.      It was a pleasure to see you and I look forward to continuing to work together on your health and well-being. Please do not hesitate to call the office if you need care or have questions about your care.   Have a wonderful day and week. With Gratitude, GAlvira MondayMSN, FNP-BC
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
## 2022-07-06 ENCOUNTER — Ambulatory Visit: Payer: Medicare Other | Attending: Internal Medicine | Admitting: Internal Medicine

## 2022-07-06 ENCOUNTER — Encounter: Payer: Self-pay | Admitting: Internal Medicine

## 2022-07-06 VITALS — BP 129/72 | HR 74 | Ht 65.0 in | Wt 157.6 lb

## 2022-07-06 DIAGNOSIS — E1169 Type 2 diabetes mellitus with other specified complication: Secondary | ICD-10-CM | POA: Diagnosis not present

## 2022-07-06 DIAGNOSIS — E785 Hyperlipidemia, unspecified: Secondary | ICD-10-CM | POA: Diagnosis not present

## 2022-07-06 NOTE — Progress Notes (Addendum)
Cardiology Office Note:    Date:  07/06/2022   ID:  Angela Rose, DOB 04-13-1946, MRN [id number]  PCP:  Renee Rival, FNP   [facility] Providers Cardiologist:  Janina Mayo, MD     Referring MD: Renee Rival, FNP   No chief complaint on file.  Establish Care  History of Present Illness:    Angela Rose is a 76 y.o. female with a hx of MI in [place], HTN, HLD, PAD, former smoker, carotid atherosclerosis, no hx of stroke, previously followed at [facility] w/ Dr. Nehemiah Massed when she lived in [place] she has since moved to [place]  referral to establish care here  In March 12/25/2018 she had EKG per report during an annual visit and it showed ?atrial fibrillation with a HR of 74. She has CHADS2VASC of 5. She was reluctant to start anticoagulation. She was started on ASA. She saw cardiology at [facility] 01/16/2019 who did an event monitor and it showed PACs, PVCs, asymptomatic bradycardia, sinus tachycardia but no atrial fibrillation. No captured data c/w atrial fibrillation in the cardiology notes.   She was noted to have hx of 3v CABG in 1996, she notes mild MI 1993 with stents.. She has hx of PVD and carotid atherosclerosis that is stable. She was started on ASA 81 mg daily and statin.She had a stress test that was normal 01/10/2019. She denies palpitations. No chest pain. No pnd , orthopnea, or LE edema. She notes she is fatigued. She's been under a lot of stress..   For her hypertension she is managed on norvasc 2.5 mg daily, HCTz 25 mg daily and enalapril 5 mg BID. Her blood pressure is well controlled per report, it's high today. Its typically 115-125 mg.   For HLD she's on crestor 5 mg daily he has T2DM  on metformin 500 mg BID Shes on jardiance.   She's here with husband from [place], now they are in [place].   Labs: 04/27/2021 LDL 108 TC 192 TSH 6.14  A1c 8.9%    Interim Hx 12/28/2021: Angela Rose presents for follow up. She notes  some allergy symptoms. No syncope. SOB, or chest pressure. She feels overall fatigued and worried it may be due to thyroid v aging  Interim Hx 07/06/2022 Lipids were not at goal. Her crestor increased to 40 and she started zetia with her PCP. She stopped crestor inadvertently when she started zetia.  No CP/SOB. She denies orthopnea/PND or LE edema.    Cardiology Studies  TTE 07/23/2020 at [facility] FUNCTION WITH MILD LVH NORMAL RIGHT VENTRICULAR SYSTOLIC FUNCTION MILD VALVULAR REGURGITATION (See above) NO VALVULAR STENOSIS MILD MR, TR, PR EF 50%  Past Medical History:  Diagnosis Date   Anxiety    Cancer ([place])    melanoma face   Depression    Diabetes mellitus without complication ([place])    Fracture of tibial plateau 12/31/2015   Hyperlipidemia    Hypertension     Past Surgical History:  Procedure Laterality Date   ABDOMINAL HYSTERECTOMY     APPENDECTOMY     BASAL CELL CARCINOMA EXCISION     BREAST BIOPSY Right    needle bx-neg   CATARACT EXTRACTION, BILATERAL Bilateral 11/23/2018   CORONARY ARTERY BYPASS GRAFT  1996   x 3   MELANOMA EXCISION     PARTIAL HYSTERECTOMY  1976   ovaries remain    Current Medications: No outpatient medications have been marked as taking  for the 07/06/22 encounter (Appointment) with Janina Mayo, MD.     Allergies:   Propoxyphene, Lidocaine-epinephrine, Other, Tape, Xylocaine  [lidocaine hcl], and Jardiance [empagliflozin]   Social History   Socioeconomic History   Marital status: Significant Other    Spouse name: Not on file   Number of children: 3   Years of education: Not on file   Highest education level: Some college, no degree  Occupational History   Occupation: retired  Tobacco Use   Smoking status: Former    Types: Cigarettes    Quit date: 06/11/1982    Years since quitting: 40.0   Smokeless tobacco: Never   Tobacco comments:    smoking cessation materials not required  Vaping Use    Vaping Use: Never used  Substance and Sexual Activity   Alcohol use: Yes    Alcohol/week: 0.0 standard drinks of alcohol    Comment: rare 1 wine glass of wine   Drug use: No   Sexual activity: Not on file  Other Topics Concern   Not on file  Social History Narrative   Not on file   Social Determinants of Health   Financial Resource Strain: Low Risk  (12/14/2021)   Overall Financial Resource Strain (CARDIA)    Difficulty of Paying Living Expenses: Not hard at all  Food Insecurity: No Food Insecurity (12/14/2021)   Hunger Vital Sign    Worried About Running Out of Food in the Last Year: Never true    Ran Out of Food in the Last Year: Never true  Transportation Needs: No Transportation Needs (12/14/2021)   PRAPARE - Hydrologist (Medical): No    Lack of Transportation (Non-Medical): No  Physical Activity: Inactive (12/14/2021)   Exercise Vital Sign    Days of Exercise per Week: 0 days    Minutes of Exercise per Session: 0 min  Stress: Stress Concern Present (12/14/2021)   [place]    Feeling of Stress : To some extent  Social Connections: Moderately Isolated (12/14/2021)   Social Connection and Isolation Panel [NHANES]    Frequency of Communication with Friends and Family: More than three times a week    Frequency of Social Gatherings with Friends and Family: Once a week    Attends Religious Services: Never    Marine scientist or Organizations: No    Attends Music therapist: Never    Marital Status: Living with partner     Family History: The patient's family history includes Basal cell carcinoma in her mother; CAD in her father; Heart disease in her father; Skin cancer in her mother; Squamous cell carcinoma in her mother. There is no history of Breast cancer.  ROS:   Please see the history of present illness.     All other systems reviewed and are  negative.  EKGs/Labs/Other Studies Reviewed:    The following studies were reviewed today:   EKG:  EKG is  ordered today.  The ekg ordered today demonstrates   Sinus with PACs,1st degree AV block, PVC  Recent Labs: 02/10/2022: ALT 21; Hemoglobin 11.4; Platelets 318; TSH 3.170 05/10/2022: BUN 19; Creatinine, Ser 0.98; Potassium 4.6; Sodium 146   Recent Lipid Panel    Component Value Date/Time   CHOL 260 (H) 05/10/2022 1124   TRIG 125 05/10/2022 1124   HDL 51 05/10/2022 1124   CHOLHDL 5.1 (H) 05/10/2022 1124   LDLCALC 186 (H) 05/10/2022 1124  Risk Assessment/Calculations:         Physical Exam:    VS:   Vitals:   07/06/22 1420  BP: 129/72  Pulse: 74  SpO2: 96%     Wt Readings from Last 3 Encounters:  07/02/22 158 lb 1.9 oz (71.7 kg)  05/06/22 160 lb (72.6 kg)  03/03/22 166 lb (75.3 kg)     GEN:  Well nourished, well developed in no acute distress HEENT: Normal NECK: No JVD; No carotid bruits LYMPHATICS: No lymphadenopathy CARDIAC: RRR, no murmurs, rubs, gallops RESPIRATORY:  Clear to auscultation without rales, wheezing or rhonchi  ABDOMEN: Soft, non-tender, non-distended MUSCULOSKELETAL:  No edema; No deformity  SKIN: Warm and dry NEUROLOGIC:  Alert and oriented x 3 PSYCHIATRIC:  Normal affect   ASSESSMENT:    #Ischemic Heart Dx: Hx of CABG per above. Low Normal EF. No hx of CHF. Stable. Continue SGLT2, asa 81 mg daily, and crestor 5 mg daily. Had Wenckebach on her zio, can hold off on BB - LDL goal < 70, not at goal   - continue crestor 40 mg daily and zetia 10  -CABG >3 years, ok to continue off bb - plan to continue crestor/zetia together for now and recheck her lipids. I can recheck. No indication for repatha at this time. Will reassess her LDL . No need for lipid clinic for now, will cancel  #DM2: A1c goal <7. Had yeast infections with Jardiance. On insulin/metformin  #HTN: well controlled. continue Hctz 25 mg daily and norvasc 2.5 mg daily.  Continue enalapril 5 mg daily.   #Elevated TSH: Noted in December. FU free T4 was normal.   PLAN:    In order of problems listed above:  Follow up 12 months Lipids fasting in 3 months      Medication Adjustments/Labs and Tests Ordered: Current medicines are reviewed at length with the patient today.  Concerns regarding medicines are outlined above.   Signed, Janina Mayo, MD  07/06/2022 2:00 PM    [place]

## 2022-07-06 NOTE — Patient Instructions (Signed)
Medication Instructions:  Your physician recommends that you continue on your current medications as directed. Please refer to the Current Medication list given to you today.  *If you need a refill on your cardiac medications before your next appointment, please call your pharmacy*   Lab Work: Please return for FASTING labs in 3 months (Lipid)  Our in office lab hours are Monday-Friday 8:00-4:00, closed for lunch 12:45-1:45 pm.  No appointment needed.  [facility] locations:   [address] (Dr. Nelly Laurence office) - [address] [place] ([facility]) - [address] - [address] [place] - [address] ([facility])  Follow-Up: At [facility], you and your health needs are our priority.  As part of our continuing mission to provide you with exceptional heart care, we have created designated Provider Care Teams.  These Care Teams include your primary Cardiologist (physician) and Advanced Practice Providers (APPs -  Physician Assistants and Nurse Practitioners) who all work together to provide you with the care you need, when you need it.  We recommend signing up for the patient portal called "MyChart".  Sign up information is provided on this After Visit Summary.  MyChart is used to connect with patients for Virtual Visits (Telemedicine).  Patients are able to view lab/test results, encounter notes, upcoming appointments, etc.  Non-urgent messages can be sent to your provider as well.   To learn more about what you can do with MyChart, go to [email or web address].    Your next appointment:   12 month(s)  The format for your next appointment:   In Person  Provider:   Janina Mayo, MD

## 2022-07-13 ENCOUNTER — Ambulatory Visit (HOSPITAL_COMMUNITY)
Admission: RE | Admit: 2022-07-13 | Discharge: 2022-07-13 | Disposition: A | Discharge status: Home / Self Care | Payer: Medicare Other | Source: Ambulatory Visit | Attending: Family Medicine | Admitting: Family Medicine

## 2022-07-13 DIAGNOSIS — R519 Headache, unspecified: Secondary | ICD-10-CM | POA: Insufficient documentation

## 2022-07-14 NOTE — Progress Notes (Signed)
Please inform the patient that her CT scan of the head showed no acute intracranial abnormalities.  Her CT scan was unremarkable.

## 2022-08-03 DIAGNOSIS — E890 Postprocedural hypothyroidism: Secondary | ICD-10-CM | POA: Diagnosis not present

## 2022-08-03 DIAGNOSIS — E113299 Type 2 diabetes mellitus with mild nonproliferative diabetic retinopathy without macular edema, unspecified eye: Secondary | ICD-10-CM | POA: Diagnosis not present

## 2022-08-04 ENCOUNTER — Other Ambulatory Visit: Payer: Self-pay | Admitting: Nurse Practitioner

## 2022-08-04 DIAGNOSIS — N1832 Chronic kidney disease, stage 3b: Secondary | ICD-10-CM

## 2022-08-04 LAB — COMPREHENSIVE METABOLIC PANEL
ALT: 158 IU/L — ABNORMAL HIGH (ref 0–32)
AST: 224 IU/L — ABNORMAL HIGH (ref 0–40)
Albumin/Globulin Ratio: 1.7 (ref 1.2–2.2)
Albumin: 4.3 g/dL (ref 3.8–4.8)
Alkaline Phosphatase: 100 IU/L (ref 44–121)
BUN/Creatinine Ratio: 21 (ref 12–28)
BUN: 35 mg/dL — ABNORMAL HIGH (ref 8–27)
Bilirubin Total: 0.3 mg/dL (ref 0.0–1.2)
CO2: 22 mmol/L (ref 20–29)
Calcium: 9.5 mg/dL (ref 8.7–10.3)
Chloride: 98 mmol/L (ref 96–106)
Creatinine, Ser: 1.64 mg/dL — ABNORMAL HIGH (ref 0.57–1.00)
Globulin, Total: 2.5 g/dL (ref 1.5–4.5)
Glucose: 127 mg/dL — ABNORMAL HIGH (ref 70–99)
Potassium: 4.6 mmol/L (ref 3.5–5.2)
Sodium: 141 mmol/L (ref 134–144)
Total Protein: 6.8 g/dL (ref 6.0–8.5)
eGFR: 32 mL/min/{1.73_m2} — ABNORMAL LOW (ref >59)

## 2022-08-04 LAB — TSH: TSH: 1.54 u[IU]/mL (ref 0.450–4.500)

## 2022-08-04 LAB — T4, FREE: Free T4: 1.35 ng/dL (ref 0.82–1.77)

## 2022-08-04 NOTE — Progress Notes (Signed)
Please let patient know that her liver function is increased.  This could be due to the recent increase in dose of her Crestor.  She needs to avoid hepatotoxic medications like steroids, tylenol, ibuprofen, etc.  I also want her to stop her Crestor for now too until we recheck her labs.  Will need to repeat test in 2 weeks to monitor (I have already placed the order).  Her kidney function is also worse which can be contributed by dehydration and medications as well.  She will need to lower her Metformin to 500 mg po once daily with breakfast for now.

## 2022-08-09 ENCOUNTER — Telehealth: Payer: Self-pay | Admitting: Family Medicine

## 2022-08-09 ENCOUNTER — Telehealth: Payer: Self-pay

## 2022-08-09 NOTE — Telephone Encounter (Signed)
Pt notified and voices understanding. My Chart msg also sent. Lab order mailed to pt

## 2022-08-09 NOTE — Telephone Encounter (Signed)
-----   Message from Brita Romp, NP sent at 08/04/2022  7:32 AM EDT ----- Please let patient know that her liver function is increased.  This could be due to the recent increase in dose of her Crestor.  She needs to avoid hepatotoxic medications like steroids, tylenol, ibuprofen, etc.  I also want her to stop her Crestor for now too until we recheck her labs.  Will need to repeat test in 2 weeks to monitor (I have already placed the order).  Her kidney function is also worse which can be contributed by dehydration and medications as well.  She will need to lower her Metformin to 500 mg po once daily with breakfast for now.

## 2022-08-09 NOTE — Telephone Encounter (Signed)
[employer] called on behalf of patient stating she is needing refills on amLODipine (NORVASC) 2.5 MG tablet and enalapril (VASOTEC) 5 MG tablet  . Can you please refill?  [facility]--ph# [phone]

## 2022-08-10 ENCOUNTER — Other Ambulatory Visit: Payer: Self-pay

## 2022-08-10 DIAGNOSIS — I10 Essential (primary) hypertension: Secondary | ICD-10-CM

## 2022-08-10 MED ORDER — AMLODIPINE BESYLATE 2.5 MG PO TABS: 2.5000 mg | ORAL_TABLET | Freq: Every day | ORAL | 3 refills | Status: DC

## 2022-08-10 MED ORDER — ENALAPRIL MALEATE 5 MG PO TABS: 5.0000 mg | ORAL_TABLET | Freq: Two times a day (BID) | ORAL | 3 refills | Status: DC

## 2022-08-10 NOTE — Telephone Encounter (Signed)
Refills sent

## 2022-08-23 DIAGNOSIS — Z794 Long term (current) use of insulin: Secondary | ICD-10-CM | POA: Diagnosis not present

## 2022-08-23 DIAGNOSIS — N1832 Chronic kidney disease, stage 3b: Secondary | ICD-10-CM | POA: Diagnosis not present

## 2022-08-23 DIAGNOSIS — E1122 Type 2 diabetes mellitus with diabetic chronic kidney disease: Secondary | ICD-10-CM | POA: Diagnosis not present

## 2022-08-24 LAB — COMPREHENSIVE METABOLIC PANEL
ALT: 15 IU/L (ref 0–32)
AST: 18 IU/L (ref 0–40)
Albumin/Globulin Ratio: 1.7 (ref 1.2–2.2)
Albumin: 4.1 g/dL (ref 3.8–4.8)
Alkaline Phosphatase: 74 IU/L (ref 44–121)
BUN/Creatinine Ratio: 24 (ref 12–28)
BUN: 27 mg/dL (ref 8–27)
Bilirubin Total: 0.3 mg/dL (ref 0.0–1.2)
CO2: 27 mmol/L (ref 20–29)
Calcium: 9.4 mg/dL (ref 8.7–10.3)
Chloride: 98 mmol/L (ref 96–106)
Creatinine, Ser: 1.11 mg/dL — ABNORMAL HIGH (ref 0.57–1.00)
Globulin, Total: 2.4 g/dL (ref 1.5–4.5)
Glucose: 219 mg/dL — ABNORMAL HIGH (ref 70–99)
Potassium: 4.3 mmol/L (ref 3.5–5.2)
Sodium: 139 mmol/L (ref 134–144)
Total Protein: 6.5 g/dL (ref 6.0–8.5)
eGFR: 51 mL/min/{1.73_m2} — ABNORMAL LOW (ref >59)

## 2022-09-07 ENCOUNTER — Ambulatory Visit (INDEPENDENT_AMBULATORY_CARE_PROVIDER_SITE_OTHER): Payer: Medicare Other | Admitting: Family Medicine

## 2022-09-07 ENCOUNTER — Encounter: Payer: Self-pay | Admitting: Family Medicine

## 2022-09-07 VITALS — BP 122/76 | HR 56 | Ht 65.0 in | Wt 154.1 lb

## 2022-09-07 DIAGNOSIS — E559 Vitamin D deficiency, unspecified: Secondary | ICD-10-CM | POA: Diagnosis not present

## 2022-09-07 DIAGNOSIS — J343 Hypertrophy of nasal turbinates: Secondary | ICD-10-CM | POA: Diagnosis not present

## 2022-09-07 DIAGNOSIS — E890 Postprocedural hypothyroidism: Secondary | ICD-10-CM

## 2022-09-07 DIAGNOSIS — H9313 Tinnitus, bilateral: Secondary | ICD-10-CM | POA: Diagnosis not present

## 2022-09-07 DIAGNOSIS — I10 Essential (primary) hypertension: Secondary | ICD-10-CM | POA: Diagnosis not present

## 2022-09-07 DIAGNOSIS — E785 Hyperlipidemia, unspecified: Secondary | ICD-10-CM | POA: Diagnosis not present

## 2022-09-07 DIAGNOSIS — J320 Chronic maxillary sinusitis: Secondary | ICD-10-CM

## 2022-09-07 DIAGNOSIS — E118 Type 2 diabetes mellitus with unspecified complications: Secondary | ICD-10-CM

## 2022-09-07 DIAGNOSIS — Z23 Encounter for immunization: Secondary | ICD-10-CM

## 2022-09-07 DIAGNOSIS — J342 Deviated nasal septum: Secondary | ICD-10-CM | POA: Diagnosis not present

## 2022-09-07 DIAGNOSIS — E1169 Type 2 diabetes mellitus with other specified complication: Secondary | ICD-10-CM | POA: Diagnosis not present

## 2022-09-07 DIAGNOSIS — H903 Sensorineural hearing loss, bilateral: Secondary | ICD-10-CM | POA: Diagnosis not present

## 2022-09-07 NOTE — Patient Instructions (Addendum)
I appreciate the opportunity to provide care to you today!    Follow up:  3 months  Labs: please stop by the lab during the week to get your blood drawn (CBC, CMP, TSH, Lipid profile, HgA1c, Vit D)  Please continue to follow up with ENT today I recommend warm salt gargles 3-4 times daily for sore throat     Please continue to a heart-healthy diet and increase your physical activities. Try to exercise for 16mns at least three times a week.      It was a pleasure to see you and I look forward to continuing to work together on your health and well-being. Please do not hesitate to call the office if you need care or have questions about your care.   Have a wonderful day and week. With Gratitude, GAlvira MondayMSN, FNP-BC

## 2022-09-07 NOTE — Assessment & Plan Note (Signed)
Controlled Encouraged to continue taking hydrochlorothiazide 25 mg daily, amlodipine 2.5 mg daily and enalapril 5 mg daily BP Readings from Last 3 Encounters:  09/07/22 122/76  07/06/22 129/72  07/02/22 122/82

## 2022-09-07 NOTE — Assessment & Plan Note (Addendum)
Encouraged to follow-up with ENT today Recommended use of humidifier at home Encourage warm salt gargles 3-4 times daily for sore throat Encouraged to continue using azelastine nasal spray until otherwise told by ENT Encouraged to continue taking Zyrtec 5 mg daily

## 2022-09-07 NOTE — Assessment & Plan Note (Addendum)
She reports not taking rosuvastatin 40 mg as instructed by her endocrinologist due to elevated liver function on 08/09/2022 Will assess cholesterol today Encouraged to take ezetimibe 10 mg daily Will make adjustments to her cholesterol medication after examination of her cholesterol levels Lab Results  Component Value Date   CHOL 260 (H) 05/10/2022   HDL 51 05/10/2022   LDLCALC 186 (H) 05/10/2022   TRIG 125 05/10/2022   CHOLHDL 5.1 (H) 05/10/2022

## 2022-09-07 NOTE — Assessment & Plan Note (Signed)
Patient educated on CDC recommendation for the vaccine. Verbal consent was obtained from the patient, vaccine administered by nurse, no sign of adverse reactions noted at this time. Patient education on arm soreness and use of tylenol or ibuprofen for this patient  was discussed. Patient educated on the signs and symptoms of adverse effect and advise to contact the office if they occur.  

## 2022-09-07 NOTE — Progress Notes (Signed)
Established Patient Office Visit  Subjective:  Patient ID: Angela Rose, female    DOB: December 17, 1945  Age: 76 y.o. MRN: [id number]  CC:  Chief Complaint  Patient presents with   Follow-up    4 month f/u today.    Nasal Congestion    Pt reports nose dripping, started with a scratchy throat last night, more like head congestion.     HPI Angela Rose is a 76 y.o. female with past medical history of of MI in [place], HTN, HLD, PAD, former smoker, carotid atherosclerosis, no hx of stroke  presents for f/u of  chronic medical conditions.  Hyperlipidemia: She reports not taking rosuvastatin 40 mg as instructed by her endocrinologist due to elevated liver function on 08/09/2022.  She was informed to return for repeat labs in 2 weeks but reports returning for labs in 3 weeks and was told that there were no orders placed.  She reports taking ezetimibe 10 mg daily.  Hypertension: She takes hydrochlorothiazide 25 mg daily, amlodipine 2.5 mg daily and enalapril 5 mg daily.  She reports compliance with treatment regimen.  Type 2 diabetes: She takes metformin 1000 mg daily and Levemir 60 units nightly.  She denies polyuria, polyphagia, polydipsia.  Chronic sinusitis: She reports follow-up with ENT today.  She complains of sinus pain and pressure, runny nose, congestion and sore throat since 09/06/2022.  She reports minimal relief of her symptoms with azelastine nasal spray and noted not to have taken her Zyrtec 5 mg.  Past Medical History:  Diagnosis Date   Anxiety    Cancer ([place])    melanoma face   Depression    Diabetes mellitus without complication ([place])    Fracture of tibial plateau 12/31/2015   Hyperlipidemia    Hypertension     Past Surgical History:  Procedure Laterality Date   ABDOMINAL HYSTERECTOMY     APPENDECTOMY     BASAL CELL CARCINOMA EXCISION     BREAST BIOPSY Right    needle bx-neg   CATARACT EXTRACTION, BILATERAL Bilateral 11/23/2018    CORONARY ARTERY BYPASS GRAFT  1996   x 3   MELANOMA EXCISION     PARTIAL HYSTERECTOMY  1976   ovaries remain    Family History  Problem Relation Age of Onset   Skin cancer Mother    Squamous cell carcinoma Mother    Basal cell carcinoma Mother    Heart disease Father    CAD Father    Breast cancer Neg Hx     Social History   Socioeconomic History   Marital status: Significant Other    Spouse name: Not on file   Number of children: 3   Years of education: Not on file   Highest education level: Some college, no degree  Occupational History   Occupation: retired  Tobacco Use   Smoking status: Former    Types: Cigarettes    Quit date: 06/11/1982    Years since quitting: 40.2   Smokeless tobacco: Never   Tobacco comments:    smoking cessation materials not required  Vaping Use   Vaping Use: Never used  Substance and Sexual Activity   Alcohol use: Yes    Alcohol/week: 0.0 standard drinks of alcohol    Comment: rare 1 wine glass of wine   Drug use: No   Sexual activity: Not on file  Other Topics Concern   Not on file  Social History Narrative   Not on file  Social Determinants of Health   Financial Resource Strain: Low Risk  (12/14/2021)   Overall Financial Resource Strain (CARDIA)    Difficulty of Paying Living Expenses: Not hard at all  Food Insecurity: No Food Insecurity (12/14/2021)   Hunger Vital Sign    Worried About Running Out of Food in the Last Year: Never true    Ran Out of Food in the Last Year: Never true  Transportation Needs: No Transportation Needs (12/14/2021)   PRAPARE - Hydrologist (Medical): No    Lack of Transportation (Non-Medical): No  Physical Activity: Inactive (12/14/2021)   Exercise Vital Sign    Days of Exercise per Week: 0 days    Minutes of Exercise per Session: 0 min  Stress: Stress Concern Present (12/14/2021)   [place]    Feeling of Stress  : To some extent  Social Connections: Moderately Isolated (12/14/2021)   Social Connection and Isolation Panel [NHANES]    Frequency of Communication with Friends and Family: More than three times a week    Frequency of Social Gatherings with Friends and Family: Once a week    Attends Religious Services: Never    Marine scientist or Organizations: No    Attends Archivist Meetings: Never    Marital Status: Living with partner  Intimate Partner Violence: Not At Risk (12/14/2021)   Humiliation, Afraid, Rape, and Kick questionnaire    Fear of Current or Ex-Partner: No    Emotionally Abused: No    Physically Abused: No    Sexually Abused: No    Outpatient Medications Prior to Visit  Medication Sig Dispense Refill   amLODipine (NORVASC) 2.5 MG tablet Take 1 tablet (2.5 mg total) by mouth daily. 90 tablet 3   aspirin 81 MG tablet Take by mouth every evening.      azelastine (ASTELIN) 0.1 % nasal spray Place 1 spray into both nostrils 2 (two) times daily. Use in each nostril as directed 30 mL 1   cetirizine (ZYRTEC) 5 MG tablet Take 1 tablet (5 mg total) by mouth daily. 30 tablet 0   enalapril (VASOTEC) 5 MG tablet Take 1 tablet (5 mg total) by mouth 2 (two) times daily. 180 tablet 3   ezetimibe (ZETIA) 10 MG tablet Take 1 tablet (10 mg total) by mouth daily. 90 tablet 3   FLUoxetine (PROZAC) 40 MG capsule TAKE 1 CAPSULE BY MOUTH  DAILY 90 capsule 3   glucose blood test strip Frequency:PHARMDIR   Dosage:0.0     Instructions:  Note:testing 3-4xqd, DX Code 250.00 Dose: 1     hydrochlorothiazide (HYDRODIURIL) 25 MG tablet TAKE 1 TABLET BY MOUTH  DAILY 90 tablet 3   Insulin Pen Needle (BD PEN NEEDLE NANO U/F) 32G X 4 MM MISC USE 1  TWICE DAILY 100 each 6   LEVEMIR FLEXPEN 100 UNIT/ML FlexPen INJECT 30 UNITS INTO THE SKIN TWICE DAILY. 15 mL 0   levothyroxine (SYNTHROID) 50 MCG tablet TAKE 1 TABLET BY MOUTH DAILY  BEFORE BREAKFAST 90 tablet 3   metFORMIN (GLUCOPHAGE-XR) 500 MG 24 hr  tablet Take 2 tablets (1,000 mg total) by mouth daily with breakfast. 360 tablet 3   rosuvastatin (CRESTOR) 40 MG tablet Take 40 mg by mouth daily.     sodium chloride (OCEAN) 0.65 % SOLN nasal spray Place 1 spray into both nostrils as needed for congestion. 30 mL 0   No facility-administered medications prior to  visit.    Allergies  Allergen Reactions   Propoxyphene Nausea And Vomiting    Other reaction(s): Vomiting   Lidocaine-Epinephrine     Other reaction(s): Other (See Comments) palpitatins and chest pain   Other Other (See Comments)    sutures - in her hand-very irritated   Tape Rash    BANDAIDS. BANDAIDS   Xylocaine  [Lidocaine Hcl]     Other reaction(s): Other (See Comments) palpitatins and chest pain   Jardiance [Empagliflozin] Other (See Comments)    Recurrent vaginal infections    ROS Review of Systems  Constitutional:  Negative for chills and fever.  HENT:  Positive for congestion, rhinorrhea and sore throat. Negative for sinus pressure and sinus pain.   Eyes:  Negative for visual disturbance.  Respiratory:  Negative for cough, chest tightness and shortness of breath.   Cardiovascular:  Negative for chest pain and palpitations.  Neurological:  Negative for dizziness, light-headedness and headaches.      Objective:    Physical Exam HENT:     Head: Normocephalic.     Nose: Rhinorrhea present.  Cardiovascular:     Rate and Rhythm: Normal rate and regular rhythm.     Pulses: Normal pulses.     Heart sounds: Normal heart sounds.  Neurological:     Mental Status: She is alert.     BP 122/76   Pulse (!) 56   Ht _0  (1.651 m)   Wt 154 lb 1.9 oz (69.9 kg)   SpO2 96%   BMI 25.65 kg/m  Wt Readings from Last 3 Encounters:  09/07/22 154 lb 1.9 oz (69.9 kg)  07/06/22 157 lb 9.6 oz (71.5 kg)  07/02/22 158 lb 1.9 oz (71.7 kg)    Lab Results  Component Value Date   TSH 1.540 08/03/2022   Lab Results  Component Value Date   WBC 8.2 02/10/2022   HGB  11.4 02/10/2022   HCT 34.8 02/10/2022   MCV 87 02/10/2022   PLT 318 02/10/2022   Lab Results  Component Value Date   NA 139 08/23/2022   K 4.3 08/23/2022   CO2 27 08/23/2022   GLUCOSE 219 (H) 08/23/2022   BUN 27 08/23/2022   CREATININE 1.11 (H) 08/23/2022   BILITOT 0.3 08/23/2022   ALKPHOS 74 08/23/2022   AST 18 08/23/2022   ALT 15 08/23/2022   PROT 6.5 08/23/2022   ALBUMIN 4.1 08/23/2022   CALCIUM 9.4 08/23/2022   EGFR 51 (L) 08/23/2022   Lab Results  Component Value Date   CHOL 260 (H) 05/10/2022   Lab Results  Component Value Date   HDL 51 05/10/2022   Lab Results  Component Value Date   LDLCALC 186 (H) 05/10/2022   Lab Results  Component Value Date   TRIG 125 05/10/2022   Lab Results  Component Value Date   CHOLHDL 5.1 (H) 05/10/2022   Lab Results  Component Value Date   HGBA1C 7.9 (H) 02/10/2022      Assessment & Plan:  Essential (primary) hypertension Assessment & Plan: Controlled Encouraged to continue taking hydrochlorothiazide 25 mg daily, amlodipine 2.5 mg daily and enalapril 5 mg daily BP Readings from Last 3 Encounters:  09/07/22 122/76  07/06/22 129/72  07/02/22 122/82     Orders: -     CBC with Differential/Platelet  Chronic maxillary sinusitis Assessment & Plan: Encouraged to follow-up with ENT today Recommended use of humidifier at home Encourage warm salt gargles 3-4 times daily for sore throat Encouraged to continue  using azelastine nasal spray until otherwise told by ENT Encouraged to continue taking Zyrtec 5 mg daily   Type II diabetes mellitus with complication (HCC) Assessment & Plan: Encouraged to continue taking metformin 1000 mg daily and Levemir 60 units nightly Will assess hemoglobin A1c today Lab Results  Component Value Date   HGBA1C 7.9 (H) 02/10/2022     Orders: -     Hemoglobin A1c -     Microalbumin / creatinine urine ratio  Hyperlipidemia associated with type 2 diabetes mellitus ([place]) Assessment &  Plan: She reports not taking rosuvastatin 40 mg as instructed by her endocrinologist due to elevated liver function on 08/09/2022 Will assess cholesterol today Encouraged to take ezetimibe 10 mg daily Will make adjustments to her cholesterol medication after examination of her cholesterol levels Lab Results  Component Value Date   CHOL 260 (H) 05/10/2022   HDL 51 05/10/2022   LDLCALC 186 (H) 05/10/2022   TRIG 125 05/10/2022   CHOLHDL 5.1 (H) 05/10/2022     Orders: -     Lipid panel  Need for immunization against influenza Assessment & Plan: Patient educated on CDC recommendation for the vaccine. Verbal consent was obtained from the patient, vaccine administered by nurse, no sign of adverse reactions noted at this time. Patient education on arm soreness and use of tylenol or ibuprofen for this patient  was discussed. Patient educated on the signs and symptoms of adverse effect and advise to contact the office if they occur.    Flu vaccine need -     Flu Vaccine QUAD High Dose(Fluad)  Hypothyroidism, postablative -     TSH + free T4  Vitamin D deficiency -     VITAMIN D 25 Hydroxy (Vit-D Deficiency, Fractures)    Follow-up: No follow-ups on file.   Angela Monday, Angela Rose

## 2022-09-07 NOTE — Assessment & Plan Note (Signed)
Encouraged to continue taking metformin 1000 mg daily and Levemir 60 units nightly Will assess hemoglobin A1c today Lab Results  Component Value Date   HGBA1C 7.9 (H) 02/10/2022

## 2022-09-10 ENCOUNTER — Ambulatory Visit
Admission: EM | Admit: 2022-09-10 | Discharge: 2022-09-10 | Disposition: A | Discharge status: Home / Self Care | Payer: Medicare Other | Attending: Family Medicine | Admitting: Family Medicine

## 2022-09-10 ENCOUNTER — Encounter: Payer: Self-pay | Admitting: Emergency Medicine

## 2022-09-10 DIAGNOSIS — J069 Acute upper respiratory infection, unspecified: Secondary | ICD-10-CM | POA: Diagnosis not present

## 2022-09-10 DIAGNOSIS — Z20822 Contact with and (suspected) exposure to covid-19: Secondary | ICD-10-CM | POA: Insufficient documentation

## 2022-09-10 LAB — RESP PANEL BY RT-PCR (FLU A&B, COVID) ARPGX2
Influenza A by PCR: NEGATIVE
Influenza B by PCR: NEGATIVE
SARS Coronavirus 2 by RT PCR: NEGATIVE

## 2022-09-10 MED ORDER — PROMETHAZINE-DM 6.25-15 MG/5ML PO SYRP: 5.0000 mL | ORAL_SOLUTION | Freq: Four times a day (QID) | ORAL | 0 refills | Status: DC | PRN

## 2022-09-10 MED ORDER — GUAIFENESIN ER 600 MG PO TB12: 600.0000 mg | ORAL_TABLET | Freq: Two times a day (BID) | ORAL | 0 refills | Status: DC

## 2022-09-10 MED ORDER — MOLNUPIRAVIR EUA 200MG CAPSULE: 4.0000 | ORAL_CAPSULE | Freq: Two times a day (BID) | ORAL | 0 refills | Status: AC

## 2022-09-10 NOTE — ED Provider Notes (Signed)
[facility]    CSN: [id number] Arrival date & time: 09/10/22  [place]      History   Chief Complaint No chief complaint on file.   HPI Angela Rose is a 76 y.o. female.   Presenting today with 5-day history of congestion, cough, fatigue, body aches, chills, sore throat.  Denies chest pain, shortness of breath, abdominal pain, nausea vomiting or diarrhea.  So far not tried anything over-the-counter for symptoms.  Home COVID test tonight was negative but significant others was positive.    Past Medical History:  Diagnosis Date   Anxiety    Cancer ([place])    melanoma face   Depression    Diabetes mellitus without complication ([place])    Fracture of tibial plateau 12/31/2015   Hyperlipidemia    Hypertension     Patient Active Problem List   Diagnosis Date Noted   Need for immunization against influenza 09/07/2022   Chronic sinusitis 07/02/2022   Headache 07/02/2022   AKI (acute kidney injury) ([place]) 05/06/2022   Fatigue 02/10/2022   Allergies 11/09/2021   Constipation 11/09/2021   Cataract, nuclear sclerotic, both eyes 11/08/2018   OAB (overactive bladder) 08/11/2018   Tinnitus aurium, bilateral 08/11/2018   Renal cyst, right 05/01/2018   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   Other tear of medial meniscus, current injury, left knee, initial encounter 12/31/2015   Type II diabetes mellitus with complication ([place]) 93/81/8299   Hyperlipidemia associated with type 2 diabetes mellitus ([place]) 08/14/2015   Coronary artery disease involving native coronary artery of native heart without angina pectoris 08/14/2015   Basal cell carcinoma 08/11/2015   History of melanoma 08/11/2015   Major depression in partial remission ([place]) 08/08/2015   Non-proliferative diabetic retinopathy, both eyes ([place]) 08/08/2015   History of colon polyps 08/08/2015   Hypothyroidism, postablative 08/08/2015   Carotid artery plaque 09/03/2014   Essential (primary) hypertension 09/03/2014   APC  (atrial premature contractions) 09/03/2014    Past Surgical History:  Procedure Laterality Date   ABDOMINAL HYSTERECTOMY     APPENDECTOMY     BASAL CELL CARCINOMA EXCISION     BREAST BIOPSY Right    needle bx-neg   CATARACT EXTRACTION, BILATERAL Bilateral 11/23/2018   CORONARY ARTERY BYPASS GRAFT  1996   x 3   MELANOMA EXCISION     PARTIAL HYSTERECTOMY  1976   ovaries remain    OB History   No obstetric history on file.      Home Medications    Prior to Admission medications   Medication Sig Start Date End Date Taking? Authorizing Provider  guaiFENesin (MUCINEX) 600 MG 12 hr tablet Take 1 tablet (600 mg total) by mouth 2 (two) times daily. 09/10/22  Yes Volney American, PA-C  molnupiravir EUA (LAGEVRIO) 200 mg CAPS capsule Take 4 capsules (800 mg total) by mouth 2 (two) times daily for 5 days. 09/10/22 09/15/22 Yes Volney American, PA-C  promethazine-dextromethorphan (PROMETHAZINE-DM) 6.25-15 MG/5ML syrup Take 5 mLs by mouth 4 (four) times daily as needed. 09/10/22  Yes Volney American, PA-C  amLODipine (NORVASC) 2.5 MG tablet Take 1 tablet (2.5 mg total) by mouth daily. 08/10/22   [name], FNP  aspirin 81 MG tablet Take by mouth every evening.     [provider]  azelastine (ASTELIN) 0.1 % nasal spray Place 1 spray into both nostrils 2 (two) times daily. Use in each nostril as directed 07/02/22   [name], FNP  cetirizine (ZYRTEC) 5 MG tablet Take  1 tablet (5 mg total) by mouth daily. 07/02/22   [name], FNP  enalapril (VASOTEC) 5 MG tablet Take 1 tablet (5 mg total) by mouth 2 (two) times daily. 08/10/22   [name], FNP  ezetimibe (ZETIA) 10 MG tablet Take 1 tablet (10 mg total) by mouth daily. 02/10/22   Renee Rival, FNP  FLUoxetine (PROZAC) 40 MG capsule TAKE 1 CAPSULE BY MOUTH  DAILY 11/20/21   Glean Hess, MD  glucose blood test strip Frequency:PHARMDIR   Dosage:0.0     Instructions:  Note:testing 3-4xqd, DX  Code 250.00 Dose: 1 12/24/08   [provider]  hydrochlorothiazide (HYDRODIURIL) 25 MG tablet TAKE 1 TABLET BY MOUTH  DAILY 11/20/21   Glean Hess, MD  Insulin Pen Needle (BD PEN NEEDLE NANO U/F) 32G X 4 MM MISC USE 1  TWICE DAILY 05/20/22   Paseda, Folashade R, FNP  LEVEMIR FLEXPEN 100 UNIT/ML FlexPen INJECT 30 UNITS INTO THE SKIN TWICE DAILY. 05/20/22   Renee Rival, FNP  levothyroxine (SYNTHROID) 50 MCG tablet TAKE 1 TABLET BY MOUTH DAILY  BEFORE BREAKFAST 01/05/22   Paseda, Dewaine Conger, FNP  metFORMIN (GLUCOPHAGE-XR) 500 MG 24 hr tablet Take 2 tablets (1,000 mg total) by mouth daily with breakfast. 02/17/22   Brita Romp, NP  rosuvastatin (CRESTOR) 40 MG tablet Take 40 mg by mouth daily. 03/31/22   [provider]  sodium chloride (OCEAN) 0.65 % SOLN nasal spray Place 1 spray into both nostrils as needed for congestion. 11/09/21   Renee Rival, FNP    Family History Family History  Problem Relation Age of Onset   Skin cancer Mother    Squamous cell carcinoma Mother    Basal cell carcinoma Mother    Heart disease Father    CAD Father    Breast cancer Neg Hx     Social History Social History   Tobacco Use   Smoking status: Former    Types: Cigarettes    Quit date: 06/11/1982    Years since quitting: 40.2   Smokeless tobacco: Never   Tobacco comments:    smoking cessation materials not required  Vaping Use   Vaping Use: Never used  Substance Use Topics   Alcohol use: Yes    Alcohol/week: 0.0 standard drinks of alcohol    Comment: rare 1 wine glass of wine   Drug use: No     Allergies   Propoxyphene, Lidocaine-epinephrine, Other, Tape, Xylocaine  [lidocaine hcl], and Jardiance [empagliflozin]   Review of Systems Review of Systems HPI  Physical Exam Triage Vital Signs ED Triage Vitals  Enc Vitals Group     BP 09/10/22 1855 118/68     Pulse Rate 09/10/22 1855 80     Resp 09/10/22 1855 18     Temp 09/10/22 1855 98.5 F (36.9  C)     Temp Source 09/10/22 1855 Oral     SpO2 09/10/22 1855 95 %     Weight --      Height --      Head Circumference --      Peak Flow --      Pain Score 09/10/22 1856 0     Pain Loc --      Pain Edu? --      Excl. in [place]? --    No data found.  Updated Vital Signs BP 118/68 (BP Location: Right Arm)   Pulse 80   Temp 98.5 F (36.9 C) (Oral)  Resp 18   SpO2 95%   Visual Acuity Right Eye Distance:   Left Eye Distance:   Bilateral Distance:    Right Eye Near:   Left Eye Near:    Bilateral Near:     Physical Exam Vitals and nursing note reviewed.  Constitutional:      Appearance: Normal appearance.  HENT:     Head: Atraumatic.     Right Ear: Tympanic membrane and external ear normal.     Left Ear: Tympanic membrane and external ear normal.     Nose: Rhinorrhea present.     Mouth/Throat:     Mouth: Mucous membranes are moist.     Pharynx: Posterior oropharyngeal erythema present.  Eyes:     Extraocular Movements: Extraocular movements intact.     Conjunctiva/sclera: Conjunctivae normal.  Cardiovascular:     Rate and Rhythm: Normal rate and regular rhythm.     Heart sounds: Normal heart sounds.  Pulmonary:     Effort: Pulmonary effort is normal.     Breath sounds: Normal breath sounds. No wheezing or rales.  Musculoskeletal:        General: Normal range of motion.     Cervical back: Normal range of motion and neck supple.  Skin:    General: Skin is warm and dry.  Neurological:     Mental Status: She is alert and oriented to person, place, and time.  Psychiatric:        Mood and Affect: Mood normal.        Thought Content: Thought content normal.    UC Treatments / Results  Labs (all labs ordered are listed, but only abnormal results are displayed) Labs Reviewed  RESP PANEL BY RT-PCR (FLU A&B, COVID) [id number]    EKG   Radiology No results found.  Procedures Procedures (including critical care time)  Medications Ordered in UC Medications - No  data to display  Initial Impression / Assessment and Plan / UC Course  I have reviewed the triage vital signs and the nursing notes.  Pertinent labs & imaging results that were available during my care of the patient were reviewed by me and considered in my medical decision making (see chart for details).     Vitals and exam overall reassuring and suggestive of a viral upper respiratory infection.  Respiratory panel pending for confirmation but given significant others COVID-positive result do suspect this to be causing her symptoms.  Will start with molnupiravir, Phenergan DM, Mucinex and discussed supportive over-the-counter medications and home care.  Return for worsening symptoms.  Final Clinical Impressions(s) / UC Diagnoses   Final diagnoses:  Viral URI with cough  Exposure to COVID-19 virus   Discharge Instructions   None    ED Prescriptions     Medication Sig Dispense Auth. Provider   molnupiravir EUA (LAGEVRIO) 200 mg CAPS capsule Take 4 capsules (800 mg total) by mouth 2 (two) times daily for 5 days. 40 capsule Volney American, [place]   promethazine-dextromethorphan (PROMETHAZINE-DM) 6.25-15 MG/5ML syrup Take 5 mLs by mouth 4 (four) times daily as needed. 100 mL Volney American, PA-C   guaiFENesin (MUCINEX) 600 MG 12 hr tablet Take 1 tablet (600 mg total) by mouth 2 (two) times daily. 20 tablet Volney American, [place]      PDMP not reviewed this encounter.   Volney American, [place] 09/10/22 1948

## 2022-09-10 NOTE — ED Triage Notes (Signed)
Nasal congestion and drainage and productive cough with yellow sputum.  Home covid test negative tonight.

## 2022-09-15 ENCOUNTER — Other Ambulatory Visit: Payer: Self-pay

## 2022-09-15 ENCOUNTER — Emergency Department (HOSPITAL_COMMUNITY)
Admission: EM | Admit: 2022-09-15 | Discharge: 2022-09-15 | Disposition: A | Discharge status: Home / Self Care | Payer: Medicare Other | Attending: Emergency Medicine | Admitting: Emergency Medicine

## 2022-09-15 ENCOUNTER — Encounter (HOSPITAL_COMMUNITY): Payer: Self-pay

## 2022-09-15 DIAGNOSIS — Z7982 Long term (current) use of aspirin: Secondary | ICD-10-CM | POA: Insufficient documentation

## 2022-09-15 DIAGNOSIS — K047 Periapical abscess without sinus: Secondary | ICD-10-CM | POA: Insufficient documentation

## 2022-09-15 MED ORDER — AMOXICILLIN-POT CLAVULANATE 875-125 MG PO TABS: 1.0000 | ORAL_TABLET | Freq: Two times a day (BID) | ORAL | 0 refills | Status: DC

## 2022-09-15 NOTE — ED Triage Notes (Signed)
Pt presents to ED states back in Oct she had 2 teeth removed on top right side, pt states recently she has had green drainage from that area and low grade fever x couple of days.

## 2022-09-15 NOTE — Discharge Instructions (Signed)
Take the antibiotic twice a day as prescribed for the next 7 days, warm compresses, ibuprofen 3 times a day as needed, follow-up with a dentist of your choice or the 1 listed above.  If you cannot see an oral surgeon you should at least see the dentist for a recheck.  If you have more swelling pain or fever return to the ER

## 2022-09-15 NOTE — ED Provider Notes (Signed)
[facility] EMERGENCY DEPARTMENT Provider Note   CSN: [id number] Arrival date & time: 09/15/22  1633     History  Chief Complaint  Patient presents with   Dental Problem    Angela Rose is a 76 y.o. female.  HPI   76 year old female presenting after having dental extractions in the past, she had right upper rear molars removed and now states that she has small amounts of foul tasting drainage that is coming from that area with a small amount of swelling.  She does not have a fever and has not had any nausea or vomiting but just feels generally fatigued.  She has associated dental pain in that area  Home Medications Prior to Admission medications   Medication Sig Start Date End Date Taking? Authorizing Provider  amoxicillin-clavulanate (AUGMENTIN) 875-125 MG tablet Take 1 tablet by mouth every 12 (twelve) hours. 09/15/22  Yes Noemi Chapel, MD  amLODipine (NORVASC) 2.5 MG tablet Take 1 tablet (2.5 mg total) by mouth daily. 08/10/22   [name], FNP  aspirin 81 MG tablet Take by mouth every evening.     [provider]  azelastine (ASTELIN) 0.1 % nasal spray Place 1 spray into both nostrils 2 (two) times daily. Use in each nostril as directed 07/02/22   [name], FNP  cetirizine (ZYRTEC) 5 MG tablet Take 1 tablet (5 mg total) by mouth daily. 07/02/22   [name], FNP  enalapril (VASOTEC) 5 MG tablet Take 1 tablet (5 mg total) by mouth 2 (two) times daily. 08/10/22   [name], FNP  ezetimibe (ZETIA) 10 MG tablet Take 1 tablet (10 mg total) by mouth daily. 02/10/22   Renee Rival, FNP  FLUoxetine (PROZAC) 40 MG capsule TAKE 1 CAPSULE BY MOUTH  DAILY 11/20/21   Glean Hess, MD  glucose blood test strip Frequency:PHARMDIR   Dosage:0.0     Instructions:  Note:testing 3-4xqd, DX Code 250.00 Dose: 1 12/24/08   [provider]  guaiFENesin (MUCINEX) 600 MG 12 hr tablet Take 1 tablet (600 mg total) by mouth 2 (two) times daily. 09/10/22    Volney American, PA-C  hydrochlorothiazide (HYDRODIURIL) 25 MG tablet TAKE 1 TABLET BY MOUTH  DAILY 11/20/21   Glean Hess, MD  Insulin Pen Needle (BD PEN NEEDLE NANO U/F) 32G X 4 MM MISC USE 1  TWICE DAILY 05/20/22   Paseda, Folashade R, FNP  LEVEMIR FLEXPEN 100 UNIT/ML FlexPen INJECT 30 UNITS INTO THE SKIN TWICE DAILY. 05/20/22   Renee Rival, FNP  levothyroxine (SYNTHROID) 50 MCG tablet TAKE 1 TABLET BY MOUTH DAILY  BEFORE BREAKFAST 01/05/22   Paseda, Dewaine Conger, FNP  metFORMIN (GLUCOPHAGE-XR) 500 MG 24 hr tablet Take 2 tablets (1,000 mg total) by mouth daily with breakfast. 02/17/22   Brita Romp, NP  molnupiravir EUA (LAGEVRIO) 200 mg CAPS capsule Take 4 capsules (800 mg total) by mouth 2 (two) times daily for 5 days. 09/10/22 09/15/22  Volney American, PA-C  promethazine-dextromethorphan (PROMETHAZINE-DM) 6.25-15 MG/5ML syrup Take 5 mLs by mouth 4 (four) times daily as needed. 09/10/22   Volney American, PA-C  rosuvastatin (CRESTOR) 40 MG tablet Take 40 mg by mouth daily. 03/31/22   [provider]  sodium chloride (OCEAN) 0.65 % SOLN nasal spray Place 1 spray into both nostrils as needed for congestion. 11/09/21   Renee Rival, FNP      Allergies    Propoxyphene, Lidocaine-epinephrine, Other, Tape, Xylocaine  [lidocaine hcl], and Jardiance [empagliflozin]  Review of Systems   Review of Systems  Constitutional:  Negative for fever.  HENT:  Positive for dental problem.     Physical Exam Updated Vital Signs BP 123/60 (BP Location: Right Arm)   Pulse 87   Temp 98.2 F (36.8 C) (Oral)   Resp 18   Ht 1.651 m ('5\' 5"'$ )   Wt 69.9 kg   SpO2 97%   BMI 25.63 kg/m  Physical Exam Vitals and nursing note reviewed.  Constitutional:      Appearance: She is well-developed. She is not diaphoretic.  HENT:     Head: Normocephalic and atraumatic.     Mouth/Throat:     Comments: No trismus or torticollis, no lymphadenopathy of the neck, she has  multiple teeth which have been extracted, the right upper jaw appears to be mildly swollen locally but no facial swelling, no trismus or torticollis Eyes:     General:        Right eye: No discharge.        Left eye: No discharge.     Conjunctiva/sclera: Conjunctivae normal.  Pulmonary:     Effort: Pulmonary effort is normal. No respiratory distress.  Skin:    General: Skin is warm and dry.     Findings: No erythema or rash.  Neurological:     Mental Status: She is alert.     Coordination: Coordination normal.     ED Results / Procedures / Treatments   Labs (all labs ordered are listed, but only abnormal results are displayed) Labs Reviewed - No data to display  EKG None  Radiology No results found.  Procedures Procedures    Medications Ordered in ED Medications - No data to display  ED Course/ Medical Decision Making/ A&P                           Medical Decision Making  Patient has what appears to be an early dental abscess, there is no signs of swelling of the oropharynx or the posterior pharynx, no signs of trismus or torticollis or Ludwig's angina.  She will be placed on antibiotic and referred to a dentist in the outpatient setting.  She has had difficulty following up with a dental surgeon as her teeth were pulled at some health clinic and she does not have an established relationship.  She has been calling around to different offices but has not found anybody to see her.  Thankfully at this time she does not need to be urgently transferred or admitted for this condition.        Final Clinical Impression(s) / ED Diagnoses Final diagnoses:  Dental abscess    Rx / DC Orders ED Discharge Orders          Ordered    amoxicillin-clavulanate (AUGMENTIN) 875-125 MG tablet  Every 12 hours        09/15/22 1706              Noemi Chapel, MD 09/15/22 1709

## 2022-09-27 ENCOUNTER — Telehealth: Payer: Self-pay

## 2022-09-27 NOTE — Telephone Encounter (Signed)
     Patient  visit on 12/6  at [place] you been able to follow up with your primary care physician? Yes   The patient was or was not able to obtain any needed medicine or equipment. Yes   Are there diet recommendations that you are having difficulty following? NA   Patient expresses understanding of discharge instructions and education provided has no other needs at this time. Yes     [place], [facility], Care Management  [phone] [address], [place], [place] [address] Phone: [phone] Email: [email or web address]

## 2022-10-11 DIAGNOSIS — T7840XA Allergy, unspecified, initial encounter: Secondary | ICD-10-CM

## 2022-10-11 DIAGNOSIS — R519 Headache, unspecified: Secondary | ICD-10-CM

## 2022-10-11 DIAGNOSIS — K5900 Constipation, unspecified: Secondary | ICD-10-CM

## 2022-10-11 DIAGNOSIS — R5383 Other fatigue: Secondary | ICD-10-CM

## 2022-10-11 HISTORY — DX: Allergy, unspecified, initial encounter: T78.40XA

## 2022-10-11 HISTORY — DX: Other fatigue: R53.83

## 2022-10-11 HISTORY — DX: Constipation, unspecified: K59.00

## 2022-10-11 HISTORY — DX: Headache, unspecified: R51.9

## 2022-10-25 ENCOUNTER — Telehealth: Payer: Self-pay | Admitting: Nurse Practitioner

## 2022-10-25 DIAGNOSIS — J320 Chronic maxillary sinusitis: Secondary | ICD-10-CM | POA: Diagnosis not present

## 2022-10-25 DIAGNOSIS — J342 Deviated nasal septum: Secondary | ICD-10-CM | POA: Diagnosis not present

## 2022-10-25 DIAGNOSIS — E1122 Type 2 diabetes mellitus with diabetic chronic kidney disease: Secondary | ICD-10-CM

## 2022-10-25 DIAGNOSIS — J343 Hypertrophy of nasal turbinates: Secondary | ICD-10-CM | POA: Diagnosis not present

## 2022-10-25 DIAGNOSIS — E890 Postprocedural hypothyroidism: Secondary | ICD-10-CM

## 2022-10-25 NOTE — Telephone Encounter (Signed)
Lab order given to spouse

## 2022-10-25 NOTE — Telephone Encounter (Signed)
She will need additional updated labs, I have already entered the order

## 2022-10-25 NOTE — Telephone Encounter (Signed)
Can you see pt with the most recent labs or do you want her to do more labs?

## 2022-10-27 ENCOUNTER — Other Ambulatory Visit (HOSPITAL_COMMUNITY): Payer: Self-pay | Admitting: Family Medicine

## 2022-10-27 ENCOUNTER — Telehealth: Payer: Self-pay | Admitting: Family Medicine

## 2022-10-27 ENCOUNTER — Other Ambulatory Visit: Payer: Self-pay

## 2022-10-27 ENCOUNTER — Other Ambulatory Visit: Payer: Self-pay | Admitting: Otolaryngology

## 2022-10-27 DIAGNOSIS — Z1231 Encounter for screening mammogram for malignant neoplasm of breast: Secondary | ICD-10-CM

## 2022-10-27 DIAGNOSIS — E113299 Type 2 diabetes mellitus with mild nonproliferative diabetic retinopathy without macular edema, unspecified eye: Secondary | ICD-10-CM

## 2022-10-27 DIAGNOSIS — J329 Chronic sinusitis, unspecified: Secondary | ICD-10-CM

## 2022-10-27 NOTE — Telephone Encounter (Signed)
Spoke to [facility] they are stating that insurance will only cover a 30day prescription, if this is prolonged she will need a PA. Called pt to let her know, states she is under a [employer] study and A1c is being monitored by them and Endo to see what medication she should be on.

## 2022-10-27 NOTE — Telephone Encounter (Signed)
She no showed her appt with me in August.  Looks like [facility] was filling her insulin.  I cannot make any recommendations until she has follow up with me.

## 2022-10-27 NOTE — Telephone Encounter (Signed)
F/u  The patient is asking for a call back from the nurse regarding LEVEMIR FLEXPEN 100 UNIT/ML FlexPen

## 2022-10-27 NOTE — Telephone Encounter (Signed)
Patient called to let provider know she has changed insurance and now has [employer]. Reiceved a phone call from [facility] requested provider call [phone] regarding  LEVEMIR FLEXPEN 100 UNIT/ML FlexPen  Would have to pay 400.00 for one refill if [facility] does not hear from provider.  Please return patient call at [phone]

## 2022-10-28 ENCOUNTER — Telehealth: Payer: Self-pay | Admitting: Family Medicine

## 2022-10-28 ENCOUNTER — Other Ambulatory Visit: Payer: Self-pay

## 2022-10-28 DIAGNOSIS — I10 Essential (primary) hypertension: Secondary | ICD-10-CM

## 2022-10-28 MED ORDER — AMLODIPINE BESYLATE 2.5 MG PO TABS: 2.5000 mg | ORAL_TABLET | Freq: Every day | ORAL | 3 refills | Status: DC

## 2022-10-28 MED ORDER — ENALAPRIL MALEATE 5 MG PO TABS: 5.0000 mg | ORAL_TABLET | Freq: Two times a day (BID) | ORAL | 3 refills | Status: DC

## 2022-10-28 NOTE — Telephone Encounter (Signed)
Prescription Request  10/28/2022  Is this a "Controlled Substance" medicine? No  LOV: 09/07/2022  What is the name of the medication or equipment?  amLODipine (NORVASC) 2.5 MG tablet  enalapril (VASOTEC) 5 MG tablet  FLUoxetine (PROZAC) 40 MG capsule  hydrochlorothiazide (HYDRODIURIL) 25 MG tablet  levothyroxine (SYNTHROID) 50 MCG tablet  metFORMIN (GLUCOPHAGE-XR) 500 MG 24 hr tablet  TRUE METER (meter, test strips, lancets & single use alcohol swabs)    Have you contacted your pharmacy to request a refill? Yes   Which pharmacy would you like this sent to?  [facility] ([place], [place] [facility] [address] [place] [address] [place] [address] Phone: [phone] Fax: [place], [place] [address] Phone: [phone] Fax: [phone]  [place], [place] [address] Phone: [phone] Fax: [phone]  [facility] [phone] - [place], [place] AT [place] [address] [place] [address] Phone: [phone] Fax: [phone]  [facility] - [place], [place] [address] Phone: [phone] Fax: [phone]    Patient notified that their request is being sent to the clinical staff for review and that they should receive a response within 2 business days.   Please advise at [facility] [phone]

## 2022-11-01 ENCOUNTER — Other Ambulatory Visit: Payer: Self-pay | Admitting: Family Medicine

## 2022-11-01 DIAGNOSIS — E118 Type 2 diabetes mellitus with unspecified complications: Secondary | ICD-10-CM

## 2022-11-01 DIAGNOSIS — E890 Postprocedural hypothyroidism: Secondary | ICD-10-CM

## 2022-11-01 NOTE — Telephone Encounter (Signed)
She has 3 refills on hydrochlorothiazide, Synthroid, metformin

## 2022-11-03 ENCOUNTER — Ambulatory Visit (HOSPITAL_BASED_OUTPATIENT_CLINIC_OR_DEPARTMENT_OTHER): Payer: Medicare Other | Admitting: Internal Medicine

## 2022-11-12 ENCOUNTER — Other Ambulatory Visit: Payer: Self-pay

## 2022-11-12 ENCOUNTER — Telehealth: Payer: Self-pay | Admitting: Family Medicine

## 2022-11-12 DIAGNOSIS — E113299 Type 2 diabetes mellitus with mild nonproliferative diabetic retinopathy without macular edema, unspecified eye: Secondary | ICD-10-CM

## 2022-11-12 DIAGNOSIS — E118 Type 2 diabetes mellitus with unspecified complications: Secondary | ICD-10-CM

## 2022-11-12 MED ORDER — METFORMIN HCL ER 500 MG PO TB24: 1000.0000 mg | ORAL_TABLET | Freq: Every day | ORAL | 3 refills | Status: DC

## 2022-11-12 MED ORDER — LEVEMIR FLEXPEN 100 UNIT/ML ~~LOC~~ SOPN: PEN_INJECTOR | SUBCUTANEOUS | 1 refills | Status: DC

## 2022-11-12 NOTE — Telephone Encounter (Signed)
Patient called need a prior authorization on this medicine.  Without authorization cost over $400.00.  Needs a prior authorization. Patient asked for nurse to return her call not sure what to do without this medicine. Patient not sure what all meds to be taking.   LEVEMIR FLEXPEN 100 UNIT/ML FlexPen    Pharmacy:    [facility]. [place]  ALSO NEEDS metFORMIN (GLUCOPHAGE-XR) 500 MG 24 hr tablet  PER PATIENT DRO WHITNEY REARDON DOES NOT SEND THIS INTO HER PHARMACY.  Please return patient call ASAP

## 2022-11-12 NOTE — Telephone Encounter (Signed)
Refills sent pt states she is on levemir 20 units rather than 30, rx sent to [facility].

## 2022-11-15 ENCOUNTER — Other Ambulatory Visit: Payer: Self-pay

## 2022-11-15 ENCOUNTER — Telehealth: Payer: Self-pay | Admitting: Family Medicine

## 2022-11-15 DIAGNOSIS — I10 Essential (primary) hypertension: Secondary | ICD-10-CM

## 2022-11-15 DIAGNOSIS — E118 Type 2 diabetes mellitus with unspecified complications: Secondary | ICD-10-CM

## 2022-11-15 DIAGNOSIS — E890 Postprocedural hypothyroidism: Secondary | ICD-10-CM

## 2022-11-15 MED ORDER — AMLODIPINE BESYLATE 2.5 MG PO TABS: 2.5000 mg | ORAL_TABLET | Freq: Every day | ORAL | 2 refills | Status: DC

## 2022-11-15 MED ORDER — LEVOTHYROXINE SODIUM 50 MCG PO TABS: 50.0000 ug | ORAL_TABLET | Freq: Every day | ORAL | 3 refills | Status: DC

## 2022-11-15 MED ORDER — LANCET DEVICE MISC: 1.0000 | Freq: Three times a day (TID) | 0 refills | Status: DC

## 2022-11-15 MED ORDER — BLOOD GLUCOSE MONITORING SUPPL DEVI: 1.0000 | Freq: Three times a day (TID) | 0 refills | Status: DC

## 2022-11-15 MED ORDER — ENALAPRIL MALEATE 5 MG PO TABS: 5.0000 mg | ORAL_TABLET | Freq: Two times a day (BID) | ORAL | 2 refills | Status: DC

## 2022-11-15 MED ORDER — LANCETS MISC. MISC: 1.0000 | Freq: Three times a day (TID) | 0 refills | Status: DC

## 2022-11-15 MED ORDER — BLOOD GLUCOSE TEST VI STRP: 1.0000 | ORAL_STRIP | Freq: Three times a day (TID) | 0 refills | Status: DC

## 2022-11-15 MED ORDER — METFORMIN HCL ER 500 MG PO TB24: 1000.0000 mg | ORAL_TABLET | Freq: Every day | ORAL | 2 refills | Status: DC

## 2022-11-15 MED ORDER — HYDROCHLOROTHIAZIDE 25 MG PO TABS: 25.0000 mg | ORAL_TABLET | Freq: Every day | ORAL | 3 refills | Status: DC

## 2022-11-15 NOTE — Telephone Encounter (Signed)
[facility] called in on patient behalf.  Patient needs 90 day supplies of   amLODipine (NORVASC) 2.5 MG tablet   enalapril (VASOTEC) 5 MG tablet   hydrochlorothiazide (HYDRODIURIL) 25 MG tablet   metFORMIN (GLUCOPHAGE-XR) 500 MG 24 hr tablet   levothyroxine (SYNTHROID) 50 MCG tablet    Patient needs new script for  New metrix meter  New metrix test strips  True plus 33g lancets  Level 1 control solution   Phone [phone] Fax [phone]

## 2022-11-15 NOTE — Telephone Encounter (Signed)
Refills sent

## 2022-11-26 ENCOUNTER — Ambulatory Visit (HOSPITAL_COMMUNITY)
Admission: RE | Admit: 2022-11-26 | Discharge: 2022-11-26 | Disposition: A | Discharge status: Home / Self Care | Payer: Medicare HMO | Source: Ambulatory Visit | Attending: Family Medicine | Admitting: Family Medicine

## 2022-11-26 ENCOUNTER — Encounter (HOSPITAL_COMMUNITY): Payer: Self-pay

## 2022-11-26 DIAGNOSIS — Z1231 Encounter for screening mammogram for malignant neoplasm of breast: Secondary | ICD-10-CM | POA: Insufficient documentation

## 2022-11-29 ENCOUNTER — Other Ambulatory Visit: Payer: Self-pay

## 2022-11-29 ENCOUNTER — Telehealth: Payer: Self-pay | Admitting: Family Medicine

## 2022-11-29 DIAGNOSIS — E118 Type 2 diabetes mellitus with unspecified complications: Secondary | ICD-10-CM

## 2022-11-29 DIAGNOSIS — E890 Postprocedural hypothyroidism: Secondary | ICD-10-CM

## 2022-11-29 DIAGNOSIS — I10 Essential (primary) hypertension: Secondary | ICD-10-CM

## 2022-11-29 MED ORDER — LANCET DEVICE MISC: 1.0000 | Freq: Three times a day (TID) | 0 refills | Status: AC

## 2022-11-29 MED ORDER — LEVOTHYROXINE SODIUM 50 MCG PO TABS: 50.0000 ug | ORAL_TABLET | Freq: Every day | ORAL | 3 refills | Status: DC

## 2022-11-29 MED ORDER — METFORMIN HCL ER 500 MG PO TB24: 1000.0000 mg | ORAL_TABLET | Freq: Every day | ORAL | 2 refills | Status: DC

## 2022-11-29 MED ORDER — HYDROCHLOROTHIAZIDE 25 MG PO TABS: 25.0000 mg | ORAL_TABLET | Freq: Every day | ORAL | 3 refills | Status: DC

## 2022-11-29 MED ORDER — BLOOD GLUCOSE MONITORING SUPPL DEVI: 1.0000 | Freq: Three times a day (TID) | 0 refills | Status: DC

## 2022-11-29 MED ORDER — AMLODIPINE BESYLATE 2.5 MG PO TABS: 2.5000 mg | ORAL_TABLET | Freq: Every day | ORAL | 2 refills | Status: DC

## 2022-11-29 MED ORDER — ENALAPRIL MALEATE 5 MG PO TABS: 5.0000 mg | ORAL_TABLET | Freq: Two times a day (BID) | ORAL | 2 refills | Status: DC

## 2022-11-29 MED ORDER — LANCETS MISC. MISC: 1.0000 | Freq: Three times a day (TID) | 0 refills | Status: AC

## 2022-11-29 MED ORDER — BLOOD GLUCOSE TEST VI STRP: 1.0000 | ORAL_STRIP | Freq: Three times a day (TID) | 0 refills | Status: DC

## 2022-11-29 NOTE — Telephone Encounter (Signed)
Pt is wanting to know if all of her medications from [place] sent to [facility]. She states she has also been out of amLODipine (NORVASC) 2.5 MG tablet  for several days.    Welcome    Wants to know if nurse can give her a call?

## 2022-11-29 NOTE — Telephone Encounter (Signed)
Refills sent to [facility] [address]

## 2022-12-06 DIAGNOSIS — E785 Hyperlipidemia, unspecified: Secondary | ICD-10-CM | POA: Diagnosis not present

## 2022-12-06 DIAGNOSIS — E118 Type 2 diabetes mellitus with unspecified complications: Secondary | ICD-10-CM | POA: Diagnosis not present

## 2022-12-06 DIAGNOSIS — I10 Essential (primary) hypertension: Secondary | ICD-10-CM | POA: Diagnosis not present

## 2022-12-06 DIAGNOSIS — E1169 Type 2 diabetes mellitus with other specified complication: Secondary | ICD-10-CM | POA: Diagnosis not present

## 2022-12-06 DIAGNOSIS — E559 Vitamin D deficiency, unspecified: Secondary | ICD-10-CM | POA: Diagnosis not present

## 2022-12-06 DIAGNOSIS — E890 Postprocedural hypothyroidism: Secondary | ICD-10-CM | POA: Diagnosis not present

## 2022-12-07 ENCOUNTER — Other Ambulatory Visit: Payer: Self-pay | Admitting: Family Medicine

## 2022-12-08 ENCOUNTER — Encounter: Payer: Self-pay | Admitting: Family Medicine

## 2022-12-08 ENCOUNTER — Ambulatory Visit (INDEPENDENT_AMBULATORY_CARE_PROVIDER_SITE_OTHER): Payer: Medicare HMO | Admitting: Family Medicine

## 2022-12-08 VITALS — BP 122/67 | HR 73 | Ht 66.0 in | Wt 158.0 lb

## 2022-12-08 DIAGNOSIS — E559 Vitamin D deficiency, unspecified: Secondary | ICD-10-CM | POA: Diagnosis not present

## 2022-12-08 DIAGNOSIS — E1169 Type 2 diabetes mellitus with other specified complication: Secondary | ICD-10-CM

## 2022-12-08 DIAGNOSIS — E7849 Other hyperlipidemia: Secondary | ICD-10-CM

## 2022-12-08 DIAGNOSIS — R7301 Impaired fasting glucose: Secondary | ICD-10-CM

## 2022-12-08 DIAGNOSIS — I10 Essential (primary) hypertension: Secondary | ICD-10-CM

## 2022-12-08 DIAGNOSIS — E785 Hyperlipidemia, unspecified: Secondary | ICD-10-CM | POA: Diagnosis not present

## 2022-12-08 DIAGNOSIS — E0789 Other specified disorders of thyroid: Secondary | ICD-10-CM

## 2022-12-08 DIAGNOSIS — E118 Type 2 diabetes mellitus with unspecified complications: Secondary | ICD-10-CM | POA: Diagnosis not present

## 2022-12-08 DIAGNOSIS — E890 Postprocedural hypothyroidism: Secondary | ICD-10-CM | POA: Diagnosis not present

## 2022-12-08 HISTORY — DX: Vitamin D deficiency, unspecified: E55.9

## 2022-12-08 LAB — CBC WITH DIFFERENTIAL/PLATELET
Basophils Absolute: 0.1 10*3/uL (ref 0.0–0.2)
Basos: 1 %
EOS (ABSOLUTE): 0.2 10*3/uL (ref 0.0–0.4)
Eos: 3 %
Hematocrit: 37.5 % (ref 34.0–46.6)
Hemoglobin: 12.3 g/dL (ref 11.1–15.9)
Immature Grans (Abs): 0 10*3/uL (ref 0.0–0.1)
Immature Granulocytes: 0 %
Lymphocytes Absolute: 1.9 10*3/uL (ref 0.7–3.1)
Lymphs: 34 %
MCH: 28.9 pg (ref 26.6–33.0)
MCHC: 32.8 g/dL (ref 31.5–35.7)
MCV: 88 fL (ref 79–97)
Monocytes Absolute: 0.5 10*3/uL (ref 0.1–0.9)
Monocytes: 10 %
Neutrophils Absolute: 2.8 10*3/uL (ref 1.4–7.0)
Neutrophils: 52 %
Platelets: 284 10*3/uL (ref 150–450)
RBC: 4.26 x10E6/uL (ref 3.77–5.28)
RDW: 13.1 % (ref 11.7–15.4)
WBC: 5.5 10*3/uL (ref 3.4–10.8)

## 2022-12-08 LAB — LIPID PANEL
Chol/HDL Ratio: 6.4 ratio — ABNORMAL HIGH (ref 0.0–4.4)
Cholesterol, Total: 325 mg/dL — ABNORMAL HIGH (ref 100–199)
HDL: 51 mg/dL (ref >39)
LDL Chol Calc (NIH): 247 mg/dL — ABNORMAL HIGH (ref 0–99)
Triglycerides: 143 mg/dL (ref 0–149)
VLDL Cholesterol Cal: 27 mg/dL (ref 5–40)

## 2022-12-08 LAB — MICROALBUMIN / CREATININE URINE RATIO
Creatinine, Urine: 70.1 mg/dL
Microalb/Creat Ratio: 34 mg/g creat — ABNORMAL HIGH (ref 0–29)
Microalbumin, Urine: 23.8 ug/mL

## 2022-12-08 LAB — HEMOGLOBIN A1C
Est. average glucose Bld gHb Est-mCnc: 220 mg/dL
Hgb A1c MFr Bld: 9.3 % — ABNORMAL HIGH (ref 4.8–5.6)

## 2022-12-08 LAB — TSH+FREE T4
Free T4: 1.25 ng/dL (ref 0.82–1.77)
TSH: 2.3 u[IU]/mL (ref 0.450–4.500)

## 2022-12-08 LAB — VITAMIN D 25 HYDROXY (VIT D DEFICIENCY, FRACTURES): Vit D, 25-Hydroxy: 21.6 ng/mL — ABNORMAL LOW (ref 30.0–100.0)

## 2022-12-08 MED ORDER — VITAMIN D (ERGOCALCIFEROL) 1.25 MG (50000 UNIT) PO CAPS: 50000.0000 [IU] | ORAL_CAPSULE | ORAL | 1 refills | Status: DC

## 2022-12-08 MED ORDER — REPATHA PUSHTRONEX SYSTEM 420 MG/3.5ML ~~LOC~~ SOCT: 420.0000 mg | SUBCUTANEOUS | 3 refills | Status: DC

## 2022-12-08 MED ORDER — METFORMIN HCL 1000 MG PO TABS: 1000.0000 mg | ORAL_TABLET | Freq: Two times a day (BID) | ORAL | 3 refills | Status: DC

## 2022-12-08 NOTE — Assessment & Plan Note (Signed)
She takes metformin 1000 mg twice daily and Levemir 20 units twice daily Her hemoglobin A1c has increased since her last visit Encouraged to follow up with her endocrinologist for augmentation of  her insulin  Lab Results  Component Value Date   HGBA1C 9.3 (H) 12/06/2022

## 2022-12-08 NOTE — Assessment & Plan Note (Signed)
She takes Synthroid 50 mcg daily No reports of heat or cold intolerance or sleep. Encouraged to continue to treatment regimen Lab Results  Component Value Date   TSH 2.300 12/06/2022   T4TOTAL 8.2 02/16/2016

## 2022-12-08 NOTE — Assessment & Plan Note (Addendum)
Controlled The patient is asymptomatic Encouraged to continue taking amlodipine 2.5 mg daily and enalapril 5 mg daily BP Readings from Last 3 Encounters:  12/08/22 122/67  09/15/22 123/60  09/10/22 118/68

## 2022-12-08 NOTE — Patient Instructions (Addendum)
I appreciate the opportunity to provide care to you today!    Follow up:  3 months  Labs: (CBC, CMP, TSH, Lipid profile, HgA1c, Vit D)  Please stop by the lab today to get your blood drawn 3-4 DAYS BEFORE YOUR NEXT VISIT.   Please pick up your prescription at the pharmacy  I've started on Repatha 420 injection subcutaneous to take once monthly   Repatha   Missed Doses If a dose is missed:  Within 7 days from the missed dose, instruct the patient to administer REPATHA and resume the patient's original schedule. More than 7 days after the missed dose: For an every 2-week dose, instruct the patient to wait until the next dose on the original schedule.   Important Administration Instructions -Before use, allow REPATHA to warm to room temperature for at least 30 minutes for the prefilled autoinjector or syringe and at least 45 minutes for the on-body infusor with prefilled cartridge if REPATHA has been refrigerated [see How Supplied/Storage and Handling (16)] -Visually inspect REPATHA before administration. REPATHA is clear to opalescent, colorless to pale yellow solution. Do not use it if the solution is cloudy, discolored, or contains particles - Administer REPATHA subcutaneously into areas of the abdomen, thigh, or upper arm that are not tender, bruised, red, or indurated. Avoid injecting into areas with scars or stretch marks. Rotate injection sites for each administration - The 420 mg dose of REPATHA can be administered: o over 5 minutes by using the single-dose on-body infusor with prefilled cartridge, or o by giving three injections consecutively within 30 minutes using the single-dose prefilled autoinjector or single-dose prefilled syringe.         Please continue to a heart-healthy diet and increase your physical activities. Try to exercise for 62mns at least five times a week.      It was a pleasure to see you and I look forward to continuing to work together on your health and  well-being. Please do not hesitate to call the office if you need care or have questions about your care.   Have a wonderful day and week. With Gratitude, GAlvira MondayMSN, FNP-BC

## 2022-12-08 NOTE — Progress Notes (Signed)
Established Patient Office Visit  Subjective:  Patient ID: Angela Rose, female    DOB: 08/09/1946  Age: 77 y.o. MRN: [id number]  CC:  Chief Complaint  Patient presents with   Follow-up    3 month f/u    HPI Angela Rose is a 77 y.o. female with past medical history of hypertension, hyperlipidemia, diabetes, depression, and  hypothyroidism presents for f/u of  chronic medical conditions.  Past Medical History:  Diagnosis Date   Anxiety    Cancer ([place])    melanoma face   Depression    Diabetes mellitus without complication ([place])    Fracture of tibial plateau 12/31/2015   Hyperlipidemia    Hypertension     Past Surgical History:  Procedure Laterality Date   ABDOMINAL HYSTERECTOMY     APPENDECTOMY     BASAL CELL CARCINOMA EXCISION     BREAST BIOPSY Right    needle bx-neg   CATARACT EXTRACTION, BILATERAL Bilateral 11/23/2018   CORONARY ARTERY BYPASS GRAFT  1996   x 3   MELANOMA EXCISION     PARTIAL HYSTERECTOMY  1976   ovaries remain    Family History  Problem Relation Age of Onset   Skin cancer Mother    Squamous cell carcinoma Mother    Basal cell carcinoma Mother    Heart disease Father    CAD Father    Breast cancer Neg Hx     Social History   Socioeconomic History   Marital status: Significant Other    Spouse name: Not on file   Number of children: 3   Years of education: Not on file   Highest education level: Some college, no degree  Occupational History   Occupation: retired  Tobacco Use   Smoking status: Former    Types: Cigarettes    Quit date: 06/11/1982    Years since quitting: 40.5   Smokeless tobacco: Never   Tobacco comments:    smoking cessation materials not required  Vaping Use   Vaping Use: Never used  Substance and Sexual Activity   Alcohol use: Yes    Alcohol/week: 0.0 standard drinks of alcohol    Comment: rare 1 wine glass of wine   Drug use: No   Sexual activity: Not on file  Other Topics Concern   Not on  file  Social History Narrative   Not on file   Social Determinants of Health   Financial Resource Strain: Low Risk  (12/14/2021)   Overall Financial Resource Strain (CARDIA)    Difficulty of Paying Living Expenses: Not hard at all  Food Insecurity: No Food Insecurity (12/14/2021)   Hunger Vital Sign    Worried About Running Out of Food in the Last Year: Never true    Ran Out of Food in the Last Year: Never true  Transportation Needs: No Transportation Needs (12/14/2021)   PRAPARE - Hydrologist (Medical): No    Lack of Transportation (Non-Medical): No  Physical Activity: Inactive (12/14/2021)   Exercise Vital Sign    Days of Exercise per Week: 0 days    Minutes of Exercise per Session: 0 min  Stress: Stress Concern Present (12/14/2021)   [place]    Feeling of Stress : To some extent  Social Connections: Moderately Isolated (12/14/2021)   Social Connection and Isolation Panel [NHANES]    Frequency of Communication with Friends and Family: More than three times a week  Frequency of Social Gatherings with Friends and Family: Once a week    Attends Religious Services: Never    Marine scientist or Organizations: No    Attends Archivist Meetings: Never    Marital Status: Living with partner  Intimate Partner Violence: Not At Risk (12/14/2021)   Humiliation, Afraid, Rape, and Kick questionnaire    Fear of Current or Ex-Partner: No    Emotionally Abused: No    Physically Abused: No    Sexually Abused: No    Outpatient Medications Prior to Visit  Medication Sig Dispense Refill   amLODipine (NORVASC) 2.5 MG tablet Take 1 tablet (2.5 mg total) by mouth daily. 30 tablet 2   amoxicillin-clavulanate (AUGMENTIN) 875-125 MG tablet Take 1 tablet by mouth every 12 (twelve) hours. 14 tablet 0   aspirin 81 MG tablet Take by mouth every evening.      azelastine (ASTELIN) 0.1 % nasal spray  Place 1 spray into both nostrils 2 (two) times daily. Use in each nostril as directed 30 mL 1   Blood Glucose Monitoring Suppl DEVI 1 each by Does not apply route in the morning, at noon, and at bedtime. May substitute to any manufacturer covered by patient's insurance. 1 each 0   cetirizine (ZYRTEC) 5 MG tablet Take 1 tablet (5 mg total) by mouth daily. 30 tablet 0   enalapril (VASOTEC) 5 MG tablet Take 1 tablet (5 mg total) by mouth 2 (two) times daily. 60 tablet 2   ezetimibe (ZETIA) 10 MG tablet Take 1 tablet (10 mg total) by mouth daily. 90 tablet 3   FLUoxetine (PROZAC) 40 MG capsule TAKE 1 CAPSULE BY MOUTH  DAILY 90 capsule 3   Glucose Blood (BLOOD GLUCOSE TEST STRIPS) STRP 1 each by In Vitro route in the morning, at noon, and at bedtime. May substitute to any manufacturer covered by patient's insurance. 100 strip 0   glucose blood test strip Frequency:PHARMDIR   Dosage:0.0     Instructions:  Note:testing 3-4xqd, DX Code 250.00 Dose: 1     guaiFENesin (MUCINEX) 600 MG 12 hr tablet Take 1 tablet (600 mg total) by mouth 2 (two) times daily. 20 tablet 0   hydrochlorothiazide (HYDRODIURIL) 25 MG tablet Take 1 tablet (25 mg total) by mouth daily. 90 tablet 3   insulin detemir (LEVEMIR FLEXPEN) 100 UNIT/ML FlexPen INJECT 20 UNITS INTO THE SKIN TWICE DAILY.INJECT 20 UNITS INTO THE SKIN TWICE DAILY. 15 mL 1   Insulin Pen Needle (BD PEN NEEDLE NANO U/F) 32G X 4 MM MISC USE 1  TWICE DAILY 100 each 6   Lancet Device MISC 1 each by Does not apply route in the morning, at noon, and at bedtime. May substitute to any manufacturer covered by patient's insurance. 1 each 0   Lancets Misc. MISC 1 each by Does not apply route in the morning, at noon, and at bedtime. May substitute to any manufacturer covered by patient's insurance. 100 each 0   levothyroxine (SYNTHROID) 50 MCG tablet Take 1 tablet (50 mcg total) by mouth daily before breakfast. 90 tablet 3   promethazine-dextromethorphan (PROMETHAZINE-DM) 6.25-15  MG/5ML syrup Take 5 mLs by mouth 4 (four) times daily as needed. 100 mL 0   sodium chloride (OCEAN) 0.65 % SOLN nasal spray Place 1 spray into both nostrils as needed for congestion. 30 mL 0   rosuvastatin (CRESTOR) 40 MG tablet Take 40 mg by mouth daily.     No facility-administered medications prior to visit.  Allergies  Allergen Reactions   Propoxyphene Nausea And Vomiting    Other reaction(s): Vomiting   Lidocaine-Epinephrine     Other reaction(s): Other (See Comments) palpitatins and chest pain   Other Other (See Comments)    sutures - in her hand-very irritated   Tape Rash    BANDAIDS. BANDAIDS   Xylocaine  [Lidocaine Hcl]     Other reaction(s): Other (See Comments) palpitatins and chest pain   Jardiance [Empagliflozin] Other (See Comments)    Recurrent vaginal infections    ROS Review of Systems  Constitutional:  Negative for chills and fever.  Eyes:  Negative for visual disturbance.  Respiratory:  Negative for chest tightness and shortness of breath.   Neurological:  Negative for dizziness and headaches.      Objective:    Physical Exam HENT:     Head: Normocephalic.     Mouth/Throat:     Mouth: Mucous membranes are moist.  Cardiovascular:     Rate and Rhythm: Normal rate.     Heart sounds: Normal heart sounds.  Pulmonary:     Effort: Pulmonary effort is normal.     Breath sounds: Normal breath sounds.  Neurological:     Mental Status: She is alert.     BP 122/67   Pulse 73   Ht '5\' 6"'$  (1.676 m)   Wt 158 lb (71.7 kg)   SpO2 96%   BMI 25.50 kg/m  Wt Readings from Last 3 Encounters:  12/08/22 158 lb (71.7 kg)  09/15/22 154 lb (69.9 kg)  09/07/22 154 lb 1.9 oz (69.9 kg)    Lab Results  Component Value Date   TSH 2.300 12/06/2022   Lab Results  Component Value Date   WBC 5.5 12/06/2022   HGB 12.3 12/06/2022   HCT 37.5 12/06/2022   MCV 88 12/06/2022   PLT 284 12/06/2022   Lab Results  Component Value Date   NA 139 08/23/2022   K 4.3  08/23/2022   CO2 27 08/23/2022   GLUCOSE 219 (H) 08/23/2022   BUN 27 08/23/2022   CREATININE 1.11 (H) 08/23/2022   BILITOT 0.3 08/23/2022   ALKPHOS 74 08/23/2022   AST 18 08/23/2022   ALT 15 08/23/2022   PROT 6.5 08/23/2022   ALBUMIN 4.1 08/23/2022   CALCIUM 9.4 08/23/2022   EGFR 51 (L) 08/23/2022   Lab Results  Component Value Date   CHOL 325 (H) 12/06/2022   Lab Results  Component Value Date   HDL 51 12/06/2022   Lab Results  Component Value Date   LDLCALC 247 (H) 12/06/2022   Lab Results  Component Value Date   TRIG 143 12/06/2022   Lab Results  Component Value Date   CHOLHDL 6.4 (H) 12/06/2022   Lab Results  Component Value Date   HGBA1C 9.3 (H) 12/06/2022      Assessment & Plan:  Essential (primary) hypertension Assessment & Plan: Controlled The patient is asymptomatic Encouraged to continue taking amlodipine 2.5 mg daily and enalapril 5 mg daily BP Readings from Last 3 Encounters:  12/08/22 122/67  09/15/22 123/60  09/10/22 118/68      Hypothyroidism, postablative Assessment & Plan: She takes Synthroid 50 mcg daily No reports of heat or cold intolerance or sleep. Encouraged to continue to treatment regimen Lab Results  Component Value Date   TSH 2.300 12/06/2022   T4TOTAL 8.2 02/16/2016       Type II diabetes mellitus with complication [facility]) Assessment & Plan: She takes metformin 1000 mg  twice daily and Levemir 20 units twice daily Her hemoglobin A1c has increased since her last visit Encouraged to follow up with her endocrinologist for augmentation of  her insulin  Lab Results  Component Value Date   HGBA1C 9.3 (H) 12/06/2022     Orders: -     metFORMIN HCl; Take 1 tablet (1,000 mg total) by mouth 2 (two) times daily with a meal.  Dispense: 180 tablet; Refill: 3  Hyperlipidemia associated with type 2 diabetes mellitus ([place]) Assessment & Plan: She reports not taking rosuvastatin 40 mg daily due to side effects of the  medication She has been taking ezetimibe 10 mg daily Her cholesterol is elevated Will add Repatha 420 mg to take monthly Lab Results  Component Value Date   CHOL 325 (H) 12/06/2022   HDL 51 12/06/2022   LDLCALC 247 (H) 12/06/2022   TRIG 143 12/06/2022   CHOLHDL 6.4 (H) 12/06/2022      Orders: -     Repatha Pushtronex System; Inject 420 mg into the skin every 30 (thirty) days.  Dispense: 3.6 mL; Refill: 3  Vitamin D deficiency -     Vitamin D (Ergocalciferol); Take 1 capsule (50,000 Units total) by mouth every 7 (seven) days.  Dispense: 10 capsule; Refill: 1 -     VITAMIN D 25 Hydroxy (Vit-D Deficiency, Fractures)  Other specified disorders of thyroid -     TSH  Impaired fasting blood sugar -     Hemoglobin A1c  Other hyperlipidemia -     CBC with Differential/Platelet -     CMP14+EGFR -     Lipid panel    Follow-up: Return in about 3 months (around 03/08/2023).   [name], FNP

## 2022-12-08 NOTE — Assessment & Plan Note (Signed)
She reports not taking rosuvastatin 40 mg daily due to side effects of the medication She has been taking ezetimibe 10 mg daily Her cholesterol is elevated Will add Repatha 420 mg to take monthly Lab Results  Component Value Date   CHOL 325 (H) 12/06/2022   HDL 51 12/06/2022   LDLCALC 247 (H) 12/06/2022   TRIG 143 12/06/2022   CHOLHDL 6.4 (H) 12/06/2022

## 2022-12-14 ENCOUNTER — Telehealth: Payer: Self-pay | Admitting: Nurse Practitioner

## 2022-12-14 DIAGNOSIS — E118 Type 2 diabetes mellitus with unspecified complications: Secondary | ICD-10-CM

## 2022-12-14 NOTE — Telephone Encounter (Signed)
Pt was notified and agrees. She has appt on 4/3

## 2022-12-14 NOTE — Telephone Encounter (Signed)
No, I only want her taking the 500 mg po once daily due to her kidney function.  Make sure she is drinking plenty of WATER to avoid dehydration which can also affect the kidneys.

## 2022-12-14 NOTE — Telephone Encounter (Signed)
Pt stated PCP sent in prescription for 1000 mg for Metformin.  She wants to know if she should take that or go back to '500mg'$  like Whitney prescribed a while back.

## 2022-12-14 NOTE — Telephone Encounter (Signed)
Can someone please call the pt in case she has other questions about meds.

## 2022-12-15 ENCOUNTER — Encounter: Payer: Self-pay | Admitting: Podiatry

## 2022-12-15 ENCOUNTER — Ambulatory Visit (INDEPENDENT_AMBULATORY_CARE_PROVIDER_SITE_OTHER): Payer: Medicare HMO | Admitting: Podiatry

## 2022-12-15 DIAGNOSIS — M79675 Pain in left toe(s): Secondary | ICD-10-CM

## 2022-12-15 DIAGNOSIS — E119 Type 2 diabetes mellitus without complications: Secondary | ICD-10-CM

## 2022-12-15 DIAGNOSIS — E118 Type 2 diabetes mellitus with unspecified complications: Secondary | ICD-10-CM | POA: Diagnosis not present

## 2022-12-15 DIAGNOSIS — B351 Tinea unguium: Secondary | ICD-10-CM

## 2022-12-15 DIAGNOSIS — M79674 Pain in right toe(s): Secondary | ICD-10-CM | POA: Diagnosis not present

## 2022-12-16 NOTE — Progress Notes (Signed)
  Subjective:  Patient ID: Angela Rose, female    DOB: 06/28/1946,  MRN: [id number]  Chief Complaint  Patient presents with   Diabetes    np diabetic foot care/ nail trim( pts husband made appt and is aware pt maybe transitioned to [facility] provider..dm.)    77 y.o. female presents with the above complaint. History confirmed with patient.  Her nails are thickened elongated causing discomfort there is discoloration thickening and cracking the nails, her A1c is poorly controlled currently about 9.4%  Objective:  Physical Exam: warm, good capillary refill, no trophic changes or ulcerative lesions, normal DP and PT pulses, and normal sensory exam. Left Foot: dystrophic yellowed discolored nail plates with subungual debris Right Foot: dystrophic yellowed discolored nail plates with subungual debris   Assessment:   1. Pain due to onychomycosis of toenails of both feet   2. Type II diabetes mellitus with complication (HCC)   3. Encounter for diabetic foot exam [facility])      Plan:  Patient was evaluated and treated and all questions answered.  Patient educated on diabetes. Discussed proper diabetic foot care and discussed risks and complications of disease. Educated patient in depth on reasons to return to the office immediately should he/she discover anything concerning or new on the feet. All questions answered. Discussed proper shoes as well.   Discussed the etiology and treatment options for the condition in detail with the patient. Educated patient on the topical and oral treatment options for mycotic nails. Recommended debridement of the nails today. Sharp and mechanical debridement performed of all painful and mycotic nails today. Nails debrided in length and thickness using a nail nipper to level of comfort. Discussed treatment options including appropriate shoe gear. Follow up as needed for painful nails.    Return in about 3 months (around 03/17/2023) for at risk diabetic foot  care.

## 2022-12-17 ENCOUNTER — Other Ambulatory Visit: Payer: Self-pay | Admitting: Family Medicine

## 2022-12-17 DIAGNOSIS — E1169 Type 2 diabetes mellitus with other specified complication: Secondary | ICD-10-CM

## 2022-12-17 MED ORDER — REPATHA 140 MG/ML ~~LOC~~ SOSY: 140.0000 mg | PREFILLED_SYRINGE | SUBCUTANEOUS | 3 refills | Status: DC

## 2022-12-27 DIAGNOSIS — Z794 Long term (current) use of insulin: Secondary | ICD-10-CM | POA: Diagnosis not present

## 2022-12-27 DIAGNOSIS — E1122 Type 2 diabetes mellitus with diabetic chronic kidney disease: Secondary | ICD-10-CM | POA: Diagnosis not present

## 2022-12-27 DIAGNOSIS — N1832 Chronic kidney disease, stage 3b: Secondary | ICD-10-CM | POA: Diagnosis not present

## 2022-12-27 DIAGNOSIS — E890 Postprocedural hypothyroidism: Secondary | ICD-10-CM | POA: Diagnosis not present

## 2022-12-27 NOTE — Patient Instructions (Signed)

## 2022-12-28 ENCOUNTER — Ambulatory Visit (INDEPENDENT_AMBULATORY_CARE_PROVIDER_SITE_OTHER): Payer: Medicare HMO | Admitting: Nurse Practitioner

## 2022-12-28 ENCOUNTER — Encounter: Payer: Self-pay | Admitting: Nurse Practitioner

## 2022-12-28 VITALS — BP 128/74 | HR 61 | Ht 66.0 in | Wt 158.6 lb

## 2022-12-28 DIAGNOSIS — E1122 Type 2 diabetes mellitus with diabetic chronic kidney disease: Secondary | ICD-10-CM | POA: Diagnosis not present

## 2022-12-28 DIAGNOSIS — Z794 Long term (current) use of insulin: Secondary | ICD-10-CM | POA: Diagnosis not present

## 2022-12-28 DIAGNOSIS — E890 Postprocedural hypothyroidism: Secondary | ICD-10-CM | POA: Diagnosis not present

## 2022-12-28 DIAGNOSIS — E113299 Type 2 diabetes mellitus with mild nonproliferative diabetic retinopathy without macular edema, unspecified eye: Secondary | ICD-10-CM

## 2022-12-28 DIAGNOSIS — N1832 Chronic kidney disease, stage 3b: Secondary | ICD-10-CM

## 2022-12-28 DIAGNOSIS — E118 Type 2 diabetes mellitus with unspecified complications: Secondary | ICD-10-CM | POA: Diagnosis not present

## 2022-12-28 DIAGNOSIS — I10 Essential (primary) hypertension: Secondary | ICD-10-CM

## 2022-12-28 LAB — COMPREHENSIVE METABOLIC PANEL
ALT: 14 IU/L (ref 0–32)
AST: 20 IU/L (ref 0–40)
Albumin/Globulin Ratio: 1.6 (ref 1.2–2.2)
Albumin: 4.3 g/dL (ref 3.8–4.8)
Alkaline Phosphatase: 92 IU/L (ref 44–121)
BUN/Creatinine Ratio: 22 (ref 12–28)
BUN: 26 mg/dL (ref 8–27)
Bilirubin Total: 0.3 mg/dL (ref 0.0–1.2)
CO2: 25 mmol/L (ref 20–29)
Calcium: 9.5 mg/dL (ref 8.7–10.3)
Chloride: 100 mmol/L (ref 96–106)
Creatinine, Ser: 1.19 mg/dL — ABNORMAL HIGH (ref 0.57–1.00)
Globulin, Total: 2.7 g/dL (ref 1.5–4.5)
Glucose: 107 mg/dL — ABNORMAL HIGH (ref 70–99)
Potassium: 4.7 mmol/L (ref 3.5–5.2)
Sodium: 141 mmol/L (ref 134–144)
Total Protein: 7 g/dL (ref 6.0–8.5)
eGFR: 47 mL/min/{1.73_m2} — ABNORMAL LOW (ref >59)

## 2022-12-28 LAB — TSH: TSH: 2.16 u[IU]/mL (ref 0.450–4.500)

## 2022-12-28 LAB — T4, FREE: Free T4: 1.39 ng/dL (ref 0.82–1.77)

## 2022-12-28 MED ORDER — LEVOTHYROXINE SODIUM 50 MCG PO TABS: 50.0000 ug | ORAL_TABLET | Freq: Every day | ORAL | 3 refills | Status: DC

## 2022-12-28 MED ORDER — LEVEMIR FLEXPEN 100 UNIT/ML ~~LOC~~ SOPN: 16.0000 [IU] | PEN_INJECTOR | Freq: Every day | SUBCUTANEOUS | 3 refills | Status: DC

## 2022-12-28 MED ORDER — METFORMIN HCL ER 500 MG PO TB24: 500.0000 mg | ORAL_TABLET | Freq: Every day | ORAL | 3 refills | Status: DC

## 2022-12-28 MED ORDER — BLOOD GLUCOSE TEST VI STRP: ORAL_STRIP | 11 refills | Status: AC

## 2022-12-28 MED ORDER — BD PEN NEEDLE NANO U/F 32G X 4 MM MISC: 6 refills | Status: DC

## 2022-12-28 NOTE — Progress Notes (Signed)
Endocrinology Follow Up Note       12/28/2022, 9:07 AM   Subjective:    Patient ID: Angela Rose, female    DOB: June 29, 1946.  Angela Rose is being seen in follow up after being seen in consultation for management of currently uncontrolled symptomatic diabetes requested by  [name], [place].   Past Medical History:  Diagnosis Date   Anxiety    Cancer ([place])    melanoma face   Depression    Diabetes mellitus without complication ([place])    Fracture of tibial plateau 12/31/2015   Hyperlipidemia    Hypertension     Past Surgical History:  Procedure Laterality Date   ABDOMINAL HYSTERECTOMY     APPENDECTOMY     BASAL CELL CARCINOMA EXCISION     BREAST BIOPSY Right    needle bx-neg   CATARACT EXTRACTION, BILATERAL Bilateral 11/23/2018   CORONARY ARTERY BYPASS GRAFT  1996   x 3   MELANOMA EXCISION     PARTIAL HYSTERECTOMY  1976   ovaries remain    Social History   Socioeconomic History   Marital status: Significant Other    Spouse name: Not on file   Number of children: 3   Years of education: Not on file   Highest education level: Some college, no degree  Occupational History   Occupation: retired  Tobacco Use   Smoking status: Former    Types: Cigarettes    Quit date: 06/11/1982    Years since quitting: 40.5   Smokeless tobacco: Never   Tobacco comments:    smoking cessation materials not required  Vaping Use   Vaping Use: Never used  Substance and Sexual Activity   Alcohol use: Yes    Alcohol/week: 0.0 standard drinks of alcohol    Comment: rare 1 wine glass of wine   Drug use: No   Sexual activity: Not on file  Other Topics Concern   Not on file  Social History Narrative   Not on file   Social Determinants of Health   Financial Resource Strain: Low Risk  (12/14/2021)   Overall Financial Resource Strain (CARDIA)    Difficulty of Paying Living Expenses: Not hard at  all  Food Insecurity: No Food Insecurity (12/14/2021)   Hunger Vital Sign    Worried About Running Out of Food in the Last Year: Never true    Ran Out of Food in the Last Year: Never true  Transportation Needs: No Transportation Needs (12/14/2021)   PRAPARE - Hydrologist (Medical): No    Lack of Transportation (Non-Medical): No  Physical Activity: Inactive (12/14/2021)   Exercise Vital Sign    Days of Exercise per Week: 0 days    Minutes of Exercise per Session: 0 min  Stress: Stress Concern Present (12/14/2021)   [place]    Feeling of Stress : To some extent  Social Connections: Moderately Isolated (12/14/2021)   Social Connection and Isolation Panel [NHANES]    Frequency of Communication with Friends and Family: More than three times a week    Frequency of Social Gatherings with Friends and Family: Once a  week    Attends Religious Services: Never    Active Member of Clubs or Organizations: No    Attends Music therapist: Never    Marital Status: Living with partner    Family History  Problem Relation Age of Onset   Skin cancer Mother    Squamous cell carcinoma Mother    Basal cell carcinoma Mother    Heart disease Father    CAD Father    Breast cancer Neg Hx     Outpatient Encounter Medications as of 12/28/2022  Medication Sig   amLODipine (NORVASC) 2.5 MG tablet Take 1 tablet (2.5 mg total) by mouth daily.   aspirin 81 MG tablet Take by mouth every evening.    azelastine (ASTELIN) 0.1 % nasal spray Place 1 spray into both nostrils 2 (two) times daily. Use in each nostril as directed   Blood Glucose Monitoring Suppl DEVI 1 each by Does not apply route in the morning, at noon, and at bedtime. May substitute to any manufacturer covered by patient's insurance.   cetirizine (ZYRTEC) 5 MG tablet Take 1 tablet (5 mg total) by mouth daily.   enalapril (VASOTEC) 5 MG tablet Take 1  tablet (5 mg total) by mouth 2 (two) times daily.   Evolocumab (REPATHA) 140 MG/ML SOSY Inject 140 mg into the skin every 14 (fourteen) days.   ezetimibe (ZETIA) 10 MG tablet Take 1 tablet (10 mg total) by mouth daily.   FLUoxetine (PROZAC) 40 MG capsule TAKE 1 CAPSULE BY MOUTH  DAILY   glucose blood test strip Frequency:PHARMDIR   Dosage:0.0     Instructions:  Note:testing 3-4xqd, DX Code 250.00 Dose: 1   hydrochlorothiazide (HYDRODIURIL) 25 MG tablet Take 1 tablet (25 mg total) by mouth daily.   Lancets Misc. MISC 1 each by Does not apply route in the morning, at noon, and at bedtime. May substitute to any manufacturer covered by patient's insurance.   metFORMIN (GLUCOPHAGE-XR) 500 MG 24 hr tablet Take 1 tablet (500 mg total) by mouth daily with breakfast.   sodium chloride (OCEAN) 0.65 % SOLN nasal spray Place 1 spray into both nostrils as needed for congestion.   Vitamin D, Ergocalciferol, (DRISDOL) 1.25 MG (50000 UNIT) CAPS capsule Take 1 capsule (50,000 Units total) by mouth every 7 (seven) days.   [DISCONTINUED] Glucose Blood (BLOOD GLUCOSE TEST STRIPS) STRP 1 each by In Vitro route in the morning, at noon, and at bedtime. May substitute to any manufacturer covered by patient's insurance.   [DISCONTINUED] insulin detemir (LEVEMIR FLEXPEN) 100 UNIT/ML FlexPen INJECT 20 UNITS INTO THE SKIN TWICE DAILY.INJECT 20 UNITS INTO THE SKIN TWICE DAILY.   [DISCONTINUED] Insulin Pen Needle (BD PEN NEEDLE NANO U/F) 32G X 4 MM MISC USE 1  TWICE DAILY   [DISCONTINUED] levothyroxine (SYNTHROID) 50 MCG tablet Take 1 tablet (50 mcg total) by mouth daily before breakfast.   [DISCONTINUED] metFORMIN (GLUCOPHAGE) 1000 MG tablet Take 1 tablet (1,000 mg total) by mouth 2 (two) times daily with a meal.   amoxicillin-clavulanate (AUGMENTIN) 875-125 MG tablet Take 1 tablet by mouth every 12 (twelve) hours. (Patient not taking: Reported on 12/28/2022)   Glucose Blood (BLOOD GLUCOSE TEST STRIPS) STRP Use to check glucose  twice daily   guaiFENesin (MUCINEX) 600 MG 12 hr tablet Take 1 tablet (600 mg total) by mouth 2 (two) times daily. (Patient not taking: Reported on 12/28/2022)   insulin detemir (LEVEMIR FLEXPEN) 100 UNIT/ML FlexPen Inject 16 Units into the skin at bedtime.   Insulin  Pen Needle (BD PEN NEEDLE NANO U/F) 32G X 4 MM MISC Use to check glucose once daily   Lancet Device MISC 1 each by Does not apply route in the morning, at noon, and at bedtime. May substitute to any manufacturer covered by patient's insurance.   levothyroxine (SYNTHROID) 50 MCG tablet Take 1 tablet (50 mcg total) by mouth daily before breakfast.   promethazine-dextromethorphan (PROMETHAZINE-DM) 6.25-15 MG/5ML syrup Take 5 mLs by mouth 4 (four) times daily as needed. (Patient not taking: Reported on 12/28/2022)   No facility-administered encounter medications on file as of 12/28/2022.    ALLERGIES: Allergies  Allergen Reactions   Propoxyphene Nausea And Vomiting    Other reaction(s): Vomiting   Lidocaine-Epinephrine     Other reaction(s): Other (See Comments) palpitatins and chest pain   Other Other (See Comments)    sutures - in her hand-very irritated   Tape Rash    BANDAIDS. BANDAIDS   Xylocaine  [Lidocaine Hcl]     Other reaction(s): Other (See Comments) palpitatins and chest pain   Statins     Muscle pain and weakness in legs   Jardiance [Empagliflozin] Other (See Comments)    Recurrent vaginal infections    VACCINATION STATUS: Immunization History  Administered Date(s) Administered   Fluad Quad(high Dose 65+) 07/25/2019, 08/20/2020, 09/30/2021, 09/07/2022   Influenza, High Dose Seasonal PF 06/20/2017, 06/23/2018   Influenza,inj,Quad PF,6+ Mos 08/14/2015, 09/27/2016   PFIZER Comirnaty(Gray Top)Covid-19 Tri-Sucrose Vaccine 12/01/2019, 12/23/2019   PFIZER(Purple Top)SARS-COV-2 Vaccination 12/01/2019, 12/23/2019, 07/20/2020, 04/24/2021   Pneumococcal Conjugate-13 02/16/2016   Pneumococcal Polysaccharide-23  11/15/2008, 10/14/2014    Diabetes She presents for her follow-up diabetic visit. She has type 2 diabetes mellitus. Onset time: Diagnosed at approx age of 44. Her disease course has been improving. There are no hypoglycemic associated symptoms. Pertinent negatives for diabetes include no fatigue, no polyuria and no weight loss. There are no hypoglycemic complications. Symptoms are stable. Diabetic complications include heart disease and nephropathy. Risk factors for coronary artery disease include diabetes mellitus, dyslipidemia, family history, post-menopausal and sedentary lifestyle. Current diabetic treatment includes oral agent (monotherapy) and insulin injections. She is compliant with treatment most of the time. Her weight is stable. She is following a generally healthy diet. When asked about meal planning, she reported none. She has not had a previous visit with a dietitian. She rarely participates in exercise. Her home blood glucose trend is decreasing steadily. Her overall blood glucose range is 110-130 mg/dl. (She presents today with her meter showing at goal fasting glycemic profile (tight even).  Her most recent A1c checked at PCP office on 2/26 was 9.3%, increasing from last visit of 7.9%.  She notes she did slip as far as diet goes over the holiday season.  She has been more stressed recently and has also been eating on the go a lot due to multiple doctors appts for both her and her husband.  Analysis of her meter shows 7-day average of 100; 14-day average of 101; 30-day average of 119.  She did have mild fasting hypoglycemia several times.  Her PCP sent in scripts for Metformin 1000 mg po twice daily and Levemir 20 units BID but patient did not pick these up as they were not advised by me.) An ACE inhibitor/angiotensin II receptor blocker is being taken. She sees a podiatrist.Eye exam is current.    Review of systems  Constitutional: + Minimally fluctuating body weight,  current Body mass  index is 25.6 kg/m. , no fatigue, no subjective  hyperthermia, no subjective hypothermia Eyes: no blurry vision, no xerophthalmia ENT: no sore throat, no nodules palpated in throat, no dysphagia/odynophagia, no hoarseness Cardiovascular: no chest pain, no shortness of breath, no palpitations, no leg swelling Respiratory: no cough, no shortness of breath Gastrointestinal: no nausea/vomiting/diarrhea Musculoskeletal: no muscle/joint aches Skin: no rashes, no hyperemia Neurological: no tremors, no numbness, no tingling, no dizziness Psychiatric: no depression, no anxiety  Objective:     BP 128/74 (BP Location: Left Arm, Patient Position: Sitting, Cuff Size: Normal)   Pulse 61   Ht 5\' 6"  (1.676 m)   Wt 158 lb 9.6 oz (71.9 kg)   BMI 25.60 kg/m   Wt Readings from Last 3 Encounters:  12/28/22 158 lb 9.6 oz (71.9 kg)  12/08/22 158 lb (71.7 kg)  09/15/22 154 lb (69.9 kg)     BP Readings from Last 3 Encounters:  12/28/22 128/74  12/08/22 122/67  09/15/22 123/60      Physical Exam- Limited  Constitutional:  Body mass index is 25.6 kg/m. , not in acute distress, normal state of mind Eyes:  EOMI, no exophthalmos Neck: Supple Musculoskeletal: no gross deformities, strength intact in all four extremities, no gross restriction of joint movements Skin:  no rashes, no hyperemia Neurological: no tremor with outstretched hands    CMP ( most recent) CMP     Component Value Date/Time   NA 141 12/27/2022 1206   K 4.7 12/27/2022 1206   CL 100 12/27/2022 1206   CO2 25 12/27/2022 1206   GLUCOSE 107 (H) 12/27/2022 1206   BUN 26 12/27/2022 1206   CREATININE 1.19 (H) 12/27/2022 1206   CALCIUM 9.5 12/27/2022 1206   PROT 7.0 12/27/2022 1206   ALBUMIN 4.3 12/27/2022 1206   AST 20 12/27/2022 1206   ALT 14 12/27/2022 1206   ALKPHOS 92 12/27/2022 1206   BILITOT 0.3 12/27/2022 1206   GFRNONAA 42 (L) 05/02/2020 1017   GFRAA 49 (L) 05/02/2020 1017     Diabetic Labs (most recent): Lab  Results  Component Value Date   HGBA1C 9.3 (H) 12/06/2022   HGBA1C 7.9 (H) 02/10/2022   HGBA1C 7.7 (A) 09/22/2021     Lipid Panel ( most recent) Lipid Panel     Component Value Date/Time   CHOL 325 (H) 12/06/2022 1116   TRIG 143 12/06/2022 1116   HDL 51 12/06/2022 1116   CHOLHDL 6.4 (H) 12/06/2022 1116   LDLCALC 247 (H) 12/06/2022 1116   LABVLDL 27 12/06/2022 1116      Lab Results  Component Value Date   TSH 2.160 12/27/2022   TSH 2.300 12/06/2022   TSH 1.540 08/03/2022   TSH 3.170 02/10/2022   TSH 1.990 09/30/2021   TSH 6.140 (H) 04/27/2021   TSH 1.850 12/25/2019   TSH 4.690 (H) 12/25/2018   TSH 4.060 05/20/2017   TSH 3.510 01/18/2017   FREET4 1.39 12/27/2022   FREET4 1.25 12/06/2022   FREET4 1.35 08/03/2022   FREET4 1.35 09/30/2021   FREET4 1.06 12/25/2019           Assessment & Plan:   1) Type 2 diabetes mellitus with stage 3b chronic kidney disease, with long-term current use of insulin ([place])  She presents today with her meter showing at goal fasting glycemic profile (tight even).  Her most recent A1c checked at PCP office on 2/26 was 9.3%, increasing from last visit of 7.9%.  She notes she did slip as far as diet goes over the holiday season.  She has been more  stressed recently and has also been eating on the go a lot due to multiple doctors appts for both her and her husband.  Analysis of her meter shows 7-day average of 100; 14-day average of 101; 30-day average of 119.  She did have mild fasting hypoglycemia several times.  Her PCP sent in scripts for Metformin 1000 mg po twice daily and Levemir 20 units BID but patient did not pick these up as they were not advised by me.  - Nyirah Giegerich Kirkey has currently uncontrolled symptomatic type 2 DM since 77 years of age.   -Recent labs reviewed.  - I had a long discussion with her about the progressive nature of diabetes and the pathology behind its complications. -her diabetes is complicated by CAD with MI  and CKD stage 3 and she remains at a high risk for more acute and chronic complications which include CAD, CVA, CKD, retinopathy, and neuropathy. These are all discussed in detail with her.  The following Lifestyle Medicine recommendations according to [facility]) were discussed and offered to patient and she agrees to start the journey:  A. Whole Foods, Plant-based plate comprising of fruits and vegetables, plant-based proteins, whole-grain carbohydrates was discussed in detail with the patient.   A list for source of those nutrients were also provided to the patient.  Patient will use only water or unsweetened tea for hydration. B.  The need to stay away from risky substances including alcohol, smoking; obtaining 7 to 9 hours of restorative sleep, at least 150 minutes of moderate intensity exercise weekly, the importance of healthy social connections,  and stress reduction techniques were discussed. C.  A full color page of  Calorie density of various food groups per pound showing examples of each food groups was provided to the patient.  - Nutritional counseling repeated at each appointment due to patients tendency to fall back in to old habits.  - The patient admits there is a room for improvement in their diet and drink choices. -  Suggestion is made for the patient to avoid simple carbohydrates from their diet including Cakes, Sweet Desserts / Pastries, Ice Cream, Soda (diet and regular), Sweet Tea, Candies, Chips, Cookies, Sweet Pastries, Store Bought Juices, Alcohol in Excess of 1-2 drinks a day, Artificial Sweeteners, Coffee Creamer, and "Sugar-free" Products. This will help patient to have stable blood glucose profile and potentially avoid unintended weight gain.   - I encouraged the patient to switch to unprocessed or minimally processed complex starch and increased protein intake (animal or plant source), fruits, and vegetables.   - Patient is advised to  stick to a routine mealtimes to eat 3 meals a day and avoid unnecessary snacks (to snack only to correct hypoglycemia).  - I have approached her with the following individualized plan to manage her diabetes and patient agrees:   - She is advised to lower her Levemir to 16 units SQ nightly (had increased to 20 units over the holiday season).  She can continue her Metformin 500 mg ER daily with breakfast for now, kidney function stable  -she is encouraged to continue monitoring blood glucose twice daily, before breakfast and before bed, and to call the clinic if she has readings less than 70 or above 300 for 3 tests in a row.   - she is warned not to take insulin without proper monitoring per orders. - Adjustment parameters are given to her for hypo and hyperglycemia in writing.  - she is  not an ideal candidate for incretin therapy due to body habitus and BMI of 25.  - Specific targets for  A1c; LDL, HDL, and Triglycerides were discussed with the patient.  2) Blood Pressure /Hypertension:  her blood pressure is controlled to target.   she is advised to continue her current medications including Norvasc 2.5 mg p.o. daily with breakfast, HCTZ 25 mg po daily, and Enalapril 5 mg po daily.  3) Lipids/Hyperlipidemia:    Review of her recent lipid panel from 02/10/22 showed controlled LDL at 84 .  she is advised to continue Zetia 10 mg daily at bedtime.  Side effects and precautions discussed with her.  4)  Weight/Diet:  her Body mass index is 25.6 kg/m.  -  she is NOT a candidate for weight loss.  Exercise, and detailed carbohydrates information provided  -  detailed on discharge instructions.  5) Hypothyroidism- RAI induced She was diagnosed with hyperthyroidism over 15 years ago which subsequently led to her RAI ablation and now hypothyroidism.    Her previsit TFTs are consistent with appropriate hormone replacement.  She is advised to continue Levothyroxine 50 mcg po daily before breakfast.     - The correct intake of thyroid hormone (Levothyroxine, Synthroid), is on empty stomach first thing in the morning, with water, separated by at least 30 minutes from breakfast and other medications,  and separated by more than 4 hours from calcium, iron, multivitamins, acid reflux medications (PPIs).  - This medication is a life-long medication and will be needed to correct thyroid hormone imbalances for the rest of your life.  The dose may change from time to time, based on thyroid blood work.  - It is extremely important to be consistent taking this medication, near the same time each morning.  -AVOID TAKING PRODUCTS CONTAINING BIOTIN (commonly found in Hair, Skin, Nails vitamins) AS IT INTERFERES WITH THE VALIDITY OF THYROID FUNCTION BLOOD TESTS.  6) Chronic Care/Health Maintenance: -she is on ACEI/ARB and not on Statin medications and is encouraged to initiate and continue to follow up with Ophthalmology, Dentist, Podiatrist at least yearly or according to recommendations, and advised to stay away from smoking. I have recommended yearly flu vaccine and pneumonia vaccine at least every 5 years; moderate intensity exercise for up to 150 minutes weekly; and sleep for at least 7 hours a day.  - she is advised to maintain close follow up with [name], FNP for primary care needs, as well as her other providers for optimal and coordinated care.      I spent  49  minutes in the care of the patient today including review of labs from [place], Lipids, Thyroid Function, Hematology (current and previous including abstractions from other facilities); face-to-face time discussing  her blood glucose readings/logs, discussing hypoglycemia and hyperglycemia episodes and symptoms, medications doses, her options of short and long term treatment based on the latest standards of care / guidelines;  discussion about incorporating lifestyle medicine;  and documenting the encounter. Risk reduction counseling  performed per USPSTF guidelines to reduce obesity and cardiovascular risk factors.     Please refer to Patient Instructions for Blood Glucose Monitoring and Insulin/Medications Dosing Guide"  in media tab for additional information. Please  also refer to " Patient Self Inventory" in the Media  tab for reviewed elements of pertinent patient history.  Shawnte Rogel Agostinelli participated in the discussions, expressed understanding, and voiced agreement with the above plans.  All questions were answered to her satisfaction. she is  encouraged to contact clinic should she have any questions or concerns prior to her return visit.     Follow up plan: - Return in about 4 months (around 04/29/2023) for Diabetes F/U with A1c in office, Bring meter and logs, Thyroid follow up, No previsit labs.   Rayetta Pigg, [facility] [address] [place], [place] [address] Phone: [phone] Fax: [phone]  12/28/2022, 9:07 AM

## 2023-01-05 ENCOUNTER — Ambulatory Visit: Payer: Medicare HMO | Admitting: Internal Medicine

## 2023-01-05 ENCOUNTER — Encounter: Payer: Self-pay | Admitting: Internal Medicine

## 2023-01-05 DIAGNOSIS — L298 Other pruritus: Secondary | ICD-10-CM | POA: Diagnosis not present

## 2023-01-05 DIAGNOSIS — L821 Other seborrheic keratosis: Secondary | ICD-10-CM | POA: Diagnosis not present

## 2023-01-05 DIAGNOSIS — D225 Melanocytic nevi of trunk: Secondary | ICD-10-CM | POA: Diagnosis not present

## 2023-01-05 DIAGNOSIS — Z0000 Encounter for general adult medical examination without abnormal findings: Secondary | ICD-10-CM

## 2023-01-05 DIAGNOSIS — Z85820 Personal history of malignant melanoma of skin: Secondary | ICD-10-CM | POA: Diagnosis not present

## 2023-01-05 DIAGNOSIS — L814 Other melanin hyperpigmentation: Secondary | ICD-10-CM | POA: Diagnosis not present

## 2023-01-05 NOTE — Progress Notes (Signed)
Subjective:  This is a telephone encounter between Angela Rose and Angela Rose on 01/05/2023 for AWV. The visit was conducted with the patient located at home and Angela Rose at [facility] [place]. The patient's identity was confirmed using their DOB and current address. The patient has consented to being evaluated through a telephone encounter and understands the associated risks (an examination cannot be done and the patient may need to come in for an appointment) / benefits (allows the patient to remain at home, decreasing exposure to coronavirus).     Angela Rose is a 77 y.o. female who presents for Medicare Annual (Subsequent) preventive examination.  Review of Systems    Review of Systems  All other systems reviewed and are negative.   Objective:    There were no vitals filed for this visit. There is no height or weight on file to calculate BMI.     01/05/2023   10:16 AM 09/15/2022    4:51 PM 12/14/2021    2:08 PM 10/15/2019    2:23 PM 09/20/2018    1:54 PM 06/20/2017    3:31 PM  Advanced Directives  Does Patient Have a Medical Advance Directive? No No No;Yes Yes No Yes  Type of Personnel officer of [place];Living will  [place];Living will  Does patient want to make changes to medical advance directive?   No - Patient declined Yes (MAU/Ambulatory/Procedural Areas - Information given)    Copy of [place] in Chart?   No - copy requested No - copy requested  No - copy requested  Would patient like information on creating a medical advance directive? Yes (MAU/Ambulatory/Procedural Areas - Information given)  No - Patient declined  Yes (MAU/Ambulatory/Procedural Areas - Information given)     Current Medications (verified) Outpatient Encounter Medications as of 01/05/2023  Medication Sig   amLODipine (NORVASC) 2.5 MG tablet Take 1 tablet (2.5 mg total) by mouth daily.   aspirin 81 MG  tablet Take by mouth every evening.    azelastine (ASTELIN) 0.1 % nasal spray Place 1 spray into both nostrils 2 (two) times daily. Use in each nostril as directed   Blood Glucose Monitoring Suppl DEVI 1 each by Does not apply route in the morning, at noon, and at bedtime. May substitute to any manufacturer covered by patient's insurance.   cetirizine (ZYRTEC) 5 MG tablet Take 1 tablet (5 mg total) by mouth daily.   enalapril (VASOTEC) 5 MG tablet Take 1 tablet (5 mg total) by mouth 2 (two) times daily.   Evolocumab (REPATHA) 140 MG/ML SOSY Inject 140 mg into the skin every 14 (fourteen) days.   ezetimibe (ZETIA) 10 MG tablet Take 1 tablet (10 mg total) by mouth daily.   FLUoxetine (PROZAC) 40 MG capsule TAKE 1 CAPSULE BY MOUTH  DAILY   Glucose Blood (BLOOD GLUCOSE TEST STRIPS) STRP Use to check glucose twice daily   glucose blood test strip Frequency:PHARMDIR   Dosage:0.0     Instructions:  Note:testing 3-4xqd, DX Code 250.00 Dose: 1   hydrochlorothiazide (HYDRODIURIL) 25 MG tablet Take 1 tablet (25 mg total) by mouth daily.   insulin detemir (LEVEMIR FLEXPEN) 100 UNIT/ML FlexPen Inject 16 Units into the skin at bedtime.   Insulin Pen Needle (BD PEN NEEDLE NANO U/F) 32G X 4 MM MISC Use to check glucose once daily   levothyroxine (SYNTHROID) 50 MCG tablet Take 1 tablet (50 mcg total) by mouth daily before  breakfast.   metFORMIN (GLUCOPHAGE-XR) 500 MG 24 hr tablet Take 1 tablet (500 mg total) by mouth daily with breakfast.   sodium chloride (OCEAN) 0.65 % SOLN nasal spray Place 1 spray into both nostrils as needed for congestion.   Vitamin D, Ergocalciferol, (DRISDOL) 1.25 MG (50000 UNIT) CAPS capsule Take 1 capsule (50,000 Units total) by mouth every 7 (seven) days.   [DISCONTINUED] amoxicillin-clavulanate (AUGMENTIN) 875-125 MG tablet Take 1 tablet by mouth every 12 (twelve) hours. (Patient not taking: Reported on 12/28/2022)   [DISCONTINUED] guaiFENesin (MUCINEX) 600 MG 12 hr tablet Take 1 tablet  (600 mg total) by mouth 2 (two) times daily. (Patient not taking: Reported on 12/28/2022)   [DISCONTINUED] promethazine-dextromethorphan (PROMETHAZINE-DM) 6.25-15 MG/5ML syrup Take 5 mLs by mouth 4 (four) times daily as needed. (Patient not taking: Reported on 12/28/2022)   No facility-administered encounter medications on file as of 01/05/2023.    Allergies (verified) Propoxyphene, Lidocaine-epinephrine, Other, Tape, Xylocaine  [lidocaine hcl], Statins, and Jardiance [empagliflozin]   History: Past Medical History:  Diagnosis Date   Anxiety    Cancer ([place])    melanoma face   Depression    Diabetes mellitus without complication ([place])    Fracture of tibial plateau 12/31/2015   Hyperlipidemia    Hypertension    Past Surgical History:  Procedure Laterality Date   ABDOMINAL HYSTERECTOMY     APPENDECTOMY     BASAL CELL CARCINOMA EXCISION     BREAST BIOPSY Right    needle bx-neg   CATARACT EXTRACTION, BILATERAL Bilateral 11/23/2018   CORONARY ARTERY BYPASS GRAFT  1996   x 3   MELANOMA EXCISION     PARTIAL HYSTERECTOMY  1976   ovaries remain   Family History  Problem Relation Age of Onset   Skin cancer Mother    Squamous cell carcinoma Mother    Basal cell carcinoma Mother    Heart disease Father    CAD Father    Breast cancer Neg Hx    Social History   Socioeconomic History   Marital status: Significant Other    Spouse name: Not on file   Number of children: 3   Years of education: Not on file   Highest education level: Some college, no degree  Occupational History   Occupation: retired  Tobacco Use   Smoking status: Former    Types: Cigarettes    Quit date: 06/11/1982    Years since quitting: 40.5   Smokeless tobacco: Never   Tobacco comments:    smoking cessation materials not required  Vaping Use   Vaping Use: Never used  Substance and Sexual Activity   Alcohol use: Yes    Alcohol/week: 0.0 standard drinks of alcohol    Comment: rare 1 wine glass of wine    Drug use: No   Sexual activity: Not on file  Other Topics Concern   Not on file  Social History Narrative   Not on file   Social Determinants of Health   Financial Resource Strain: Low Risk  (12/14/2021)   Overall Financial Resource Strain (CARDIA)    Difficulty of Paying Living Expenses: Not hard at all  Food Insecurity: No Food Insecurity (12/14/2021)   Hunger Vital Sign    Worried About Running Out of Food in the Last Year: Never true    Ran Out of Food in the Last Year: Never true  Transportation Needs: No Transportation Needs (12/14/2021)   PRAPARE - Hydrologist (Medical): No  Lack of Transportation (Non-Medical): No  Physical Activity: Inactive (12/14/2021)   Exercise Vital Sign    Days of Exercise per Week: 0 days    Minutes of Exercise per Session: 0 min  Stress: Stress Concern Present (12/14/2021)   [place]    Feeling of Stress : To some extent  Social Connections: Moderately Isolated (12/14/2021)   Social Connection and Isolation Panel [NHANES]    Frequency of Communication with Friends and Family: More than three times a week    Frequency of Social Gatherings with Friends and Family: Once a week    Attends Religious Services: Never    Marine scientist or Organizations: No    Attends Music therapist: Never    Marital Status: Living with partner    Tobacco Counseling Counseling given: Not Answered Tobacco comments: smoking cessation materials not required   Clinical Intake:  Pre-visit preparation completed: Yes  Pain : No/denies pain     Diabetes: Yes CBG done?: No Did pt. bring in CBG monitor from home?: No  How often do you need to have someone help you when you read instructions, pamphlets, or other written materials from your doctor or pharmacy?: 1 - Never What is the last grade level you completed in school?: Some College    Activities of  Daily Living    01/05/2023   10:20 AM  In your present state of health, do you have any difficulty performing the following activities:  Hearing? 0  Vision? 0  Difficulty concentrating or making decisions? 0  Walking or climbing stairs? 0  Dressing or bathing? 0  Doing errands, shopping? 0    Patient Care Team: [name], [place] as PCP - General (Family Medicine) Janina Mayo, MD as PCP - Cardiology (Cardiology) Hollice Espy, MD as Consulting Physician (Urology) Vladimir Faster, [facility] (Inactive) (Pharmacist)  Indicate any recent Medical Services you may have received from other than [facility] providers in the past year (date may be approximate).     Assessment:   This is a routine wellness examination for [place].  Hearing/Vision screen No results found.  Dietary issues and exercise activities discussed:     Goals Addressed   None    Depression Screen    01/05/2023   10:20 AM 12/08/2022   10:27 AM 09/07/2022    9:55 AM 07/02/2022   11:34 AM 05/06/2022    9:51 AM 02/10/2022    8:44 AM 12/14/2021    2:11 PM  PHQ 2/9 Scores  PHQ - 2 Score 2 3 1  0 0 0 0  PHQ- 9 Score  4 2        Fall Risk    01/05/2023   10:20 AM 12/08/2022   10:27 AM 09/07/2022    9:54 AM 07/02/2022   11:34 AM 05/06/2022    9:51 AM  Fall Risk   Falls in the past year? 0 0 1 1 1   Number falls in past yr:  0 0 0 0  Injury with Fall?  0 0 1 0  Risk for fall due to :  No Fall Risks No Fall Risks Other (Comment) History of fall(s)  Follow up  Falls evaluation completed Falls evaluation completed Falls evaluation completed Falls evaluation completed    [place]:  Any stairs in or around the home? Yes  If so, are there any without handrails? Yes  Home free of loose throw rugs  in walkways, pet beds, electrical cords, etc? Yes  Adequate lighting in your home to reduce risk of falls? Yes   ASSISTIVE DEVICES UTILIZED TO PREVENT FALLS:  Life alert? No  Use of a cane,  walker or w/c? No  Grab bars in the bathroom? No  Shower chair or bench in shower? No  Elevated toilet seat or a handicapped toilet? Yes    Cognitive Function:    12/14/2021    2:14 PM  MMSE - Mini Mental State Exam  Not completed: Unable to complete        01/05/2023   10:21 AM 12/14/2021    2:14 PM 10/15/2019    2:45 PM 09/20/2018    2:07 PM 06/20/2017    3:38 PM  6CIT Screen  What Year? 0 points 0 points 0 points 0 points 0 points  What month? 0 points 0 points 0 points 0 points 0 points  What time? 0 points 0 points 0 points 0 points 0 points  Count back from 20 0 points 0 points 0 points 0 points 0 points  Months in reverse 0 points 0 points 0 points 0 points 0 points  Repeat phrase 0 points 0 points 0 points 2 points 2 points  Total Score 0 points 0 points 0 points 2 points 2 points    Immunizations Immunization History  Administered Date(s) Administered   Fluad Quad(high Dose 65+) 07/25/2019, 08/20/2020, 09/30/2021, 09/07/2022   Influenza, High Dose Seasonal PF 06/20/2017, 06/23/2018   Influenza,inj,Quad PF,6+ Mos 08/14/2015, 09/27/2016   PFIZER Comirnaty(Gray Top)Covid-19 Tri-Sucrose Vaccine 12/01/2019, 12/23/2019   PFIZER(Purple Top)SARS-COV-2 Vaccination 12/01/2019, 12/23/2019, 07/20/2020, 04/24/2021   Pneumococcal Conjugate-13 02/16/2016   Pneumococcal Polysaccharide-23 11/15/2008, 10/14/2014    TDAP status: Due, Education has been provided regarding the importance of this vaccine. Advised may receive this vaccine at local pharmacy or Health Dept. Aware to provide a copy of the vaccination record if obtained from local pharmacy or Health Dept. Verbalized acceptance and understanding.  Flu Vaccine status: Up to date  Pneumococcal vaccine status: Up to date  Covid-19 vaccine status: Information provided on how to obtain vaccines.   Qualifies for Shingles Vaccine? Yes   Zostavax completed No   Shingrix Completed?: No.    Education has been provided regarding the  importance of this vaccine. Patient has been advised to call insurance company to determine out of pocket expense if they have not yet received this vaccine. Advised may also receive vaccine at local pharmacy or Health Dept. Verbalized acceptance and understanding.  Screening Tests Health Maintenance  Topic Date Due   COVID-19 Vaccine (7 - 2023-24 season) 06/11/2022   OPHTHALMOLOGY EXAM  10/19/2022   Medicare Annual Wellness (AWV)  12/15/2022   Zoster Vaccines- Shingrix (1 of 2) 03/08/2023 (Originally 11/03/1964)   HEMOGLOBIN A1C  06/06/2023   MAMMOGRAM  11/27/2023   Diabetic kidney evaluation - Urine ACR  12/07/2023   FOOT EXAM  12/15/2023   Diabetic kidney evaluation - eGFR measurement  12/27/2023   Pneumonia Vaccine 28+ Years old  Completed   INFLUENZA VACCINE  Completed   DEXA SCAN  Completed   Hepatitis C Screening  Completed   HPV VACCINES  Aged Out   DTaP/Tdap/Td  Discontinued   COLONOSCOPY (Pts 45-53yrs Insurance coverage will need to be confirmed)  Discontinued    Health Maintenance  Health Maintenance Due  Topic Date Due   COVID-19 Vaccine (7 - 2023-24 season) 06/11/2022   OPHTHALMOLOGY EXAM  10/19/2022   Medicare Annual Wellness (AWV)  12/15/2022    Colorectal cancer screening: No longer required.   Mammogram status: Completed 11/26/2022. Repeat every year  Bone Density status: Completed 11/13/2018. Results reflect: Bone density results: OSTEOPENIA. Repeat every 2 years.  Lung Cancer Screening: (Low Dose CT Chest recommended if Age 71-80 years, 30 pack-year currently smoking OR have quit w/in 15years.) does not qualify.   Additional Screening:  Hepatitis C Screening: does not qualify; Completed 08/11/2018  Vision Screening: Recommended annual ophthalmology exams for early detection of glaucoma and other disorders of the eye. Is the patient up to date with their annual eye exam?  Yes  Who is the provider or what is the name of the office in which the patient attends  annual eye exams? [facility] If pt is not established with a provider, would they like to be referred to a provider to establish care? No .   Dental Screening: Recommended annual dental exams for proper oral hygiene  Community Resource Referral / Chronic Care Management: CRR required this visit?  No   CCM required this visit?  No      Plan:     I have personally reviewed and noted the following in the patient's chart:   Medical and social history Use of alcohol, tobacco or illicit drugs  Current medications and supplements including opioid prescriptions. Patient is not currently taking opioid prescriptions. Functional ability and status Nutritional status Physical activity Advanced directives List of other physicians Hospitalizations, surgeries, and ER visits in previous 12 months Vitals Screenings to include cognitive, depression, and falls Referrals and appointments  In addition, I have reviewed and discussed with patient certain preventive protocols, quality metrics, and best practice recommendations. A written personalized care plan for preventive services as well as general preventive health recommendations were provided to patient.     Angela Dy, MD   01/05/2023

## 2023-01-05 NOTE — Patient Instructions (Signed)
Ms. Babiak , Thank you for taking time to come for your Medicare Wellness Visit. I appreciate your ongoing commitment to your health goals. Please review the following plan we discussed and let me know if I can assist you in the future.   These are the goals we discussed:  Goals      DIET - INCREASE WATER INTAKE     Recommend drinking 6-8 glasses of water per day      Increase physical activity     Recommend increasing physical activity to at least 3 times per week     Patient Stated     Be able to grown a squash in the garden.     PharmD " I want to be healthy"     CARE PLAN ENTRY (see longitudinal plan of care for additional care plan information)  Current Barriers:  Chronic Disease Management support, education, and care coordination needs related to Hypertension, Hyperlipidemia, and Diabetes   Hypertension BP Readings from Last 3 Encounters:  08/20/20 128/70  05/02/20 (!) 130/56  12/25/19 136/72  Pharmacist Clinical Goal(s): Over the next 60 days, patient will work with PharmD and providers to maintain BP goal <130/80 Current regimen:  Enalapril 5mg  bid Amlodipine 2.5mg   qd Hydrochlorothiazide 25 mg qd Interventions: Comprehensive medication review performed, medication list updated in electronic medical record Inter-disciplinary care team collaboration (see longitudinal plan of care) Provided diet and exercise counseling. Patient self care activities - Over the next 60 days, patient will: Ensure daily salt intake < 2300 mg/day Increase exercise with goal of 30 min/day 5 days/week  Hyperlipidemia Lab Results  Component Value Date/Time   LDLCALC 110 07/24/2020 12:00 AM   LDLCALC 103 (H) 12/25/2019 11:08 AM  Pharmacist Clinical Goal(s): Over the next 60 days, patient will work with PharmD and providers to achieve LDL goal < 70 Current regimen:  Lovastatin 20 mg qd  ASA 81 mg qd Interventions: Comprehensive medication review performed, medication list  updated in electronic medical record Inter-disciplinary care team collaboration (see longitudinal plan of care) Will discuss increasing atorvastatin dose to 40 mg with PCP Patient self care activities - Over the next 60 days, patient will: Take medications as prescribed Increase exercise with goal of 30 min/day 5 days/week Focus on DASH diet  Diabetes Lab Results  Component Value Date/Time   HGBA1C 7.7 08/12/2020 12:00 AM   HGBA1C 8.7 (H) 05/02/2020 10:17 AM   HGBA1C 8.9 05/01/2020 12:00 AM  Pharmacist Clinical Goal(s): Over the next 60 days, patient will work with PharmD and providers to achieve A1c goal <7% Current regimen:  Insulin detemir (Levemir) 25-30 u qam, 20 units qpm Metformin er 500 mg 2 tabs bid SOUL study drug 3mg , 7 tabs qd (semaglutide oral vs. placebo Interventions: Comprehensive medication review performed, medication list updated in electronic medical record Inter-disciplinary care team collaboration (see longitudinal plan of care) Reviewed goal glucose readings for an A1c of <7%, we want to see fasting sugars <130 and 2 hour after meal sugars <180.  Reviewed 15/15 rule for treating hypoglycemia Patient self care activities - Over the next 60 days, patient will: Check blood sugar twice daily, document, and provide at future appointments Contact provider with any episodes of hypoglycemia Provide required portion of patient assistance documentation if needed.  Medication management Pharmacist Clinical Goal(s): Over the next 60 days, patient will work with PharmD and providers to achieve optimal medication adherence Current pharmacy: [facility]  Interventions Comprehensive medication review performed. Continue current medication management  strategy Patient self care activities - Over the next 60 days, patient will: Take medications as prescribed Report any questions or concerns to PharmD and/or provider(s)  Initial goal documentation          This is a list of the screening recommended for you and due dates:  Health Maintenance  Topic Date Due   COVID-19 Vaccine (7 - 2023-24 season) 06/11/2022   Eye exam for diabetics  10/19/2022   Medicare Annual Wellness Visit  12/15/2022   Zoster (Shingles) Vaccine (1 of 2) 03/08/2023*   Hemoglobin A1C  06/06/2023   Mammogram  11/27/2023   Yearly kidney health urinalysis for diabetes  12/07/2023   Complete foot exam   12/15/2023   Yearly kidney function blood test for diabetes  12/27/2023   Pneumonia Vaccine  Completed   Flu Shot  Completed   DEXA scan (bone density measurement)  Completed   Hepatitis C Screening: USPSTF Recommendation to screen - Ages 19-79 yo.  Completed   HPV Vaccine  Aged Out   DTaP/Tdap/Td vaccine  Discontinued   Colon Cancer Screening  Discontinued  *Topic was postponed. The date shown is not the original due date.

## 2023-01-12 ENCOUNTER — Other Ambulatory Visit: Payer: Self-pay

## 2023-01-12 ENCOUNTER — Ambulatory Visit: Payer: Medicare HMO | Admitting: Nurse Practitioner

## 2023-01-12 ENCOUNTER — Emergency Department (HOSPITAL_COMMUNITY): Payer: Medicare HMO

## 2023-01-12 ENCOUNTER — Inpatient Hospital Stay (HOSPITAL_COMMUNITY)
Admission: EM | Admit: 2023-01-12 | Discharge: 2023-01-16 | DRG: 251 | Disposition: A | Discharge status: Home / Self Care | Payer: Medicare HMO | Attending: Cardiology | Admitting: Cardiology

## 2023-01-12 ENCOUNTER — Encounter (HOSPITAL_COMMUNITY): Payer: Self-pay | Admitting: Emergency Medicine

## 2023-01-12 DIAGNOSIS — I25810 Atherosclerosis of coronary artery bypass graft(s) without angina pectoris: Secondary | ICD-10-CM | POA: Diagnosis present

## 2023-01-12 DIAGNOSIS — I252 Old myocardial infarction: Secondary | ICD-10-CM

## 2023-01-12 DIAGNOSIS — I130 Hypertensive heart and chronic kidney disease with heart failure and stage 1 through stage 4 chronic kidney disease, or unspecified chronic kidney disease: Secondary | ICD-10-CM | POA: Diagnosis present

## 2023-01-12 DIAGNOSIS — Z66 Do not resuscitate: Secondary | ICD-10-CM | POA: Diagnosis present

## 2023-01-12 DIAGNOSIS — I25110 Atherosclerotic heart disease of native coronary artery with unstable angina pectoris: Secondary | ICD-10-CM | POA: Diagnosis not present

## 2023-01-12 DIAGNOSIS — I2510 Atherosclerotic heart disease of native coronary artery without angina pectoris: Secondary | ICD-10-CM | POA: Diagnosis present

## 2023-01-12 DIAGNOSIS — I447 Left bundle-branch block, unspecified: Secondary | ICD-10-CM | POA: Diagnosis present

## 2023-01-12 DIAGNOSIS — Z8719 Personal history of other diseases of the digestive system: Secondary | ICD-10-CM

## 2023-01-12 DIAGNOSIS — Z808 Family history of malignant neoplasm of other organs or systems: Secondary | ICD-10-CM

## 2023-01-12 DIAGNOSIS — Z951 Presence of aortocoronary bypass graft: Secondary | ICD-10-CM

## 2023-01-12 DIAGNOSIS — N189 Chronic kidney disease, unspecified: Secondary | ICD-10-CM | POA: Diagnosis present

## 2023-01-12 DIAGNOSIS — E1169 Type 2 diabetes mellitus with other specified complication: Secondary | ICD-10-CM | POA: Diagnosis present

## 2023-01-12 DIAGNOSIS — E785 Hyperlipidemia, unspecified: Secondary | ICD-10-CM | POA: Diagnosis present

## 2023-01-12 DIAGNOSIS — Z79890 Hormone replacement therapy (postmenopausal): Secondary | ICD-10-CM

## 2023-01-12 DIAGNOSIS — Z87891 Personal history of nicotine dependence: Secondary | ICD-10-CM | POA: Diagnosis not present

## 2023-01-12 DIAGNOSIS — I2582 Chronic total occlusion of coronary artery: Secondary | ICD-10-CM | POA: Diagnosis present

## 2023-01-12 DIAGNOSIS — F419 Anxiety disorder, unspecified: Secondary | ICD-10-CM | POA: Diagnosis present

## 2023-01-12 DIAGNOSIS — I5022 Chronic systolic (congestive) heart failure: Secondary | ICD-10-CM | POA: Diagnosis present

## 2023-01-12 DIAGNOSIS — Z85828 Personal history of other malignant neoplasm of skin: Secondary | ICD-10-CM

## 2023-01-12 DIAGNOSIS — Z888 Allergy status to other drugs, medicaments and biological substances status: Secondary | ICD-10-CM | POA: Diagnosis not present

## 2023-01-12 DIAGNOSIS — I213 ST elevation (STEMI) myocardial infarction of unspecified site: Secondary | ICD-10-CM

## 2023-01-12 DIAGNOSIS — I25709 Atherosclerosis of coronary artery bypass graft(s), unspecified, with unspecified angina pectoris: Secondary | ICD-10-CM | POA: Diagnosis present

## 2023-01-12 DIAGNOSIS — I2119 ST elevation (STEMI) myocardial infarction involving other coronary artery of inferior wall: Secondary | ICD-10-CM

## 2023-01-12 DIAGNOSIS — Z7982 Long term (current) use of aspirin: Secondary | ICD-10-CM | POA: Diagnosis not present

## 2023-01-12 DIAGNOSIS — F32A Depression, unspecified: Secondary | ICD-10-CM | POA: Diagnosis present

## 2023-01-12 DIAGNOSIS — E1122 Type 2 diabetes mellitus with diabetic chronic kidney disease: Secondary | ICD-10-CM | POA: Diagnosis present

## 2023-01-12 DIAGNOSIS — Z794 Long term (current) use of insulin: Secondary | ICD-10-CM | POA: Diagnosis not present

## 2023-01-12 DIAGNOSIS — E118 Type 2 diabetes mellitus with unspecified complications: Secondary | ICD-10-CM | POA: Diagnosis present

## 2023-01-12 DIAGNOSIS — Z8249 Family history of ischemic heart disease and other diseases of the circulatory system: Secondary | ICD-10-CM | POA: Diagnosis not present

## 2023-01-12 DIAGNOSIS — Z9189 Other specified personal risk factors, not elsewhere classified: Secondary | ICD-10-CM

## 2023-01-12 DIAGNOSIS — I10 Essential (primary) hypertension: Secondary | ICD-10-CM | POA: Diagnosis not present

## 2023-01-12 DIAGNOSIS — Z86010 Personal history of colonic polyps: Secondary | ICD-10-CM

## 2023-01-12 DIAGNOSIS — Z7984 Long term (current) use of oral hypoglycemic drugs: Secondary | ICD-10-CM | POA: Diagnosis not present

## 2023-01-12 DIAGNOSIS — I255 Ischemic cardiomyopathy: Secondary | ICD-10-CM | POA: Diagnosis present

## 2023-01-12 DIAGNOSIS — Z85820 Personal history of malignant melanoma of skin: Secondary | ICD-10-CM

## 2023-01-12 DIAGNOSIS — I5043 Acute on chronic combined systolic (congestive) and diastolic (congestive) heart failure: Secondary | ICD-10-CM | POA: Diagnosis not present

## 2023-01-12 DIAGNOSIS — Z90711 Acquired absence of uterus with remaining cervical stump: Secondary | ICD-10-CM

## 2023-01-12 DIAGNOSIS — Z79899 Other long term (current) drug therapy: Secondary | ICD-10-CM | POA: Diagnosis not present

## 2023-01-12 DIAGNOSIS — R079 Chest pain, unspecified: Secondary | ICD-10-CM | POA: Diagnosis present

## 2023-01-12 LAB — PROTIME-INR
INR: 1 (ref 0.8–1.2)
Prothrombin Time: 12.6 seconds (ref 11.4–15.2)

## 2023-01-12 LAB — CBC
HCT: 38.2 % (ref 36.0–46.0)
Hemoglobin: 12.4 g/dL (ref 12.0–15.0)
MCH: 29.1 pg (ref 26.0–34.0)
MCHC: 32.5 g/dL (ref 30.0–36.0)
MCV: 89.7 fL (ref 80.0–100.0)
Platelets: 273 10*3/uL (ref 150–400)
RBC: 4.26 MIL/uL (ref 3.87–5.11)
RDW: 13.8 % (ref 11.5–15.5)
WBC: 9 10*3/uL (ref 4.0–10.5)
nRBC: 0 % (ref 0.0–0.2)

## 2023-01-12 LAB — APTT: aPTT: 25 seconds (ref 24–36)

## 2023-01-12 MED ORDER — MORPHINE SULFATE (PF) 4 MG/ML IV SOLN
4.0000 mg | Freq: Once | INTRAVENOUS | Status: AC
  Administered 2023-01-12: 4 mg via INTRAVENOUS

## 2023-01-12 MED ORDER — ONDANSETRON HCL 4 MG/2ML IJ SOLN
INTRAMUSCULAR | Status: AC
  Filled 2023-01-12: qty 2

## 2023-01-12 MED ORDER — MORPHINE SULFATE (PF) 4 MG/ML IV SOLN
INTRAVENOUS | Status: AC
  Filled 2023-01-12: qty 1

## 2023-01-12 MED ORDER — ONDANSETRON HCL 4 MG/2ML IJ SOLN
4.0000 mg | Freq: Once | INTRAMUSCULAR | Status: AC
  Administered 2023-01-12: 4 mg via INTRAVENOUS

## 2023-01-12 MED ORDER — ASPIRIN 81 MG PO CHEW
324.0000 mg | CHEWABLE_TABLET | Freq: Once | ORAL | Status: AC
  Administered 2023-01-12: 324 mg via ORAL
  Filled 2023-01-12: qty 4

## 2023-01-12 MED ORDER — HEPARIN SODIUM (PORCINE) 5000 UNIT/ML IJ SOLN
4000.0000 [IU] | Freq: Once | INTRAMUSCULAR | Status: AC
  Administered 2023-01-12: 4000 [IU] via INTRAVENOUS
  Filled 2023-01-12: qty 1

## 2023-01-12 MED ORDER — SODIUM CHLORIDE 0.9 % IV SOLN: INTRAVENOUS | Status: DC

## 2023-01-12 NOTE — ED Provider Notes (Incomplete)
[facility] Provider Note   CSN: [id number] Arrival date & time: 01/12/23  2317     History {Add pertinent medical, surgical, social history, OB history to HPI:1} Chief Complaint  Patient presents with  . Chest Pain    Angela Rose is a 77 y.o. female.  Patient presents to the emergency department for evaluation of chest pain.  Patient reports that she was just finishing watching a movie when she developed fairly severe substernal chest pain that radiated to both arms and back.  She has mild shortness of breath.  No nausea or vomiting.  Patient reports that she has a history of heart disease, had three-vessel bypass in 1996.  The pain she is experiencing today feels like her prior heart attack in 1993.       Home Medications Prior to Admission medications   Medication Sig Start Date End Date Taking? Authorizing Provider  amLODipine (NORVASC) 2.5 MG tablet Take 1 tablet (2.5 mg total) by mouth daily. 11/29/22 02/27/23  [name], FNP  aspirin 81 MG tablet Take by mouth every evening.     [provider]  azelastine (ASTELIN) 0.1 % nasal spray Place 1 spray into both nostrils 2 (two) times daily. Use in each nostril as directed 07/02/22   [name], FNP  Blood Glucose Monitoring Suppl DEVI 1 each by Does not apply route in the morning, at noon, and at bedtime. May substitute to any manufacturer covered by patient's insurance. 11/29/22   [name], FNP  cetirizine (ZYRTEC) 5 MG tablet Take 1 tablet (5 mg total) by mouth daily. 07/02/22   [name], FNP  enalapril (VASOTEC) 5 MG tablet Take 1 tablet (5 mg total) by mouth 2 (two) times daily. 11/29/22 02/27/23  [name], FNP  Evolocumab (REPATHA) 140 MG/ML SOSY Inject 140 mg into the skin every 14 (fourteen) days. 12/17/22   [name], FNP  ezetimibe (ZETIA) 10 MG tablet Take 1 tablet (10 mg total) by mouth daily. 02/10/22   Renee Rival, FNP   FLUoxetine (PROZAC) 40 MG capsule TAKE 1 CAPSULE BY MOUTH  DAILY 11/20/21   Glean Hess, MD  Glucose Blood (BLOOD GLUCOSE TEST STRIPS) STRP Use to check glucose twice daily 12/28/22   Brita Romp, NP  glucose blood test strip Frequency:PHARMDIR   Dosage:0.0     Instructions:  Note:testing 3-4xqd, DX Code 250.00 Dose: 1 12/24/08   [provider]  hydrochlorothiazide (HYDRODIURIL) 25 MG tablet Take 1 tablet (25 mg total) by mouth daily. 11/29/22   [name], FNP  insulin detemir (LEVEMIR FLEXPEN) 100 UNIT/ML FlexPen Inject 16 Units into the skin at bedtime. 12/28/22   Brita Romp, NP  Insulin Pen Needle (BD PEN NEEDLE NANO U/F) 32G X 4 MM MISC Use to check glucose once daily 12/28/22   Brita Romp, NP  levothyroxine (SYNTHROID) 50 MCG tablet Take 1 tablet (50 mcg total) by mouth daily before breakfast. 12/28/22 12/23/23  Brita Romp, NP  metFORMIN (GLUCOPHAGE-XR) 500 MG 24 hr tablet Take 1 tablet (500 mg total) by mouth daily with breakfast. 12/28/22   Brita Romp, NP  sodium chloride (OCEAN) 0.65 % SOLN nasal spray Place 1 spray into both nostrils as needed for congestion. 11/09/21   Paseda, Dewaine Conger, FNP  Vitamin D, Ergocalciferol, (DRISDOL) 1.25 MG (50000 UNIT) CAPS capsule Take 1 capsule (50,000 Units total) by mouth every 7 (seven) days. 12/08/22   [name], [place]  Allergies    Propoxyphene, Lidocaine-epinephrine, Other, Tape, Xylocaine  [lidocaine hcl], Statins, and Jardiance [empagliflozin]    Review of Systems   Review of Systems  Physical Exam Updated Vital Signs BP (!) 179/80 (BP Location: Right Arm)   Pulse 85   Temp 97.9 F (36.6 C) (Oral)   Resp 17   Ht 5\' 6"  (1.676 m)   Wt 71.7 kg   SpO2 100%   BMI 25.50 kg/m  Physical Exam Vitals and nursing note reviewed.  Constitutional:      General: She is not in acute distress.    Appearance: She is well-developed.  HENT:     Head: Normocephalic and atraumatic.      Mouth/Throat:     Mouth: Mucous membranes are moist.  Eyes:     General: Vision grossly intact. Gaze aligned appropriately.     Extraocular Movements: Extraocular movements intact.     Conjunctiva/sclera: Conjunctivae normal.  Cardiovascular:     Rate and Rhythm: Normal rate and regular rhythm.     Pulses: Normal pulses.     Heart sounds: Normal heart sounds, S1 normal and S2 normal. No murmur heard.    No friction rub. No gallop.  Pulmonary:     Effort: Pulmonary effort is normal. No respiratory distress.     Breath sounds: Normal breath sounds.  Abdominal:     General: Bowel sounds are normal.     Palpations: Abdomen is soft.     Tenderness: There is no abdominal tenderness. There is no guarding or rebound.     Hernia: No hernia is present.  Musculoskeletal:        General: No swelling.     Cervical back: Full passive range of motion without pain, normal range of motion and neck supple. No spinous process tenderness or muscular tenderness. Normal range of motion.     Right lower leg: No edema.     Left lower leg: No edema.  Skin:    General: Skin is warm and dry.     Capillary Refill: Capillary refill takes less than 2 seconds.     Findings: No ecchymosis, erythema, rash or wound.  Neurological:     General: No focal deficit present.     Mental Status: She is alert and oriented to person, place, and time.     GCS: GCS eye subscore is 4. GCS verbal subscore is 5. GCS motor subscore is 6.     Cranial Nerves: Cranial nerves 2-12 are intact.     Sensory: Sensation is intact.     Motor: Motor function is intact.     Coordination: Coordination is intact.  Psychiatric:        Attention and Perception: Attention normal.        Mood and Affect: Mood normal.        Speech: Speech normal.        Behavior: Behavior normal.     ED Results / Procedures / Treatments   Labs (all labs ordered are listed, but only abnormal results are displayed) Labs Reviewed  CBC  PROTIME-INR  APTT   BASIC METABOLIC PANEL  HEMOGLOBIN A1C  LIPID PANEL  HEPATIC FUNCTION PANEL  TROPONIN I (HIGH SENSITIVITY)    EKG EKG Interpretation  Date/Time:  Wednesday January 12 2023 23:31:47 EDT Ventricular Rate:  82 PR Interval:  242 QRS Duration: 110 QT Interval:  388 QTC Calculation: 453 R Axis:   75 Text Interpretation: *** Critical Test Result: STEMI Sinus rhythm with 1st degree  A-V block with occasional Premature ventricular complexes and Premature atrial complexes Incomplete left bundle branch block ST elevation consider inferior injury or acute infarct ** ** ACUTE MI / STEMI ** ** Consider right ventricular involvement in acute inferior infarct Abnormal ECG No previous ECGs available Confirmed by Orpah Greek [phone]) on 01/12/2023 11:43:48 PM  Radiology No results found.  Procedures Procedures  {Document cardiac monitor, telemetry assessment procedure when appropriate:1}  Medications Ordered in ED Medications  0.9 %  sodium chloride infusion ( Intravenous New Bag/Given 01/12/23 2344)  aspirin chewable tablet 324 mg (324 mg Oral Given 01/12/23 2344)  heparin injection 4,000 Units (4,000 Units Intravenous Given 01/12/23 2343)  morphine (PF) 4 MG/ML injection 4 mg (4 mg Intravenous Given 01/12/23 2348)  ondansetron (ZOFRAN) injection 4 mg (4 mg Intravenous Given 01/12/23 2349)    ED Course/ Medical Decision Making/ A&P   {   Click here for ABCD2, HEART and other calculatorsREFRESH Note before signing :1}                          Medical Decision Making Amount and/or Complexity of Data Reviewed Independent Historian: spouse External Data Reviewed: ECG and notes. Labs: ordered. Decision-making details documented in ED Course. Radiology: ordered and independent interpretation performed. Decision-making details documented in ED Course. ECG/medicine tests: ordered and independent interpretation performed. Decision-making details documented in ED Course.  Risk OTC  drugs. Prescription drug management.   Dents with acute onset chest pain.  Patient with known CAD.  EKG performed in triage concerning for inferior ST elevation MI.  Patient administered aspirin, heparin bolus.  Code STEMI activated.  Discussed with Dr. Ellyn Hack on-call for interventional cardiology.  Patient will be transferred to [facility] for emergent heart catheterization.  CRITICAL CARE Performed by: Orpah Greek   Total critical care time: 35 minutes  Critical care time was exclusive of separately billable procedures and treating other patients.  Critical care was necessary to treat or prevent imminent or life-threatening deterioration.  Critical care was time spent personally by me on the following activities: development of treatment plan with patient and/or surrogate as well as nursing, discussions with consultants, evaluation of patient's response to treatment, examination of patient, obtaining history from patient or surrogate, ordering and performing treatments and interventions, ordering and review of laboratory studies, ordering and review of radiographic studies, pulse oximetry and re-evaluation of patient's condition.   {Document critical care time when appropriate:1} {Document review of labs and clinical decision tools ie heart score, Chads2Vasc2 etc:1}  {Document your independent review of radiology images, and any outside records:1} {Document your discussion with family members, caretakers, and with consultants:1} {Document social determinants of health affecting pt's care:1} {Document your decision making why or why not admission, treatments were needed:1} Final Clinical Impression(s) / ED Diagnoses Final diagnoses:  ST elevation myocardial infarction (STEMI), unspecified artery    Rx / DC Orders ED Discharge Orders     None

## 2023-01-12 NOTE — ED Provider Notes (Signed)
[facility] Provider Note   CSN: [id number] Arrival date & time: 01/12/23  2317     History  Chief Complaint  Patient presents with   Chest Pain    Angela Rose is a 77 y.o. female.  Patient presents to the emergency department for evaluation of chest pain.  Patient reports that she was just finishing watching a movie when she developed fairly severe substernal chest pain that radiated to both arms and back.  She has mild shortness of breath.  No nausea or vomiting.  Patient reports that she has a history of heart disease, had three-vessel bypass in 1996.  The pain she is experiencing today feels like her prior heart attack in 1993.       Home Medications Prior to Admission medications   Medication Sig Start Date End Date Taking? Authorizing Provider  amLODipine (NORVASC) 2.5 MG tablet Take 1 tablet (2.5 mg total) by mouth daily. 11/29/22 02/27/23  [name], FNP  aspirin 81 MG tablet Take by mouth every evening.     [provider]  azelastine (ASTELIN) 0.1 % nasal spray Place 1 spray into both nostrils 2 (two) times daily. Use in each nostril as directed 07/02/22   [name], FNP  Blood Glucose Monitoring Suppl DEVI 1 each by Does not apply route in the morning, at noon, and at bedtime. May substitute to any manufacturer covered by patient's insurance. 11/29/22   [name], FNP  cetirizine (ZYRTEC) 5 MG tablet Take 1 tablet (5 mg total) by mouth daily. 07/02/22   [name], FNP  enalapril (VASOTEC) 5 MG tablet Take 1 tablet (5 mg total) by mouth 2 (two) times daily. 11/29/22 02/27/23  [name], FNP  Evolocumab (REPATHA) 140 MG/ML SOSY Inject 140 mg into the skin every 14 (fourteen) days. 12/17/22   [name], FNP  ezetimibe (ZETIA) 10 MG tablet Take 1 tablet (10 mg total) by mouth daily. 02/10/22   Renee Rival, FNP  FLUoxetine (PROZAC) 40 MG capsule TAKE 1 CAPSULE BY MOUTH  DAILY 11/20/21    Glean Hess, MD  Glucose Blood (BLOOD GLUCOSE TEST STRIPS) STRP Use to check glucose twice daily 12/28/22   Brita Romp, NP  glucose blood test strip Frequency:PHARMDIR   Dosage:0.0     Instructions:  Note:testing 3-4xqd, DX Code 250.00 Dose: 1 12/24/08   [provider]  hydrochlorothiazide (HYDRODIURIL) 25 MG tablet Take 1 tablet (25 mg total) by mouth daily. 11/29/22   [name], FNP  insulin detemir (LEVEMIR FLEXPEN) 100 UNIT/ML FlexPen Inject 16 Units into the skin at bedtime. 12/28/22   Brita Romp, NP  Insulin Pen Needle (BD PEN NEEDLE NANO U/F) 32G X 4 MM MISC Use to check glucose once daily 12/28/22   Brita Romp, NP  levothyroxine (SYNTHROID) 50 MCG tablet Take 1 tablet (50 mcg total) by mouth daily before breakfast. 12/28/22 12/23/23  Brita Romp, NP  metFORMIN (GLUCOPHAGE-XR) 500 MG 24 hr tablet Take 1 tablet (500 mg total) by mouth daily with breakfast. 12/28/22   Brita Romp, NP  sodium chloride (OCEAN) 0.65 % SOLN nasal spray Place 1 spray into both nostrils as needed for congestion. 11/09/21   Paseda, Dewaine Conger, FNP  Vitamin D, Ergocalciferol, (DRISDOL) 1.25 MG (50000 UNIT) CAPS capsule Take 1 capsule (50,000 Units total) by mouth every 7 (seven) days. 12/08/22   [name], FNP      Allergies    Propoxyphene,  Lidocaine-epinephrine, Other, Tape, Xylocaine  [lidocaine hcl], Statins, and Jardiance [empagliflozin]    Review of Systems   Review of Systems  Physical Exam Updated Vital Signs BP (!) 145/73   Pulse 84   Temp 97.9 F (36.6 C) (Oral)   Resp 16   Ht 5\' 6"  (1.676 m)   Wt 71.7 kg   SpO2 97%   BMI 25.50 kg/m  Physical Exam Vitals and nursing note reviewed.  Constitutional:      General: She is not in acute distress.    Appearance: She is well-developed.  HENT:     Head: Normocephalic and atraumatic.     Mouth/Throat:     Mouth: Mucous membranes are moist.  Eyes:     General: Vision grossly intact. Gaze  aligned appropriately.     Extraocular Movements: Extraocular movements intact.     Conjunctiva/sclera: Conjunctivae normal.  Cardiovascular:     Rate and Rhythm: Normal rate and regular rhythm.     Pulses: Normal pulses.     Heart sounds: Normal heart sounds, S1 normal and S2 normal. No murmur heard.    No friction rub. No gallop.  Pulmonary:     Effort: Pulmonary effort is normal. No respiratory distress.     Breath sounds: Normal breath sounds.  Abdominal:     General: Bowel sounds are normal.     Palpations: Abdomen is soft.     Tenderness: There is no abdominal tenderness. There is no guarding or rebound.     Hernia: No hernia is present.  Musculoskeletal:        General: No swelling.     Cervical back: Full passive range of motion without pain, normal range of motion and neck supple. No spinous process tenderness or muscular tenderness. Normal range of motion.     Right lower leg: No edema.     Left lower leg: No edema.  Skin:    General: Skin is warm and dry.     Capillary Refill: Capillary refill takes less than 2 seconds.     Findings: No ecchymosis, erythema, rash or wound.  Neurological:     General: No focal deficit present.     Mental Status: She is alert and oriented to person, place, and time.     GCS: GCS eye subscore is 4. GCS verbal subscore is 5. GCS motor subscore is 6.     Cranial Nerves: Cranial nerves 2-12 are intact.     Sensory: Sensation is intact.     Motor: Motor function is intact.     Coordination: Coordination is intact.  Psychiatric:        Attention and Perception: Attention normal.        Mood and Affect: Mood normal.        Speech: Speech normal.        Behavior: Behavior normal.     ED Results / Procedures / Treatments   Labs (all labs ordered are listed, but only abnormal results are displayed) Labs Reviewed  BASIC METABOLIC PANEL - Abnormal; Notable for the following components:      Result Value   Sodium 134 (*)    Chloride 96 (*)     Glucose, Bld 441 (*)    BUN 38 (*)    Creatinine, Ser 1.33 (*)    GFR, Estimated 41 (*)    All other components within normal limits  TROPONIN I (HIGH SENSITIVITY) - Abnormal; Notable for the following components:   Troponin I (High Sensitivity) 7,526 (*)  All other components within normal limits  CBC  PROTIME-INR  APTT  HEMOGLOBIN A1C  LIPID PANEL  HEPATIC FUNCTION PANEL    EKG EKG Interpretation  Date/Time:  Wednesday January 12 2023 23:31:47 EDT Ventricular Rate:  82 PR Interval:  242 QRS Duration: 110 QT Interval:  388 QTC Calculation: 453 R Axis:   75 Text Interpretation: ** Critical Test Result: STEMI Sinus rhythm with 1st degree A-V block with occasional Premature ventricular complexes and Premature atrial complexes Incomplete left bundle branch block ST elevation consider inferior injury or acute infarct ** ** ACUTE MI / STEMI ** ** Consider right ventricular involvement in acute inferior infarct Abnormal ECG No previous ECGs available Confirmed by Orpah Greek [id number] on 01/12/2023 11:43:48 PM  Radiology DG Chest Port 1 View  Result Date: 01/12/2023 CLINICAL DATA:  [id number] with chest pain radiating to the upper extremities and back. EXAM: PORTABLE CHEST 1 VIEW COMPARISON:  None Available. FINDINGS: The heart is borderline prominent. There are sternotomy sutures. There is calcification in the aortic arch with unremarkable mediastinum. No vascular congestion is seen. The sulci are sharp. The lungs are clear. Osteopenia and thoracic spondylosis. Asymmetric right shoulder DJD. IMPRESSION: 1. No evidence of acute chest disease. 2. Borderline cardiomegaly.  Prior sternotomy. 3. Aortic atherosclerosis. Electronically Signed   By: Telford Nab M.D.   On: 01/12/2023 23:57    Procedures Procedures    Medications Ordered in ED Medications  0.9 %  sodium chloride infusion ( Intravenous New Bag/Given 01/12/23 2344)  aspirin chewable tablet 324 mg (324 mg Oral Given  01/12/23 2344)  heparin injection 4,000 Units (4,000 Units Intravenous Given 01/12/23 2343)  morphine (PF) 4 MG/ML injection 4 mg ( Intravenous Canceled Entry 01/12/23 2359)  ondansetron (ZOFRAN) injection 4 mg ( Intravenous Canceled Entry 01/12/23 2359)    ED Course/ Medical Decision Making/ A&P                             Medical Decision Making Amount and/or Complexity of Data Reviewed Independent Historian: spouse External Data Reviewed: ECG and notes. Labs: ordered. Decision-making details documented in ED Course. Radiology: ordered and independent interpretation performed. Decision-making details documented in ED Course. ECG/medicine tests: ordered and independent interpretation performed. Decision-making details documented in ED Course.  Risk OTC drugs. Prescription drug management.   Dents with acute onset chest pain.  Patient with known CAD.  EKG performed in triage concerning for inferior ST elevation MI.  Patient administered aspirin, heparin bolus.  Nitroglycerin held because of inferior territory.  Given morphine for pain control.  Code STEMI activated.  Discussed with Dr. Ellyn Hack on-call for interventional cardiology.  Patient will be transferred to [facility] for emergent heart catheterization.  CRITICAL CARE Performed by: Orpah Greek   Total critical care time: 35 minutes  Critical care time was exclusive of separately billable procedures and treating other patients.  Critical care was necessary to treat or prevent imminent or life-threatening deterioration.  Critical care was time spent personally by me on the following activities: development of treatment plan with patient and/or surrogate as well as nursing, discussions with consultants, evaluation of patient's response to treatment, examination of patient, obtaining history from patient or surrogate, ordering and performing treatments and interventions, ordering and review of laboratory studies, ordering and  review of radiographic studies, pulse oximetry and re-evaluation of patient's condition.         Final Clinical Impression(s) / ED Diagnoses Final  diagnoses:  ST elevation myocardial infarction (STEMI), unspecified artery    Rx / DC Orders ED Discharge Orders     None         Claudie Rathbone, Gwenyth Allegra, MD 01/13/23 [phone]

## 2023-01-12 NOTE — ED Triage Notes (Signed)
Pt c/o chest pain across width of her chest with radiation to both arms and back x 1 hour ago, pt reports similar symptoms intermittently over several days

## 2023-01-13 ENCOUNTER — Inpatient Hospital Stay (HOSPITAL_COMMUNITY): Payer: Medicare HMO

## 2023-01-13 ENCOUNTER — Encounter (HOSPITAL_COMMUNITY): Payer: Self-pay | Admitting: Cardiology

## 2023-01-13 ENCOUNTER — Encounter (HOSPITAL_COMMUNITY)
Admission: EM | Disposition: A | Discharge status: Home / Self Care | Payer: Self-pay | Source: Home / Self Care | Attending: Cardiology

## 2023-01-13 DIAGNOSIS — I5022 Chronic systolic (congestive) heart failure: Secondary | ICD-10-CM | POA: Diagnosis not present

## 2023-01-13 DIAGNOSIS — I255 Ischemic cardiomyopathy: Secondary | ICD-10-CM | POA: Diagnosis present

## 2023-01-13 DIAGNOSIS — Z87891 Personal history of nicotine dependence: Secondary | ICD-10-CM | POA: Diagnosis not present

## 2023-01-13 DIAGNOSIS — I5043 Acute on chronic combined systolic (congestive) and diastolic (congestive) heart failure: Secondary | ICD-10-CM | POA: Diagnosis not present

## 2023-01-13 DIAGNOSIS — Z79899 Other long term (current) drug therapy: Secondary | ICD-10-CM | POA: Diagnosis not present

## 2023-01-13 DIAGNOSIS — I25110 Atherosclerotic heart disease of native coronary artery with unstable angina pectoris: Secondary | ICD-10-CM | POA: Diagnosis not present

## 2023-01-13 DIAGNOSIS — I25709 Atherosclerosis of coronary artery bypass graft(s), unspecified, with unspecified angina pectoris: Secondary | ICD-10-CM | POA: Diagnosis present

## 2023-01-13 DIAGNOSIS — Z79890 Hormone replacement therapy (postmenopausal): Secondary | ICD-10-CM | POA: Diagnosis not present

## 2023-01-13 DIAGNOSIS — Z951 Presence of aortocoronary bypass graft: Secondary | ICD-10-CM

## 2023-01-13 DIAGNOSIS — I10 Essential (primary) hypertension: Secondary | ICD-10-CM

## 2023-01-13 DIAGNOSIS — I25810 Atherosclerosis of coronary artery bypass graft(s) without angina pectoris: Secondary | ICD-10-CM | POA: Diagnosis not present

## 2023-01-13 DIAGNOSIS — N189 Chronic kidney disease, unspecified: Secondary | ICD-10-CM | POA: Diagnosis not present

## 2023-01-13 DIAGNOSIS — I2119 ST elevation (STEMI) myocardial infarction involving other coronary artery of inferior wall: Secondary | ICD-10-CM

## 2023-01-13 DIAGNOSIS — E118 Type 2 diabetes mellitus with unspecified complications: Secondary | ICD-10-CM | POA: Diagnosis not present

## 2023-01-13 DIAGNOSIS — Z7982 Long term (current) use of aspirin: Secondary | ICD-10-CM | POA: Diagnosis not present

## 2023-01-13 DIAGNOSIS — E785 Hyperlipidemia, unspecified: Secondary | ICD-10-CM

## 2023-01-13 DIAGNOSIS — Z66 Do not resuscitate: Secondary | ICD-10-CM | POA: Diagnosis not present

## 2023-01-13 DIAGNOSIS — Z794 Long term (current) use of insulin: Secondary | ICD-10-CM | POA: Diagnosis not present

## 2023-01-13 DIAGNOSIS — E1169 Type 2 diabetes mellitus with other specified complication: Secondary | ICD-10-CM

## 2023-01-13 DIAGNOSIS — Z8249 Family history of ischemic heart disease and other diseases of the circulatory system: Secondary | ICD-10-CM | POA: Diagnosis not present

## 2023-01-13 DIAGNOSIS — Z888 Allergy status to other drugs, medicaments and biological substances status: Secondary | ICD-10-CM | POA: Diagnosis not present

## 2023-01-13 DIAGNOSIS — I447 Left bundle-branch block, unspecified: Secondary | ICD-10-CM | POA: Diagnosis present

## 2023-01-13 DIAGNOSIS — Z9189 Other specified personal risk factors, not elsewhere classified: Secondary | ICD-10-CM | POA: Diagnosis not present

## 2023-01-13 DIAGNOSIS — F32A Depression, unspecified: Secondary | ICD-10-CM | POA: Diagnosis not present

## 2023-01-13 DIAGNOSIS — I130 Hypertensive heart and chronic kidney disease with heart failure and stage 1 through stage 4 chronic kidney disease, or unspecified chronic kidney disease: Secondary | ICD-10-CM | POA: Diagnosis not present

## 2023-01-13 DIAGNOSIS — E1122 Type 2 diabetes mellitus with diabetic chronic kidney disease: Secondary | ICD-10-CM | POA: Diagnosis not present

## 2023-01-13 DIAGNOSIS — I2582 Chronic total occlusion of coronary artery: Secondary | ICD-10-CM | POA: Diagnosis present

## 2023-01-13 DIAGNOSIS — Z85828 Personal history of other malignant neoplasm of skin: Secondary | ICD-10-CM | POA: Diagnosis not present

## 2023-01-13 DIAGNOSIS — R079 Chest pain, unspecified: Secondary | ICD-10-CM | POA: Diagnosis present

## 2023-01-13 DIAGNOSIS — Z7984 Long term (current) use of oral hypoglycemic drugs: Secondary | ICD-10-CM | POA: Diagnosis not present

## 2023-01-13 DIAGNOSIS — I2510 Atherosclerotic heart disease of native coronary artery without angina pectoris: Secondary | ICD-10-CM | POA: Diagnosis present

## 2023-01-13 DIAGNOSIS — I252 Old myocardial infarction: Secondary | ICD-10-CM | POA: Diagnosis not present

## 2023-01-13 HISTORY — PX: CORONARY/GRAFT ACUTE MI REVASCULARIZATION: CATH118305

## 2023-01-13 HISTORY — PX: LEFT HEART CATH AND CORONARY ANGIOGRAPHY: CATH118249

## 2023-01-13 LAB — HEPATIC FUNCTION PANEL
ALT: 19 U/L (ref 0–44)
ALT: 20 U/L (ref 0–44)
AST: 46 U/L — ABNORMAL HIGH (ref 15–41)
AST: 46 U/L — ABNORMAL HIGH (ref 15–41)
Albumin: 3.5 g/dL (ref 3.5–5.0)
Albumin: 4.1 g/dL (ref 3.5–5.0)
Alkaline Phosphatase: 110 U/L (ref 38–126)
Alkaline Phosphatase: 95 U/L (ref 38–126)
Bilirubin, Direct: 0.1 mg/dL (ref 0.0–0.2)
Bilirubin, Direct: <0.1 mg/dL (ref 0.0–0.2)
Indirect Bilirubin: 0.4 mg/dL (ref 0.3–0.9)
Total Bilirubin: 0.4 mg/dL (ref 0.3–1.2)
Total Bilirubin: 0.5 mg/dL (ref 0.3–1.2)
Total Protein: 6.8 g/dL (ref 6.5–8.1)
Total Protein: 7.7 g/dL (ref 6.5–8.1)

## 2023-01-13 LAB — HEMOGLOBIN A1C
Hgb A1c MFr Bld: 9.4 % — ABNORMAL HIGH (ref 4.8–5.6)
Mean Plasma Glucose: 223 mg/dL

## 2023-01-13 LAB — BASIC METABOLIC PANEL
Anion gap: 12 (ref 5–15)
Anion gap: 13 (ref 5–15)
BUN: 33 mg/dL — ABNORMAL HIGH (ref 8–23)
BUN: 38 mg/dL — ABNORMAL HIGH (ref 8–23)
CO2: 25 mmol/L (ref 22–32)
CO2: 26 mmol/L (ref 22–32)
Calcium: 9 mg/dL (ref 8.9–10.3)
Calcium: 9.2 mg/dL (ref 8.9–10.3)
Chloride: 96 mmol/L — ABNORMAL LOW (ref 98–111)
Chloride: 97 mmol/L — ABNORMAL LOW (ref 98–111)
Creatinine, Ser: 1.26 mg/dL — ABNORMAL HIGH (ref 0.44–1.00)
Creatinine, Ser: 1.33 mg/dL — ABNORMAL HIGH (ref 0.44–1.00)
GFR, Estimated: 41 mL/min — ABNORMAL LOW (ref >60)
GFR, Estimated: 43 mL/min — ABNORMAL LOW (ref >60)
Glucose, Bld: 279 mg/dL — ABNORMAL HIGH (ref 70–99)
Glucose, Bld: 441 mg/dL — ABNORMAL HIGH (ref 70–99)
Potassium: 3.9 mmol/L (ref 3.5–5.1)
Potassium: 4.2 mmol/L (ref 3.5–5.1)
Sodium: 134 mmol/L — ABNORMAL LOW (ref 135–145)
Sodium: 135 mmol/L (ref 135–145)

## 2023-01-13 LAB — LIPID PANEL
Cholesterol: 242 mg/dL — ABNORMAL HIGH (ref 0–200)
HDL: 57 mg/dL (ref >40)
LDL Cholesterol: 133 mg/dL — ABNORMAL HIGH (ref 0–99)
Total CHOL/HDL Ratio: 4.2 RATIO
Triglycerides: 261 mg/dL — ABNORMAL HIGH (ref <150)
VLDL: 52 mg/dL — ABNORMAL HIGH (ref 0–40)

## 2023-01-13 LAB — GLUCOSE, CAPILLARY
Glucose-Capillary: 266 mg/dL — ABNORMAL HIGH (ref 70–99)
Glucose-Capillary: 260 mg/dL — ABNORMAL HIGH (ref 70–99)
Glucose-Capillary: 217 mg/dL — ABNORMAL HIGH (ref 70–99)
Glucose-Capillary: 186 mg/dL — ABNORMAL HIGH (ref 70–99)
Glucose-Capillary: 150 mg/dL — ABNORMAL HIGH (ref 70–99)

## 2023-01-13 LAB — TROPONIN I (HIGH SENSITIVITY)
Troponin I (High Sensitivity): 7526 ng/L (ref <18)
Troponin I (High Sensitivity): 7941 ng/L (ref <18)
Troponin I (High Sensitivity): 8056 ng/L (ref <18)

## 2023-01-13 LAB — CBC
HCT: 35.4 % — ABNORMAL LOW (ref 36.0–46.0)
Hemoglobin: 12.1 g/dL (ref 12.0–15.0)
MCH: 29.7 pg (ref 26.0–34.0)
MCHC: 34.2 g/dL (ref 30.0–36.0)
MCV: 87 fL (ref 80.0–100.0)
Platelets: 253 10*3/uL (ref 150–400)
RBC: 4.07 MIL/uL (ref 3.87–5.11)
RDW: 13.6 % (ref 11.5–15.5)
WBC: 8.7 10*3/uL (ref 4.0–10.5)
nRBC: 0 % (ref 0.0–0.2)

## 2023-01-13 LAB — HEPARIN LEVEL (UNFRACTIONATED): Heparin Unfractionated: 0.41 IU/mL (ref 0.30–0.70)

## 2023-01-13 LAB — MRSA NEXT GEN BY PCR, NASAL: MRSA by PCR Next Gen: NOT DETECTED

## 2023-01-13 LAB — POCT ACTIVATED CLOTTING TIME
Activated Clotting Time: 266 seconds
Activated Clotting Time: 347 seconds

## 2023-01-13 SURGERY — CORONARY/GRAFT ACUTE MI REVASCULARIZATION
Anesthesia: LOCAL

## 2023-01-13 MED ORDER — AZELASTINE HCL 0.1 % NA SOLN
1.0000 | Freq: Two times a day (BID) | NASAL | Status: DC
  Administered 2023-01-13 (×2): 1 via NASAL
  Filled 2023-01-13: qty 30

## 2023-01-13 MED ORDER — SODIUM CHLORIDE 0.9 % IV SOLN: INTRAVENOUS | Status: AC

## 2023-01-13 MED ORDER — LORATADINE 10 MG PO TABS
10.0000 mg | ORAL_TABLET | Freq: Every day | ORAL | Status: DC
  Administered 2023-01-13 – 2023-01-16 (×4): 10 mg via ORAL
  Filled 2023-01-13 (×4): qty 1

## 2023-01-13 MED ORDER — AMLODIPINE BESYLATE 5 MG PO TABS
2.5000 mg | ORAL_TABLET | Freq: Every day | ORAL | Status: DC
  Administered 2023-01-13: 2.5 mg via ORAL
  Filled 2023-01-13 (×2): qty 1

## 2023-01-13 MED ORDER — HEPARIN SODIUM (PORCINE) 1000 UNIT/ML IJ SOLN
INTRAMUSCULAR | Status: DC | PRN
  Administered 2023-01-13: 7000 [IU] via INTRAVENOUS

## 2023-01-13 MED ORDER — CLOPIDOGREL BISULFATE 75 MG PO TABS
75.0000 mg | ORAL_TABLET | Freq: Every day | ORAL | Status: DC
  Administered 2023-01-14 – 2023-01-16 (×3): 75 mg via ORAL
  Filled 2023-01-13 (×3): qty 1

## 2023-01-13 MED ORDER — PERFLUTREN LIPID MICROSPHERE
INTRAVENOUS | Status: AC
  Administered 2023-01-13: 2 mL
  Filled 2023-01-13: qty 10

## 2023-01-13 MED ORDER — IOHEXOL 350 MG/ML SOLN
INTRAVENOUS | Status: DC | PRN
  Administered 2023-01-13: 200 mL

## 2023-01-13 MED ORDER — EZETIMIBE 10 MG PO TABS
10.0000 mg | ORAL_TABLET | Freq: Every day | ORAL | Status: DC
  Administered 2023-01-13 – 2023-01-16 (×4): 10 mg via ORAL
  Filled 2023-01-13 (×4): qty 1

## 2023-01-13 MED ORDER — PERFLUTREN LIPID MICROSPHERE: 1.0000 mL | INTRAVENOUS | Status: AC | PRN

## 2023-01-13 MED ORDER — TICAGRELOR 90 MG PO TABS
ORAL_TABLET | ORAL | Status: AC
  Filled 2023-01-13: qty 2

## 2023-01-13 MED ORDER — INSULIN DETEMIR 100 UNIT/ML ~~LOC~~ SOLN
12.0000 [IU] | Freq: Every day | SUBCUTANEOUS | Status: DC
  Administered 2023-01-13 – 2023-01-15 (×3): 12 [IU] via SUBCUTANEOUS
  Filled 2023-01-13 (×4): qty 0.12

## 2023-01-13 MED ORDER — HYDRALAZINE HCL 20 MG/ML IJ SOLN: 10.0000 mg | INTRAMUSCULAR | Status: AC | PRN

## 2023-01-13 MED ORDER — FLUOXETINE HCL 40 MG PO CAPS: 40.0000 mg | ORAL_CAPSULE | Freq: Every day | ORAL | Status: DC

## 2023-01-13 MED ORDER — LEVOTHYROXINE SODIUM 50 MCG PO TABS
50.0000 ug | ORAL_TABLET | Freq: Every day | ORAL | Status: DC
  Administered 2023-01-13 – 2023-01-16 (×4): 50 ug via ORAL
  Filled 2023-01-13 (×5): qty 1

## 2023-01-13 MED ORDER — ASPIRIN 81 MG PO TBEC
81.0000 mg | DELAYED_RELEASE_TABLET | Freq: Every evening | ORAL | Status: DC
  Administered 2023-01-13 – 2023-01-15 (×3): 81 mg via ORAL
  Filled 2023-01-13 (×3): qty 1

## 2023-01-13 MED ORDER — ENALAPRIL MALEATE 2.5 MG PO TABS
5.0000 mg | ORAL_TABLET | Freq: Two times a day (BID) | ORAL | Status: AC
  Administered 2023-01-14 (×2): 5 mg via ORAL
  Filled 2023-01-13 (×2): qty 2

## 2023-01-13 MED ORDER — INSULIN ASPART 100 UNIT/ML IJ SOLN
0.0000 [IU] | Freq: Three times a day (TID) | INTRAMUSCULAR | Status: DC
  Administered 2023-01-13: 8 [IU] via SUBCUTANEOUS
  Administered 2023-01-13: 5 [IU] via SUBCUTANEOUS
  Administered 2023-01-13: 3 [IU] via SUBCUTANEOUS
  Administered 2023-01-14: 5 [IU] via SUBCUTANEOUS
  Administered 2023-01-14: 3 [IU] via SUBCUTANEOUS
  Administered 2023-01-14: 2 [IU] via SUBCUTANEOUS
  Administered 2023-01-15: 3 [IU] via SUBCUTANEOUS
  Administered 2023-01-15: 2 [IU] via SUBCUTANEOUS
  Administered 2023-01-15: 8 [IU] via SUBCUTANEOUS
  Administered 2023-01-16: 2 [IU] via SUBCUTANEOUS

## 2023-01-13 MED ORDER — FLUOXETINE HCL 20 MG PO CAPS
40.0000 mg | ORAL_CAPSULE | Freq: Every day | ORAL | Status: DC
  Administered 2023-01-13 – 2023-01-16 (×4): 40 mg via ORAL
  Filled 2023-01-13 (×4): qty 2

## 2023-01-13 MED ORDER — CLOPIDOGREL BISULFATE 75 MG PO TABS: 75.0000 mg | ORAL_TABLET | Freq: Every day | ORAL | Status: DC

## 2023-01-13 MED ORDER — LABETALOL HCL 5 MG/ML IV SOLN: 10.0000 mg | INTRAVENOUS | Status: AC | PRN

## 2023-01-13 MED ORDER — TICAGRELOR 90 MG PO TABS
ORAL_TABLET | ORAL | Status: DC | PRN
  Administered 2023-01-13: 180 mg via ORAL

## 2023-01-13 MED ORDER — SODIUM CHLORIDE 0.9% FLUSH
3.0000 mL | Freq: Two times a day (BID) | INTRAVENOUS | Status: DC
  Administered 2023-01-13 – 2023-01-16 (×7): 3 mL via INTRAVENOUS

## 2023-01-13 MED ORDER — HEPARIN (PORCINE) IN NACL 1000-0.9 UT/500ML-% IV SOLN
INTRAVENOUS | Status: DC | PRN
  Administered 2023-01-13 (×2): 500 mL

## 2023-01-13 MED ORDER — SALINE SPRAY 0.65 % NA SOLN: 1.0000 | NASAL | Status: DC | PRN

## 2023-01-13 MED ORDER — LIDOCAINE HCL (PF) 1 % IJ SOLN
INTRAMUSCULAR | Status: DC | PRN
  Administered 2023-01-13: 5 mL

## 2023-01-13 MED ORDER — METOPROLOL TARTRATE 25 MG PO TABS
25.0000 mg | ORAL_TABLET | Freq: Two times a day (BID) | ORAL | Status: DC
  Administered 2023-01-13 – 2023-01-15 (×6): 25 mg via ORAL
  Filled 2023-01-13 (×7): qty 1

## 2023-01-13 MED ORDER — SODIUM CHLORIDE 0.9% FLUSH: 3.0000 mL | INTRAVENOUS | Status: DC | PRN

## 2023-01-13 MED ORDER — METOPROLOL TARTRATE 5 MG/5ML IV SOLN
5.0000 mg | Freq: Once | INTRAVENOUS | Status: AC
  Administered 2023-01-13: 5 mg via INTRAVENOUS
  Filled 2023-01-13: qty 5

## 2023-01-13 MED ORDER — HEPARIN (PORCINE) 25000 UT/250ML-% IV SOLN
850.0000 [IU]/h | INTRAVENOUS | Status: DC
  Administered 2023-01-13: 850 [IU]/h via INTRAVENOUS
  Filled 2023-01-13: qty 250

## 2023-01-13 MED ORDER — ACETAMINOPHEN 325 MG PO TABS
650.0000 mg | ORAL_TABLET | ORAL | Status: DC | PRN
  Administered 2023-01-15: 650 mg via ORAL
  Filled 2023-01-13: qty 2

## 2023-01-13 MED ORDER — SODIUM CHLORIDE 0.9 % IV SOLN: 250.0000 mL | INTRAVENOUS | Status: DC | PRN

## 2023-01-13 MED ORDER — ONDANSETRON HCL 4 MG/2ML IJ SOLN: 4.0000 mg | Freq: Four times a day (QID) | INTRAMUSCULAR | Status: DC | PRN

## 2023-01-13 MED ORDER — LIDOCAINE HCL (PF) 1 % IJ SOLN
INTRAMUSCULAR | Status: AC
  Filled 2023-01-13: qty 30

## 2023-01-13 MED ORDER — CHLORHEXIDINE GLUCONATE CLOTH 2 % EX PADS
6.0000 | MEDICATED_PAD | Freq: Every day | CUTANEOUS | Status: DC
  Administered 2023-01-13 – 2023-01-16 (×4): 6 via TOPICAL

## 2023-01-13 MED ORDER — ORAL CARE MOUTH RINSE: 15.0000 mL | OROMUCOSAL | Status: DC | PRN

## 2023-01-13 MED ORDER — CLOPIDOGREL BISULFATE 300 MG PO TABS: 300.0000 mg | ORAL_TABLET | Freq: Once | ORAL | Status: DC

## 2023-01-13 MED ORDER — CLOPIDOGREL BISULFATE 300 MG PO TABS
300.0000 mg | ORAL_TABLET | Freq: Once | ORAL | Status: AC
  Administered 2023-01-13: 300 mg via ORAL
  Filled 2023-01-13: qty 1

## 2023-01-13 SURGICAL SUPPLY — 26 items
BALLN EMERGE MR 2.5X12 (BALLOONS) ×1
BALLN SAPPHIRE 1.0X10 (BALLOONS) ×1
BALLN SAPPHIRE 1.5X20 (BALLOONS) ×1
BALLOON EMERGE MR 2.5X12 (BALLOONS) IMPLANT
BALLOON SAPPHIRE 1.0X10 (BALLOONS) IMPLANT
BALLOON SAPPHIRE 1.5X20 (BALLOONS) IMPLANT
CATH INFINITI 5FR AL1 (CATHETERS) IMPLANT
CATH INFINITI 5FR MULTPACK ANG (CATHETERS) IMPLANT
CATH LAUNCHER 6FR AL1 (CATHETERS) IMPLANT
CATHETER LAUNCHER 6FR AL1 (CATHETERS) ×1
CLOSURE MYNX CONTROL 6F/7F (Vascular Products) IMPLANT
DEVICE SPIDERFX EMB PROT 4MM (WIRE) IMPLANT
ELECT DEFIB PAD ADLT CADENCE (PAD) IMPLANT
KIT ENCORE 26 ADVANTAGE (KITS) IMPLANT
KIT ESSENTIALS PG (KITS) IMPLANT
KIT HEART LEFT (KITS) ×1 IMPLANT
KIT MICROPUNCTURE NIT STIFF (SHEATH) IMPLANT
PACK CARDIAC CATHETERIZATION (CUSTOM PROCEDURE TRAY) ×1 IMPLANT
SHEATH PINNACLE 6F 10CM (SHEATH) IMPLANT
SHEATH PROBE COVER 6X72 (BAG) IMPLANT
SYR MEDRAD MARK 7 150ML (SYRINGE) IMPLANT
TRANSDUCER W/STOPCOCK (MISCELLANEOUS) ×1 IMPLANT
TUBING CIL FLEX 10 FLL-RA (TUBING) ×1 IMPLANT
TUBING CONTRAST HIGH PRESS 48 (TUBING) IMPLANT
WIRE ASAHI PROWATER 180CM (WIRE) IMPLANT
WIRE EMERALD 3MM-J .035X150CM (WIRE) IMPLANT

## 2023-01-13 NOTE — Progress Notes (Signed)
ANTICOAGULATION CONSULT NOTE - Initial Consult  Pharmacy Consult for Heparin  Indication: chest pain/ACS, s/p cath  Allergies  Allergen Reactions   Propoxyphene Nausea And Vomiting    Other reaction(s): Vomiting   Lidocaine-Epinephrine     Other reaction(s): Other (See Comments) palpitatins and chest pain   Other Other (See Comments)    sutures - in her hand-very irritated   Tape Rash    BANDAIDS. BANDAIDS   Xylocaine  [Lidocaine Hcl]     Other reaction(s): Other (See Comments) palpitatins and chest pain   Statins     Muscle pain and weakness in legs   Jardiance [Empagliflozin] Other (See Comments)    Recurrent vaginal infections    Patient Measurements: Height: 5\' 6"  (167.6 cm) Weight: 72.5 kg (159 lb 13.3 oz) IBW/kg (Calculated) : 59.3  Vital Signs: Temp: 98.1 F (36.7 C) (04/04 0300) Temp Source: Oral (04/04 0300) BP: 140/74 (04/04 0400) Pulse Rate: 67 (04/04 0400)  Labs: Recent Labs    01/12/23 2339 01/13/23 0315  HGB 12.4 12.1  HCT 38.2 35.4*  PLT 273 253  APTT 25  --   LABPROT 12.6  --   INR 1.0  --   CREATININE 1.33* 1.26*  TROPONINIHS 7,526* 8,056*    Estimated Creatinine Clearance: 36.9 mL/min (A) (by C-G formula based on SCr of 1.26 mg/dL (H)).   Medical History: Past Medical History:  Diagnosis Date   Anxiety    Cancer    melanoma face   Depression    Diabetes mellitus without complication    Fracture of tibial plateau 12/31/2015   Hyperlipidemia    Hypertension     Assessment: 77 y/o F s/p cath, unable to cross lesion in the cath lab, planning for heparin x 48 hours starting 8 hours after sheath removal, sheath was removed ~0230. Labs above reviewed.   Goal of Therapy:  Heparin level 0.3-0.7 units/ml Monitor platelets by anticoagulation protocol: Yes   Plan:  Start heparin drip at 850 units/hr at 1030 1830 heparin level Daily CBC and heparin level Monitor closely for bleeding  Narda Bonds, PharmD, BCPS Clinical  Pharmacist Phone: [phone]

## 2023-01-13 NOTE — Progress Notes (Addendum)
CARDIAC REHAB PHASE I   PRE:  Rate/Rhythm: 65 NSR  BP:  Sitting: 119/60      SaO2: 97 RA  MODE:  Ambulation: 295 ft   AD:  4WW  POST:  Rate/Rhythm: 133 ST  BP:  Sitting: 115/77      SaO2: 97 RA  Pt amb with contact guard assistance, helped pt get to BR to void prior to walking. Pt walked well using 4 wheel walker. Pt was returned to room and received ex guidelines, HH and diabetic diet sheet.   Christen Bame  2:14 PM 01/13/2023

## 2023-01-13 NOTE — ED Notes (Signed)
Critical Value Troponin of 7,256 Reported to EDP [place] at this time. No new orders. Pt en route to [facility]

## 2023-01-13 NOTE — Progress Notes (Signed)
2D echo attempted, patient unavailable per RN. Will try later

## 2023-01-13 NOTE — H&P (Signed)
Cardiology Admission History and Physical   Patient ID: Angela Rose MRN: [id number]; DOB: 1945-12-03   Admission date: 01/12/2023  PCP:  [name], [place] Providers Cardiologist:  Janina Mayo, MD   {  Chief Complaint: Inferior STEMI  Patient Profile:   Angela Rose is a 77 y.o. female former smoker with history of CAD (CABG x 3 in 1996), PAD/carotid disease, DM-2, HTN and HLD who is being seen 01/13/2023 for the evaluation of inferior ST elevation myocardial infarction.  History of Present Illness:   Angela Rose was most recently seen by Dr. Phineas Inches on July 06, 2022 mostly noting some allergy symptoms and may be some fatigue thought to be related to thyroid disease.  Lipids were not at goal as her Crestor was increased that she subsequently stopped Crestor and was on Zetia.  She had eventually ended up on Repatha.  She had been doing well, in her usual state of health relatively active until a few days ago.  She has been having intermittent episodes of substernal chest discomfort over the last couple days but on the evening of Wednesday, 01/12/2023, she had gotten up to get ready for bed after watching her routine shows with her husband, but sat back down again due to severe substernal chest discomfort radiating across the chest to both arms and back.  She notes mild dyspnea but no nausea or vomiting.  Had never really had a history of MI before but she knew that something was wrong.  She went to any pain emergency room via POV where she was seen by Dr. Betsey Holiday. Initial EKG showed bundle branch block with inferior ST elevations and code STEMI was called.  She was given 4000 units of IV heparin, 325 mg aspirin, 4 of Zofran and 4 morphine from the ER to arrival here.  She arrived to [facility] chest pain-free, and on her monitor her ST segment elevations were essentially resolved.  However based on presentation I chose to  proceed with cardiac catheterization.   Past Medical History:  Diagnosis Date   Anxiety    Cancer    melanoma face   Depression    Diabetes mellitus without complication    Fracture of tibial plateau 12/31/2015   Hyperlipidemia    Hypertension     Past Surgical History:  Procedure Laterality Date   ABDOMINAL HYSTERECTOMY     APPENDECTOMY     BASAL CELL CARCINOMA EXCISION     BREAST BIOPSY Right    needle bx-neg   CATARACT EXTRACTION, BILATERAL Bilateral 11/23/2018   CORONARY ARTERY BYPASS GRAFT  1996   x 3   MELANOMA EXCISION     PARTIAL HYSTERECTOMY  1976   ovaries remain     Medications Prior to Admission: Prior to Admission medications   Medication Sig Start Date End Date Taking? Authorizing Provider  amLODipine (NORVASC) 2.5 MG tablet Take 1 tablet (2.5 mg total) by mouth daily. 11/29/22 02/27/23  [name], FNP  aspirin 81 MG tablet Take by mouth every evening.     [provider]  azelastine (ASTELIN) 0.1 % nasal spray Place 1 spray into both nostrils 2 (two) times daily. Use in each nostril as directed 07/02/22   [name], FNP  Blood Glucose Monitoring Suppl DEVI 1 each by Does not apply route in the morning, at noon, and at bedtime. May substitute to any manufacturer covered by patient's insurance. 11/29/22   [name], [place]  cetirizine (ZYRTEC) 5 MG tablet Take 1 tablet (5 mg total) by mouth daily. 07/02/22   [name], FNP  enalapril (VASOTEC) 5 MG tablet Take 1 tablet (5 mg total) by mouth 2 (two) times daily. 11/29/22 02/27/23  [name], FNP  Evolocumab (REPATHA) 140 MG/ML SOSY Inject 140 mg into the skin every 14 (fourteen) days. 12/17/22   [name], FNP  ezetimibe (ZETIA) 10 MG tablet Take 1 tablet (10 mg total) by mouth daily. 02/10/22   Renee Rival, FNP  FLUoxetine (PROZAC) 40 MG capsule TAKE 1 CAPSULE BY MOUTH  DAILY 11/20/21   Glean Hess, MD  Glucose Blood (BLOOD GLUCOSE TEST STRIPS) STRP Use to check glucose  twice daily 12/28/22   Brita Romp, NP  glucose blood test strip Frequency:PHARMDIR   Dosage:0.0     Instructions:  Note:testing 3-4xqd, DX Code 250.00 Dose: 1 12/24/08   [provider]  hydrochlorothiazide (HYDRODIURIL) 25 MG tablet Take 1 tablet (25 mg total) by mouth daily. 11/29/22   [name], FNP  insulin detemir (LEVEMIR FLEXPEN) 100 UNIT/ML FlexPen Inject 16 Units into the skin at bedtime. 12/28/22   Brita Romp, NP  Insulin Pen Needle (BD PEN NEEDLE NANO U/F) 32G X 4 MM MISC Use to check glucose once daily 12/28/22   Brita Romp, NP  levothyroxine (SYNTHROID) 50 MCG tablet Take 1 tablet (50 mcg total) by mouth daily before breakfast. 12/28/22 12/23/23  Brita Romp, NP  metFORMIN (GLUCOPHAGE-XR) 500 MG 24 hr tablet Take 1 tablet (500 mg total) by mouth daily with breakfast. 12/28/22   Brita Romp, NP  sodium chloride (OCEAN) 0.65 % SOLN nasal spray Place 1 spray into both nostrils as needed for congestion. 11/09/21   Paseda, Dewaine Conger, FNP  Vitamin D, Ergocalciferol, (DRISDOL) 1.25 MG (50000 UNIT) CAPS capsule Take 1 capsule (50,000 Units total) by mouth every 7 (seven) days. 12/08/22   [name], FNP     Allergies:    Allergies  Allergen Reactions   Propoxyphene Nausea And Vomiting    Other reaction(s): Vomiting   Lidocaine-Epinephrine     Other reaction(s): Other (See Comments) palpitatins and chest pain   Other Other (See Comments)    sutures - in her hand-very irritated   Tape Rash    BANDAIDS. BANDAIDS   Xylocaine  [Lidocaine Hcl]     Other reaction(s): Other (See Comments) palpitatins and chest pain   Statins     Muscle pain and weakness in legs   Jardiance [Empagliflozin] Other (See Comments)    Recurrent vaginal infections    Social History:   Social History   Socioeconomic History   Marital status: Significant Other    Spouse name: Not on file   Number of children: 3   Years of education: Not on file   Highest  education level: Some college, no degree  Occupational History   Occupation: retired  Tobacco Use   Smoking status: Former    Types: Cigarettes    Quit date: 06/11/1982    Years since quitting: 40.6   Smokeless tobacco: Never   Tobacco comments:    smoking cessation materials not required  Vaping Use   Vaping Use: Never used  Substance and Sexual Activity   Alcohol use: Yes    Alcohol/week: 0.0 standard drinks of alcohol    Comment: rare 1 wine glass of wine   Drug use: No   Sexual activity: Not on file  Other Topics Concern  Not on file  Social History Narrative   Not on file   Social Determinants of Health   Financial Resource Strain: Low Risk  (12/14/2021)   Overall Financial Resource Strain (CARDIA)    Difficulty of Paying Living Expenses: Not hard at all  Food Insecurity: No Food Insecurity (12/14/2021)   Hunger Vital Sign    Worried About Running Out of Food in the Last Year: Never true    Ran Out of Food in the Last Year: Never true  Transportation Needs: No Transportation Needs (12/14/2021)   PRAPARE - Hydrologist (Medical): No    Lack of Transportation (Non-Medical): No  Physical Activity: Inactive (12/14/2021)   Exercise Vital Sign    Days of Exercise per Week: 0 days    Minutes of Exercise per Session: 0 min  Stress: Stress Concern Present (12/14/2021)   [place]    Feeling of Stress : To some extent  Social Connections: Moderately Isolated (12/14/2021)   Social Connection and Isolation Panel [NHANES]    Frequency of Communication with Friends and Family: More than three times a week    Frequency of Social Gatherings with Friends and Family: Once a week    Attends Religious Services: Never    Marine scientist or Organizations: No    Attends Archivist Meetings: Never    Marital Status: Living with partner  Intimate Partner Violence: Not At Risk (12/14/2021)    Humiliation, Afraid, Rape, and Kick questionnaire    Fear of Current or Ex-Partner: No    Emotionally Abused: No    Physically Abused: No    Sexually Abused: No    Family History:   The patient's family history includes Basal cell carcinoma in her mother; CAD in her father; Heart disease in her father; Skin cancer in her mother; Squamous cell carcinoma in her mother. There is no history of Breast cancer.    ROS:  Please see the history of present illness.  No fevers chills colds or sweats.  No melena, hematochezia, hematuria or epistaxis.\. No rapid irregular heartbeats or palpitations.  No syncope or near syncope.  No TIA or amaurosis fugax.   Mild nausea and dyspnea but no emesis. All other ROS reviewed and negative.     Physical Exam/Data:   Vitals:   01/12/23 2325 01/12/23 2356 01/12/23 2358 01/13/23 0056  BP: (!) 179/80 (!) 145/73    Pulse: 85 82 84   Resp: 17 15 16    Temp: 97.9 F (36.6 C)     TempSrc: Oral     SpO2: 100% 94% 97% 99%  Weight: 71.7 kg     Height: 5\' 6"  (1.676 m)      No intake or output data in the 24 hours ending 01/13/23 0058    01/12/2023   11:25 PM 12/28/2022    8:31 AM 12/08/2022    9:29 AM  Last 3 Weights  Weight (lbs) 158 lb 158 lb 9.6 oz 158 lb  Weight (kg) 71.668 kg 71.94 kg 71.668 kg     Body mass index is 25.5 kg/m.  General:  Well nourished, well developed elderly woman, actually, in no acute distress upon arrival HEENT: normal Neck: no carotid bruit or JVD Vascular: No carotid bruits; Distal pulses 2+ bilaterally   Cardiac:  RRR; normal S1, S2; cannot exclude soft 1/6 SEM. no rubs or gallops Lungs:  clear to auscultation bilaterally, no wheezing, rhonchi or  rales  Abd: soft, nontender, no hepatomegaly  Ext: no clubbing/cyanosis or edema Musculoskeletal:  No deformities, BUE and BLE strength normal and equal Skin: warm and dry  Neuro:  CNs 2-12 intact, no focal abnormalities noted Psych:  Normal affect    EKG:  The ECG that was  done at [facility] emergency room was personally reviewed and demonstrated NSR with 1 AV block.  With PACs and PVCs EKG changes consistent with acute inferior STEMI (injury pattern) with incomplete LBBB.  T waves were inverted in inferior leads reciprocal changes in 1, aVL and V2    Relevant CV Studies: Unfortunately not available  Laboratory Data:  High Sensitivity Troponin:   Recent Labs  Lab 01/12/23 2339  TROPONINIHS 7,526*      Chemistry Recent Labs  Lab 01/12/23 2339  NA 134*  K 3.9  CL 96*  CO2 26  GLUCOSE 441*  BUN 38*  CREATININE 1.33*  CALCIUM 9.2  GFRNONAA 41*  ANIONGAP 12    Recent Labs  Lab 01/12/23 2339  PROT 7.7  ALBUMIN 4.1  AST 46*  ALT 20  ALKPHOS 110  BILITOT 0.5   Lipids  Recent Labs  Lab 01/12/23 2339  CHOL 242*  TRIG 261*  HDL 57  LDLCALC 133*  CHOLHDL 4.2   Hematology Recent Labs  Lab 01/12/23 2339  WBC 9.0  RBC 4.26  HGB 12.4  HCT 38.2  MCV 89.7  MCH 29.1  MCHC 32.5  RDW 13.8  PLT 273   Thyroid No results for input(s): "TSH", "FREET4" in the last 168 hours. BNPNo results for input(s): "BNP", "PROBNP" in the last 168 hours.  DDimer No results for input(s): "DDIMER" in the last 168 hours.   Radiology/Studies:  DG Chest Port 1 View  Result Date: 01/12/2023 CLINICAL DATA:  [id number] with chest pain radiating to the upper extremities and back. EXAM: PORTABLE CHEST 1 VIEW COMPARISON:  None Available. FINDINGS: The heart is borderline prominent. There are sternotomy sutures. There is calcification in the aortic arch with unremarkable mediastinum. No vascular congestion is seen. The sulci are sharp. The lungs are clear. Osteopenia and thoracic spondylosis. Asymmetric right shoulder DJD. IMPRESSION: 1. No evidence of acute chest disease. 2. Borderline cardiomegaly.  Prior sternotomy. 3. Aortic atherosclerosis. Electronically Signed   By: Telford Nab M.D.   On: 01/12/2023 23:57     Assessment and Plan:   Principal Problem:    Acute ST elevation myocardial infarction (STEMI) of inferior wall Active Problems:   Coronary artery disease involving native coronary artery of native heart with unstable angina pectoris   History of colon polyps   Essential (primary) hypertension   Type II diabetes mellitus with complication   Hyperlipidemia associated with type 2 diabetes mellitus   Hx of CABG  Acute inferior STEMI with known CAD, unfortunately grafts and other anatomy not known. Emergent cardiac catheterization with possible PCI -> follow-up plans based on cath Check 2D echo following cath. Anticipate DAPT post cath. Check 2D echo, lipid panel and A1c Is on Repatha at home, continue rosuvastatin and Zetia. Not on beta-blocker at home, he is on calcium channel blocker and ACE inhibitor.  Will hold ACE inhibitor until Friday, 01/14/2023. She is diabetic, she is on Levemir insulin at home, will start with reduced dose.  Will likely need sliding scale during hospitalization.  Hold metformin Follow closely for potential signs of bleeding on DAPT.  More plans based on cath report   Risk Assessment/Risk Scores:    TIMI  Risk Score for ST  Elevation MI:   The patient's TIMI risk score is 4, which indicates a 7.3% risk of all cause mortality at 30 days.{     Severity of Illness: The appropriate patient status for this patient is OBSERVATION. Observation status is judged to be reasonable and necessary in order to provide the required intensity of service to ensure the patient's safety. The patient's presenting symptoms, physical exam findings, and initial radiographic and laboratory data in the context of their medical condition is felt to place them at decreased risk for further clinical deterioration. Furthermore, it is anticipated that the patient will be medically stable for discharge from the hospital within 2 midnights of admission.    For questions or updates, please contact [place] Please consult  [email or web address] for contact info under     Signed, Glenetta Hew, MD  01/13/2023 12:58 AM

## 2023-01-13 NOTE — Brief Op Note (Addendum)
01/13/2023  2:53 AM  PATIENT:  Angela Rose  77 y.o. female with history of CABG in 1996 (indicated that it was a three-vessel CABG), along with HTN, HLD and DM-2 follow-up with Dr. Phineas Inches.  Has been doing relatively well but the last couple days that she started having progressively worsening anginal chest pain until the point that it was severe enough today that she came into any pain emergency room.  She was noted to have inferior ST elevations.  She was treated with IV heparin, aspirin and morphine.  She is brought to [facility] as a code STEMI.  Upon arrival, she actually was chest pain-free and had almost complete resolution of inferior ST elevations. Based on her presentation I chose to proceed with cardiac catheterization.  PRE-OPERATIVE DIAGNOSIS: Inferior STEMI  POST-OPERATIVE DIAGNOSIS:   Severe native vessel disease with LAD CTO after SP1, RCA CTO proximally (RPDA fills via SP1 collaterals), mid LCx occlusion after small OM1. Patent LIMA-LAD which actually appears to have an sequential initial attachment to a diagonal branch prior to attaching to the LAD.  The distal LAD is small caliber with diffuse disease. There is a sequential SVG high of the aorta (difficult to find) that attaches to OM 2, and then appears to reach down to the distal RCA.  The mid body of the proximal limb has a 99% heavily calcified lesion that was not able to be crossed even with a 1.0 mm balloon (very difficult to wire).  There is a subsequent 80% calcified lesion shortly after this lesion and prior to the initial anastomosis.   Attempted PTCA: 6 [place] AL-1 guide catheter, Prowater wire which with much difficulty and balloon support was able to cross into the distal portion of the graft. => Initial attempts were made to cross with a 2.5 mm standard balloon, then downsized to 1.5 mm standard balloon and then finally a 1.0 mm standard balloon.  Angioplasty was performed with each of these in the  very proximal portion of the lesion but despite this, neither balloon was able to actually cross the lesion.  This would indicate that this is not the culprit lesion in fact the culprit segment is the downstream thrombotic segment from OM1 to distal RCA.  (This would explain inferior ST elevations. There is brisk flow distally into the first anastomosis of OM 2,  then beyond OM 2 there is a 90% stenosis followed by a long segment of hazy thrombotic occlusion with trace flow reaching the distal RCA but trivial flow in the PDA is a likely culprit lesion. Mildly reduced LVEF with basal to mid inferior akinesis/hypokinesis.  Relatively poor visualization.  EF apparently 40 to 45% with normal EDP.  PROCEDURE:  Procedure(s): Coronary/Graft Acute MI Revascularization (N/A) LEFT HEART CATH AND CORONARY ANGIOGRAPHY (N/A)  SURGEON:  Surgeon(s) and Role:    * Leonie Man, MD - Primary   Time Out: Verified patient identification, verified procedure, site/side was marked, verified correct patient position, special equipment/implants available, medications/allergies/relevent history reviewed, required imaging and test results available. Performed.  Access:  *Right Common Femoral Artery: 6 Fr Sheath - fluoroscopically guided modified Seldinger Technique  -- Direct ultrasound guidance used.  Permanent image obtained and placed on chart.  Left Heart Catheterization: 5 and 6 Fr Catheters advanced or exchanged over a J-wire under direct fluoroscopic guidance into the ascending aorta; JL 4 catheter advanced first.  * LV Hemodynamics (LV Gram): Angled pigtail catheter * Left Coronary Artery Cineangiography: JL 4 catheter  *  Right Coronary Artery, and LIMA-LAD Cineangiography: JR4 catheter (for LIMA, the catheter was directed into the left clavian artery and advanced over the standard J-wire to engage the IMA graft. SeqSVG-OM2-RCA- Cineangiography: Initially a JR4 catheter was used that was unsuccessful, I  then did a root angiogram with angled pigtail catheter and did not see any graft.  As a last effort I tried an AL-1 catheter which happened to engage the sequential graft.  For intervention, we used a 6 [place] AL-1 guide catheter.  Review of initial angiography revealed: Significant disease is noted-likely culprit is the distal portion of the vein graft, but the upstream lesions need to be crossed in order to treat that.  Preparations are made for attempted PCI on the SVG-OM 2-RCA -> Heparin boluses were administered to achieve and maintain an ACT of greater than 250 sec, Brilinta 180 mg p.o. administered  After multiple efforts to try to cross lesion with a Prowater wire, I advanced the 2.5 mm x 12 mm balloon to provide support and was eventually able to cross the lesion, however the 2.5 x 12 mm balloon would not cross the lesion.  I did inflate the balloon with the tip of the balloon and the lesion but still not able to pass.  Downsized to a 1.5 mm balloon that would not cross and then following to a 1.0 x 10 mm balloon that would still not cross.  After each of these balloons would not cross, I chose to abort the procedure.  Guidewire and balloons removed.  Guide catheter removed over wire.    Upon completion of Angiogaphy using a diagnostic AL-1 catheter,, the catheter was removed completely out of the body over a wire, without complication.  Femoral Sheath was removed in the Cath Lab with Mynx closure device deployed for hemostasis.  Short time of manual pressure for mild track use with excellent hemostasis.   MEDICATIONS Heparin: 7000 units  ANESTHESIA:   local and IV sedation; 14 mL SQ lidocaine (no side effects noted), 1 mg Versed, 25 mg fentanyl  EBL:  <50 mLCOUNTS:  YES   DICTATION: Standard note through [place]: Admit to inpatient  -> will start heparin to run 48 hours beginning 8 hours after sheath removal.  Will need to plan medical management of basically occluded  distal leg of the vein graft and unable to cross the more proximal limb. Will treat with Brilinta today and then convert on Friday, 01/14/2023 to Plavix treating with 300 mg x 1 and then on 01/15/2023 75 mg daily. Will check lipid panel, A1c and LP(a)-has statin intolerance, is on PCSK9 inhibitor at baseline, this will be continued at discharge. Will hold ACE inhibitor today and restart on 01/14/2023.  Hold HCTZ during hospitalization and continue amlodipine. Monitor heart rate to see if she can tolerate beta-blocker.  PATIENT DISPOSITION:  ICU - extubated and stable.    Glenetta Hew, MD

## 2023-01-13 NOTE — Progress Notes (Signed)
ANTICOAGULATION CONSULT NOTE - Initial Consult  Pharmacy Consult for Heparin  Indication: chest pain/ACS, s/p cath  Allergies  Allergen Reactions   Propoxyphene Nausea And Vomiting    Other reaction(s): Vomiting   Lidocaine-Epinephrine     Other reaction(s): Other (See Comments) palpitatins and chest pain   Other Other (See Comments)    sutures - in her hand-very irritated   Tape Rash    BANDAIDS. BANDAIDS   Xylocaine  [Lidocaine Hcl]     Other reaction(s): Other (See Comments) palpitatins and chest pain   Statins     Muscle pain and weakness in legs   Jardiance [Empagliflozin] Other (See Comments)    Recurrent vaginal infections    Patient Measurements: Height: 5\' 6"  (167.6 cm) Weight: 72.5 kg (159 lb 13.3 oz) IBW/kg (Calculated) : 59.3  Vital Signs: Temp: 97.8 F (36.6 C) (04/04 1614) Temp Source: Oral (04/04 1614) BP: 102/65 (04/04 1900) Pulse Rate: 68 (04/04 1900)  Labs: Recent Labs    01/12/23 2339 01/13/23 0315 01/13/23 0639 01/13/23 1810  HGB 12.4 12.1  --   --   HCT 38.2 35.4*  --   --   PLT 273 253  --   --   APTT 25  --   --   --   LABPROT 12.6  --   --   --   INR 1.0  --   --   --   HEPARINUNFRC  --   --   --  0.41  CREATININE 1.33* 1.26*  --   --   TROPONINIHS 7,526* 8,056* 7,941*  --      Estimated Creatinine Clearance: 36.9 mL/min (A) (by C-G formula based on SCr of 1.26 mg/dL (H)).   Medical History: Past Medical History:  Diagnosis Date   Anxiety    Cancer    melanoma face   Depression    Diabetes mellitus without complication    Fracture of tibial plateau 12/31/2015   Hyperlipidemia    Hypertension     Assessment: 77 y/o F s/p cath, unable to cross lesion in the cath lab, planning for heparin x 48 hours starting 8 hours after sheath removal, sheath was removed ~0230. Labs above reviewed.   Heparin level came back therapeutic. Plan to cont for 48 hrs. We will check repeat level in AM.   Goal of Therapy:  Heparin level  0.3-0.7 units/ml Monitor platelets by anticoagulation protocol: Yes   Plan:  Cont heparin drip at 850 units/hr Daily CBC and heparin level Monitor closely for bleeding  Onnie Boer, PharmD, BCIDP, AAHIVP, CPP Infectious Disease Pharmacist 01/13/2023 7:17 PM

## 2023-01-13 NOTE — Progress Notes (Signed)
  Echocardiogram 2D Echocardiogram has been performed.  [name] 01/13/2023, 3:23 PM

## 2023-01-13 NOTE — Progress Notes (Addendum)
Rounding Note    Patient Name: Angela Rose Date of Encounter: 01/13/2023  [place] Cardiologist: Janina Mayo, MD   Subjective   Doing alright this morning.  Has not had any anginal chest pain since arrival to the Cath Lab.  Discussed with her that she is not candidate for PCI due to anatomy and extensive disease. She voices understanding. Discussed that she will have stable angina moving forward and she will need to adjust her expectations of what her heart can handle. We will treat her medically and monitor for now.   Inpatient Medications    Scheduled Meds:  amLODipine  2.5 mg Oral Daily   aspirin EC  81 mg Oral QPM   azelastine  1 spray Each Nare BID   Chlorhexidine Gluconate Cloth  6 each Topical Daily   clopidogrel  300 mg Oral Once   [START ON 01/14/2023] clopidogrel  75 mg Oral Daily   [START ON 01/14/2023] enalapril  5 mg Oral BID   ezetimibe  10 mg Oral Daily   FLUoxetine  40 mg Oral Daily   insulin aspart  0-15 Units Subcutaneous TID WC   insulin detemir  12 Units Subcutaneous QHS   levothyroxine  50 mcg Oral QAC breakfast   loratadine  10 mg Oral Daily   metoprolol tartrate  25 mg Oral BID   sodium chloride flush  3 mL Intravenous Q12H   Continuous Infusions:  sodium chloride     heparin 850 Units/hr (01/13/23 1300)   PRN Meds: sodium chloride, acetaminophen, ondansetron (ZOFRAN) IV, mouth rinse, perflutren lipid microspheres (DEFINITY) IV suspension, sodium chloride, sodium chloride flush   Vital Signs    Vitals:   01/13/23 1121 01/13/23 1200 01/13/23 1300 01/13/23 1400  BP:  (!) 117/58 119/60 115/77  Pulse:  61 63 67  Resp:  20 (!) 21 17  Temp: 97.6 F (36.4 C)     TempSrc: Oral     SpO2:  99% 95% 95%  Weight:      Height:        Intake/Output Summary (Last 24 hours) at 01/13/2023 1603 Last data filed at 01/13/2023 1300 Gross per 24 hour  Intake 790.72 ml  Output 403 ml  Net 387.72 ml      01/13/2023    3:00 AM 01/12/2023    11:25 PM 12/28/2022    8:31 AM  Last 3 Weights  Weight (lbs) 159 lb 13.3 oz 158 lb 158 lb 9.6 oz  Weight (kg) 72.5 kg 71.668 kg 71.94 kg      Telemetry    Frequent PVCs - Personally Reviewed  ECG    Resolved ST elevations, frequent PVCs - Personally Reviewed  Physical Exam   GEN: No acute distress.   Neck: No JVD Cardiac: RRR, normal S1 and S2, possible 1/6 murmur, no LEE Respiratory: Clear to auscultation bilaterally. Breathing comfortably on RA GI: Soft, nontender, non-distended  MS: No edema; No deformity. Neuro:  Nonfocal, alert and oriented  Psych: Normal affect   Labs    High Sensitivity Troponin:   Recent Labs  Lab 01/12/23 2339 01/13/23 0315 01/13/23 0639  TROPONINIHS 7,526* 8,056* 7,941*     Chemistry Recent Labs  Lab 01/12/23 2339 01/13/23 0315  NA 134* 135  K 3.9 4.2  CL 96* 97*  CO2 26 25  GLUCOSE 441* 279*  BUN 38* 33*  CREATININE 1.33* 1.26*  CALCIUM 9.2 9.0  PROT 7.7 6.8  ALBUMIN 4.1 3.5  AST 46* 46*  ALT 20 19  ALKPHOS 110 95  BILITOT 0.5 0.4  GFRNONAA 41* 43*  ANIONGAP 12 13    Lipids  Recent Labs  Lab 01/12/23 2339  CHOL 242*  TRIG 261*  HDL 57  LDLCALC 133*  CHOLHDL 4.2    Hematology Recent Labs  Lab 01/12/23 2339 01/13/23 0315  WBC 9.0 8.7  RBC 4.26 4.07  HGB 12.4 12.1  HCT 38.2 35.4*  MCV 89.7 87.0  MCH 29.1 29.7  MCHC 32.5 34.2  RDW 13.8 13.6  PLT 273 253   Thyroid No results for input(s): "TSH", "FREET4" in the last 168 hours.  BNPNo results for input(s): "BNP", "PROBNP" in the last 168 hours.  DDimer No results for input(s): "DDIMER" in the last 168 hours.   Radiology    CARDIAC CATHETERIZATION  Result Date: 01/13/2023   Dist LM to Ost LAD lesion is 90% stenosed.  Prox LAD to Mid LAD lesion is 100% stenosed (after SP1).   LIMA-(D2)-LAD graft was visualized by angiography and is moderate in size.  The graft exhibits no disease.   Mid Cx lesion is 100% stenosed.   Prox RCA to Dist RCA lesion is 100%  stenosed.   Seq SVG- Sequential SVG-OM2-distal\ RCA graft was visualized by angiography and is large.   Mid Graft lesion before 2nd Mrg  is 99% stenosed. -> Balloon angioplasty was performed using a BALLN SAPPHIRE 1.5X20. & Balloon angioplasty was performed using a BALLN SAPPHIRE 1.0X10. = Neither balloon was able to cross the lesion.  Post intervention, there is a 95% residual stenosis.   Dist Graft lesion before 2nd Mrg  is 80% stenosed.  Remainder of the proximal limb is free of disease until the first anastomosis   First anastomosis is to the 2nd Mrg lesion-> retrograde flow reveals 65% stenosed proximal to SVG insertion site.   Prox Graft lesion between 2nd Mrg and Dist RCA  is 90% stenosed.  Prox Graft to Insertion lesion between 2nd Mrg and Dist RCA is 80% stenosed-diffuse thrombus beyond the 90% stenosis.   There is moderate left ventricular systolic dysfunction.   LV end diastolic pressure is normal.   The left ventricular ejection fraction is 35-45% by visual estimate. Post-Cath Diagnoses: 99% proximal sequential SVG-OM2-d RCA: The culprit lesion appears to be the downstream segment of the sequential vein graft, however unable to cross the upstream 99% in the first leg even with his 1.0 mm balloon.  Likely no percutaneous options. Severe native disease with near ostial LAD occlusion after SP1, as well as proximal RCA, mid LCx occlusion Moderate to mildly reduced EF with regional wall motion abnormalities suggesting basal to mid inferior hypo to akinesis and then inferoapical hypokinesis. Normal LVEDP PLAN OF CARE: Admit to inpatient  -> will start heparin to run 48 hours beginning 8 hours after sheath removal.  Will need to plan medical management of basically occluded distal leg of the vein graft and unable to cross the more proximal limb. Will treat with Brilinta today and then convert on Friday, 01/14/2023 to Plavix treating with 300 mg x 1 and then on 01/15/2023 75 mg daily. Will check lipid panel, A1c and  LP(a)-has statin intolerance, is on PCSK9 inhibitor at baseline, this will be continued at discharge. Will hold ACE inhibitor today and restart on 01/14/2023.  Hold HCTZ during hospitalization and continue amlodipine. Monitor heart rate to see if she can tolerate beta-blocker.   DG Chest Port 1 View  Result Date: 01/12/2023 CLINICAL DATA:  [phone]  with chest pain radiating to the upper extremities and back. EXAM: PORTABLE CHEST 1 VIEW COMPARISON:  None Available. FINDINGS: The heart is borderline prominent. There are sternotomy sutures. There is calcification in the aortic arch with unremarkable mediastinum. No vascular congestion is seen. The sulci are sharp. The lungs are clear. Osteopenia and thoracic spondylosis. Asymmetric right shoulder DJD. IMPRESSION: 1. No evidence of acute chest disease. 2. Borderline cardiomegaly.  Prior sternotomy. 3. Aortic atherosclerosis. Electronically Signed   By: Telford Nab M.D.   On: 01/12/2023 23:57    Cardiac Studies   ECHO pending  01/13/23 Cardiac cath: dLM-ost LAD 90%, pLAD-mLAD (after SP1) 100% CTO.   mLCx 100% CTO after OM1    Prox RCA to Dist RCA lesion is 100% stenosed.    LIMA-(D2)-LAD normal / small dLAD.   Seq SVG-OM2-distal\ RCA graft .> 90% focal eccentric heavily calcified (unable to cross with 1.0 mm balloon) followed by 80% stenosis (similar-appearing)   First anastomosis is to the 2nd Mrg lesion-> retrograde flow reveals 65% stenosed proximal to SVG ,   90% focal concentric lesion after OM2 anastomosis diffuse 8085% thrombotic occlusion leading to the insertion at distal RCA.    Moderate reduced EF of 35 to 40% with normal EDP.    Post-Cath Diagnoses: 99% proximal sequential SVG-OM2-d RCA: The culprit lesion appears to be the downstream segment of the sequential vein graft, however unable to cross the upstream 99% in the first leg even with his 1.0 mm balloon.  Likely no percutaneous options.     Patient Profile     77 y.o. female former smoker  with history of CAD (CABG x 3 in 1996), PAD/carotid disease, DM-2, HTN and HLD who is being seen 01/13/2023 for the evaluation of inferior ST elevation myocardial infarction   Assessment & Plan    Principal Problem:   Acute ST elevation myocardial infarction (STEMI) of inferior wall Active Problems:   Coronary artery disease involving native coronary artery of native heart with unstable angina pectoris   Coronary artery disease involving coronary bypass graft of native heart with angina pectoris   Hx of CABG   Type II diabetes mellitus with complication   Hyperlipidemia associated with type 2 diabetes mellitus   Essential (primary) hypertension   History of colon polyps   Acute inferior STEMI STEMI CAD s/p prior CABG - > CAD with native vessel and graft disease. She has diffuse severe disease on cath. She has LIMA and prior vein graft. The culprit lesion is distal end of vein graft with proximal stenosis that cannot be crossed. This is not amenable to intervention. She will be treated medically. LIMA predominant vascular supply for her heart. ECHO pending Since she was not able to have intervention, we will need to monitor her in the hospital couple days while heart recovers. - Heparin to start 8 hours after sheath removal. Run for 48 hours EKG now with PVCs, ST elevation resolving. -> will add Lopressor 25 mg BID - Brilinta today -but with no PCI, will convert to Plavix this evening after 300 mg and then 75 mg daily tomorrow. History of colon polyps, okay to hold for signs of.  Also if necessary for biopsies. - aspirin 81 mg daily - on repatha at. Restarted Zetia.  Will discuss potential for Librexia Trial (factor XI antagonist)  HFrEF - ischemic RWMA noted on cath suggesting basal to mid inferior hypo to akinesis and then inferoapical hypokinesis. Estimated between 35-45%. ECHO is pending (appears the EF may  be closer to 40 and 45%). - lopressor 25 BID - can try to add additional GDMT  as tolerated, though likely not much long term benefit. She does have diabetes so SGLT2i may be considered.   HLD Ldl 133, very poorly controlled Lpa pending - statin intolerance, is on PCSK9 inhibitor at baseline  -?  Compliance? - zetia resumed  DM2 - recent 9.3 A1c, repeat pending - ssi and levemir 12 QHS here (return to home dosing on discharge), holding metformin - consider SGLT2i and GLP1  HTN - continue amlodipine along with newly added beta-blocker. - ace being held since she received contrast, can resume tomorrow.   For questions or updates, please contact [place] Please consult [email or web address] for contact info under        Signed, Delene Ruffini, MD (Internal Medicine Resident Physician) 01/13/2023, 4:03 PM     ATTENDING ATTESTATION  I have seen, examined and evaluated the patient this morning along with resident physician.  After reviewing all the available data and chart, we discussed the patients laboratory, study & physical findings as well as symptoms in detail.  I agree with his findings, examination as well as impression recommendations as per our discussion.    Attending adjustments noted in italics.   She is a 19-year-old woman 28 years out from CABG now with essentially medical management of inferior STEMI due to subtotal occlusion of the sequential limb of the vein graft with severe upstream vein graft disease but also compromises the graft to the OM branch.  Currently asymptomatic.  Admitted earlier this morning with inferior STEMI found to have significant graft disease but not amenable to PCI.  As such, really the LIMA to LAD is less remaining conduits.  Thankfully EF seems to be relatively well-preserved.  Much of our medical management will be dependent upon the results of the echocardiogram which was still not available even later in the day.  With diabetes I agree with at a minimum SGLT2 inhibitor especially as she likely will have reduced  EF.  Seems euvolemic and therefore does not need diuretic. Adding beta-blocker-CAD and ectopy.  But heart rate will tolerate higher dose.     Leonie Man, MD, MS Glenetta Hew, M.D., M.S. Interventional Cardiologist  [place]  Pager # [phone] Phone # [phone] [address] [place], [place] [address]

## 2023-01-13 NOTE — TOC CM/SW Note (Signed)
..   Transition of Care [facility]) Screening Note   Patient Details  Name: Angela Rose Date of Birth: 11/15/1945   Transition of Care [facility]) CM/SW Contact:    Erenest Rasher, RN Phone Number: 01/13/2023, 11:56 AM    Transition of Care Department [facility]) has reviewed patient. We will continue to monitor patient advancement through interdisciplinary progression rounds. Will continue to follow for dc needs.

## 2023-01-13 NOTE — Progress Notes (Signed)
Discussed restrictions, lipid panel, A1C, importance of CRP2 and light activity. Pt received MI book. Will f/u to ambulate later.   Christen Bame 01/13/2023 12:42 PM

## 2023-01-13 NOTE — ED Notes (Signed)
Ems arrived

## 2023-01-14 ENCOUNTER — Other Ambulatory Visit (HOSPITAL_COMMUNITY): Payer: Self-pay

## 2023-01-14 DIAGNOSIS — I255 Ischemic cardiomyopathy: Secondary | ICD-10-CM | POA: Diagnosis present

## 2023-01-14 DIAGNOSIS — Z951 Presence of aortocoronary bypass graft: Secondary | ICD-10-CM | POA: Diagnosis not present

## 2023-01-14 DIAGNOSIS — I25110 Atherosclerotic heart disease of native coronary artery with unstable angina pectoris: Secondary | ICD-10-CM | POA: Diagnosis not present

## 2023-01-14 DIAGNOSIS — I5043 Acute on chronic combined systolic (congestive) and diastolic (congestive) heart failure: Secondary | ICD-10-CM

## 2023-01-14 DIAGNOSIS — E1169 Type 2 diabetes mellitus with other specified complication: Secondary | ICD-10-CM | POA: Diagnosis not present

## 2023-01-14 DIAGNOSIS — Z9189 Other specified personal risk factors, not elsewhere classified: Secondary | ICD-10-CM

## 2023-01-14 DIAGNOSIS — I10 Essential (primary) hypertension: Secondary | ICD-10-CM | POA: Diagnosis not present

## 2023-01-14 HISTORY — DX: Ischemic cardiomyopathy: I25.5

## 2023-01-14 HISTORY — DX: Acute on chronic combined systolic (congestive) and diastolic (congestive) heart failure: I50.43

## 2023-01-14 LAB — LIPOPROTEIN A (LPA): Lipoprotein (a): 201.1 nmol/L — ABNORMAL HIGH (ref <75.0)

## 2023-01-14 LAB — ECHOCARDIOGRAM COMPLETE
Area-P 1/2: 3.31 cm2
Calc EF: 33.1 %
Height: 66 in
S' Lateral: 3.7 cm
Single Plane A2C EF: 26.1 %
Single Plane A4C EF: 38.8 %
Weight: 2557.34 oz

## 2023-01-14 LAB — CBC
HCT: 32.3 % — ABNORMAL LOW (ref 36.0–46.0)
Hemoglobin: 10.9 g/dL — ABNORMAL LOW (ref 12.0–15.0)
MCH: 29.5 pg (ref 26.0–34.0)
MCHC: 33.7 g/dL (ref 30.0–36.0)
MCV: 87.5 fL (ref 80.0–100.0)
Platelets: 240 10*3/uL (ref 150–400)
RBC: 3.69 MIL/uL — ABNORMAL LOW (ref 3.87–5.11)
RDW: 13.6 % (ref 11.5–15.5)
WBC: 7 10*3/uL (ref 4.0–10.5)
nRBC: 0 % (ref 0.0–0.2)

## 2023-01-14 LAB — BASIC METABOLIC PANEL
Anion gap: 12 (ref 5–15)
BUN: 23 mg/dL (ref 8–23)
CO2: 24 mmol/L (ref 22–32)
Calcium: 8.5 mg/dL — ABNORMAL LOW (ref 8.9–10.3)
Chloride: 100 mmol/L (ref 98–111)
Creatinine, Ser: 1.2 mg/dL — ABNORMAL HIGH (ref 0.44–1.00)
GFR, Estimated: 46 mL/min — ABNORMAL LOW (ref >60)
Glucose, Bld: 163 mg/dL — ABNORMAL HIGH (ref 70–99)
Potassium: 4 mmol/L (ref 3.5–5.1)
Sodium: 136 mmol/L (ref 135–145)

## 2023-01-14 LAB — GLUCOSE, CAPILLARY
Glucose-Capillary: 159 mg/dL — ABNORMAL HIGH (ref 70–99)
Glucose-Capillary: 203 mg/dL — ABNORMAL HIGH (ref 70–99)
Glucose-Capillary: 134 mg/dL — ABNORMAL HIGH (ref 70–99)
Glucose-Capillary: 148 mg/dL — ABNORMAL HIGH (ref 70–99)

## 2023-01-14 LAB — HEPARIN LEVEL (UNFRACTIONATED): Heparin Unfractionated: 0.44 IU/mL (ref 0.30–0.70)

## 2023-01-14 MED ORDER — ENOXAPARIN SODIUM 80 MG/0.8ML IJ SOSY
70.0000 mg | PREFILLED_SYRINGE | Freq: Two times a day (BID) | INTRAMUSCULAR | Status: AC
  Administered 2023-01-14 (×2): 70 mg via SUBCUTANEOUS
  Filled 2023-01-14 (×2): qty 0.7

## 2023-01-14 MED ORDER — LOSARTAN POTASSIUM 25 MG PO TABS
25.0000 mg | ORAL_TABLET | Freq: Every day | ORAL | Status: DC
  Administered 2023-01-15 – 2023-01-16 (×2): 25 mg via ORAL
  Filled 2023-01-14 (×2): qty 1

## 2023-01-14 MED ORDER — DAPAGLIFLOZIN PROPANEDIOL 10 MG PO TABS
10.0000 mg | ORAL_TABLET | Freq: Every day | ORAL | Status: DC
  Administered 2023-01-14 – 2023-01-16 (×3): 10 mg via ORAL
  Filled 2023-01-14 (×3): qty 1

## 2023-01-14 NOTE — TOC Progression Note (Signed)
Transition of Care [facility]) - Progression Note    Patient Details  Name: Angela Rose MRN: [id number] Date of Birth: 18-Jun-1946  Transition of Care [facility]) CM/SW Contact  Elliot Cousin, RN Phone Number: [phone] 01/14/2023, 4:15 PM  Clinical Narrative:     Received referral for [employer] V[employer]. Referral faxed to [employer] rep, [place] Citizen. Spoke to pt and she is agreeable to [employer]. Waiting insurance approval. Once received [employer] will fit pt prior to dc home.   Expected Discharge Plan: Home/Self Care Barriers to Discharge: Continued Medical Work up  Expected Discharge Plan and Services   Discharge Planning Services: CM Consult   Living arrangements for the past 2 months: Single Family Home                 DME Arranged: Life vest DME Agency: [employer] Date DME Agency Contacted: 01/14/23 Time DME Agency Contacted: [phone] Representative spoke with at DME Agency: Luberta Robertson             Social Determinants of Health (SDOH) Interventions SDOH Screenings   Food Insecurity: No Food Insecurity (01/13/2023)  Housing: Low Risk  (01/13/2023)  Transportation Needs: No Transportation Needs (01/13/2023)  Utilities: Not At Risk (01/13/2023)  Alcohol Screen: Low Risk  (01/14/2023)  Depression (PHQ2-9): Low Risk  (01/05/2023)  Financial Resource Strain: Low Risk  (01/14/2023)  Physical Activity: Inactive (12/14/2021)  Social Connections: Moderately Isolated (12/14/2021)  Stress: Stress Concern Present (12/14/2021)  Tobacco Use: Medium Risk (01/13/2023)    Readmission Risk Interventions     No data to display

## 2023-01-14 NOTE — Progress Notes (Signed)
CARDIAC REHAB PHASE I   PRE:  Rate/Rhythm: 58 SB    BP: sitting 117/59    SpO2: 99 RA  MODE:  Ambulation: 380 ft   POST:  Rate/Rhythm: 70 SR    BP: sitting 145/58     SpO2: 97 RA  Steady, slow pace, contact guard assist. Denied angina, c/o leg achiness (she sts due to statin). To recliner again. Reviewed ed and gave exercise guidelines again.  [id number]   Ethelda Chick BS, ACSM-CEP 01/14/2023 1:13 PM

## 2023-01-14 NOTE — Progress Notes (Addendum)
Rounding Note    Patient Name: Angela Rose Date of Encounter: 01/14/2023  [facility] Cardiologist: Maisie Fus, MD   Subjective   Patient is interested in librexa trial. She is on other trial at [facility]. Discussed that we did not place stent, but she only has one remaining artery. She lost pump function on ECHO, discussed dropping amlodipine off. A BB we started yesterday. Could switch from enalapril in the future. She says she tolerated ambulation and not having any CP.  Goal is get up and moving, see how she does with med change, move fromm ICU, get meds organized and potenial DC later this weekend.  Discussed that with her EF, she is higher risk for unstable heart rhythms. Did mention that defibrillator would potentially be option in the future. Pump function may improve if just stunned, but may not. Could do life vest in meantime while making med adjustments. Graft may go down next couple years. Goal to keep quality of life up and help the heart do the best it can with what its got. We will discuss in more detail later today DNR/DNI.   Upon later discussion with patient and multiple family members at bedside including husband and two sons, patient did voice that she would like to pursue treatment to prolong life. She wants GDMT and the life vest/ICD. Also discussed that given her advanced heart disease, code status discussions are necessary. Patient and family voiced that although they are not ready to make decision at this time, they do understand she has quite advanced disease. They agree to continue discussing this and will be having continued discussions on the outpatient setting.   Inpatient Medications    Scheduled Meds:  aspirin EC  81 mg Oral QPM   azelastine  1 spray Each Nare BID   Chlorhexidine Gluconate Cloth  6 each Topical Daily   clopidogrel  75 mg Oral Daily   dapagliflozin propanediol  10 mg Oral Daily   enalapril  5 mg Oral BID   enoxaparin  (LOVENOX) injection  70 mg Subcutaneous BID   ezetimibe  10 mg Oral Daily   FLUoxetine  40 mg Oral Daily   insulin aspart  0-15 Units Subcutaneous TID WC   insulin detemir  12 Units Subcutaneous QHS   levothyroxine  50 mcg Oral QAC breakfast   loratadine  10 mg Oral Daily   [START ON 01/15/2023] losartan  25 mg Oral Daily   metoprolol tartrate  25 mg Oral BID   sodium chloride flush  3 mL Intravenous Q12H   Continuous Infusions:  sodium chloride     PRN Meds: sodium chloride, acetaminophen, ondansetron (ZOFRAN) IV, mouth rinse, sodium chloride, sodium chloride flush   Vital Signs    Vitals:   01/14/23 1200 01/14/23 1300 01/14/23 1600 01/14/23 1702  BP: (!) 117/55 (!) 145/58 108/76 (!) 103/57  Pulse: 62 62 61 63  Resp: 19 14 16 18   Temp:      TempSrc:      SpO2: 98% 100% 96% 100%  Weight:      Height:        Intake/Output Summary (Last 24 hours) at 01/14/2023 1751 Last data filed at 01/14/2023 1600 Gross per 24 hour  Intake 149.76 ml  Output 1000 ml  Net -850.24 ml      01/13/2023    3:00 AM 01/12/2023   11:25 PM 12/28/2022    8:31 AM  Last 3 Weights  Weight (lbs) 159 lb 13.3  oz 158 lb 158 lb 9.6 oz  Weight (kg) 72.5 kg 71.668 kg 71.94 kg      Telemetry    PVCs much less frequent today - Personally Reviewed  ECG    Resolved ST elevations, frequent PVCs - Personally Reviewed  Physical Exam   GEN: No acute distress.  Resting comfortably. Neck: No JVD Cardiac: RRR, normal S1 and S2, possible 1/6 murmur, no LEE Respiratory: Clear to auscultation bilaterally. Breathing comfortably on RA GI: Soft, nontender, non-distended  MS: No edema; No deformity. Neuro:  Nonfocal, alert and oriented  Psych: Normal affect   Labs    High Sensitivity Troponin:   Recent Labs  Lab 01/12/23 2339 01/13/23 0315 01/13/23 0639  TROPONINIHS 7,526* 8,056* 7,941*     Chemistry Recent Labs  Lab 01/12/23 2339 01/13/23 0315 01/14/23 0641  NA 134* 135 136  K 3.9 4.2 4.0  CL 96*  97* 100  CO2 26 25 24   GLUCOSE 441* 279* 163*  BUN 38* 33* 23  CREATININE 1.33* 1.26* 1.20*  CALCIUM 9.2 9.0 8.5*  PROT 7.7 6.8  --   ALBUMIN 4.1 3.5  --   AST 46* 46*  --   ALT 20 19  --   ALKPHOS 110 95  --   BILITOT 0.5 0.4  --   GFRNONAA 41* 43* 46*  ANIONGAP 12 13 12     Lipids  Recent Labs  Lab 01/12/23 2339  CHOL 242*  TRIG 261*  HDL 57  LDLCALC 133*  CHOLHDL 4.2    Hematology Recent Labs  Lab 01/12/23 2339 01/13/23 0315 01/14/23 0641  WBC 9.0 8.7 7.0  RBC 4.26 4.07 3.69*  HGB 12.4 12.1 10.9*  HCT 38.2 35.4* 32.3*  MCV 89.7 87.0 87.5  MCH 29.1 29.7 29.5  MCHC 32.5 34.2 33.7  RDW 13.8 13.6 13.6  PLT 273 253 240   Thyroid No results for input(s): "TSH", "FREET4" in the last 168 hours.  BNPNo results for input(s): "BNP", "PROBNP" in the last 168 hours.  DDimer No results for input(s): "DDIMER" in the last 168 hours.   Radiology  DG Chest Port 1 View  Result Date: 01/12/2023 CLINICAL DATA:  [id number] with chest pain radiating to the upper extremities and back. EXAM: PORTABLE CHEST 1 VIEW COMPARISON:  None Available. FINDINGS: The heart is borderline prominent. There are sternotomy sutures. There is calcification in the aortic arch with unremarkable mediastinum. No vascular congestion is seen. The sulci are sharp. The lungs are clear. Osteopenia and thoracic spondylosis. Asymmetric right shoulder DJD. IMPRESSION: 1. No evidence of acute chest disease. 2. Borderline cardiomegaly.  Prior sternotomy. 3. Aortic atherosclerosis. Electronically Signed   By: Almira Bar M.D.   On: 01/12/2023 23:57    Cardiac Studies   ECHO pending  01/13/23 Cardiac cath: dLM-ost LAD 90%, pLAD-mLAD (after SP1) 100% CTO.   mLCx 100% CTO after OM1    Prox RCA to Dist RCA lesion is 100% stenosed.    LIMA-(D2)-LAD normal / small dLAD.   Seq SVG-OM2-distal\ RCA graft .> 90% focal eccentric heavily calcified (unable to cross with 1.0 mm balloon) followed by 80% stenosis (similar-appearing)    First anastomosis is to the 2nd Mrg lesion-> retrograde flow reveals 65% stenosed proximal to SVG ,   90% focal concentric lesion after OM2 anastomosis diffuse 8085% thrombotic occlusion leading to the insertion at distal RCA.    Moderate reduced EF of 35 to 40% with normal EDP.    Post-Cath Diagnoses: 99% proximal sequential SVG-OM2-d  RCA: The culprit lesion appears to be the downstream segment of the sequential vein graft, however unable to cross the upstream 99% in the first leg even with his 1.0 mm balloon.  Likely no percutaneous options.     Patient Profile     77 y.o. female former smoker with history of CAD (CABG x 3 in 1996), PAD/carotid disease, DM-2, HTN and HLD who is being seen 01/13/2023 for the evaluation of inferior ST elevation myocardial infarction   Assessment & Plan    Principal Problem:   Acute ST elevation myocardial infarction (STEMI) of inferior wall Active Problems:   Coronary artery disease involving native coronary artery of native heart with unstable angina pectoris   Coronary artery disease involving coronary bypass graft of native heart with angina pectoris   Ischemic cardiomyopathy   At risk for sudden cardiac death   Hx of CABG   CHF (congestive heart failure), NYHA class II, acute on chronic, combined   Type II diabetes mellitus with complication   Hyperlipidemia associated with type 2 diabetes mellitus   Essential (primary) hypertension   History of colon polyps   Acute inferior STEMI STEMI CAD s/p prior CABG - > CAD with native vessel and graft disease. She has diffuse severe disease on cath. She has LIMA and prior vein graft. The culprit lesion is distal end of vein graft with proximal stenosis that cannot be crossed. This is not amenable to intervention. She will be treated medically. LIMA predominant vascular supply for her heart. She has been stable since yesterday.   - We are switching her from heparin to lovenox for 2 doses today to complete AC.   - continuing on DAPT with aspirin and plavix.  - Zetia.   40 cc; 1 she has been discussing starting on the librexia trial.  Which would potentially be beneficial help with her only having 1 remaining graft conduit.  HFrEF - ischemic ECHo showed LVEF 30-35% with extensive hypokinesis. Did have quite a few PVCs on monitor yesterday. BB has helped control these some, but she is high risk of sudden cardiac death moving forward. Life vest has been ordered for her to receive prior to discharge with plans for ICD placement eventually In the meantime, we are stopping her amlodipine to allow better GDMT. BP has looked better compared to this morning.  - switch enalapril to losartan to ease transition to entresto - trial of farxiga (did have trouble with empagliflozin in the past) - continue titrating her metop tartrate as tolerated  HLD Ldl 133, very poorly controlled Lpa 201 - statin intolerance, is on PCSK9 inhibitor at baseline  -?  Compliance? - zetia resumed  DM2 - recent 9.4 a1c - ssi and levemir 12 QHS here (return to home dosing on discharge), holding metformin - starting SGLT2 - consider GLP outpatient  HTN BP better this afternoon - stop amlodipne - switch to losartan tomorrow - continue metop  CODE Status - broached topic of DNR/DNI. Patient and family are still wanting to be full code. They do acknowledge her significant heart disease. Will need to have continued discussions in outpatient setting.   For questions or updates, please contact [facility] Please consult [email or web address] for contact info under     Signed, Bryan Lemmaavid Darriona Dehaas, MD (Internal Medicine Resident Physician) 01/14/2023, 5:51 PM     ATTENDING ATTESTATION  I have seen, examined and evaluated the patient this AM on rounds along with Adron BeneGreylon Gawaluck, MD (Internal Medicine Resident Physician).  After reviewing  all the available data and chart, we discussed the patients laboratory, study & physical  findings as well as symptoms in detail.  I agree with his findings, examination as well as impression recommendations as per our discussion.    Attending adjustments noted in italics.   She looks great today.  Less ectopy on exam and on telemetry.  Echo results reviewed showing notably reduced EF which is not unexpected given the size of her infarct.  Thankfully, she is not having any symptoms.  We are planning on trying to gradually switch him to CHF medications by stopping amlodipine and converting ACE inhibitor to ARB with thought to potentially convert to [facility] likely in the outpatient setting if pressures will tolerate.  Continue Toprol.  Will rechallenge with SGLT2 inhibitor. Has prior authorization and placement in setting  With EF of 30 to 35%, large lateral anterolateral infarct on echo significant ectopy on telemetry, concerned that she has a risk for sudden cardiac death.  We are therefore planning for LifeVest on discharge as she will still be within threshold of considering defibrillator if EF not above 35% range on follow-up echo 3 months ago.  Anticipate that she may be ready to go home as early as Sunday if tolerating medications but would definitely want tomorrow to continue to titrate medications based on her enlarged heart.   Marykay Lexavid W. Marinda Tyer, MD, MS Bryan Lemmaavid Adonte Vanriper, M.D., M.S. Interventional Cardiologist  [facility], [facility]  Pager # [phone] Phone # [phone] [address] [place], [place] [address]

## 2023-01-14 NOTE — Progress Notes (Signed)
ANTICOAGULATION CONSULT NOTE - Follow Up Consult  Pharmacy Consult for Heparin > enoxaparin Indication: chest pain/ACS, s/p cath - medical management   Allergies  Allergen Reactions   Propoxyphene Nausea And Vomiting    Other reaction(s): Vomiting   Lidocaine-Epinephrine     Other reaction(s): Other (See Comments) palpitatins and chest pain   Other Other (See Comments)    sutures - in her hand-very irritated   Tape Rash    BANDAIDS. BANDAIDS   Xylocaine  [Lidocaine Hcl]     Other reaction(s): Other (See Comments) palpitatins and chest pain   Statins     Muscle pain and weakness in legs   Jardiance [Empagliflozin] Other (See Comments)    Recurrent vaginal infections    Patient Measurements: Height: 5\' 6"  (167.6 cm) Weight: 72.5 kg (159 lb 13.3 oz) IBW/kg (Calculated) : 59.3  Vital Signs: Temp: 98.6 F (37 C) (04/05 0835) Temp Source: Oral (04/05 0835) BP: 120/66 (04/05 1000) Pulse Rate: 60 (04/05 1000)  Labs: Recent Labs    01/12/23 2339 01/13/23 0315 01/13/23 0639 01/13/23 1810 01/14/23 0641  HGB 12.4 12.1  --   --  10.9*  HCT 38.2 35.4*  --   --  32.3*  PLT 273 253  --   --  240  APTT 25  --   --   --   --   LABPROT 12.6  --   --   --   --   INR 1.0  --   --   --   --   HEPARINUNFRC  --   --   --  0.41 0.44  CREATININE 1.33* 1.26*  --   --  1.20*  TROPONINIHS 7,526* 8,056* 7,941*  --   --      Estimated Creatinine Clearance: 38.8 mL/min (A) (by C-G formula based on SCr of 1.2 mg/dL (H)).   Medical History: Past Medical History:  Diagnosis Date   Anxiety    Cancer    melanoma face   Depression    Diabetes mellitus without complication    Fracture of tibial plateau 12/31/2015   Hyperlipidemia    Hypertension     Assessment: 77 y/o F s/p cath, unable to cross lesion in the cath lab, planning for heparin x 48 hours starting 8 hours after sheath removal, sheath was removed ~0230. Labs above reviewed.   Heparin drip started 850 uts/hr with heparin  level 0.44 at goal and cbc stable, no bleeding  Will change to enoxaparin 1mg /kg q12h  x2 doses for 48hr anticoagulation post MI   Goal of Therapy:  Heparin level 0.3-0.7 units/ml Anti Xa level for LMWH 0.6-1.2 - drawn 4hr after dose Monitor platelets by anticoagulation protocol: Yes   Plan:  Stop heparin drip  Enoxaparin 70mg  q12h x2 doses   Leota Sauers Pharm.D. CPP, BCPS Clinical Pharmacist [phone] 01/14/2023 10:36 AM

## 2023-01-14 NOTE — Progress Notes (Signed)
Heart Failure Nurse Navigator Progress Note  PCP: Gilmore LarocheZarwolo, Gloria, FNP PCP-Cardiologist: Branch Admission Diagnosis: STEMI Admitted from: Home  Presentation:   Angela Rose presented with chest pain that radiates to both arms and back, with mild shortness of breath, paitent does have a history of 3 vessel CABG in 1996. BP 143/73, HR 84, Troponin 7,526, IV heparin started, EKG showed Bundle branch block with inferior ST elevations. Sent to Cath lab found to have diffuse severe CAD. Echo on 4/4/ showed LVEF 30-35% .   Patient and family wee educated on the sign and symptoms of heart failure, daily weights, when to call her doctor or go to the ED. Diet/ fluid restrictions, taking all medications as prescribed and attending all medical appointments. Patient and family verbalized their understanding of education. A HF TOC appointment was scheduled for 02/01/2023 @ 2 pm.   ECHO/ LVEF: 30-35% G1DD  Clinical Course:  Past Medical History:  Diagnosis Date   Anxiety    Cancer    melanoma face   Depression    Diabetes mellitus without complication    Fracture of tibial plateau 12/31/2015   Hyperlipidemia    Hypertension      Social History   Socioeconomic History   Marital status: Significant Other    Spouse name: Not on file   Number of children: 3   Years of education: Not on file   Highest education level: Some college, no degree  Occupational History   Occupation: retired  Tobacco Use   Smoking status: Former    Types: Cigarettes    Quit date: 06/11/1982    Years since quitting: 40.6   Smokeless tobacco: Never   Tobacco comments:    smoking cessation materials not required  Vaping Use   Vaping Use: Never used  Substance and Sexual Activity   Alcohol use: Yes    Alcohol/week: 0.0 standard drinks of alcohol    Comment: rare 1 wine glass of wine   Drug use: No   Sexual activity: Not on file  Other Topics Concern   Not on file  Social History Narrative   Not on file    Social Determinants of Health   Financial Resource Strain: Low Risk  (12/14/2021)   Overall Financial Resource Strain (CARDIA)    Difficulty of Paying Living Expenses: Not hard at all  Food Insecurity: No Food Insecurity (01/13/2023)   Hunger Vital Sign    Worried About Running Out of Food in the Last Year: Never true    Ran Out of Food in the Last Year: Never true  Transportation Needs: No Transportation Needs (01/13/2023)   PRAPARE - Administrator, Civil ServiceTransportation    Lack of Transportation (Medical): No    Lack of Transportation (Non-Medical): No  Physical Activity: Inactive (12/14/2021)   Exercise Vital Sign    Days of Exercise per Week: 0 days    Minutes of Exercise per Session: 0 min  Stress: Stress Concern Present (12/14/2021)   [employer] - Occupational Stress Questionnaire    Feeling of Stress : To some extent  Social Connections: Moderately Isolated (12/14/2021)   Social Connection and Isolation Panel [NHANES]    Frequency of Communication with Friends and Family: More than three times a week    Frequency of Social Gatherings with Friends and Family: Once a week    Attends Religious Services: Never    Database administratorActive Member of Clubs or Organizations: No    Attends BankerClub or Organization Meetings: Never    Marital  Status: Living with partner   Education Assessment and Provision:  Detailed education and instructions provided on heart failure disease management including the following:  Signs and symptoms of Heart Failure When to call the physician Importance of daily weights Low sodium diet Fluid restriction Medication management Anticipated future follow-up appointments  Patient education given on each of the above topics.  Patient acknowledges understanding via teach back method and acceptance of all instructions.  Education Materials:  "Living Better With Heart Failure" Booklet, HF zone tool, & Daily Weight Tracker Tool.  Patient has scale at home: Yes Patient has  pill box at home: NA    High Risk Criteria for Readmission and/or Poor Patient Outcomes: Heart failure hospital admissions (last 6 months): 0  No Show rate: 15 % Difficult social situation: No Demonstrates medication adherence: Yes Primary Language: English Literacy level: Reading, writing, and comprehension.   Barriers of Care:   Diet/ fluid/ daily weights New medication changes  Considerations/Referrals:   Referral made to Heart Failure Pharmacist Stewardship: Yes Referral made to Heart Failure CSW/NCM TOC: No Referral made to Heart & Vascular TOC clinic: Yes,   Items for Follow-up on DC/TOC: Diet/ fluids/ daily weights New medication changes Continued HF education   Rhae Hammock, BSN, RN Heart Failure Print production planner Chat Only

## 2023-01-14 NOTE — Progress Notes (Signed)
   Patient was admitted with an acute inferior STEMI. Cardiac catheterization showed a 99% proximal sequential SVG -OM2-distal RCA lesion (culprit lesion) with severe native disease with near ostial LAD occlusion as well as mid LCX occlusion. LIMA to LAD was patent. PCI of SVG-OM2-distal RCA was attempted but lesion was unable to be cross. Likely no percutaneous option. Echo showed LVEF of 30-35% with hypokinesis of the mid and distal anterior wall, mid and distal lateral wall, inferior, septum, entire apex, entire inferior wall, posterior wall, and mid anterolateral segment. Patient is having frequent PVCs and is felt to be at high risk for unstable arrhythmias. Spoke with Dr. Herbie Baltimore who stated patient is at high risk for sudden cardiac death and recommends a Life Vest. Will place order and notify [employer] Rep.  Corrin Parker, PA-C 01/14/2023 3:23 PM

## 2023-01-14 NOTE — Progress Notes (Addendum)
   Heart Failure Stewardship Pharmacist Progress Note   PCP: Gilmore Laroche, FNP PCP-Cardiologist: Maisie Fus, MD    HPI:  77 yo F with PMH of CAD s/p CABG in 1996, PAD, T2DM, HTN, and HLD.   Presented to the ED on 4/3 with chest pain radiating to arms and back. Initial EKG showed bundle branch block with inferior ST elevations and code STEMI was called. Taken for cath and found to have diffuse severe CAD, plan medical management. ECHO on 4/4 showed LVEF 30-35%, regional wall motion abnormalities, G1DD, and RV normal.  Current HF Medications: Beta Blocker: metoprolol tartrate 25 mg BID ACE/ARB/ARNI: enalapril 5 mg BID  Prior to admission HF Medications: ACE/ARB/ARNI: enalapril 5 mg BID  Pertinent Lab Values: Serum creatinine 1.20, BUN 23, Potassium 4.0, Sodium 136, A1c 9.4   Vital Signs: Weight: 159 lbs (admission weight: 158 lbs) Blood pressure: 90-120/60s  Heart rate: 50-60s   Medication Assistance / Insurance Benefits Check: Does the patient have prescription insurance?  Yes Type of insurance plan: [place]  Outpatient Pharmacy:  Prior to admission outpatient pharmacy: [facility] Is the patient willing to use [facility] TOC pharmacy at discharge? Yes Is the patient willing to transition their outpatient pharmacy to utilize a [facility] outpatient pharmacy?   No    Assessment: 1. Acute systolic CHF (LVEF 30-35%), due to ICM. NYHA class II symptoms. - Not volume overloaded on exam, no indication for diuretics. Strict I/Os and daily weights. Keep K>4 and check Mg. - Continue metoprolol tartrate 25 mg BID, will need to consolidate to succinate prior to discharge - Consider transitioning enalapril to losartan. Eventually may be a candidate for Entresto pending BP trends - Consider starting spironolactone 12.5 mg daily tomorrow, BP improving - Consider SGLT2i today since BP may prevent other GDMT optimizations - Consider stopping amlodipine to allow for other GDMT  optimizations   Plan: 1) Medication changes recommended at this time: - Stop amlodipine 2.5 mg daily - Stop enalapril 5 mg BID - Start losartan 25 mg daily - Add Farxiga 10 mg daily - Transition to metoprolol succinate for discharge  2) Patient assistance: - Entresto copay $0 - Farxiga/Jardiance copay $0  3)  Education  - Patient has been educated on current HF medications and potential additions to HF medication regimen - Patient verbalizes understanding that over the next few months, these medication doses may change and more medications may be added to optimize HF regimen - Patient has been educated on basic disease state pathophysiology and goals of therapy   Sharen Hones, PharmD, BCPS Heart Failure Stewardship Pharmacist Phone [phone]

## 2023-01-14 NOTE — TOC Initial Note (Signed)
Transition of Care [facility]) - Initial/Assessment Note    Patient Details  Name: Angela Rose MRN: [id number] Date of Birth: 1946-08-18  Transition of Care [facility]) CM/SW Contact:    Elliot Cousin, RN Phone Number: [phone] 01/14/2023, 2:28 PM  Clinical Narrative:                 CM spoke to pt and states she lives in home with SO. States she has RW with seat in the home. Waiting PT/OT recommendations for home. Pt may need HH PT. Will continue to follow for dc needs.   Expected Discharge Plan: Home/Self Care Barriers to Discharge: Continued Medical Work up   Patient Goals and CMS Choice Patient states their goals for this hospitalization and ongoing recovery are:: wants to be able to care for herself at home          Expected Discharge Plan and Services   Discharge Planning Services: CM Consult   Living arrangements for the past 2 months: Single Family Home                                      Prior Living Arrangements/Services Living arrangements for the past 2 months: Single Family Home Lives with:: Significant Other Patient language and need for interpreter reviewed:: Yes        Need for Family Participation in Patient Care: No (Comment) Care giver support system in place?: Yes (comment) Current home services: DME ([employer] with seat) Criminal Activity/Legal Involvement Pertinent to Current Situation/Hospitalization: No - Comment as needed  Activities of Daily Living Home Assistive Devices/Equipment: Cane (specify quad or straight), Eyeglasses, CBG Meter ADL Screening (condition at time of admission) Patient's cognitive ability adequate to safely complete daily activities?: Yes Is the patient deaf or have difficulty hearing?: No Does the patient have difficulty seeing, even when wearing glasses/contacts?: No Does the patient have difficulty concentrating, remembering, or making decisions?: No Patient able to express need for assistance  with ADLs?: Yes Does the patient have difficulty dressing or bathing?: No Independently performs ADLs?: Yes (appropriate for developmental age) Does the patient have difficulty walking or climbing stairs?: No Weakness of Legs: None Weakness of Arms/Hands: None  Permission Sought/Granted Permission sought to share information with : Case Manager, Family Supports Permission granted to share information with : Yes, Verbal Permission Granted  Share Information with NAME: Waylan Rocher     Permission granted to share info w Relationship: SO  Permission granted to share info w Contact Information: [phone]  Emotional Assessment Appearance:: Appears stated age Attitude/Demeanor/Rapport: Engaged Affect (typically observed): Accepting Orientation: : Oriented to Self, Oriented to Place, Oriented to  Time, Oriented to Situation   Psych Involvement: No (comment)  Admission diagnosis:  ST elevation myocardial infarction (STEMI), unspecified artery [I21.3] Acute ST elevation myocardial infarction (STEMI) of inferior wall [I21.19] Patient Active Problem List   Diagnosis Date Noted   Acute ST elevation myocardial infarction (STEMI) of inferior wall 01/13/2023   Hx of CABG 01/13/2023   Coronary artery disease involving native coronary artery of native heart with unstable angina pectoris 01/13/2023   Coronary artery disease involving coronary bypass graft of native heart with angina pectoris 01/13/2023   Need for immunization against influenza 09/07/2022   Chronic sinusitis 07/02/2022   Headache 07/02/2022   AKI (acute kidney injury) 05/06/2022   Fatigue 02/10/2022   Allergies 11/09/2021   Constipation 11/09/2021  Cataract, nuclear sclerotic, both eyes 11/08/2018   OAB (overactive bladder) 08/11/2018   Tinnitus aurium, bilateral 08/11/2018   Renal cyst, right 05/01/2018   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   Other tear of medial meniscus, current injury, left knee, initial encounter  12/31/2015   Type II diabetes mellitus with complication 08/14/2015   Hyperlipidemia associated with type 2 diabetes mellitus 08/14/2015   Coronary artery disease involving native coronary artery of native heart without angina pectoris 08/14/2015   Basal cell carcinoma 08/11/2015   History of melanoma 08/11/2015   Major depression in partial remission 08/08/2015   Non-proliferative diabetic retinopathy, both eyes 08/08/2015   History of colon polyps 08/08/2015   Hypothyroidism, postablative 08/08/2015   Carotid artery plaque 09/03/2014   Essential (primary) hypertension 09/03/2014   APC (atrial premature contractions) 09/03/2014   PCP:  Gilmore LarocheZarwolo, Gloria, FNP Pharmacy:   [facility] [phone] - [place] - [address] AT [facility] [id number] [place] [address] Phone: [phone] Fax: [phone]  [facility] [address] [place] [address] Phone: [phone] Fax: [phone]     Social Determinants of Health (SDOH) Social History: SDOH Screenings   Food Insecurity: No Food Insecurity (01/13/2023)  Housing: Low Risk  (01/13/2023)  Transportation Needs: No Transportation Needs (01/13/2023)  Utilities: Not At Risk (01/13/2023)  Alcohol Screen: Low Risk  (01/14/2023)  Depression (PHQ2-9): Low Risk  (01/05/2023)  Financial Resource Strain: Low Risk  (01/14/2023)  Physical Activity: Inactive (12/14/2021)  Social Connections: Moderately Isolated (12/14/2021)  Stress: Stress Concern Present (12/14/2021)  Tobacco Use: Medium Risk (01/13/2023)   SDOH Interventions: Food Insecurity Interventions: Intervention Not Indicated Housing Interventions: Intervention Not Indicated Transportation Interventions: Intervention Not Indicated Utilities Interventions: Intervention Not Indicated Alcohol Usage Interventions: Intervention Not Indicated (Score <7) Financial Strain Interventions: Intervention Not  Indicated   Readmission Risk Interventions     No data to display

## 2023-01-15 DIAGNOSIS — I2119 ST elevation (STEMI) myocardial infarction involving other coronary artery of inferior wall: Secondary | ICD-10-CM | POA: Diagnosis not present

## 2023-01-15 LAB — BASIC METABOLIC PANEL
Anion gap: 11 (ref 5–15)
BUN: 24 mg/dL — ABNORMAL HIGH (ref 8–23)
CO2: 24 mmol/L (ref 22–32)
Calcium: 8.4 mg/dL — ABNORMAL LOW (ref 8.9–10.3)
Chloride: 101 mmol/L (ref 98–111)
Creatinine, Ser: 1.28 mg/dL — ABNORMAL HIGH (ref 0.44–1.00)
GFR, Estimated: 43 mL/min — ABNORMAL LOW (ref >60)
Glucose, Bld: 135 mg/dL — ABNORMAL HIGH (ref 70–99)
Potassium: 3.7 mmol/L (ref 3.5–5.1)
Sodium: 136 mmol/L (ref 135–145)

## 2023-01-15 LAB — CBC
HCT: 33 % — ABNORMAL LOW (ref 36.0–46.0)
Hemoglobin: 10.8 g/dL — ABNORMAL LOW (ref 12.0–15.0)
MCH: 29 pg (ref 26.0–34.0)
MCHC: 32.7 g/dL (ref 30.0–36.0)
MCV: 88.7 fL (ref 80.0–100.0)
Platelets: 242 10*3/uL (ref 150–400)
RBC: 3.72 MIL/uL — ABNORMAL LOW (ref 3.87–5.11)
RDW: 13.6 % (ref 11.5–15.5)
WBC: 7.9 10*3/uL (ref 4.0–10.5)
nRBC: 0 % (ref 0.0–0.2)

## 2023-01-15 LAB — GLUCOSE, CAPILLARY
Glucose-Capillary: 292 mg/dL — ABNORMAL HIGH (ref 70–99)
Glucose-Capillary: 157 mg/dL — ABNORMAL HIGH (ref 70–99)
Glucose-Capillary: 123 mg/dL — ABNORMAL HIGH (ref 70–99)
Glucose-Capillary: 255 mg/dL — ABNORMAL HIGH (ref 70–99)
Glucose-Capillary: 205 mg/dL — ABNORMAL HIGH (ref 70–99)

## 2023-01-15 LAB — MAGNESIUM: Magnesium: 2.2 mg/dL (ref 1.7–2.4)

## 2023-01-15 NOTE — Progress Notes (Signed)
Rounding Note    Patient Name: Trynitee Fore Budzinski Date of Encounter: 01/15/2023  [facility] Cardiologist: Maisie Fus, MD   Subjective   Patient is interested in librexa trial. She is on other trial at [facility]. Discussed that we did not place stent, but she only has one remaining artery. She lost pump function on ECHO, discussed dropping amlodipine off. A BB we started yesterday. Could switch from enalapril in the future. She says she tolerated ambulation and not having any CP.    Inpatient Medications    Scheduled Meds:  aspirin EC  81 mg Oral QPM   azelastine  1 spray Each Nare BID   Chlorhexidine Gluconate Cloth  6 each Topical Daily   clopidogrel  75 mg Oral Daily   dapagliflozin propanediol  10 mg Oral Daily   ezetimibe  10 mg Oral Daily   FLUoxetine  40 mg Oral Daily   insulin aspart  0-15 Units Subcutaneous TID WC   insulin detemir  12 Units Subcutaneous QHS   levothyroxine  50 mcg Oral QAC breakfast   loratadine  10 mg Oral Daily   losartan  25 mg Oral Daily   metoprolol tartrate  25 mg Oral BID   sodium chloride flush  3 mL Intravenous Q12H   Continuous Infusions:  sodium chloride     PRN Meds: sodium chloride, acetaminophen, ondansetron (ZOFRAN) IV, mouth rinse, sodium chloride, sodium chloride flush   Vital Signs    Vitals:   01/15/23 0400 01/15/23 0500 01/15/23 0600 01/15/23 0803  BP: 130/64 107/60 (!) 109/56 (!) 115/59  Pulse: 63 (!) 59 (!) 56 70  Resp: 18 17 13 19   Temp:   98.4 F (36.9 C)   TempSrc:      SpO2: 97% 95% 95% 96%  Weight:      Height:        Intake/Output Summary (Last 24 hours) at 01/15/2023 0920 Last data filed at 01/15/2023 0800 Gross per 24 hour  Intake 214.86 ml  Output 1650 ml  Net -1435.14 ml      01/13/2023    3:00 AM 01/12/2023   11:25 PM 12/28/2022    8:31 AM  Last 3 Weights  Weight (lbs) 159 lb 13.3 oz 158 lb 158 lb 9.6 oz  Weight (kg) 72.5 kg 71.668 kg 71.94 kg      Telemetry    PVCs much less  frequent today - Personally Reviewed  ECG    Resolved ST elevations, frequent PVCs - Personally Reviewed  Physical Exam   GEN: No acute distress.  Resting comfortably. Neck: No JVD Cardiac: RRR, normal S1 and S2, possible 1/6 murmur, no LEE Respiratory: Clear to auscultation bilaterally. Breathing comfortably on RA GI: Soft, nontender, non-distended  MS: No edema; No deformity. Neuro:  Nonfocal, alert and oriented  Psych: Normal affect   Labs    High Sensitivity Troponin:   Recent Labs  Lab 01/12/23 2339 01/13/23 0315 01/13/23 0639  TROPONINIHS 7,526* 8,056* 7,941*     Chemistry Recent Labs  Lab 01/12/23 2339 01/13/23 0315 01/14/23 0641 01/15/23 0156  NA 134* 135 136 136  K 3.9 4.2 4.0 3.7  CL 96* 97* 100 101  CO2 26 25 24 24   GLUCOSE 441* 279* 163* 135*  BUN 38* 33* 23 24*  CREATININE 1.33* 1.26* 1.20* 1.28*  CALCIUM 9.2 9.0 8.5* 8.4*  MG  --   --   --  2.2  PROT 7.7 6.8  --   --  ALBUMIN 4.1 3.5  --   --   AST 46* 46*  --   --   ALT 20 19  --   --   ALKPHOS 110 95  --   --   BILITOT 0.5 0.4  --   --   GFRNONAA 41* 43* 46* 43*  ANIONGAP 12 13 12 11     Lipids  Recent Labs  Lab 01/12/23 2339  CHOL 242*  TRIG 261*  HDL 57  LDLCALC 133*  CHOLHDL 4.2    Hematology Recent Labs  Lab 01/13/23 0315 01/14/23 0641 01/15/23 0156  WBC 8.7 7.0 7.9  RBC 4.07 3.69* 3.72*  HGB 12.1 10.9* 10.8*  HCT 35.4* 32.3* 33.0*  MCV 87.0 87.5 88.7  MCH 29.7 29.5 29.0  MCHC 34.2 33.7 32.7  RDW 13.6 13.6 13.6  PLT 253 240 242   Thyroid No results for input(s): "TSH", "FREET4" in the last 168 hours.  BNPNo results for input(s): "BNP", "PROBNP" in the last 168 hours.  DDimer No results for input(s): "DDIMER" in the last 168 hours.   Radiology  DG Chest Port 1 View  Result Date: 01/12/2023 CLINICAL DATA:  [id number] with chest pain radiating to the upper extremities and back. EXAM: PORTABLE CHEST 1 VIEW COMPARISON:  None Available. FINDINGS: The heart is borderline  prominent. There are sternotomy sutures. There is calcification in the aortic arch with unremarkable mediastinum. No vascular congestion is seen. The sulci are sharp. The lungs are clear. Osteopenia and thoracic spondylosis. Asymmetric right shoulder DJD. IMPRESSION: 1. No evidence of acute chest disease. 2. Borderline cardiomegaly.  Prior sternotomy. 3. Aortic atherosclerosis. Electronically Signed   By: Almira BarKeith  Chesser M.D.   On: 01/12/2023 23:57    Cardiac Studies   ECHO pending  01/13/23 Cardiac cath: dLM-ost LAD 90%, pLAD-mLAD (after SP1) 100% CTO.   mLCx 100% CTO after OM1    Prox RCA to Dist RCA lesion is 100% stenosed.    LIMA-(D2)-LAD normal / small dLAD.   Seq SVG-OM2-distal\ RCA graft .> 90% focal eccentric heavily calcified (unable to cross with 1.0 mm balloon) followed by 80% stenosis (similar-appearing)   First anastomosis is to the 2nd Mrg lesion-> retrograde flow reveals 65% stenosed proximal to SVG ,   90% focal concentric lesion after OM2 anastomosis diffuse 8085% thrombotic occlusion leading to the insertion at distal RCA.    Moderate reduced EF of 35 to 40% with normal EDP.    Post-Cath Diagnoses: 99% proximal sequential SVG-OM2-d RCA: The culprit lesion appears to be the downstream segment of the sequential vein graft, however unable to cross the upstream 99% in the first leg even with his 1.0 mm balloon.  Likely no percutaneous options.     Patient Profile     77 y.o. female former smoker with history of CAD (CABG x 3 in 1996), PAD/carotid disease, DM-2, HTN and HLD who is being seen 01/13/2023 for the evaluation of inferior ST elevation myocardial infarction   Assessment & Plan    Principal Problem:   Acute ST elevation myocardial infarction (STEMI) of inferior wall Active Problems:   History of colon polyps   Essential (primary) hypertension   Type II diabetes mellitus with complication   Hyperlipidemia associated with type 2 diabetes mellitus   Hx of CABG   Coronary  artery disease involving native coronary artery of native heart with unstable angina pectoris   Coronary artery disease involving coronary bypass graft of native heart with angina pectoris   CHF (congestive heart  failure), NYHA class II, acute on chronic, combined   Ischemic cardiomyopathy   At risk for sudden cardiac death   Acute inferior STEMI STEMI CAD s/p prior CABG - > CAD with native vessel and graft disease. She has diffuse severe disease on cath. She has LIMA and prior vein graft. The culprit lesion is distal end of vein graft with proximal stenosis that cannot be crossed. This is not amenable to intervention. She will be treated medically. LIMA predominant vascular supply for her heart. She has been stable since yesterday.   - 48 hours of enoxaparin -- to continue through Sunday  - continuing on DAPT with aspirin and plavix.  - Zetia.   40 cc; 1 she has been discussing starting on the librexia trial.  Which would potentially be beneficial help with her only having 1 remaining graft conduit.  HFrEF - ischemic ECHo showed LVEF 30-35% with extensive hypokinesis.   - will need entresto after ACE-I washout. Last dose of enalapril was 4/5 - trial of farxiga (did have trouble with empagliflozin in the past) - continue titrating her metop tartrate as tolerated  HLD Ldl 133, very poorly controlled Lpa 201 - statin intolerance, is on PCSK9 inhibitor at baseline  -?  Compliance? - zetia resumed  DM2 - recent 9.4 a1c - ssi and levemir 12 QHS here (return to home dosing on discharge), holding metformin - starting SGLT2 - consider GLP outpatient  HTN BP better this afternoon - stop amlodipine - continue losartan 25 - continue metop 25  CODE Status - broached topic of DNR/DNI. Patient and family are still wanting to be full code. They do acknowledge her significant heart disease. Will need to have continued discussions in outpatient setting.   For questions or updates, please  contact [facility] Please consult [email or web address] for contact info under     Signed, Maurice SmallAugustus E Gema Ringold, MD  01/15/2023, 9:20 AM         She looks great today.  Less ectopy on exam and on telemetry.  Echo results reviewed showing notably reduced EF which is not unexpected given the size of her infarct.  Thankfully, she is not having any symptoms.  We are planning on trying to gradually switch him to CHF medications by stopping amlodipine and converting ACE inhibitor to ARB with thought to potentially [facility] likely in the outpatient setting if pressures will tolerate.  Continue Toprol.  Will rechallenge with SGLT2 inhibitor. Has prior authorization and placement in setting  With EF of 30 to 35%, large lateral anterolateral infarct on echo significant ectopy on telemetry, concerned that she has a risk for sudden cardiac death.  We are therefore planning for LifeVest on discharge as she will still be within threshold of considering defibrillator if EF not above 35% range on follow-up echo 3 months ago.  Anticipate that she may be ready to go home as early as Sunday if tolerating medications but would definitely want tomorrow to continue to titrate medications based on her enlarged heart.   Maurice SmallAugustus E Torrin Crihfield, M.D., M.S. Interventional Cardiologist  [facility]  Pager # [phone] Phone # [phone] [address] [place], [place] [address]

## 2023-01-15 NOTE — Progress Notes (Signed)
CARDIAC REHAB PHASE I   PRE:  Rate/Rhythm: 63 SR  BP:  Sitting: 104/44      SaO2: 95 RA  MODE:  Ambulation: 400 ft   POST:  Rate/Rhythm: 85 SR  BP:  Sitting: 131/53      SaO2: 97 RA  Pt tolerated exercise well and amb 400 ft with eva, and minimal . Pt denies CP, SOB, or dizziness throughout walk. Pt to recliner after walk. Pt was sad when I first came in the room due to her health. Pt felt better has she conversed with me. Will continue to follow.  [phone] Joya San, MS, ACSM-CEP 01/15/2023 10:56 AM

## 2023-01-16 DIAGNOSIS — I2119 ST elevation (STEMI) myocardial infarction involving other coronary artery of inferior wall: Secondary | ICD-10-CM | POA: Diagnosis not present

## 2023-01-16 LAB — BASIC METABOLIC PANEL
Anion gap: 6 (ref 5–15)
BUN: 32 mg/dL — ABNORMAL HIGH (ref 8–23)
CO2: 24 mmol/L (ref 22–32)
Calcium: 8 mg/dL — ABNORMAL LOW (ref 8.9–10.3)
Chloride: 103 mmol/L (ref 98–111)
Creatinine, Ser: 1.36 mg/dL — ABNORMAL HIGH (ref 0.44–1.00)
GFR, Estimated: 40 mL/min — ABNORMAL LOW (ref >60)
Glucose, Bld: 166 mg/dL — ABNORMAL HIGH (ref 70–99)
Potassium: 4.1 mmol/L (ref 3.5–5.1)
Sodium: 133 mmol/L — ABNORMAL LOW (ref 135–145)

## 2023-01-16 LAB — GLUCOSE, CAPILLARY: Glucose-Capillary: 152 mg/dL — ABNORMAL HIGH (ref 70–99)

## 2023-01-16 MED ORDER — METOPROLOL SUCCINATE ER 25 MG PO TB24: 25.0000 mg | ORAL_TABLET | Freq: Every day | ORAL | 3 refills | Status: DC

## 2023-01-16 MED ORDER — NITROGLYCERIN 0.4 MG SL SUBL: 0.4000 mg | SUBLINGUAL_TABLET | SUBLINGUAL | 3 refills | Status: DC | PRN

## 2023-01-16 MED ORDER — FUROSEMIDE 40 MG PO TABS: ORAL_TABLET | ORAL | 11 refills | Status: DC

## 2023-01-16 MED ORDER — LOSARTAN POTASSIUM 25 MG PO TABS: 25.0000 mg | ORAL_TABLET | Freq: Every day | ORAL | 11 refills | Status: DC

## 2023-01-16 MED ORDER — METOPROLOL SUCCINATE ER 25 MG PO TB24
25.0000 mg | ORAL_TABLET | Freq: Every day | ORAL | Status: DC
  Administered 2023-01-16: 25 mg via ORAL
  Filled 2023-01-16: qty 1

## 2023-01-16 MED ORDER — METFORMIN HCL ER 500 MG PO TB24: 500.0000 mg | ORAL_TABLET | Freq: Every day | ORAL | 3 refills | Status: DC

## 2023-01-16 MED ORDER — CLOPIDOGREL BISULFATE 75 MG PO TABS: 75.0000 mg | ORAL_TABLET | Freq: Every day | ORAL | 3 refills | Status: DC

## 2023-01-16 MED ORDER — DAPAGLIFLOZIN PROPANEDIOL 10 MG PO TABS: 10.0000 mg | ORAL_TABLET | Freq: Every day | ORAL | 11 refills | Status: DC

## 2023-01-16 NOTE — Discharge Summary (Signed)
Discharge Summary    Patient ID: Angela Rose MRN: [id number]; DOB: 11-29-1945  Admit date: 01/12/2023 Discharge date: 01/16/2023  PCP:  Gilmore LarocheZarwolo, Gloria, FNP   [facility] Providers Cardiologist:  Maisie FusBranch, Mary E, MD   Discharge Diagnoses    Principal Problem:   Acute ST elevation myocardial infarction (STEMI) of inferior wall Active Problems:   History of colon polyps   Essential (primary) hypertension   Type II diabetes mellitus with complication   Hyperlipidemia associated with type 2 diabetes mellitus   Hx of CABG   Coronary artery disease involving native coronary artery of native heart with unstable angina pectoris   Coronary artery disease involving coronary bypass graft of native heart with angina pectoris   CHF (congestive heart failure), NYHA class II, acute on chronic, combined   Ischemic cardiomyopathy   At risk for sudden cardiac death    Diagnostic Studies/Procedures    Left heart cath 01/13/23:   Dist LM to Ost LAD lesion is 90% stenosed.  Prox LAD to Mid LAD lesion is 100% stenosed (after SP1).   LIMA-(D2)-LAD graft was visualized by angiography and is moderate in size.  The graft exhibits no disease.   Mid Cx lesion is 100% stenosed.   Prox RCA to Dist RCA lesion is 100% stenosed.   Seq SVG- Sequential SVG-OM2-distal\ RCA graft was visualized by angiography and is large.   Mid Graft lesion before 2nd Mrg  is 99% stenosed. -> Balloon angioplasty was performed using a BALLN SAPPHIRE 1.5X20. & Balloon angioplasty was performed using a BALLN SAPPHIRE 1.0X10. = Neither balloon was able to cross the lesion.  Post intervention, there is a 95% residual stenosis.   Dist Graft lesion before 2nd Mrg  is 80% stenosed.  Remainder of the proximal limb is free of disease until the first anastomosis   First anastomosis is to the 2nd Mrg lesion-> retrograde flow reveals 65% stenosed proximal to SVG insertion site.   Prox Graft lesion between 2nd Mrg and Dist RCA   is 90% stenosed.  Prox Graft to Insertion lesion between 2nd Mrg and Dist RCA is 80% stenosed-diffuse thrombus beyond the 90% stenosis.   There is moderate left ventricular systolic dysfunction.   LV end diastolic pressure is normal.   The left ventricular ejection fraction is 35-45% by visual estimate.     Post-Cath Diagnoses: 99% proximal sequential SVG-OM2-d RCA: The culprit lesion appears to be the downstream segment of the sequential vein graft, however unable to cross the upstream 99% in the first leg even with his 1.0 mm balloon.  Likely no percutaneous options. Severe native disease with near ostial LAD occlusion after SP1, as well as proximal RCA, mid LCx occlusion Moderate to mildly reduced EF with regional wall motion abnormalities suggesting basal to mid inferior hypo to akinesis and then inferoapical hypokinesis. Normal LVEDP     PLAN OF CARE: Admit to inpatient  -> will start heparin to run 48 hours beginning 8 hours after sheath removal.  Will need to plan medical management of basically occluded distal leg of the vein graft and unable to cross the more proximal limb. Will treat with Brilinta today and then convert on Friday, 01/14/2023 to Plavix treating with 300 mg x 1 and then on 01/15/2023 75 mg daily. Will check lipid panel, A1c and LP(a)-has statin intolerance, is on PCSK9 inhibitor at baseline, this will be continued at discharge. Will hold ACE inhibitor today and restart on 01/14/2023.  Hold HCTZ during hospitalization and  continue amlodipine. Monitor heart rate to see if she can tolerate beta-blocker. _____________   Echo 01/13/23: 1. Left ventricular ejection fraction, by estimation, is 30 to 35%. Left  ventricular ejection fraction by 2D MOD biplane is 33.1 %. The left  ventricle has moderately decreased function. The left ventricle  demonstrates regional wall motion abnormalities  (see scoring diagram/findings for description). There is mild concentric  left ventricular  hypertrophy. Left ventricular diastolic parameters are  consistent with Grade I diastolic dysfunction (impaired relaxation).   2. Right ventricular systolic function is normal. The right ventricular  size is normal.   3. Left atrial size was mildly dilated.   4. The mitral valve is normal in structure. Trivial mitral valve  regurgitation. No evidence of mitral stenosis.   5. Calcification mostly affects the non-coronary cusp. The aortic valve  is normal in structure. There is moderate calcification of the aortic  valve. There is moderate thickening of the aortic valve. Aortic valve  regurgitation is not visualized. Aortic  valve sclerosis/calcification is present, without any evidence of aortic  stenosis.   6. The inferior vena cava is normal in size with greater than 50%  respiratory variability, suggesting right atrial pressure of 3 mmHg.    History of Present Illness     Angela Rose is a 77 y.o. female with a history of CAD (CABG x 3 in 1996), PAD/carotid disease, DM-2, HTN, HLD with statin intolerance, and prior smoking history who is being seen 01/13/2023 for the evaluation of inferior ST elevation myocardial infarction.   Ms. Boline was most recently seen by Dr. Carolan Clines on July 06, 2022 mostly noting some allergy symptoms and may be some fatigue thought to be related to thyroid disease.  Lipids were not at goal as her Crestor was increased that she subsequently stopped Crestor and was on Zetia.  She had eventually ended up on Repatha.   She had been doing well, in her usual state of health relatively active until a few days prior to admission.  She has been having intermittent episodes of substernal chest discomfort over the last couple days but on the evening of Wednesday, 01/12/2023, she had gotten up to get ready for bed after watching her routine shows with her husband, but sat back down again due to severe substernal chest discomfort radiating across the chest to  both arms and back.  She notes mild dyspnea but no nausea or vomiting.  Had never really had a history of MI before but she knew that something was wrong.  She went to [facility] emergency room via [facility] where she was seen by Dr. Blinda Leatherwood. Initial EKG showed bundle branch block with inferior ST elevations and code STEMI was called.   She was given 4000 units of IV heparin, 325 mg aspirin, 4 of Zofran and 4 morphine from the ER to arrival here.   She arrived to [facility] chest pain-free, and on her monitor her ST segment elevations were essentially resolved.  However based on presentation I chose to proceed with cardiac catheterization.    Hospital Course     Consultants: none  STEMI CAD s/p CABG Severe native vessel and graft disease Emergent LHC showed severe native vessel disease. SVG-OM2-distalRCA felt to be the culprit lesion with 99% proximal stenosis. PCI of the SVG-OM2-dRCA was attempted but ultimately unable to cross the lesion. Dr. Herbie Baltimore felt likely no PCI options. Echo showed LVEF 30-35% with hypokinesis of the mid and distal anterior wall, mid  and distal lateral wall, inferior, septum, entire apex, and anterior inferior wall, posterior wall, and mid anterolateral segment. She was felt high risk for lethal arrhythmias given frequent PVCs on monitor and was recommended for lifevest. She was fitted and discharged with lifevest in place.  - DAPT with ASA and plavix   Chronic systolic heart failure Ischemic cardiomyopathy Echo this admission with LVEF 30-35% with extensive wall motion abnormality. Last dose of enalapril was 01/14/23.  GDMT: toprol 25 mg daily, losartan 25 mg daily, 10 mg farxiga Has had yeast infections with Jardiance. Per Dr. Nelly Laurence, will challenge with farxiga, but may not tolerate.  - SBP 110s - plan to attempt low does entresto and spironolactone at OP follow up - will discharge on PRN lasix - check an echo 3 months after max therapy   Hyperlipidemia 01/12/2023:  Cholesterol 242; HDL 57; LDL Cholesterol 133; Triglycerides 261; VLDL 52 LP (a) 201 Statin intolerant On repatha and zetia - will need to confirm compliance at OP visit AST 46    DM2 A1c 9.4% Consider GLT1 OP Trial farxiga, though had yeast infections on jardiance Resume home insulin regimen Hold metformin    Hypertension - we have discontinued home amlodipine for GDMT for CHF - SBP 110s today   ?CKD sCr on arrival was 1.33, prior 1.19 Question component of CKD - discharge sCr 1.36, K 4.1 Will need updated BMP at follow up on [facility]B   CODE status DNR/DNI has been mentioned. Family and patient continue to want full code. Continue discussion as an outpatient.   Pt seen and examined by Dr. Nelly Laurence and felt stable for discharge.  Follow up has been arranged with both general cardiology and OP AHF clinic.      Did the patient have an acute coronary syndrome (MI, NSTEMI, STEMI, etc) this admission?:  Yes                               AHA/ACC Clinical Performance & Quality Measures: Aspirin prescribed? - Yes ADP Receptor Inhibitor (Plavix/Clopidogrel, Brilinta/Ticagrelor or Effient/Prasugrel) prescribed (includes medically managed patients)? - Yes Beta Blocker prescribed? - Yes High Intensity Statin (Lipitor 40-80mg  or Crestor 20-40mg ) prescribed? - No - statin intolerant EF assessed during THIS hospitalization? - Yes For EF <40%, was ACEI/ARB prescribed? - Yes For EF <40%, Aldosterone Antagonist (Spironolactone or Eplerenone) prescribed? - No - Reason:  BP Cardiac Rehab Phase II ordered (including medically managed patients)? - Yes       The patient will be scheduled for a TOC follow up appointment in 7-14 days.  A message has been sent to the [facility] and Scheduling Pool at the office where the patient should be seen for follow up.  _____________  Discharge Vitals Blood pressure (!) 116/48, pulse 72, temperature 97.7 F (36.5 C), temperature source Oral, resp. rate 13,  height 5\' 6"  (1.676 m), weight 71.2 kg, SpO2 100 %.  Filed Weights   01/12/23 2325 01/13/23 0300 01/16/23 0641  Weight: 71.7 kg 72.5 kg 71.2 kg    Labs & Radiologic Studies    CBC Recent Labs    01/14/23 0641 01/15/23 0156  WBC 7.0 7.9  HGB 10.9* 10.8*  HCT 32.3* 33.0*  MCV 87.5 88.7  PLT 240 242   Basic Metabolic Panel Recent Labs    40/98/11 0156 01/16/23 0136  NA 136 133*  K 3.7 4.1  CL 101 103  CO2 24 24  GLUCOSE 135*  166*  BUN 24* 32*  CREATININE 1.28* 1.36*  CALCIUM 8.4* 8.0*  MG 2.2  --    Liver Function Tests No results for input(s): "AST", "ALT", "ALKPHOS", "BILITOT", "PROT", "ALBUMIN" in the last 72 hours. No results for input(s): "LIPASE", "AMYLASE" in the last 72 hours. High Sensitivity Troponin:   Recent Labs  Lab 01/12/23 2339 01/13/23 0315 01/13/23 0639  TROPONINIHS 7,526* 8,056* 7,941*    BNP Invalid input(s): "POCBNP" D-Dimer No results for input(s): "DDIMER" in the last 72 hours. Hemoglobin A1C No results for input(s): "HGBA1C" in the last 72 hours. Fasting Lipid Panel No results for input(s): "CHOL", "HDL", "LDLCALC", "TRIG", "CHOLHDL", "LDLDIRECT" in the last 72 hours. Thyroid Function Tests No results for input(s): "TSH", "T4TOTAL", "T3FREE", "THYROIDAB" in the last 72 hours.  Invalid input(s): "FREET3" _____________  ECHOCARDIOGRAM COMPLETE  Result Date: 01/14/2023    ECHOCARDIOGRAM REPORT   Patient Name:   Gianelle Mccaul Mehlhoff Date of Exam: 01/13/2023 Medical Rec #:  [id number]            Height:       66.0 in Accession #:    [id number]           Weight:       159.8 lb Date of Birth:  [birth year]            BSA:          1.818 m Patient Age:    77 years             BP:           115/77 mmHg Patient Gender: F                    HR:           63 bpm. Exam Location:  Inpatient Procedure: 2D Echo, Cardiac Doppler, Color Doppler and Intracardiac            Opacification Agent Indications:    122-I22.9 Subsequent ST elevation (STEM) and non-ST  elevation                 (NSTEMI) myocardial infarction; I25.110 Atherosclerotic heart                 disease of native coronary artery with unstable angina pectoris  History:        Patient has no prior history of Echocardiogram examinations. CAD                 and Acute MI, Prior CABG, Signs/Symptoms:Chest Pain; Risk                 Factors:Diabetes, Current Smoker, Hypertension and Dyslipidemia.  Sonographer:    Sheralyn Boatman RDCS Referring Phys: Piedad Climes [facility] IMPRESSIONS  1. Left ventricular ejection fraction, by estimation, is 30 to 35%. Left ventricular ejection fraction by 2D MOD biplane is 33.1 %. The left ventricle has moderately decreased function. The left ventricle demonstrates regional wall motion abnormalities (see scoring diagram/findings for description). There is mild concentric left ventricular hypertrophy. Left ventricular diastolic parameters are consistent with Grade I diastolic dysfunction (impaired relaxation).  2. Right ventricular systolic function is normal. The right ventricular size is normal.  3. Left atrial size was mildly dilated.  4. The mitral valve is normal in structure. Trivial mitral valve regurgitation. No evidence of mitral stenosis.  5. Calcification mostly affects the non-coronary cusp. The aortic valve is normal in structure. There is moderate calcification of the aortic valve. There  is moderate thickening of the aortic valve. Aortic valve regurgitation is not visualized. Aortic valve sclerosis/calcification is present, without any evidence of aortic stenosis.  6. The inferior vena cava is normal in size with greater than 50% respiratory variability, suggesting right atrial pressure of 3 mmHg. FINDINGS  Left Ventricle: Left ventricular ejection fraction, by estimation, is 30 to 35%. Left ventricular ejection fraction by 2D MOD biplane is 33.1 %. The left ventricle has moderately decreased function. The left ventricle demonstrates regional wall motion abnormalities. The left  ventricular internal cavity size was normal in size. There is mild concentric left ventricular hypertrophy. Left ventricular diastolic parameters are consistent with Grade I diastolic dysfunction (impaired relaxation). Indeterminate filling pressures.  LV Wall Scoring: The mid and distal anterior wall, mid and distal lateral wall, inferior septum, entire apex, entire inferior wall, posterior wall, and mid anterolateral segment are hypokinetic. The anterior septum, basal anterolateral segment, and basal anterior segment are normal. Right Ventricle: The right ventricular size is normal. No increase in right ventricular wall thickness. Right ventricular systolic function is normal. Left Atrium: Left atrial size was mildly dilated. Right Atrium: Right atrial size was normal in size. Pericardium: There is no evidence of pericardial effusion. Mitral Valve: The mitral valve is normal in structure. Trivial mitral valve regurgitation. No evidence of mitral valve stenosis. Tricuspid Valve: The tricuspid valve is normal in structure. Tricuspid valve regurgitation is not demonstrated. No evidence of tricuspid stenosis. Aortic Valve: Calcification mostly affects the non-coronary cusp. The aortic valve is normal in structure. There is moderate calcification of the aortic valve. There is moderate thickening of the aortic valve. Aortic valve regurgitation is not visualized. Aortic valve sclerosis/calcification is present, without any evidence of aortic stenosis. Pulmonic Valve: The pulmonic valve was normal in structure. Pulmonic valve regurgitation is not visualized. No evidence of pulmonic stenosis. Aorta: The aortic root is normal in size and structure. Venous: The inferior vena cava is normal in size with greater than 50% respiratory variability, suggesting right atrial pressure of 3 mmHg. IAS/Shunts: No atrial level shunt detected by color flow Doppler.  LEFT VENTRICLE PLAX 2D                        Biplane EF (MOD) LVIDd:          5.00 cm         LV Biplane EF:   Left LVIDs:         3.70 cm                          ventricular LV PW:         1.10 cm                          ejection LV IVS:        1.20 cm                          fraction by LVOT diam:     1.90 cm                          2D MOD LV SV:         57  biplane is LV SV Index:   31                               33.1 %. LVOT Area:     2.84 cm                                Diastology                                LV e' medial:    7.10 cm/s LV Volumes (MOD)               LV E/e' medial:  10.3 LV vol d, MOD    138.0 ml      LV e' lateral:   6.66 cm/s A2C:                           LV E/e' lateral: 11.0 LV vol d, MOD    116.0 ml A4C: LV vol s, MOD    102.0 ml A2C: LV vol s, MOD    71.0 ml A4C: LV SV MOD A2C:   36.0 ml LV SV MOD A4C:   116.0 ml LV SV MOD BP:    42.5 ml RIGHT VENTRICLE            IVC RV S prime:     5.70 cm/s  IVC diam: 1.70 cm TAPSE (M-mode): 1.2 cm LEFT ATRIUM             Index        RIGHT ATRIUM           Index LA diam:        3.50 cm 1.93 cm/m   RA Area:     11.10 cm LA Vol (A2C):   47.8 ml 26.29 ml/m  RA Volume:   22.00 ml  12.10 ml/m LA Vol (A4C):   53.3 ml 29.32 ml/m LA Biplane Vol: 50.8 ml 27.94 ml/m  AORTIC VALVE LVOT Vmax:   97.90 cm/s LVOT Vmean:  65.000 cm/s LVOT VTI:    0.200 m  AORTA Ao Root diam: 3.50 cm Ao Asc diam:  3.40 cm MITRAL VALVE MV Area (PHT): 3.31 cm    SHUNTS MV Decel Time: 229 msec    Systemic VTI:  0.20 m MV E velocity: 73.20 cm/s  Systemic Diam: 1.90 cm MV A velocity: 78.80 cm/s MV E/A ratio:  0.93 Chilton Si MD Electronically signed by Chilton Si MD Signature Date/Time: 01/14/2023/6:15:09 AM    Final    CARDIAC CATHETERIZATION  Result Date: 01/13/2023   Dist LM to Ost LAD lesion is 90% stenosed.  Prox LAD to Mid LAD lesion is 100% stenosed (after SP1).   LIMA-(D2)-LAD graft was visualized by angiography and is moderate in size.  The graft exhibits no disease.   Mid Cx lesion is 100%  stenosed.   Prox RCA to Dist RCA lesion is 100% stenosed.   Seq SVG- Sequential SVG-OM2-distal\ RCA graft was visualized by angiography and is large.   Mid Graft lesion before 2nd Mrg  is 99% stenosed. -> Balloon angioplasty was performed using a BALLN SAPPHIRE 1.5X20. & Balloon angioplasty was performed using a BALLN SAPPHIRE 1.0X10. = Neither balloon was able to cross the lesion.  Post intervention, there is  a 95% residual stenosis.   Dist Graft lesion before 2nd Mrg  is 80% stenosed.  Remainder of the proximal limb is free of disease until the first anastomosis   First anastomosis is to the 2nd Mrg lesion-> retrograde flow reveals 65% stenosed proximal to SVG insertion site.   Prox Graft lesion between 2nd Mrg and Dist RCA  is 90% stenosed.  Prox Graft to Insertion lesion between 2nd Mrg and Dist RCA is 80% stenosed-diffuse thrombus beyond the 90% stenosis.   There is moderate left ventricular systolic dysfunction.   LV end diastolic pressure is normal.   The left ventricular ejection fraction is 35-45% by visual estimate. Post-Cath Diagnoses: 99% proximal sequential SVG-OM2-d RCA: The culprit lesion appears to be the downstream segment of the sequential vein graft, however unable to cross the upstream 99% in the first leg even with his 1.0 mm balloon.  Likely no percutaneous options. Severe native disease with near ostial LAD occlusion after SP1, as well as proximal RCA, mid LCx occlusion Moderate to mildly reduced EF with regional wall motion abnormalities suggesting basal to mid inferior hypo to akinesis and then inferoapical hypokinesis. Normal LVEDP PLAN OF CARE: Admit to inpatient  -> will start heparin to run 48 hours beginning 8 hours after sheath removal.  Will need to plan medical management of basically occluded distal leg of the vein graft and unable to cross the more proximal limb. Will treat with Brilinta today and then convert on Friday, 01/14/2023 to Plavix treating with 300 mg x 1 and then on  01/15/2023 75 mg daily. Will check lipid panel, A1c and LP(a)-has statin intolerance, is on PCSK9 inhibitor at baseline, this will be continued at discharge. Will hold ACE inhibitor today and restart on 01/14/2023.  Hold HCTZ during hospitalization and continue amlodipine. Monitor heart rate to see if she can tolerate beta-blocker.   DG Chest Port 1 View  Result Date: 01/12/2023 CLINICAL DATA:  [id number] with chest pain radiating to the upper extremities and back. EXAM: PORTABLE CHEST 1 VIEW COMPARISON:  None Available. FINDINGS: The heart is borderline prominent. There are sternotomy sutures. There is calcification in the aortic arch with unremarkable mediastinum. No vascular congestion is seen. The sulci are sharp. The lungs are clear. Osteopenia and thoracic spondylosis. Asymmetric right shoulder DJD. IMPRESSION: 1. No evidence of acute chest disease. 2. Borderline cardiomegaly.  Prior sternotomy. 3. Aortic atherosclerosis. Electronically Signed   By: Almira Bar M.D.   On: 01/12/2023 23:57   Disposition   Pt is being discharged home today in good condition.  Follow-up Plans & Appointments     Follow-up Information     [facility]. Go in 17 day(s).   Specialty: Cardiology Why: Hospital follow up 02/01/2023 @ 2pm PLEASE bring a current medication list to appointment FREE valet parking, Entrance C, off [employer] information: [address] [id number] mc [place] [address] [phone]        Maisie Fus, MD Follow up on 01/25/2023.   Specialty: Cardiology Why: 3:20 pm Contact information: [address] [place] [address] [phone]                Discharge Instructions     Amb Referral to Cardiac Rehabilitation   Complete by: As directed    Diagnosis: STEMI   After initial evaluation and assessments completed: Virtual Based Care may be provided alone or in conjunction with  Phase 2 Cardiac Rehab based  on patient barriers.: Yes   Intensive Cardiac Rehabilitation (ICR) [facility] location only OR Traditional Cardiac Rehabilitation (TCR) *If criteria for ICR are not met will enroll in TCR [facility] only): Yes   Diet - low sodium heart healthy   Complete by: As directed    Discharge instructions   Complete by: As directed    No driving for 2 days. No lifting over 5 lbs for 1 week. No sexual activity for 1 week. Keep procedure site clean & dry. If you notice increased pain, swelling, bleeding or pus, call/return!  You may shower, but no soaking baths/hot tubs/pools for 1 week.   Increase activity slowly   Complete by: As directed         Discharge Medications   Allergies as of 01/16/2023       Reactions   Propoxyphene Nausea And Vomiting   Other reaction(s): Vomiting   Lidocaine-epinephrine    Other reaction(s): Other (See Comments) palpitatins and chest pain   Other Other (See Comments)   sutures - in her hand-very irritated   Tape Rash   BANDAIDS. BANDAIDS   Xylocaine  [lidocaine Hcl]    Other reaction(s): Other (See Comments) palpitatins and chest pain   Statins    Muscle pain and weakness in legs   Jardiance [empagliflozin] Other (See Comments)   Recurrent vaginal infections        Medication List     STOP taking these medications    amLODipine 2.5 MG tablet Commonly known as: NORVASC   Blood Glucose Monitoring Suppl Devi   enalapril 5 MG tablet Commonly known as: VASOTEC   hydrochlorothiazide 25 MG tablet Commonly known as: HYDRODIURIL       TAKE these medications    aspirin 81 MG tablet Take by mouth every evening.   azelastine 0.1 % nasal spray Commonly known as: ASTELIN Place 1 spray into both nostrils 2 (two) times daily. Use in each nostril as directed   b complex vitamins capsule Take 1 capsule by mouth daily.   BD Pen Needle Nano U/F 32G X 4 MM Misc Generic drug: Insulin Pen Needle Use to check glucose once daily    cetirizine 5 MG tablet Commonly known as: ZYRTEC Take 1 tablet (5 mg total) by mouth daily. What changed:  when to take this reasons to take this   clopidogrel 75 MG tablet Commonly known as: PLAVIX Take 1 tablet (75 mg total) by mouth daily.   dapagliflozin propanediol 10 MG Tabs tablet Commonly known as: FARXIGA Take 1 tablet (10 mg total) by mouth daily.   ezetimibe 10 MG tablet Commonly known as: Zetia Take 1 tablet (10 mg total) by mouth daily.   FLUoxetine 40 MG capsule Commonly known as: PROZAC TAKE 1 CAPSULE BY MOUTH  DAILY   furosemide 40 MG tablet Commonly known as: Lasix Take 1 tablet (40 mg) for 3 lb weight gain overnight.   glucose blood test strip Frequency:PHARMDIR   Dosage:0.0     Instructions:  Note:testing 3-4xqd, DX Code 250.00 Dose: 1   BLOOD GLUCOSE TEST STRIPS Strp Use to check glucose twice daily   Levemir FlexPen 100 UNIT/ML FlexPen Generic drug: insulin detemir Inject 16 Units into the skin at bedtime.   levothyroxine 50 MCG tablet Commonly known as: SYNTHROID Take 1 tablet (50 mcg total) by mouth daily before breakfast. What changed: when to take this   losartan 25 MG tablet Commonly known as: COZAAR Take 1 tablet (25 mg total) by mouth daily.  metFORMIN 500 MG 24 hr tablet Commonly known as: GLUCOPHAGE-XR Take 1 tablet (500 mg total) by mouth daily with breakfast. Resume on 01/18/23 Start taking on: January 18, 2023 What changed:  additional instructions These instructions start on January 18, 2023. If you are unsure what to do until then, ask your doctor or other care provider.   metoprolol succinate 25 MG 24 hr tablet Commonly known as: TOPROL-XL Take 1 tablet (25 mg total) by mouth daily.   nitroGLYCERIN 0.4 MG SL tablet Commonly known as: Nitrostat Place 1 tablet (0.4 mg total) under the tongue every 5 (five) minutes as needed for chest pain.   Repatha 140 MG/ML Sosy Generic drug: Evolocumab Inject 140 mg into the skin every 14  (fourteen) days.   sodium chloride 0.65 % Soln nasal spray Commonly known as: OCEAN Place 1 spray into both nostrils as needed for congestion.   triamcinolone cream 0.1 % Commonly known as: KENALOG Apply 1 Application topically daily as needed (for tick bites).   Vitamin D (Ergocalciferol) 1.25 MG (50000 UNIT) Caps capsule Commonly known as: DRISDOL Take 1 capsule (50,000 Units total) by mouth every 7 (seven) days.               Durable Medical Equipment  (From admission, onward)           Start     Ordered   01/14/23 1525  For home use only DME Vest life vest  Once       Comments: Duration 3 months   01/14/23 1524               Outstanding Labs/Studies   Titrate GDMT  BMP  Compliance on repatha?  Duration of Discharge Encounter   Greater than 30 minutes including physician time.  Signed, Roe Rutherford Eleonore Shippee, PA 01/16/2023, 8:35 AM

## 2023-01-16 NOTE — Progress Notes (Signed)
Rounding Note    Patient Name: Angela Rose Date of Encounter: 01/16/2023  [facility] Cardiologist: Maisie Fus, MD   Subjective   Patient is interested in librexa trial. She is on other trial at [facility].   She is doing well today. No chest pain but she as not very active today.   Inpatient Medications    Scheduled Meds:  aspirin EC  81 mg Oral QPM   azelastine  1 spray Each Nare BID   Chlorhexidine Gluconate Cloth  6 each Topical Daily   clopidogrel  75 mg Oral Daily   dapagliflozin propanediol  10 mg Oral Daily   ezetimibe  10 mg Oral Daily   FLUoxetine  40 mg Oral Daily   insulin aspart  0-15 Units Subcutaneous TID WC   insulin detemir  12 Units Subcutaneous QHS   levothyroxine  50 mcg Oral QAC breakfast   loratadine  10 mg Oral Daily   losartan  25 mg Oral Daily   metoprolol tartrate  25 mg Oral BID   sodium chloride flush  3 mL Intravenous Q12H   Continuous Infusions:  sodium chloride     PRN Meds: sodium chloride, acetaminophen, ondansetron (ZOFRAN) IV, mouth rinse, sodium chloride, sodium chloride flush   Vital Signs    Vitals:   01/15/23 1900 01/15/23 2000 01/16/23 0200 01/16/23 0641  BP: (!) 103/48     Pulse: 62     Resp: (!) 21     Temp:  97.9 F (36.6 C) 98 F (36.7 C)   TempSrc:      SpO2: 97%     Weight:    71.2 kg  Height:        Intake/Output Summary (Last 24 hours) at 01/16/2023 0716 Last data filed at 01/15/2023 1600 Gross per 24 hour  Intake --  Output 750 ml  Net -750 ml      01/16/2023    6:41 AM 01/13/2023    3:00 AM 01/12/2023   11:25 PM  Last 3 Weights  Weight (lbs) 156 lb 15.5 oz 159 lb 13.3 oz 158 lb  Weight (kg) 71.2 kg 72.5 kg 71.668 kg      Telemetry    PVCs much less frequent today - Personally Reviewed  ECG    Resolved ST elevations, frequent PVCs - Personally Reviewed  Physical Exam   GEN: No acute distress.  Resting comfortably. Neck: No JVD Cardiac: RRR, normal S1 and S2, possible 1/6  murmur, no LEE Respiratory: Clear to auscultation bilaterally. Breathing comfortably on RA GI: Soft, nontender, non-distended  MS: No edema; No deformity. Neuro:  Nonfocal, alert and oriented  Psych: Normal affect   Labs    High Sensitivity Troponin:   Recent Labs  Lab 01/12/23 2339 01/13/23 0315 01/13/23 0639  TROPONINIHS 7,526* 8,056* 7,941*     Chemistry Recent Labs  Lab 01/12/23 2339 01/13/23 0315 01/14/23 0641 01/15/23 0156 01/16/23 0136  NA 134* 135 136 136 133*  K 3.9 4.2 4.0 3.7 4.1  CL 96* 97* 100 101 103  CO2 26 25 24 24 24   GLUCOSE 441* 279* 163* 135* 166*  BUN 38* 33* 23 24* 32*  CREATININE 1.33* 1.26* 1.20* 1.28* 1.36*  CALCIUM 9.2 9.0 8.5* 8.4* 8.0*  MG  --   --   --  2.2  --   PROT 7.7 6.8  --   --   --   ALBUMIN 4.1 3.5  --   --   --  AST 46* 46*  --   --   --   ALT 20 19  --   --   --   ALKPHOS 110 95  --   --   --   BILITOT 0.5 0.4  --   --   --   GFRNONAA 41* 43* 46* 43* 40*  ANIONGAP 12 13 12 11 6     Lipids  Recent Labs  Lab 01/12/23 2339  CHOL 242*  TRIG 261*  HDL 57  LDLCALC 133*  CHOLHDL 4.2    Hematology Recent Labs  Lab 01/13/23 0315 01/14/23 0641 01/15/23 0156  WBC 8.7 7.0 7.9  RBC 4.07 3.69* 3.72*  HGB 12.1 10.9* 10.8*  HCT 35.4* 32.3* 33.0*  MCV 87.0 87.5 88.7  MCH 29.7 29.5 29.0  MCHC 34.2 33.7 32.7  RDW 13.6 13.6 13.6  PLT 253 240 242   Thyroid No results for input(s): "TSH", "FREET4" in the last 168 hours.  BNPNo results for input(s): "BNP", "PROBNP" in the last 168 hours.  DDimer No results for input(s): "DDIMER" in the last 168 hours.   Radiology  DG Chest Port 1 View  Result Date: 01/12/2023 CLINICAL DATA:  [id number] with chest pain radiating to the upper extremities and back. EXAM: PORTABLE CHEST 1 VIEW COMPARISON:  None Available. FINDINGS: The heart is borderline prominent. There are sternotomy sutures. There is calcification in the aortic arch with unremarkable mediastinum. No vascular congestion is seen.  The sulci are sharp. The lungs are clear. Osteopenia and thoracic spondylosis. Asymmetric right shoulder DJD. IMPRESSION: 1. No evidence of acute chest disease. 2. Borderline cardiomegaly.  Prior sternotomy. 3. Aortic atherosclerosis. Electronically Signed   By: Almira Bar M.D.   On: 01/12/2023 23:57    Cardiac Studies   ECHO pending  01/13/23 Cardiac cath: dLM-ost LAD 90%, pLAD-mLAD (after SP1) 100% CTO.   mLCx 100% CTO after OM1    Prox RCA to Dist RCA lesion is 100% stenosed.    LIMA-(D2)-LAD normal / small dLAD.   Seq SVG-OM2-distal\ RCA graft .> 90% focal eccentric heavily calcified (unable to cross with 1.0 mm balloon) followed by 80% stenosis (similar-appearing)   First anastomosis is to the 2nd Mrg lesion-> retrograde flow reveals 65% stenosed proximal to SVG ,   90% focal concentric lesion after OM2 anastomosis diffuse 8085% thrombotic occlusion leading to the insertion at distal RCA.    Moderate reduced EF of 35 to 40% with normal EDP.    Post-Cath Diagnoses: 99% proximal sequential SVG-OM2-d RCA: The culprit lesion appears to be the downstream segment of the sequential vein graft, however unable to cross the upstream 99% in the first leg even with his 1.0 mm balloon.  Likely no percutaneous options.     Patient Profile     77 y.o. female former smoker with history of CAD (CABG x 3 in 1996), PAD/carotid disease, DM-2, HTN and HLD who is being seen 01/13/2023 for the evaluation of inferior ST elevation myocardial infarction   Assessment & Plan    Principal Problem:   Acute ST elevation myocardial infarction (STEMI) of inferior wall Active Problems:   History of colon polyps   Essential (primary) hypertension   Type II diabetes mellitus with complication   Hyperlipidemia associated with type 2 diabetes mellitus   Hx of CABG   Coronary artery disease involving native coronary artery of native heart with unstable angina pectoris   Coronary artery disease involving coronary bypass  graft of native heart with angina pectoris  CHF (congestive heart failure), NYHA class II, acute on chronic, combined   Ischemic cardiomyopathy   At risk for sudden cardiac death   Acute inferior STEMI STEMI CAD s/p prior CABG - > CAD with native vessel and graft disease. She has diffuse severe disease on cath. She has LIMA and prior vein graft. The culprit lesion is distal end of vein graft with proximal stenosis that cannot be crossed. This is not amenable to intervention. She will be treated medically. LIMA predominant vascular supply for her heart. She has been stable here.   - 48 hours of enoxaparin -- completed - continuing on DAPT with aspirin and plavix.  - Zetia.   40 cc; 1 she has been discussing starting on the librexia trial.  Which would potentially be beneficial help with her only having 1 remaining graft conduit.  HFrEF - ischemic ECHo showed LVEF 30-35% with extensive hypokinesis.   - will need entresto after ACE-I washout. Last dose of enalapril was 4/5 - trial of farxiga (did have trouble with empagliflozin in the past) - switch metoprolol to succinate  HLD Ldl 133, very poorly controlled Lpa 201 - statin intolerance, is on PCSK9 inhibitor at baseline  -?  Compliance? - zetia resumed  DM2 - recent 9.4 a1c - ssi and levemir 12 QHS here (return to home dosing on discharge), holding metformin - starting SGLT2 - consider GLP outpatient  HTN BP better this afternoon - stop amlodipine - continue losartan 25 - continue metop 25  CODE Status - broached topic of DNR/DNI. Patient and family are still wanting to be full code. They do acknowledge her significant heart disease. Will need to have continued discussions in outpatient setting.   She has done well yesterday on Farxiga and losartan. Ok to discharge today. Discharge note to follow.   For questions or updates, please contact [facility] Please consult [email or web address] for contact info under      Signed, Maurice SmallAugustus E Lennie Dunnigan, MD  01/16/2023, 7:16 AM

## 2023-01-17 ENCOUNTER — Ambulatory Visit: Payer: Self-pay

## 2023-01-17 ENCOUNTER — Telehealth: Payer: Self-pay

## 2023-01-17 NOTE — Chronic Care Management (AMB) (Signed)
   01/17/2023  Angela Rose Mar 03, 1946 [id number]   Reason for Encounter: Patient is not currently enrolled in the CCM program. CCM status changed to previously enrolled  Alto Denver RN, MSN, CCM RN Care Manager  Chronic Care Management Direct Number: [phone]

## 2023-01-17 NOTE — Transitions of Care (Post Inpatient/ED Visit) (Signed)
   01/17/2023  Name: Angela Rose MRN: [id number] DOB: [birth year]  Today's TOC FU Call Status: Today's TOC FU Call Status:: Unsuccessul Call (1st Attempt) Unsuccessful Call (1st Attempt) Date: 01/17/23  Attempted to reach the patient regarding the most recent Inpatient/ED visit.  Follow Up Plan: Additional outreach attempts will be made to reach the patient to complete the Transitions of Care (Post Inpatient/ED visit) call.   Jodelle Gross, RN, BSN, CCM Care Management Coordinator [facility] Phone: [phone]/Fax: [phone]

## 2023-01-18 ENCOUNTER — Telehealth: Payer: Self-pay

## 2023-01-18 ENCOUNTER — Telehealth: Payer: Self-pay | Admitting: *Deleted

## 2023-01-18 NOTE — Transitions of Care (Post Inpatient/ED Visit) (Signed)
   01/18/2023  Name: Angela Rose MRN: [id number] DOB: January 21, 1946  Today's TOC FU Call Status: Today's TOC FU Call Status:: Unsuccessful Call (2nd Attempt) Unsuccessful Call (2nd Attempt) Date: 01/18/23  Attempted to reach the patient regarding the most recent Inpatient/ED visit. TOC Interventions Today    Flowsheet Row Most Recent Value  TOC Interventions   TOC Interventions Discussed/Reviewed Contacted provider for patient needs  Coralyn Pear out to [facility] re: TOC call. Discharge notes indicated that they would call patient but, no TOC call attempts documented. Reviewed scheduled appoitments with cardiology.]      Follow Up Plan: Additional outreach attempts will be made to reach the patient to complete the Transitions of Care (Post Inpatient/ED visit) call.   Demetrios Loll, BSN, RN-BC RN Care Coordinator [facility]  [facility] Direct Dial: [phone] Main #: [phone]

## 2023-01-18 NOTE — Telephone Encounter (Signed)
Left voicemail to call office prior to [facility] appt.

## 2023-01-19 ENCOUNTER — Encounter: Payer: Medicare HMO | Admitting: *Deleted

## 2023-01-19 VITALS — BP 122/49 | HR 60 | Resp 14 | Ht 66.0 in | Wt 158.0 lb

## 2023-01-19 DIAGNOSIS — Z006 Encounter for examination for normal comparison and control in clinical research program: Secondary | ICD-10-CM

## 2023-01-19 MED ORDER — STUDY - LIBREXIA-ACS - MILVEXIAN 25 MG OR PLACEBO TABLET (PI-STUCKEY)
1.0000 | ORAL_TABLET | Freq: Two times a day (BID) | ORAL | Status: DC
  Filled 2023-01-19: qty 1

## 2023-01-19 MED ORDER — STUDY - LIBREXIA-ACS - MILVEXIAN 25 MG OR PLACEBO TABLET (PI-STUCKEY): 1.0000 | ORAL_TABLET | Freq: Two times a day (BID) | ORAL | 0 refills | Status: DC

## 2023-01-19 NOTE — Research (Addendum)
 [facility] ACS Informed Consent   Subject Name: Angela Rose  Subject met inclusion and exclusion criteria.  The informed consent form, study requirements and expectations were reviewed with the subject and questions and concerns were addressed prior to the signing of the consent form.  The subject verbalized understanding of the trial requirements.  The subject agreed to participate in the [place] ACS trial and signed the informed consent at 1315 on 10/Apr/2024.  The informed consent was obtained prior to performance of any protocol-specific procedures for the subject.  A copy of the signed informed consent was given to the subject and a copy was placed in the subject's medical record.   Rosaline Rancher, RN    Were all eligibility criteria met? [x] Yes [] No   Pregnancy Test Was the sample collected? [] Yes [] No [x] N/A Was the collection date the same as the visit date? [] Yes [] No Specimen Type: [] Serum [] Urine Collection Date: Collection Time: Pregnancy Test Result: [] Positive [] Negative [] Borderline [] Invalid   Randomization What was the date of randomization?: 10/Apr/2024 What was the time of randomization?: 1424 PD sampling Value: Yes PK Sampling Value: Yes   Central [facility] Hematology/Chemistry drawn: [x] Yes [] No   Study Drug Administration Was dose administered?  [x] Yes [] No Time dose administered: 1508   Medication Kit Accountability Dispensed Status [x] Dispensed [] Dispensed In Error [] Not Dispensed If 'Not Dispensed', provide reason: [] Medical Decision [] Medication Kit not available or damaged [] Study medication discontinued [] Visit skipped or administration skipped [] Medication kit dispensed in error Date Dispensed: Amount Dispensed: 10/Apr/2024 [id number] (Milvexian) 25mg  or Placebo -   [id number]   Physical Exam Was the physical examination performed? [x] Yes [] No Was the examination date the same as the visit date? [x] Yes [] No   EQ-5D-5L Assessment  Completed? [x] Yes [] No Reason not done: [] Subject Forgot [] Subject too ill [] Subject refused [] Technical failure [] Other If Other, Specify Collection Date    Are there any labs that are clinically significant?  Yes []  OR No[x]   Is the patient eligible to continue enrollment in the study after screening visit?  Yes [x]   OR No[]            Labs reviewed.  Cystatin C c/w reduced GFR.  Current GFR qualifies for study.    Debby Como, MD [facility].  Medical Director, [facility]     Current Outpatient Medications:    aspirin  81 MG tablet, Take by mouth every evening. , Disp: , Rfl:    azelastine  (ASTELIN ) 0.1 % nasal spray, Place 1 spray into both nostrils 2 (two) times daily. Use in each nostril as directed, Disp: 30 mL, Rfl: 1   b complex vitamins capsule, Take 1 capsule by mouth daily., Disp: , Rfl:    clopidogrel  (PLAVIX ) 75 MG tablet, Take 1 tablet (75 mg total) by mouth daily., Disp: 90 tablet, Rfl: 3   dapagliflozin  propanediol (FARXIGA ) 10 MG TABS tablet, Take 1 tablet (10 mg total) by mouth daily., Disp: 30 tablet, Rfl: 11   Evolocumab  (REPATHA ) 140 MG/ML SOSY, Inject 140 mg into the skin every 14 (fourteen) days., Disp: 2.1 mL, Rfl: 3   ezetimibe  (ZETIA ) 10 MG tablet, Take 1 tablet (10 mg total) by mouth daily., Disp: 90 tablet, Rfl: 3   FLUoxetine  (PROZAC ) 40 MG capsule, TAKE 1 CAPSULE BY MOUTH  DAILY, Disp: 90 capsule, Rfl: 3   furosemide  (LASIX ) 40 MG tablet, Take 1 tablet (40 mg) for 3 lb weight gain overnight., Disp: 30 tablet, Rfl: 11   Glucose Blood (BLOOD GLUCOSE TEST STRIPS) STRP, Use to check glucose  twice daily, Disp: 100 strip, Rfl: 11   glucose blood test strip, Frequency:PHARMDIR   Dosage:0.0     Instructions:  Note:testing 3-4xqd, DX Code 250.00 Dose: 1, Disp: , Rfl:    insulin  detemir (LEVEMIR  FLEXPEN) 100 UNIT/ML FlexPen, Inject 16 Units into the skin at bedtime., Disp: 15 mL, Rfl: 3   Insulin  Pen Needle (BD PEN NEEDLE NANO U/F) 32G X 4 MM MISC, Use  to check glucose once daily, Disp: 100 each, Rfl: 6   levothyroxine  (SYNTHROID ) 50 MCG tablet, Take 1 tablet (50 mcg total) by mouth daily before breakfast. (Patient taking differently: Take 50 mcg by mouth at bedtime.), Disp: 90 tablet, Rfl: 3   metFORMIN  (GLUCOPHAGE -XR) 500 MG 24 hr tablet, Take 1 tablet (500 mg total) by mouth daily with breakfast. Resume on 01/18/23, Disp: 90 tablet, Rfl: 3   metoprolol  succinate (TOPROL -XL) 25 MG 24 hr tablet, Take 1 tablet (25 mg total) by mouth daily., Disp: 90 tablet, Rfl: 3   nitroGLYCERIN  (NITROSTAT ) 0.4 MG SL tablet, Place 1 tablet (0.4 mg total) under the tongue every 5 (five) minutes as needed for chest pain., Disp: 25 tablet, Rfl: 3   sodium chloride  (OCEAN) 0.65 % SOLN nasal spray, Place 1 spray into both nostrils as needed for congestion. (Patient not taking: Reported on 01/25/2023), Disp: 30 mL, Rfl: 0   Study - LIBREXIA-ACS - milvexian 25 mg or placebo tablet (PI-Stuckey), Take 1 tablet by mouth 2 (two) times daily. Take 1 tablet by mouth twice a day with or without food. Bring bottle back to every visit. Please contact [place] [facility] for any questions or concerns regarding the medication, Disp: 70 tablet, Rfl: 0   Vitamin D , Ergocalciferol , (DRISDOL ) 1.25 MG (50000 UNIT) CAPS capsule, Take 1 capsule (50,000 Units total) by mouth every 7 (seven) days., Disp: 10 capsule, Rfl: 1   cetirizine  (ZYRTEC ) 5 MG tablet, Take 1 tablet (5 mg total) by mouth daily. (Patient taking differently: Take 5 mg by mouth daily as needed for allergies.), Disp: 30 tablet, Rfl: 0   sacubitril -valsartan  (ENTRESTO ) 24-26 MG, Take 1 tablet by mouth 2 (two) times daily., Disp: 180 tablet, Rfl: 3   spironolactone  (ALDACTONE ) 25 MG tablet, Take 0.5 tablets (12.5 mg total) by mouth daily., Disp: 45 tablet, Rfl: 3   triamcinolone cream (KENALOG) 0.1 %, Apply 1 Application topically daily as needed (for tick bites)., Disp: , Rfl:

## 2023-01-19 NOTE — Telephone Encounter (Signed)
Patient is returning call and is requesting return call.  

## 2023-01-19 NOTE — Progress Notes (Signed)
This patient comes in following a recent MI for possible enrollment in the [place] ACS trial which compares Milvexian to placebo in patients with recent ACS.  The patient has known CAD and underwent CABG in 1993.  She recently presented with an acute MI and had 99% stenosis of the proximal SVG SVG-OM2-dRCA. LIMA patient.  EF 30-35 with [place] Class II.   Balloon intervention was unsuccessful.  She spoke with Dr. Herbie Baltimore who also rounded on the patient.  She is getting along reasonably well since discharge.  Of note, she has been in a long term study at [facility] in diabetes which she wanted to come out of since it is ending next month, and she exited the study yesterday.  She discontinued IP more than two years ago as she was pretty sure she was on placebo.   As part of this, she had an exam in 2016 suggesting diabetic retinopathy, but has never known about any ocular bleeding nor was it noted then, and she just had an examine and was surprised when I mentioned this.  She said her ocular exam looked goodj.  After speaking with Dr. Herbie Baltimore she wants to proceed. Further questioning reveals she is not unsteady on her feet, or prone to falling.   We reviewed in great detail the objective, what is know, risks and benefits. She would like to proceed in the trial.  We reviewed the inclusion and exclusion criteria in detail.  She wishes to proceed with enrollment.  Exam  Alert, oriented in NAD I do not appreciate carotid bruits BP 122/49 P6 0 R 14 T normal Lungs clear to AP Cor regular without significant murmur Abdomen soft Ext no edema.  Good refill.  Pulses not well palpable Neuro non focal.   SP MI with DM, prior CABG, age >35 with index ACS event  Consent signed.  More than 30 minutes of patient facing time.   Arturo Morton. Riley Kill, MD, [facility] Medical Director, [facility]

## 2023-01-19 NOTE — Telephone Encounter (Signed)
Patient contacted regarding discharge from [facility] on 01/15/2022.  Patient understands to follow up with provider Dr. Wyline Mood on April 16th at 3:20pm at [facility] office. Patient understands discharge instructions? yes Patient understands medications and regiment? yes Patient understands to bring all medications to this visit? no  Are you enrolled in My Chart - yes. Reset password for pt.   Pt had to end the call, she needed to go to an appointment.   Pt states she was exposed to Covid yesterday, denies symptoms.

## 2023-01-21 ENCOUNTER — Telehealth: Payer: Self-pay | Admitting: Emergency Medicine

## 2023-01-21 NOTE — Telephone Encounter (Signed)
Called for TOC, and pt's husband answered phone- said she was not awake yet. Reports that she is doing well, no concerns, and is aware of upcoming appt 01/25/23 at 320 with Dr Wyline Mood.  Pt's husband said that he needed a f/u appt with Dr Wyline Mood, so made him a f/u appt.  Gave him the number for his wife to call back for [facility] after she gets up.

## 2023-01-21 NOTE — Telephone Encounter (Signed)
Patient contacted regarding discharge from [facility] on 01/16/23.     Called 3 times, patient returned call on 01/21/23;   Patient understands to follow up with provider Dr Wyline Mood on 01/25/23 at 1520 Patient understands discharge instructions? yes Patient understands medications and regiment? yes Patient understands to bring all medications to this visit? yes   Ask patient:  Are you enrolled in My Chart (yes or no)  If no ask patient if they would like to enroll. Yes, she has MyChart  Pt states that she is feeling good and will see [place] at her f/u appt 01/25/23

## 2023-01-25 ENCOUNTER — Ambulatory Visit: Payer: Medicare HMO | Attending: Internal Medicine | Admitting: Internal Medicine

## 2023-01-25 ENCOUNTER — Encounter: Payer: Self-pay | Admitting: *Deleted

## 2023-01-25 VITALS — BP 130/67 | HR 61 | Ht 66.0 in | Wt 160.2 lb

## 2023-01-25 DIAGNOSIS — E785 Hyperlipidemia, unspecified: Secondary | ICD-10-CM

## 2023-01-25 DIAGNOSIS — I5021 Acute systolic (congestive) heart failure: Secondary | ICD-10-CM

## 2023-01-25 DIAGNOSIS — Z79899 Other long term (current) drug therapy: Secondary | ICD-10-CM | POA: Diagnosis not present

## 2023-01-25 MED ORDER — SPIRONOLACTONE 25 MG PO TABS: 12.5000 mg | ORAL_TABLET | Freq: Every day | ORAL | 3 refills | Status: DC

## 2023-01-25 MED ORDER — SACUBITRIL-VALSARTAN 24-26 MG PO TABS: 1.0000 | ORAL_TABLET | Freq: Two times a day (BID) | ORAL | 3 refills | Status: DC

## 2023-01-25 NOTE — Patient Instructions (Addendum)
Medication Instructions:   STOP Losartan  START Entresto -26 mg 2 times a day- Take 1 tablet by mouth 2 times a day  START Spironolactone 12.5 mg daily  *If you need a refill on your cardiac medications before your next appointment, please call your pharmacy*  Lab Work: Your physician recommends that you return for lab work in 2 weeks:  BMP  Your physician recommends that you return for lab work in 3 months (04/26/23):  Fasting Lipid Panel-DO NOT eat or drink past midnight. Okay to have water and/or black coffee only the morning of lab work.   If you have labs (blood work) drawn today and your tests are completely normal, you will receive your results only by: MyChart Message (if you have MyChart) OR A paper copy in the mail If you have any lab test that is abnormal or we need to change your treatment, we will call you to review the results.  Testing/Procedures: Your physician has requested that you have an echocardiogram. Echocardiography is a painless test that uses sound waves to create images of your heart. It provides your doctor with information about the size and shape of your heart and how well your heart's chambers and valves are working. This procedure takes approximately one hour. There are no restrictions for this procedure. Please do NOT wear cologne, perfume, aftershave, or lotions (deodorant is allowed). Please arrive 15 minutes prior to your appointment time.  Please scheduled for 3 months   Follow-Up: At [facility], you and your health needs are our priority.  As part of our continuing mission to provide you with exceptional heart care, we have created designated Provider Care Teams.  These Care Teams include your primary Cardiologist (physician) and Advanced Practice Providers (APPs -  Physician Assistants and Nurse Practitioners) who all work together to provide you with the care you need, when you need it.  Your next appointment:   6  month(s)  Provider:   Maisie Fus, MD     Other Instructions

## 2023-01-25 NOTE — Progress Notes (Signed)
Cardiology Office Note:    Date:  01/25/2023   ID:  Angela Rose, DOB 06/08/1946, MRN [id number]  PCP:  Gilmore Laroche, FNP   [facility] Providers Cardiologist:  Maisie Fus, MD     Referring MD: Gilmore Laroche, FNP   No chief complaint on file.  Establish Care  History of Present Illness:    Angela Rose is a 77 y.o. female with a hx of MI in 1993 CABG 1993 3v 1996, HTN, HLD, PAD, former smoker, carotid atherosclerosis, no hx of stroke, previously followed at [facility] w/ Dr. Gwen Pounds when she lived in [place] she has since moved to [place]  referral to establish care here  In March 12/25/2018 she had EKG per report during an annual visit and it showed ?atrial fibrillation with a HR of 74. She has CHADS2VASC of 5. She was reluctant to start anticoagulation. She was started on ASA. She saw cardiology at [facility] 01/16/2019 who did an event monitor and it showed PACs, PVCs, asymptomatic bradycardia, sinus tachycardia but no atrial fibrillation. No captured data c/w atrial fibrillation in the cardiology notes.   She was noted to have hx of 3v CABG in 1996, she notes mild MI 1993 with stents.. She has hx of PVD and carotid atherosclerosis that is stable. She was started on ASA 81 mg daily and statin.She had a stress test that was normal 01/10/2019. She denies palpitations. No chest pain. No pnd , orthopnea, or LE edema. She notes she is fatigued. She's been under a lot of stress..   For her hypertension she is managed on norvasc 2.5 mg daily, HCTz 25 mg daily and enalapril 5 mg BID. Her blood pressure is well controlled per report, it's high today. Its typically 115-125 mg.   For HLD she's on crestor 5 mg daily he has T2DM  on metformin 500 mg BID Shes on jardiance.   She's here with husband from [place], now they are in [place].   Labs: 04/27/2021 LDL 108 TC 192 TSH 6.14  A1c 8.9%    Interim Hx 12/28/2021: [name] presents for follow up. She notes some  allergy symptoms. No syncope. SOB, or chest pressure. She feels overall fatigued and worried it may be due to thyroid v aging  Interim Hx 07/06/2022 Lipids were not at goal. Her crestor increased to 40 and she started zetia with her PCP. She stopped crestor inadvertently when she started zetia.  No CP/SOB. She denies orthopnea/PND or LE edema.  Interim Hx 01/25/2023 She presented to the hospital with an inferior STEMI. After her last visit, she started crestor and zetia. Her LDL remained high. Started repatha. She then came in with acute CP, radiating to both arms. EKG showed inferior STE. She has significant native disease. Prox graft SVG-OM2-dca (culprit) was unable to be ballooned. She given a a life vest   Cardiology Studies  TTE 07/23/2020 at [facility]  NORMAL LEFT VENTRICULAR SYSTOLIC FUNCTION WITH MILD LVH NORMAL RIGHT VENTRICULAR SYSTOLIC FUNCTION MILD VALVULAR REGURGITATION (See above) NO VALVULAR STENOSIS MILD MR, TR, PR EF 50%  LHC 01/13/2023 Dist LM to Ost LAD lesion is 90% stenosed.  Prox LAD to Mid LAD lesion is 100% stenosed (after SP1).   LIMA-(D2)-LAD graft was visualized by angiography and is moderate in size.  The graft exhibits no disease.   Mid Cx lesion is 100% stenosed.   Prox RCA to Dist RCA lesion is 100% stenosed.   Seq SVG- Sequential SVG-OM2-distal\ RCA graft was visualized by angiography  and is large.   Mid Graft lesion before 2nd Mrg  is 99% stenosed. -> Balloon angioplasty was performed using a BALLN SAPPHIRE 1.5X20. & Balloon angioplasty was performed using a BALLN SAPPHIRE 1.0X10. = Neither balloon was able to cross the lesion.  Post intervention, there is a 95% residual stenosis.   Dist Graft lesion before 2nd Mrg  is 80% stenosed.  Remainder of the proximal limb is free of disease until the first anastomosis   First anastomosis is to the 2nd Mrg lesion-> retrograde flow reveals 65% stenosed proximal to SVG insertion site.   Prox Graft lesion between 2nd Mrg and  Dist RCA  is 90% stenosed.  Prox Graft to Insertion lesion between 2nd Mrg and Dist RCA is 80% stenosed-diffuse thrombus beyond the 90% stenosis.   There is moderate left ventricular systolic dysfunction.   LV end diastolic pressure is normal.   The left ventricular ejection fraction is 35-45% by visual estimate.   TTE 01/13/2023- EF 30-35%, RV is normal,  trivial MR,  Past Medical History:  Diagnosis Date   Anxiety    Cancer    melanoma face   Depression    Diabetes mellitus without complication    Fracture of tibial plateau 12/31/2015   Hyperlipidemia    Hypertension 09/03/2014    Past Surgical History:  Procedure Laterality Date   ABDOMINAL HYSTERECTOMY     APPENDECTOMY     BASAL CELL CARCINOMA EXCISION     BREAST BIOPSY Right    needle bx-neg   CATARACT EXTRACTION, BILATERAL Bilateral 11/23/2018   CORONARY ARTERY BYPASS GRAFT  1996   x 3   CORONARY/GRAFT ACUTE MI REVASCULARIZATION N/A 01/13/2023   Procedure: Coronary/Graft Acute MI Revascularization;  Surgeon: Marykay Lex, MD;  Location[facility] INVASIVE CV LAB;  Service: Cardiovascular;  Laterality: N/A;   LEFT HEART CATH AND CORONARY ANGIOGRAPHY N/A 01/13/2023   Procedure: LEFT HEART CATH AND CORONARY ANGIOGRAPHY;  Surgeon: Marykay Lex, MD;  Location: [facility] INVASIVE CV LAB;  Service: Cardiovascular;  Laterality: N/A;   MELANOMA EXCISION     PARTIAL HYSTERECTOMY  1976   ovaries remain    Current Medications: No outpatient medications have been marked as taking for the 01/25/23 encounter (Appointment) with Maisie Fus, MD.     Allergies:   Propoxyphene, Lidocaine-epinephrine, Other, Tape, Xylocaine  [lidocaine hcl], Statins, and Jardiance [empagliflozin]   Social History   Socioeconomic History   Marital status: Significant Other    Spouse name: Not on file   Number of children: 3   Years of education: Not on file   Highest education level: Some college, no degree  Occupational History   Occupation: retired   Tobacco Use   Smoking status: Former    Types: Cigarettes    Quit date: 06/11/1982    Years since quitting: 40.6   Smokeless tobacco: Never   Tobacco comments:    smoking cessation materials not required  Vaping Use   Vaping Use: Never used  Substance and Sexual Activity   Alcohol use: Yes    Alcohol/week: 0.0 standard drinks of alcohol    Comment: rare 1 wine glass of wine   Drug use: No   Sexual activity: Not on file  Other Topics Concern   Not on file  Social History Narrative   Not on file   Social Determinants of Health   Financial Resource Strain: Low Risk  (01/14/2023)   Overall Financial Resource Strain (CARDIA)    Difficulty of Paying Living  Expenses: Not very hard  Food Insecurity: No Food Insecurity (01/13/2023)   Hunger Vital Sign    Worried About Running Out of Food in the Last Year: Never true    Ran Out of Food in the Last Year: Never true  Transportation Needs: No Transportation Needs (01/13/2023)   PRAPARE - Administrator, Civil Service (Medical): No    Lack of Transportation (Non-Medical): No  Physical Activity: Inactive (12/14/2021)   Exercise Vital Sign    Days of Exercise per Week: 0 days    Minutes of Exercise per Session: 0 min  Stress: Stress Concern Present (12/14/2021)   [employer] of Occupational Health - Occupational Stress Questionnaire    Feeling of Stress : To some extent  Social Connections: Moderately Isolated (12/14/2021)   Social Connection and Isolation Panel [NHANES]    Frequency of Communication with Friends and Family: More than three times a week    Frequency of Social Gatherings with Friends and Family: Once a week    Attends Religious Services: Never    Database administrator or Organizations: No    Attends Engineer, structural: Never    Marital Status: Living with partner     Family History: The patient's family history includes Basal cell carcinoma in her mother; CAD in her father; Heart disease in her  father; Skin cancer in her mother; Squamous cell carcinoma in her mother. There is no history of Breast cancer.  ROS:   Please see the history of present illness.     All other systems reviewed and are negative.  EKGs/Labs/Other Studies Reviewed:    The following studies were reviewed today:   EKG:  EKG is  ordered today.  The ekg ordered today demonstrates   Sinus with PACs,1st degree AV block, PVC  Recent Labs: 12/27/2022: TSH 2.160 01/13/2023: ALT 19 01/15/2023: Hemoglobin 10.8; Magnesium 2.2; Platelets 242 01/16/2023: BUN 32; Creatinine, Ser 1.36; Potassium 4.1; Sodium 133   Recent Lipid Panel    Component Value Date/Time   CHOL 242 (H) 01/12/2023 2339   CHOL 325 (H) 12/06/2022 1116   TRIG 261 (H) 01/12/2023 2339   HDL 57 01/12/2023 2339   HDL 51 12/06/2022 1116   CHOLHDL 4.2 01/12/2023 2339   VLDL 52 (H) 01/12/2023 2339   LDLCALC 133 (H) 01/12/2023 2339   LDLCALC 247 (H) 12/06/2022 1116     Risk Assessment/Calculations:         Physical Exam:    VS:   Vitals:   01/25/23 1524  BP: 130/67  Pulse: 61  SpO2: 98%     Wt Readings from Last 3 Encounters:  01/19/23 158 lb (71.7 kg)  01/16/23 156 lb 15.5 oz (71.2 kg)  12/28/22 158 lb 9.6 oz (71.9 kg)     GEN:  Well nourished, well developed in no acute distress HEENT: Normal NECK: No JVD;  LYMPHATICS: No lymphadenopathy CARDIAC: RRR, II/IV systolic ejection murmur, rubs, gallops RESPIRATORY:  Clear to auscultation without rales, wheezing or rhonchi  VASC: no hematoma at the groin site ABDOMEN: Soft, non-tender, non-distended MUSCULOSKELETAL:  No edema; No deformity  SKIN: Warm and dry NEUROLOGIC:  Alert and oriented x 3 PSYCHIATRIC:  Normal affect   ASSESSMENT:    #Ischemic Systolic HF NYHA Class II:  EF 30-35% Hx of CABG per above. Now s/p recent inferior STEMI unsuccessful ballooning of SVG-OM-dRCA culprit severe lesion. She has significant native disease. Had Wenckebach on her zio, so BB was held in the  past.  Tolerated in the hospital. - no CHF symptoms, euvolemic. Wt today 160 pounds - continue asa/plavix  - continue metop succinate XL 25 mg daily - changed losartan to entresto - start spironolactone 12.5 mg daily - continue lasix 40 mg daily PRN - only 2nd dose of Repatha , continue zetia; LDL 133 01/12/3023, will repeat after more doses - potentially on study drug Milvexian  - continue life vest, will repeat TTE in 3 months to look for EF recovery; if none, recommend primary prevention ICD  #DM2: A1c 9.4%, A1c goal <7. Had yeast infections with Jardiance. On insulin/metformin. Sees endocrinology  #HTN: well controlled  #Elevated TSH: Noted in December. FU free T4 was normal.   PLAN:    In order of problems listed above:  Change losartan to entresto 24-26 mg BID, start spironolactone 12.5 mg daily BMET in 2 weeks TTE in 3 months FU Fasting lipids fasting  Follow up in 6 months    Cardiac Rehabilitation Eligibility Assessment       Medication Adjustments/Labs and Tests Ordered: Current medicines are reviewed at length with the patient today.  Concerns regarding medicines are outlined above.   Signed, Maisie Fus, MD  01/25/2023 3:17 PM    [facility]

## 2023-02-01 ENCOUNTER — Encounter (HOSPITAL_COMMUNITY): Payer: Medicare HMO

## 2023-02-04 ENCOUNTER — Telehealth (HOSPITAL_COMMUNITY): Payer: Self-pay | Admitting: *Deleted

## 2023-02-04 ENCOUNTER — Encounter (HOSPITAL_COMMUNITY): Payer: Medicare HMO

## 2023-02-04 NOTE — Telephone Encounter (Signed)
Pt sch for H&V TOC appt today, however she saw Dr Wyline Mood on 4/16, GDMT initiated/titrated & repeat echo sch for July.  Called and discussed w/pt, no additional recommendations/changes to be made at this time per Anna Genre, PA. Pt agreeable to cancel appt today, we reviewed importance of daily weights and meds, she is aware and agreeable when to call MD (Dr Wyline Mood) for issues/concerns. Pt thankful for time and info.

## 2023-02-14 ENCOUNTER — Telehealth: Payer: Self-pay | Admitting: Family Medicine

## 2023-02-14 ENCOUNTER — Other Ambulatory Visit: Payer: Self-pay

## 2023-02-14 NOTE — Telephone Encounter (Signed)
Please advice on any medication changes?

## 2023-02-14 NOTE — Telephone Encounter (Signed)
Patient asking can she switch Dr Durwin Nora MD instead of Gilmore Laroche due to not well communication .

## 2023-02-14 NOTE — Telephone Encounter (Signed)
Metformin 500 mg daily

## 2023-02-14 NOTE — Telephone Encounter (Signed)
Dr Fransico Him took patient off of metformin 1000 mg . Please contact patient to confirm what she should take only Metformin  500 mg and not 1000 mg. Patient is concerned.

## 2023-02-14 NOTE — Telephone Encounter (Signed)
Patient came by the office waiting on a refill on  FLUoxetine (PROZAC) 40 MG capsule   Patient no longer gets from Dr Aris Everts in [place], patient is now seeing Gilmore Laroche as her new PCP.  Does patient need to get this from her heart doctor, if so please contact patient back at [phone]. Patient is completely out needs this medicine today if at all possible.

## 2023-02-14 NOTE — Telephone Encounter (Signed)
Please ask the patient to schedule an appt as the medication has not been refilled since 11/2021

## 2023-02-14 NOTE — Telephone Encounter (Signed)
That's fine

## 2023-02-14 NOTE — Telephone Encounter (Signed)
Has not been filled by your before okay to fill?

## 2023-02-15 ENCOUNTER — Telehealth: Payer: Self-pay | Admitting: Family Medicine

## 2023-02-15 ENCOUNTER — Telehealth: Payer: Self-pay | Admitting: Internal Medicine

## 2023-02-15 DIAGNOSIS — F324 Major depressive disorder, single episode, in partial remission: Secondary | ICD-10-CM

## 2023-02-15 DIAGNOSIS — I213 ST elevation (STEMI) myocardial infarction of unspecified site: Secondary | ICD-10-CM | POA: Diagnosis not present

## 2023-02-15 MED ORDER — FLUOXETINE HCL 40 MG PO CAPS: 40.0000 mg | ORAL_CAPSULE | Freq: Every day | ORAL | 3 refills | Status: DC

## 2023-02-15 NOTE — Telephone Encounter (Signed)
Spoke to pt, call was dropped the times I spoke with her.

## 2023-02-15 NOTE — Telephone Encounter (Signed)
Attempted to call pt back busy dial tone. 

## 2023-02-15 NOTE — Addendum Note (Signed)
Addended by: Christel Mormon E on: 02/15/2023 01:00 PM   Modules accepted: Orders

## 2023-02-15 NOTE — Telephone Encounter (Signed)
Spoke to pt husband asked for me to call back .  

## 2023-02-15 NOTE — Telephone Encounter (Signed)
*  STAT* If patient is at the pharmacy, call can be transferred to refill team.   1. Which medications need to be refilled? (please list name of each medication and dose if known) new prescription for Fluoxetine- need Dr Wyline Mood to write this please- she no longer see  2. Which pharmacy/location (including street and city if local pharmacy) is medication to be sent to?[facility]  [address], [place]  3. Do they need a 30 day or 90 day supply? 90 days and refills- need this today if possible please- she is out of it please

## 2023-02-15 NOTE — Telephone Encounter (Signed)
Lmtrc

## 2023-02-15 NOTE — Telephone Encounter (Signed)
Pt called In regards to pt meds, please follow up asap.

## 2023-02-15 NOTE — Telephone Encounter (Signed)
Angela Rose called from [facility] pharmacy on [address] asking for a paper med list they have not yet received the prescription from Gilmore Laroche for FLUoxetine (PROZAC) 40 MG capsule

## 2023-02-15 NOTE — Telephone Encounter (Signed)
Spoke to pt, call was dropped the times I spoke with her.  

## 2023-02-15 NOTE — Telephone Encounter (Signed)
Attempted to call pt back busy dial tone.

## 2023-02-15 NOTE — Telephone Encounter (Signed)
Called patient no answer and sent mychart message to contact our office

## 2023-02-15 NOTE — Telephone Encounter (Signed)
Spoke to pt husband asked for me to call back .

## 2023-02-15 NOTE — Telephone Encounter (Signed)
lmtrc

## 2023-02-16 DIAGNOSIS — Z006 Encounter for examination for normal comparison and control in clinical research program: Secondary | ICD-10-CM

## 2023-02-16 MED ORDER — STUDY - LIBREXIA-ACS - MILVEXIAN 25 MG OR PLACEBO TABLET (PI-STUCKEY): 1.0000 | ORAL_TABLET | Freq: Two times a day (BID) | ORAL | 0 refills | Status: DC

## 2023-02-16 NOTE — Telephone Encounter (Signed)
Spoke to pt phone was cut off in the middle of our conversation.

## 2023-02-16 NOTE — Telephone Encounter (Signed)
Unable to fill fluoxetine due to this being provided and followed by primary care- upon chart review this was sent in yesterday by primary care. Thank you!

## 2023-02-16 NOTE — Telephone Encounter (Signed)
Spoke with pt phone call was cut off in the middle of our conversation.

## 2023-02-16 NOTE — Research (Addendum)
 LIBREXIA ACS 4 WEEK VISIT                                                                                                                                                                                                                                                                                         Labs stable TS   Central Labs Hematology/Chemistry drawn: [x] Yes [] No   PK/PD Biomarkers drawn: [x] Yes [] No Subject took morning dose of IP prior to PK draw    EQ-5D-5L Assessment Completed? [x] Yes [] No Reason not done: [] Subject Forgot [] Subject too ill [] Subject refused [] Technical failure [] Other If Other, Specify Collection Date   Medication Kit Accountability Dispensed Status [x] Dispensed [] Dispensed In Error [] Not Dispensed If 'Not Dispensed', provide reason: [] Medical Decision [] Medication Kit not available or damaged [] Study medication discontinued [] Visit skipped or administration skipped [] Medication kit dispensed in error Date Dispensed: 08/May/2024 Amount Dispensed:  [id number] (Milvexian) 25mg  or Placebo -   [phone],  [phone],  [phone]  Return Status: [] Damaged/Returned by subject [] Missing/Not returned by subject [x] Returned by subject If not returned reasons: [] Forgot [] Lost Date Returned: 08/May/2024 Amount Returned: 14 tablets  [id number] (Milvexian) 25mg  or Placebo -   [phone]  Compliance%: 100   Were any suspected endpoint events or adverse events experienced? [] Yes [x] No   Patient declined real world ICF. Re-consented with new ICF.  Patient ID card given to subject.   Are there any labs that are clinically significant?  Yes []  OR No[x]                             Current Outpatient Medications:    aspirin  81 MG tablet, Take by mouth every evening. , Disp: , Rfl:    azelastine  (ASTELIN ) 0.1 % nasal spray, Place 1 spray into both nostrils 2 (two) times daily. Use in each nostril as directed, Disp: 30 mL, Rfl:  1   b complex vitamins capsule, Take 1 capsule by mouth daily., Disp: , Rfl:    cetirizine  (ZYRTEC ) 5 MG tablet, Take 1 tablet (5 mg total) by mouth daily. (Patient taking differently: Take 5 mg by mouth daily  as needed for allergies.), Disp: 30 tablet, Rfl: 0   clopidogrel  (PLAVIX ) 75 MG tablet, Take 1 tablet (75 mg total) by mouth daily., Disp: 90 tablet, Rfl: 3   dapagliflozin  propanediol (FARXIGA ) 10 MG TABS tablet, Take 1 tablet (10 mg total) by mouth daily., Disp: 30 tablet, Rfl: 11   Evolocumab  (REPATHA ) 140 MG/ML SOSY, Inject 140 mg into the skin every 14 (fourteen) days., Disp: 2.1 mL, Rfl: 3   ezetimibe  (ZETIA ) 10 MG tablet, Take 1 tablet (10 mg total) by mouth daily., Disp: 90 tablet, Rfl: 3   FLUoxetine  (PROZAC ) 40 MG capsule, Take 1 capsule (40 mg total) by mouth daily., Disp: 90 capsule, Rfl: 3   furosemide  (LASIX ) 40 MG tablet, Take 1 tablet (40 mg) for 3 lb weight gain overnight., Disp: 30 tablet, Rfl: 11   Glucose Blood (BLOOD GLUCOSE TEST STRIPS) STRP, Use to check glucose twice daily, Disp: 100 strip, Rfl: 11   glucose blood test strip, Frequency:PHARMDIR   Dosage:0.0     Instructions:  Note:testing 3-4xqd, DX Code 250.00 Dose: 1, Disp: , Rfl:    insulin  detemir (LEVEMIR  FLEXPEN) 100 UNIT/ML FlexPen, Inject 16 Units into the skin at bedtime., Disp: 15 mL, Rfl: 3   Insulin  Pen Needle (BD PEN NEEDLE NANO U/F) 32G X 4 MM MISC, Use to check glucose once daily, Disp: 100 each, Rfl: 6   levothyroxine  (SYNTHROID ) 50 MCG tablet, Take 1 tablet (50 mcg total) by mouth daily before breakfast. (Patient taking differently: Take 50 mcg by mouth at bedtime.), Disp: 90 tablet, Rfl: 3   metFORMIN  (GLUCOPHAGE -XR) 500 MG 24 hr tablet, Take 1 tablet (500 mg total) by mouth daily with breakfast. Resume on 01/18/23, Disp: 90 tablet, Rfl: 3   metoprolol  succinate (TOPROL -XL) 25 MG 24 hr tablet, Take 1 tablet (25 mg total) by mouth daily., Disp: 90 tablet, Rfl: 3   nitroGLYCERIN  (NITROSTAT ) 0.4 MG SL  tablet, Place 1 tablet (0.4 mg total) under the tongue every 5 (five) minutes as needed for chest pain., Disp: 25 tablet, Rfl: 3   sacubitril -valsartan  (ENTRESTO ) 24-26 MG, Take 1 tablet by mouth 2 (two) times daily., Disp: 180 tablet, Rfl: 3   sodium chloride  (OCEAN) 0.65 % SOLN nasal spray, Place 1 spray into both nostrils as needed for congestion. (Patient not taking: Reported on 01/25/2023), Disp: 30 mL, Rfl: 0   spironolactone  (ALDACTONE ) 25 MG tablet, Take 0.5 tablets (12.5 mg total) by mouth daily., Disp: 45 tablet, Rfl: 3   Study - LIBREXIA-ACS - milvexian 25 mg or placebo tablet (PI-Stuckey), Take 1 tablet by mouth 2 (two) times daily. Take 1 tablet by mouth twice a day with or without food. Bring bottle back to every visit. Please contact [facility] for any questions or concerns regarding the medication, Disp: 210 tablet, Rfl: 0   triamcinolone cream (KENALOG) 0.1 %, Apply 1 Application topically daily as needed (for tick bites)., Disp: , Rfl:    Vitamin D , Ergocalciferol , (DRISDOL ) 1.25 MG (50000 UNIT) CAPS capsule, Take 1 capsule (50,000 Units total) by mouth every 7 (seven) days., Disp: 10 capsule, Rfl: 1

## 2023-02-25 ENCOUNTER — Other Ambulatory Visit: Payer: Self-pay | Admitting: Family Medicine

## 2023-02-25 ENCOUNTER — Other Ambulatory Visit: Payer: Self-pay | Admitting: Internal Medicine

## 2023-02-25 DIAGNOSIS — I10 Essential (primary) hypertension: Secondary | ICD-10-CM

## 2023-03-09 ENCOUNTER — Encounter (HOSPITAL_COMMUNITY): Payer: Medicare HMO

## 2023-03-10 ENCOUNTER — Ambulatory Visit: Payer: Medicare HMO | Admitting: Family Medicine

## 2023-03-18 DIAGNOSIS — I213 ST elevation (STEMI) myocardial infarction of unspecified site: Secondary | ICD-10-CM | POA: Diagnosis not present

## 2023-03-21 ENCOUNTER — Ambulatory Visit: Payer: Medicare HMO | Admitting: Podiatry

## 2023-03-23 ENCOUNTER — Ambulatory Visit (INDEPENDENT_AMBULATORY_CARE_PROVIDER_SITE_OTHER): Payer: Medicare HMO | Admitting: Internal Medicine

## 2023-03-23 ENCOUNTER — Encounter: Payer: Self-pay | Admitting: Internal Medicine

## 2023-03-23 ENCOUNTER — Telehealth: Payer: Self-pay | Admitting: Nurse Practitioner

## 2023-03-23 VITALS — BP 126/63 | HR 65 | Ht 66.0 in | Wt 162.4 lb

## 2023-03-23 DIAGNOSIS — E118 Type 2 diabetes mellitus with unspecified complications: Secondary | ICD-10-CM

## 2023-03-23 DIAGNOSIS — I10 Essential (primary) hypertension: Secondary | ICD-10-CM

## 2023-03-23 DIAGNOSIS — I5043 Acute on chronic combined systolic (congestive) and diastolic (congestive) heart failure: Secondary | ICD-10-CM | POA: Diagnosis not present

## 2023-03-23 DIAGNOSIS — F324 Major depressive disorder, single episode, in partial remission: Secondary | ICD-10-CM

## 2023-03-23 DIAGNOSIS — N179 Acute kidney failure, unspecified: Secondary | ICD-10-CM | POA: Diagnosis not present

## 2023-03-23 DIAGNOSIS — E559 Vitamin D deficiency, unspecified: Secondary | ICD-10-CM | POA: Diagnosis not present

## 2023-03-23 DIAGNOSIS — I25709 Atherosclerosis of coronary artery bypass graft(s), unspecified, with unspecified angina pectoris: Secondary | ICD-10-CM | POA: Diagnosis not present

## 2023-03-23 DIAGNOSIS — E890 Postprocedural hypothyroidism: Secondary | ICD-10-CM

## 2023-03-23 MED ORDER — TRESIBA FLEXTOUCH 100 UNIT/ML ~~LOC~~ SOPN: 16.0000 [IU] | PEN_INJECTOR | Freq: Every day | SUBCUTANEOUS | 2 refills | Status: DC

## 2023-03-23 NOTE — Telephone Encounter (Signed)
Pt came by and said we " need to override " for her Levemier. She is not sure what they mean. Is this a PA possibly?

## 2023-03-23 NOTE — Patient Instructions (Signed)
It was a pleasure to see you today.  Thank you for giving [place] the opportunity to be involved in your care.  Below is a brief recap of your visit and next steps.  We will plan to see you again in 3 months.  Summary No medication changes today Check chemistry panel Follow up in 3 months

## 2023-03-23 NOTE — Telephone Encounter (Signed)
Lets change to [place] (which is a similar med to Levemir).  Levemir is being discontinued and truly I like Evaristo Bury a bit better anyway.

## 2023-03-23 NOTE — Telephone Encounter (Signed)
Levemir is no longer on the formulary according to [facility]. Lantus, Toujeo, & Evaristo Bury are on Formulary.  Tried to call pt no answer.

## 2023-03-23 NOTE — Telephone Encounter (Signed)
Sent to [facility] with note to pharmacist stating reason for change since pt cannot be reached.

## 2023-03-23 NOTE — Progress Notes (Signed)
Established Patient Office Visit  Subjective   Patient ID: Angela Rose, female    DOB: 10/08/1946  Age: 77 y.o. MRN: [id number]  Chief Complaint  Patient presents with   Depression    Follow up    Angela Rose returns to care today for routine follow-up.  She was last evaluated at [facility] on 2/28.  In the interim she has been seen by podiatry and endocrinology.  Angela Rose was also admitted to [facility] in April for inferior STEMI.  Emergent LHC demonstrated severe native vessel disease with 99% proximal stenosis of SVG-OM 2-distal RCA.  PCI was attempted but unsuccessful.  Medical management recommended.  TTE was also updated during this admission, which revealed EF 30-35%.  She was fitted and discharged with a LifeVest as she was felt to be high risk for lethal arrhythmias as frequent PVCs were noted on telemetry.  Angela Rose reports feeling well today.  She is asymptomatic and has no additional concerns to discuss.  She has not experienced any adverse side effects since medications were recently titrated as result of her hospital admission in April.  Past Medical History:  Diagnosis Date   Anxiety 07/10/2014   Cancer (HCC) 12/13/2011   melanoma face   CHF (congestive heart failure), NYHA class II, acute on chronic, combined (HCC) 01/14/2023   Chronic sinusitis 07/02/2022   Depression 07/10/2014   Diabetes mellitus without complication (HCC) 08/21/2014   Fracture of tibial plateau 12/31/2015   Hyperlipidemia 08/14/2015   Hypertension 09/03/2014   Hypothyroidism, postablative 02/08/2014   Myocardial infarction [facility]) 1993   Tick bites 10/29/2014   Vitamin D deficiency 12/08/2022   Past Surgical History:  Procedure Laterality Date   ABDOMINAL HYSTERECTOMY     APPENDECTOMY     BASAL CELL CARCINOMA EXCISION     BREAST BIOPSY Right    needle bx-neg   CATARACT EXTRACTION, BILATERAL Bilateral 11/23/2018   CORONARY ARTERY BYPASS GRAFT  1996   x 3    CORONARY/GRAFT ACUTE MI REVASCULARIZATION N/A 01/13/2023   Procedure: Coronary/Graft Acute MI Revascularization;  Surgeon: Marykay Lex, MD;  Location: [facility] INVASIVE CV LAB;  Service: Cardiovascular;  Laterality: N/A;   LEFT HEART CATH AND CORONARY ANGIOGRAPHY N/A 01/13/2023   Procedure: LEFT HEART CATH AND CORONARY ANGIOGRAPHY;  Surgeon: Marykay Lex, MD;  Location: [facility] INVASIVE CV LAB;  Service: Cardiovascular;  Laterality: N/A;   MELANOMA EXCISION     PARTIAL HYSTERECTOMY  1976   ovaries remain   Social History   Tobacco Use   Smoking status: Former    Types: Cigarettes    Quit date: 06/11/1982    Years since quitting: 40.8   Smokeless tobacco: Never   Tobacco comments:    smoking cessation materials not required  Vaping Use   Vaping Use: Never used  Substance Use Topics   Alcohol use: Yes    Alcohol/week: 0.0 standard drinks of alcohol    Comment: rare 1 wine glass of wine   Drug use: No   Family History  Problem Relation Age of Onset   Skin cancer Mother    Squamous cell carcinoma Mother    Basal cell carcinoma Mother    Heart disease Father    CAD Father    Breast cancer Neg Hx    Allergies  Allergen Reactions   Propoxyphene Nausea And Vomiting    Other reaction(s): Vomiting   Lidocaine-Epinephrine     Other reaction(s): Other (See Comments) palpitatins and chest pain  Other Other (See Comments)    sutures - in her hand-very irritated   Tape Rash    BANDAIDS. BANDAIDS   Xylocaine  [Lidocaine Hcl]     Other reaction(s): Other (See Comments) palpitatins and chest pain   Statins     Muscle pain and weakness in legs   Jardiance [Empagliflozin] Other (See Comments)    Recurrent vaginal infections      Review of Systems  Constitutional:  Negative for chills and fever.  HENT:  Negative for sore throat.   Respiratory:  Negative for cough and shortness of breath.   Cardiovascular:  Negative for chest pain, palpitations and leg swelling.  Gastrointestinal:   Negative for abdominal pain, blood in stool, constipation, diarrhea, nausea and vomiting.  Genitourinary:  Negative for dysuria and hematuria.  Musculoskeletal:  Negative for myalgias.  Skin:  Negative for itching and rash.  Neurological:  Negative for dizziness and headaches.  Psychiatric/Behavioral:  Negative for depression and suicidal ideas.       Objective:     BP 126/63   Pulse 65   Ht 5\' 6"  (1.676 m)   Wt 162 lb 6.4 oz (73.7 kg)   SpO2 96%   BMI 26.21 kg/m  BP Readings from Last 3 Encounters:  03/23/23 126/63  01/25/23 130/67  01/19/23 (!) 122/49   Physical Exam Vitals reviewed.  Constitutional:      General: She is not in acute distress.    Appearance: Normal appearance. She is not toxic-appearing.  HENT:     Head: Normocephalic and atraumatic.     Right Ear: External ear normal.     Left Ear: External ear normal.     Nose: Nose normal. No congestion or rhinorrhea.     Mouth/Throat:     Mouth: Mucous membranes are moist.     Pharynx: Oropharynx is clear. No oropharyngeal exudate or posterior oropharyngeal erythema.  Eyes:     General: No scleral icterus.    Extraocular Movements: Extraocular movements intact.     Conjunctiva/sclera: Conjunctivae normal.     Pupils: Pupils are equal, round, and reactive to light.  Cardiovascular:     Rate and Rhythm: Normal rate and regular rhythm.     Pulses: Normal pulses.     Heart sounds: Normal heart sounds. No murmur heard.    No friction rub. No gallop.  Pulmonary:     Effort: Pulmonary effort is normal.     Breath sounds: Normal breath sounds. No wheezing, rhonchi or rales.  Abdominal:     General: Abdomen is flat. Bowel sounds are normal. There is no distension.     Palpations: Abdomen is soft.     Tenderness: There is no abdominal tenderness.  Musculoskeletal:        General: No swelling. Normal range of motion.     Cervical back: Normal range of motion.     Right lower leg: No edema.     Left lower leg: No  edema.  Lymphadenopathy:     Cervical: No cervical adenopathy.  Skin:    General: Skin is warm and dry.     Capillary Refill: Capillary refill takes less than 2 seconds.     Coloration: Skin is not jaundiced.  Neurological:     General: No focal deficit present.     Mental Status: She is alert and oriented to person, place, and time.  Psychiatric:        Mood and Affect: Mood normal.  Behavior: Behavior normal.   Last CBC Lab Results  Component Value Date   WBC 7.9 01/15/2023   HGB 10.8 (L) 01/15/2023   HCT 33.0 (L) 01/15/2023   MCV 88.7 01/15/2023   MCH 29.0 01/15/2023   RDW 13.6 01/15/2023   PLT 242 01/15/2023   Last metabolic panel Lab Results  Component Value Date   GLUCOSE 98 03/23/2023   NA 139 03/23/2023   K 5.1 03/23/2023   CL 101 03/23/2023   CO2 22 03/23/2023   BUN 31 (H) 03/23/2023   CREATININE 1.44 (H) 03/23/2023   GFRNONAA 40 (L) 01/16/2023   CALCIUM 9.4 03/23/2023   PROT 6.8 01/13/2023   ALBUMIN 3.5 01/13/2023   LABGLOB 2.7 12/27/2022   AGRATIO 1.6 12/27/2022   BILITOT 0.4 01/13/2023   ALKPHOS 95 01/13/2023   AST 46 (H) 01/13/2023   ALT 19 01/13/2023   ANIONGAP 6 01/16/2023   Last lipids Lab Results  Component Value Date   CHOL 242 (H) 01/12/2023   HDL 57 01/12/2023   LDLCALC 133 (H) 01/12/2023   TRIG 261 (H) 01/12/2023   CHOLHDL 4.2 01/12/2023   Last hemoglobin A1c Lab Results  Component Value Date   HGBA1C 9.4 (H) 01/12/2023   Last thyroid functions Lab Results  Component Value Date   TSH 2.160 12/27/2022   T4TOTAL 8.2 02/16/2016   Last vitamin D Lab Results  Component Value Date   VD25OH 21.6 (L) 12/06/2022     Assessment & Plan:   Problem List Items Addressed This Visit       Essential (primary) hypertension (Chronic)    BP is well-controlled on current antihypertensive regimen.      Coronary artery disease involving coronary bypass graft of native heart with angina pectoris [facility])    Extensive history of CAD  s/p CABG x 3 (1996) with recent admission for inferior STEMI.  A proximal lesion with 99% stenosis was noted at SVG-OM2-distal RCA.  PCI unsuccessful.  Medical management recommended.  She has not experienced any recent chest pain.  Currently on DAPT (ASA/Plavix), metoprolol succinate 25 mg daily, and Repatha/Zetia. -Cardiology follow-up scheduled for July      CHF (congestive heart failure), NYHA class II, acute on chronic, combined (HCC)    TTE updated during recent admission.  EF 30-35%.  Euvolemic on exam today.  She was fitted and discharged with a LifeVest.  Currently prescribed Entresto, spironolactone, and Lasix 40 mg as needed.  Repeat TTE is scheduled for July.      Type II diabetes mellitus with complication (HCC) (Chronic)    Followed by endocrinology.  A1c 9.4 in April.  She is currently prescribed Farxiga, metformin XR, and Levemir.  She will be transitioning to [place] due to insurance coverage. -Endocrinology follow-up scheduled for July.      Hypothyroidism, postablative    Followed by endocrinology.  Currently prescribed levothyroxine 50 mcg daily.  TFTs WNL when updated in March.      Major depression in partial remission (HCC)    Mood remains stable with fluoxetine 40 mg daily.      Vitamin D insufficiency    Noted on labs from February.  She is currently on daily vitamin D supplementation.       Return in about 3 months (around 06/23/2023).    Billie Lade, MD

## 2023-03-24 ENCOUNTER — Other Ambulatory Visit: Payer: Self-pay

## 2023-03-24 DIAGNOSIS — E118 Type 2 diabetes mellitus with unspecified complications: Secondary | ICD-10-CM

## 2023-03-24 LAB — BASIC METABOLIC PANEL
BUN/Creatinine Ratio: 22 (ref 12–28)
BUN: 31 mg/dL — ABNORMAL HIGH (ref 8–27)
CO2: 22 mmol/L (ref 20–29)
Calcium: 9.4 mg/dL (ref 8.7–10.3)
Chloride: 101 mmol/L (ref 96–106)
Creatinine, Ser: 1.44 mg/dL — ABNORMAL HIGH (ref 0.57–1.00)
Glucose: 98 mg/dL (ref 70–99)
Potassium: 5.1 mmol/L (ref 3.5–5.2)
Sodium: 139 mmol/L (ref 134–144)
eGFR: 37 mL/min/{1.73_m2} — ABNORMAL LOW (ref >59)

## 2023-03-28 ENCOUNTER — Ambulatory Visit: Payer: Medicare HMO | Admitting: Podiatry

## 2023-03-29 ENCOUNTER — Ambulatory Visit (INDEPENDENT_AMBULATORY_CARE_PROVIDER_SITE_OTHER): Payer: Medicare HMO | Admitting: Podiatry

## 2023-03-29 ENCOUNTER — Encounter: Payer: Self-pay | Admitting: Podiatry

## 2023-03-29 DIAGNOSIS — E118 Type 2 diabetes mellitus with unspecified complications: Secondary | ICD-10-CM

## 2023-03-29 DIAGNOSIS — M79674 Pain in right toe(s): Secondary | ICD-10-CM

## 2023-03-29 DIAGNOSIS — M79675 Pain in left toe(s): Secondary | ICD-10-CM

## 2023-03-29 DIAGNOSIS — B351 Tinea unguium: Secondary | ICD-10-CM

## 2023-03-29 NOTE — Telephone Encounter (Signed)
I have called [facility] pharmacy and spoke with the pharmacist, Mohammed. I verified all of his questions.

## 2023-03-29 NOTE — Telephone Encounter (Signed)
[facility] in [place] has called requesting verification of prescription, please call back [phone].

## 2023-03-29 NOTE — Progress Notes (Signed)

## 2023-03-30 ENCOUNTER — Encounter: Payer: Self-pay | Admitting: Internal Medicine

## 2023-03-30 DIAGNOSIS — E559 Vitamin D deficiency, unspecified: Secondary | ICD-10-CM | POA: Insufficient documentation

## 2023-03-30 NOTE — Assessment & Plan Note (Signed)
Noted on labs from February.  She is currently on daily vitamin D supplementation.

## 2023-03-30 NOTE — Assessment & Plan Note (Signed)
Extensive history of CAD s/p CABG x 3 (1996) with recent admission for inferior STEMI.  A proximal lesion with 99% stenosis was noted at SVG-OM2-distal RCA.  PCI unsuccessful.  Medical management recommended.  She has not experienced any recent chest pain.  Currently on DAPT (ASA/Plavix), metoprolol succinate 25 mg daily, and Repatha/Zetia. -Cardiology follow-up scheduled for July

## 2023-03-30 NOTE — Assessment & Plan Note (Signed)
Followed by endocrinology.  Currently prescribed levothyroxine 50 mcg daily.  TFTs WNL when updated in March.

## 2023-03-30 NOTE — Assessment & Plan Note (Signed)
Followed by endocrinology.  A1c 9.4 in April.  She is currently prescribed Farxiga, metformin XR, and Levemir.  She will be transitioning to [place] due to insurance coverage. -Endocrinology follow-up scheduled for July.

## 2023-03-30 NOTE — Assessment & Plan Note (Signed)
Mood remains stable with fluoxetine 40 mg daily.

## 2023-03-30 NOTE — Assessment & Plan Note (Signed)
BP is well-controlled on current antihypertensive regimen.

## 2023-03-30 NOTE — Assessment & Plan Note (Signed)
TTE updated during recent admission.  EF 30-35%.  Euvolemic on exam today.  She was fitted and discharged with a LifeVest.  Currently prescribed Entresto, spironolactone, and Lasix 40 mg as needed.  Repeat TTE is scheduled for July.

## 2023-04-19 DIAGNOSIS — Z006 Encounter for examination for normal comparison and control in clinical research program: Secondary | ICD-10-CM

## 2023-04-19 MED ORDER — STUDY - LIBREXIA-ACS - MILVEXIAN 25 MG OR PLACEBO TABLET (PI-STUCKEY): 1.0000 | ORAL_TABLET | Freq: Two times a day (BID) | ORAL | 0 refills | Status: DC

## 2023-04-19 NOTE — Research (Cosign Needed Addendum)
 [facility] ACS 13 WEEK VISIT    [facility] Hematology/Chemistry drawn: [x] Yes [] No   Medication Kit Accountability Dispensed Status [x] Dispensed [] Dispensed In Error [] Not Dispensed If 'Not Dispensed', provide reason: [] Medical Decision [] Medication Kit not available or damaged [] Study medication discontinued [] Visit skipped or administration skipped [] Medication kit dispensed in error Date Dispensed: 09/Jul/2024 Amount Dispensed: [id number] (Milvexian) 25mg  or Placebo -   [phone],  [phone],  [phone],  [phone]  Return Status: [] Damaged/Returned by subject [] Missing/Not returned by subject [x] Returned by subject If not returned reasons: [] Forgot [] Lost Date Returned: 09/Jul/2024 Amount Returned:  [phone] 70 tablets  [phone] 29 tablets  [phone] 0 tablets    Compliance%: 90    Were any suspected endpoint events or adverse events experienced? [] Yes [x] No   Are there any labs that are clinically significant?  Yes []  OR No[x]  Labs reviewed.  Continued clinical follow up.                           Current Outpatient Medications:    aspirin  81 MG tablet, Take by mouth every evening. , Disp: , Rfl:    b complex vitamins capsule, Take 1 capsule by mouth daily., Disp: , Rfl:    clopidogrel  (PLAVIX ) 75 MG tablet, Take 1 tablet (75 mg total) by mouth daily., Disp: 90 tablet, Rfl: 3   dapagliflozin  propanediol (FARXIGA ) 10 MG TABS tablet, Take 1 tablet (10 mg total) by mouth daily., Disp: 30 tablet, Rfl: 11   Evolocumab  (REPATHA ) 140 MG/ML SOSY, Inject 140 mg into the skin every 14 (fourteen) days., Disp: 2.1 mL, Rfl: 3   FLUoxetine  (PROZAC ) 40 MG capsule, Take 1 capsule (40 mg total) by mouth daily., Disp: 90 capsule, Rfl: 3   furosemide  (LASIX ) 40 MG tablet, Take 1 tablet (40 mg) for 3 lb weight gain overnight., Disp: 30 tablet, Rfl: 11   Glucose Blood (BLOOD GLUCOSE TEST STRIPS) STRP, Use to check glucose twice daily, Disp: 100 strip, Rfl: 11    glucose blood test strip, Frequency:PHARMDIR   Dosage:0.0     Instructions:  Note:testing 3-4xqd, DX Code 250.00 Dose: 1, Disp: , Rfl:    Insulin  Pen Needle (BD PEN NEEDLE NANO U/F) 32G X 4 MM MISC, Use to check glucose once daily, Disp: 100 each, Rfl: 6   levothyroxine  (SYNTHROID ) 50 MCG tablet, Take 1 tablet (50 mcg total) by mouth daily before breakfast. (Patient taking differently: Take 50 mcg by mouth at bedtime.), Disp: 90 tablet, Rfl: 3   metFORMIN  (GLUCOPHAGE -XR) 500 MG 24 hr tablet, Take 1 tablet (500 mg total) by mouth daily with breakfast. Resume on 01/18/23, Disp: 90 tablet, Rfl: 3   metoprolol  succinate (TOPROL -XL) 25 MG 24 hr tablet, Take 1 tablet (25 mg total) by mouth daily., Disp: 90 tablet, Rfl: 3   nitroGLYCERIN  (NITROSTAT ) 0.4 MG SL tablet, Place 1 tablet (0.4 mg total) under the tongue every 5 (five) minutes as needed for chest pain., Disp: 25 tablet, Rfl: 3   sacubitril -valsartan  (ENTRESTO ) 24-26 MG, Take 1 tablet by mouth 2 (two) times daily., Disp: 180 tablet, Rfl: 3   spironolactone  (ALDACTONE ) 25 MG tablet, Take 0.5 tablets (12.5 mg total) by mouth daily., Disp: 45 tablet, Rfl: 3   Vitamin D , Ergocalciferol , (DRISDOL ) 1.25 MG (50000 UNIT) CAPS capsule, Take 1 capsule (50,000 Units total) by mouth every 7 (seven) days., Disp: 10 capsule, Rfl: 1   azelastine  (ASTELIN ) 0.1 % nasal spray, Place 1 spray into both nostrils 2 (two) times daily.  Use in each nostril as directed (Patient not taking: Reported on 04/19/2023), Disp: 30 mL, Rfl: 1   cetirizine  (ZYRTEC ) 5 MG tablet, Take 1 tablet (5 mg total) by mouth daily. (Patient not taking: Reported on 04/19/2023), Disp: 30 tablet, Rfl: 0   ezetimibe  (ZETIA ) 10 MG tablet, Take 1 tablet (10 mg total) by mouth daily. (Patient not taking: Reported on 04/19/2023), Disp: 90 tablet, Rfl: 3   insulin  degludec (TRESIBA  FLEXTOUCH) 100 UNIT/ML FlexTouch Pen, Inject 16 Units into the skin at bedtime. (Patient not taking: Reported on 04/19/2023), Disp: 4.5 mL,  Rfl: 2   sodium chloride  (OCEAN) 0.65 % SOLN nasal spray, Place 1 spray into both nostrils as needed for congestion. (Patient not taking: Reported on 04/19/2023), Disp: 30 mL, Rfl: 0   Study - LIBREXIA-ACS - milvexian 25 mg or placebo tablet (PI-Stuckey), Take 1 tablet by mouth 2 (two) times daily. Take 1 tablet by mouth twice a day with or without food. Bring bottle back to every visit. Please contact [facility] for any questions or concerns regarding the medication, Disp: 280 tablet, Rfl: 0   triamcinolone cream (KENALOG) 0.1 %, Apply 1 Application topically daily as needed (for tick bites). (Patient not taking: Reported on 04/19/2023), Disp: , Rfl:

## 2023-04-20 ENCOUNTER — Ambulatory Visit (HOSPITAL_COMMUNITY)
Admission: RE | Admit: 2023-04-20 | Discharge: 2023-04-20 | Disposition: A | Discharge status: Home / Self Care | Payer: Medicare HMO | Source: Ambulatory Visit | Attending: Internal Medicine | Admitting: Internal Medicine

## 2023-04-20 DIAGNOSIS — I5021 Acute systolic (congestive) heart failure: Secondary | ICD-10-CM | POA: Diagnosis not present

## 2023-04-20 LAB — ECHOCARDIOGRAM COMPLETE
Area-P 1/2: 3.65 cm2
Calc EF: 38.6 %
MV M vel: 4.16 m/s
MV Peak grad: 69.2 mmHg
S' Lateral: 4.1 cm
Single Plane A2C EF: 42.2 %
Single Plane A4C EF: 40.5 %

## 2023-04-20 NOTE — Progress Notes (Signed)
*  PRELIMINARY RESULTS* Echocardiogram 2D Echocardiogram has been performed.  Stacey Drain 04/20/2023, 2:00 PM

## 2023-04-26 ENCOUNTER — Encounter (HOSPITAL_COMMUNITY): Payer: Medicare HMO

## 2023-04-28 ENCOUNTER — Encounter (HOSPITAL_COMMUNITY): Payer: Self-pay

## 2023-04-29 VITALS — BP 122/68 | HR 60 | Resp 15

## 2023-04-29 DIAGNOSIS — Z006 Encounter for examination for normal comparison and control in clinical research program: Secondary | ICD-10-CM

## 2023-04-29 NOTE — Research (Addendum)
[facility] ACS Unscheduled Visit   Unscheduled visit for lab re draw. Subject re-consented with new ICF.  Are there any labs that are clinically significant?  Yes []  OR No[x]                             Current Outpatient Medications:    aspirin 81 MG tablet, Take by mouth every evening. , Disp: , Rfl:    azelastine (ASTELIN) 0.1 % nasal spray, Place 1 spray into both nostrils 2 (two) times daily. Use in each nostril as directed (Patient not taking: Reported on 04/19/2023), Disp: 30 mL, Rfl: 1   b complex vitamins capsule, Take 1 capsule by mouth daily., Disp: , Rfl:    cetirizine (ZYRTEC) 5 MG tablet, Take 1 tablet (5 mg total) by mouth daily. (Patient not taking: Reported on 04/19/2023), Disp: 30 tablet, Rfl: 0   clopidogrel (PLAVIX) 75 MG tablet, Take 1 tablet (75 mg total) by mouth daily., Disp: 90 tablet, Rfl: 3   dapagliflozin propanediol (FARXIGA) 10 MG TABS tablet, Take 1 tablet (10 mg total) by mouth daily., Disp: 30 tablet, Rfl: 11   Evolocumab (REPATHA) 140 MG/ML SOSY, Inject 140 mg into the skin every 14 (fourteen) days., Disp: 2.1 mL, Rfl: 3   ezetimibe (ZETIA) 10 MG tablet, Take 1 tablet (10 mg total) by mouth daily. (Patient not taking: Reported on 04/19/2023), Disp: 90 tablet, Rfl: 3   FLUoxetine (PROZAC) 40 MG capsule, Take 1 capsule (40 mg total) by mouth daily., Disp: 90 capsule, Rfl: 3   furosemide (LASIX) 40 MG tablet, Take 1 tablet (40 mg) for 3 lb weight gain overnight., Disp: 30 tablet, Rfl: 11   Glucose Blood (BLOOD GLUCOSE TEST STRIPS) STRP, Use to check glucose twice daily, Disp: 100 strip, Rfl: 11   glucose blood test strip, Frequency:PHARMDIR   Dosage:0.0     Instructions:  Note:testing 3-4xqd, DX Code 250.00 Dose: 1, Disp: , Rfl:    insulin degludec (TRESIBA FLEXTOUCH) 100 UNIT/ML FlexTouch Pen, Inject 16 Units into the skin at bedtime. (Patient not taking: Reported on 04/19/2023), Disp: 4.5 mL, Rfl: 2   Insulin Pen Needle (BD PEN NEEDLE NANO U/F) 32G  X 4 MM MISC, Use to check glucose once daily, Disp: 100 each, Rfl: 6   levothyroxine (SYNTHROID) 50 MCG tablet, Take 1 tablet (50 mcg total) by mouth daily before breakfast. (Patient taking differently: Take 50 mcg by mouth at bedtime.), Disp: 90 tablet, Rfl: 3   metFORMIN (GLUCOPHAGE-XR) 500 MG 24 hr tablet, Take 1 tablet (500 mg total) by mouth daily with breakfast. Resume on 01/18/23, Disp: 90 tablet, Rfl: 3   metoprolol succinate (TOPROL-XL) 25 MG 24 hr tablet, Take 1 tablet (25 mg total) by mouth daily., Disp: 90 tablet, Rfl: 3   nitroGLYCERIN (NITROSTAT) 0.4 MG SL tablet, Place 1 tablet (0.4 mg total) under the tongue every 5 (five) minutes as needed for chest pain., Disp: 25 tablet, Rfl: 3   sacubitril-valsartan (ENTRESTO) 24-26 MG, Take 1 tablet by mouth 2 (two) times daily., Disp: 180 tablet, Rfl: 3   sodium chloride (OCEAN) 0.65 % SOLN nasal spray, Place 1 spray into both nostrils as needed for congestion. (Patient not taking: Reported on 04/19/2023), Disp: 30 mL, Rfl: 0   spironolactone (ALDACTONE) 25 MG tablet, Take 0.5 tablets (12.5 mg total) by mouth daily., Disp: 45 tablet, Rfl: 3   Study - [facility]-ACS - milvexian 25 mg or placebo tablet (PI-Stuckey), Take 1 tablet  by mouth 2 (two) times daily. Take 1 tablet by mouth twice a day with or without food. Bring bottle back to every visit. Please contact [facility] for any questions or concerns regarding the medication, Disp: 280 tablet, Rfl: 0   triamcinolone cream (KENALOG) 0.1 %, Apply 1 Application topically daily as needed (for tick bites). (Patient not taking: Reported on 04/19/2023), Disp: , Rfl:    Vitamin D, Ergocalciferol, (DRISDOL) 1.25 MG (50000 UNIT) CAPS capsule, Take 1 capsule (50,000 Units total) by mouth every 7 (seven) days., Disp: 10 capsule, Rfl: 1

## 2023-05-02 ENCOUNTER — Ambulatory Visit (INDEPENDENT_AMBULATORY_CARE_PROVIDER_SITE_OTHER): Payer: Medicare HMO | Admitting: Nurse Practitioner

## 2023-05-02 ENCOUNTER — Encounter: Payer: Self-pay | Admitting: Nurse Practitioner

## 2023-05-02 VITALS — BP 102/64 | HR 63 | Ht 66.0 in | Wt 160.2 lb

## 2023-05-02 DIAGNOSIS — Z7984 Long term (current) use of oral hypoglycemic drugs: Secondary | ICD-10-CM

## 2023-05-02 DIAGNOSIS — E1122 Type 2 diabetes mellitus with diabetic chronic kidney disease: Secondary | ICD-10-CM | POA: Diagnosis not present

## 2023-05-02 DIAGNOSIS — N1832 Chronic kidney disease, stage 3b: Secondary | ICD-10-CM

## 2023-05-02 DIAGNOSIS — Z794 Long term (current) use of insulin: Secondary | ICD-10-CM | POA: Diagnosis not present

## 2023-05-02 DIAGNOSIS — E890 Postprocedural hypothyroidism: Secondary | ICD-10-CM

## 2023-05-02 DIAGNOSIS — I10 Essential (primary) hypertension: Secondary | ICD-10-CM | POA: Diagnosis not present

## 2023-05-02 LAB — POCT GLYCOSYLATED HEMOGLOBIN (HGB A1C): Hemoglobin A1C: 7.8 % — AB (ref 4.0–5.6)

## 2023-05-02 NOTE — Progress Notes (Signed)
Endocrinology Follow Up Note       05/02/2023, 2:16 PM   Subjective:    Patient ID: Angela Rose, female    DOB: Aug 10, 1946.  Angela Rose is being seen in follow up after being seen in consultation for management of currently uncontrolled symptomatic diabetes requested by  Billie Lade, MD.   Past Medical History:  Diagnosis Date   Anxiety 07/10/2014   Cancer (HCC) 12/13/2011   melanoma face   CHF (congestive heart failure), NYHA class II, acute on chronic, combined (HCC) 01/14/2023   Chronic kidney disease (CKD), stage III (moderate) (HCC) 02/17/2022   Chronic sinusitis 07/02/2022   Depression 07/10/2014   Diabetes mellitus without complication (HCC) 08/21/2014   Fracture of tibial plateau 12/31/2015   Hyperlipidemia 08/14/2015   Hypertension 09/03/2014   Hypothyroidism, postablative 02/08/2014   Myocardial infarction (HCC) 1993   Tick bites 10/29/2014   Vitamin D deficiency 12/08/2022    Past Surgical History:  Procedure Laterality Date   ABDOMINAL HYSTERECTOMY     APPENDECTOMY     BASAL CELL CARCINOMA EXCISION     BREAST BIOPSY Right    needle bx-neg   CATARACT EXTRACTION, BILATERAL Bilateral 11/23/2018   CORONARY ARTERY BYPASS GRAFT  1996   x 3   CORONARY/GRAFT ACUTE MI REVASCULARIZATION N/A 01/13/2023   Procedure: Coronary/Graft Acute MI Revascularization;  Surgeon: Marykay Lex, MD;  Location: [facility] INVASIVE CV LAB;  Service: Cardiovascular;  Laterality: N/A;   LEFT HEART CATH AND CORONARY ANGIOGRAPHY N/A 01/13/2023   Procedure: LEFT HEART CATH AND CORONARY ANGIOGRAPHY;  Surgeon: Marykay Lex, MD;  Location: [facility] INVASIVE CV LAB;  Service: Cardiovascular;  Laterality: N/A;   MELANOMA EXCISION     PARTIAL HYSTERECTOMY  1976   ovaries remain    Social History   Socioeconomic History   Marital status: Significant Other    Spouse name: Not on file   Number of children: 3    Years of education: Not on file   Highest education level: Some college, no degree  Occupational History   Occupation: retired  Tobacco Use   Smoking status: Former    Current packs/day: 0.00    Types: Cigarettes    Quit date: 06/11/1982    Years since quitting: 40.9   Smokeless tobacco: Never   Tobacco comments:    smoking cessation materials not required  Vaping Use   Vaping status: Never Used  Substance and Sexual Activity   Alcohol use: Yes    Alcohol/week: 0.0 standard drinks of alcohol    Comment: rare 1 wine glass of wine   Drug use: No   Sexual activity: Not on file  Other Topics Concern   Not on file  Social History Narrative   Not on file   Social Determinants of Health   Financial Resource Strain: Low Risk  (01/14/2023)   Overall Financial Resource Strain (CARDIA)    Difficulty of Paying Living Expenses: Not very hard  Food Insecurity: No Food Insecurity (01/13/2023)   Hunger Vital Sign    Worried About Running Out of Food in the Last Year: Never true    Ran Out of Food in the Last Year: Never  true  Transportation Needs: No Transportation Needs (01/13/2023)   PRAPARE - Administrator, Civil Service (Medical): No    Lack of Transportation (Non-Medical): No  Physical Activity: Inactive (12/14/2021)   Exercise Vital Sign    Days of Exercise per Week: 0 days    Minutes of Exercise per Session: 0 min  Stress: Stress Concern Present (12/14/2021)   [employer] of Occupational Health - Occupational Stress Questionnaire    Feeling of Stress : To some extent  Social Connections: Moderately Isolated (12/14/2021)   Social Connection and Isolation Panel [NHANES]    Frequency of Communication with Friends and Family: More than three times a week    Frequency of Social Gatherings with Friends and Family: Once a week    Attends Religious Services: Never    Database administrator or Organizations: No    Attends Engineer, structural: Never    Marital  Status: Living with partner    Family History  Problem Relation Age of Onset   Skin cancer Mother    Squamous cell carcinoma Mother    Basal cell carcinoma Mother    Heart disease Father    CAD Father    Breast cancer Neg Hx     Outpatient Encounter Medications as of 05/02/2023  Medication Sig   aspirin 81 MG tablet Take by mouth every evening.    azelastine (ASTELIN) 0.1 % nasal spray Place 1 spray into both nostrils 2 (two) times daily. Use in each nostril as directed   b complex vitamins capsule Take 1 capsule by mouth daily.   clopidogrel (PLAVIX) 75 MG tablet Take 1 tablet (75 mg total) by mouth daily.   dapagliflozin propanediol (FARXIGA) 10 MG TABS tablet Take 1 tablet (10 mg total) by mouth daily.   Evolocumab (REPATHA) 140 MG/ML SOSY Inject 140 mg into the skin every 14 (fourteen) days.   FLUoxetine (PROZAC) 40 MG capsule Take 1 capsule (40 mg total) by mouth daily.   Glucose Blood (BLOOD GLUCOSE TEST STRIPS) STRP Use to check glucose twice daily   glucose blood test strip Frequency:PHARMDIR   Dosage:0.0     Instructions:  Note:testing 3-4xqd, DX Code 250.00 Dose: 1   insulin degludec (TRESIBA FLEXTOUCH) 100 UNIT/ML FlexTouch Pen Inject 16 Units into the skin at bedtime.   Insulin Pen Needle (BD PEN NEEDLE NANO U/F) 32G X 4 MM MISC Use to check glucose once daily   levothyroxine (SYNTHROID) 50 MCG tablet Take 1 tablet (50 mcg total) by mouth daily before breakfast. (Patient taking differently: Take 50 mcg by mouth at bedtime.)   metFORMIN (GLUCOPHAGE-XR) 500 MG 24 hr tablet Take 1 tablet (500 mg total) by mouth daily with breakfast. Resume on 01/18/23   metoprolol succinate (TOPROL-XL) 25 MG 24 hr tablet Take 1 tablet (25 mg total) by mouth daily.   nitroGLYCERIN (NITROSTAT) 0.4 MG SL tablet Place 1 tablet (0.4 mg total) under the tongue every 5 (five) minutes as needed for chest pain.   sacubitril-valsartan (ENTRESTO) 24-26 MG Take 1 tablet by mouth 2 (two) times daily.   sodium  chloride (OCEAN) 0.65 % SOLN nasal spray Place 1 spray into both nostrils as needed for congestion.   spironolactone (ALDACTONE) 25 MG tablet Take 0.5 tablets (12.5 mg total) by mouth daily.   Study - LIBREXIA-ACS - milvexian 25 mg or placebo tablet (PI-Stuckey) Take 1 tablet by mouth 2 (two) times daily. Take 1 tablet by mouth twice a day with or without food.  Bring bottle back to every visit. Please contact [facility] for any questions or concerns regarding the medication   Vitamin D, Ergocalciferol, (DRISDOL) 1.25 MG (50000 UNIT) CAPS capsule Take 1 capsule (50,000 Units total) by mouth every 7 (seven) days.   cetirizine (ZYRTEC) 5 MG tablet Take 1 tablet (5 mg total) by mouth daily. (Patient not taking: Reported on 04/19/2023)   ezetimibe (ZETIA) 10 MG tablet Take 1 tablet (10 mg total) by mouth daily. (Patient not taking: Reported on 04/19/2023)   furosemide (LASIX) 40 MG tablet Take 1 tablet (40 mg) for 3 lb weight gain overnight. (Patient not taking: Reported on 05/02/2023)   [DISCONTINUED] triamcinolone cream (KENALOG) 0.1 % Apply 1 Application topically daily as needed (for tick bites). (Patient not taking: Reported on 04/19/2023)   No facility-administered encounter medications on file as of 05/02/2023.    ALLERGIES: Allergies  Allergen Reactions   Propoxyphene Nausea And Vomiting    Other reaction(s): Vomiting   Lidocaine-Epinephrine     Other reaction(s): Other (See Comments) palpitatins and chest pain   Other Other (See Comments)    sutures - in her hand-very irritated   Tape Rash    BANDAIDS. BANDAIDS   Xylocaine  [Lidocaine Hcl]     Other reaction(s): Other (See Comments) palpitatins and chest pain   Statins     Muscle pain and weakness in legs   Jardiance [Empagliflozin] Other (See Comments)    Recurrent vaginal infections    VACCINATION STATUS: Immunization History  Administered Date(s) Administered   Fluad Quad(high Dose 65+) 07/25/2019, 08/20/2020, 09/30/2021,  09/07/2022   Influenza, High Dose Seasonal PF 06/20/2017, 06/23/2018   Influenza,inj,Quad PF,6+ Mos 08/14/2015, 09/27/2016   PFIZER(Purple Top)SARS-COV-2 Vaccination 12/01/2019, 12/23/2019, 07/20/2020, 04/24/2021   Pneumococcal Conjugate-13 02/16/2016   Pneumococcal Polysaccharide-23 11/15/2008, 10/14/2014    Diabetes She presents for her follow-up diabetic visit. She has type 2 diabetes mellitus. Onset time: Diagnosed at approx age of 50. Her disease course has been improving. There are no hypoglycemic associated symptoms. Pertinent negatives for diabetes include no fatigue, no polyuria and no weight loss. There are no hypoglycemic complications. Symptoms are stable. Diabetic complications include heart disease and nephropathy. Risk factors for coronary artery disease include diabetes mellitus, dyslipidemia, family history, post-menopausal and sedentary lifestyle. Current diabetic treatment includes insulin injections and oral agent (dual therapy). She is compliant with treatment most of the time. Her weight is stable. She is following a generally healthy diet. When asked about meal planning, she reported none. She has not had a previous visit with a dietitian. She rarely participates in exercise. Her home blood glucose trend is decreasing steadily. Her overall blood glucose range is 90-110 mg/dl. (She presents today with her meter showing at goal glycemic profile overall.  Her POCT A1c today is 7.8%, improving from last visit of 9.4%.  Analysis of her meter shows 7-day average of 95, 14-day average of 107, 30-day average of 113.  She did have 1 low glucose of 56, but says she did not eat her meal that time.) An ACE inhibitor/angiotensin II receptor blocker is being taken. She sees a podiatrist.Eye exam is current.    Review of systems  Constitutional: + Minimally fluctuating body weight,  current Body mass index is 25.86 kg/m. , no fatigue, no subjective hyperthermia, no subjective  hypothermia Eyes: no blurry vision, no xerophthalmia ENT: no sore throat, no nodules palpated in throat, no dysphagia/odynophagia, no hoarseness Cardiovascular: no chest pain, no shortness of breath, no palpitations, no leg swelling  Respiratory: no cough, no shortness of breath Gastrointestinal: no nausea/vomiting/diarrhea Musculoskeletal: no muscle/joint aches Skin: no rashes, no hyperemia Neurological: no tremors, no numbness, no tingling, no dizziness Psychiatric: no depression, no anxiety  Objective:     BP 102/64 (BP Location: Right Arm, Patient Position: Sitting, Cuff Size: Normal)   Pulse 63   Ht 5\' 6"  (1.676 m)   Wt 160 lb 3.2 oz (72.7 kg)   BMI 25.86 kg/m   Wt Readings from Last 3 Encounters:  05/02/23 160 lb 3.2 oz (72.7 kg)  03/23/23 162 lb 6.4 oz (73.7 kg)  01/25/23 160 lb 3.2 oz (72.7 kg)     BP Readings from Last 3 Encounters:  05/02/23 102/64  04/29/23 122/68  03/23/23 126/63      Physical Exam- Limited  Constitutional:  Body mass index is 25.86 kg/m. , not in acute distress, normal state of mind Eyes:  EOMI, no exophthalmos Neck: Supple Musculoskeletal: no gross deformities, strength intact in all four extremities, no gross restriction of joint movements Skin:  no rashes, no hyperemia Neurological: no tremor with outstretched hands    CMP ( most recent) CMP     Component Value Date/Time   NA 139 03/23/2023 1529   K 5.1 03/23/2023 1529   CL 101 03/23/2023 1529   CO2 22 03/23/2023 1529   GLUCOSE 98 03/23/2023 1529   GLUCOSE 166 (H) 01/16/2023 0136   BUN 31 (H) 03/23/2023 1529   CREATININE 1.44 (H) 03/23/2023 1529   CALCIUM 9.4 03/23/2023 1529   PROT 6.8 01/13/2023 0315   PROT 7.0 12/27/2022 1206   ALBUMIN 3.5 01/13/2023 0315   ALBUMIN 4.3 12/27/2022 1206   AST 46 (H) 01/13/2023 0315   ALT 19 01/13/2023 0315   ALKPHOS 95 01/13/2023 0315   BILITOT 0.4 01/13/2023 0315   BILITOT 0.3 12/27/2022 1206   GFRNONAA 40 (L) 01/16/2023 0136   GFRAA  49 (L) 05/02/2020 1017     Diabetic Labs (most recent): Lab Results  Component Value Date   HGBA1C 7.8 (A) 05/02/2023   HGBA1C 9.4 (H) 01/12/2023   HGBA1C 9.3 (H) 12/06/2022     Lipid Panel ( most recent) Lipid Panel     Component Value Date/Time   CHOL 242 (H) 01/12/2023 2339   CHOL 325 (H) 12/06/2022 1116   TRIG 261 (H) 01/12/2023 2339   HDL 57 01/12/2023 2339   HDL 51 12/06/2022 1116   CHOLHDL 4.2 01/12/2023 2339   VLDL 52 (H) 01/12/2023 2339   LDLCALC 133 (H) 01/12/2023 2339   LDLCALC 247 (H) 12/06/2022 1116   LABVLDL 27 12/06/2022 1116      Lab Results  Component Value Date   TSH 2.160 12/27/2022   TSH 2.300 12/06/2022   TSH 1.540 08/03/2022   TSH 3.170 02/10/2022   TSH 1.990 09/30/2021   TSH 6.140 (H) 04/27/2021   TSH 1.850 12/25/2019   TSH 4.690 (H) 12/25/2018   TSH 4.060 05/20/2017   TSH 3.510 01/18/2017   FREET4 1.39 12/27/2022   FREET4 1.25 12/06/2022   FREET4 1.35 08/03/2022   FREET4 1.35 09/30/2021   FREET4 1.06 12/25/2019           Assessment & Plan:   1) Type 2 diabetes mellitus with stage 3b chronic kidney disease, with long-term current use of insulin (HCC)  She presents today with her meter showing at goal glycemic profile overall.  Her POCT A1c today is 7.8%, improving from last visit of 9.4%.  Analysis of her meter shows 7-day  average of 95, 14-day average of 107, 30-day average of 113.  She did have 1 low glucose of 56, but says she did not eat her meal that time.  - Angela Rose has currently uncontrolled symptomatic type 2 DM since 77 years of age.   -Recent labs reviewed.  - I had a long discussion with her about the progressive nature of diabetes and the pathology behind its complications. -her diabetes is complicated by CAD with MI and CKD stage 3 and she remains at a high risk for more acute and chronic complications which include CAD, CVA, CKD, retinopathy, and neuropathy. These are all discussed in detail with  her.  The following Lifestyle Medicine recommendations according to American College of Lifestyle Medicine Advanced Pain Management) were discussed and offered to patient and she agrees to start the journey:  A. Whole Foods, Plant-based plate comprising of fruits and vegetables, plant-based proteins, whole-grain carbohydrates was discussed in detail with the patient.   A list for source of those nutrients were also provided to the patient.  Patient will use only water or unsweetened tea for hydration. B.  The need to stay away from risky substances including alcohol, smoking; obtaining 7 to 9 hours of restorative sleep, at least 150 minutes of moderate intensity exercise weekly, the importance of healthy social connections,  and stress reduction techniques were discussed. C.  A full color page of  Calorie density of various food groups per pound showing examples of each food groups was provided to the patient.  - Nutritional counseling repeated at each appointment due to patients tendency to fall back in to old habits.  - The patient admits there is a room for improvement in their diet and drink choices. -  Suggestion is made for the patient to avoid simple carbohydrates from their diet including Cakes, Sweet Desserts / Pastries, Ice Cream, Soda (diet and regular), Sweet Tea, Candies, Chips, Cookies, Sweet Pastries, Store Bought Juices, Alcohol in Excess of 1-2 drinks a day, Artificial Sweeteners, Coffee Creamer, and "Sugar-free" Products. This will help patient to have stable blood glucose profile and potentially avoid unintended weight gain.   - I encouraged the patient to switch to unprocessed or minimally processed complex starch and increased protein intake (animal or plant source), fruits, and vegetables.   - Patient is advised to stick to a routine mealtimes to eat 3 meals a day and avoid unnecessary snacks (to snack only to correct hypoglycemia).  - I have approached her with the following individualized plan  to manage her diabetes and patient agrees:   - She is advised to continue her Evaristo Bury (using up current supply of Levemir) 16 units SQ nightly, continue her Metformin 500 mg ER daily with breakfast , and continue Farxiga 10 mg po daily (per cardiology).  -she is encouraged to continue monitoring blood glucose twice daily, before breakfast and before bed, and to call the clinic if she has readings less than 70 or above 300 for 3 tests in a row.   - she is warned not to take insulin without proper monitoring per orders. - Adjustment parameters are given to her for hypo and hyperglycemia in writing.  - she is not an ideal candidate for incretin therapy due to body habitus and BMI of 25.  - Specific targets for  A1c; LDL, HDL, and Triglycerides were discussed with the patient.  2) Blood Pressure /Hypertension:  her blood pressure is controlled to target.   she is advised to continue her current medications  as prescribed by cardiology.  3) Lipids/Hyperlipidemia:    Review of her recent lipid panel from 01/12/23 showed uncontrolled LDL at 133 and elevated triglycerides of 261 .  she is advised to continue Repatha 140 mg every 14 days.  Side effects and precautions discussed with her.  4)  Weight/Diet:  her Body mass index is 25.86 kg/m.  -  she is NOT a candidate for weight loss.  Exercise, and detailed carbohydrates information provided  -  detailed on discharge instructions.  5) Hypothyroidism- RAI induced She was diagnosed with hyperthyroidism over 15 years ago which subsequently led to her RAI ablation and now hypothyroidism.    There are no recent TFTs to review.  She is advised to continue Levothyroxine 50 mcg po daily before breakfast.  Will recheck prior to next visit and adjust dose accordingly.   - The correct intake of thyroid hormone (Levothyroxine, Synthroid), is on empty stomach first thing in the morning, with water, separated by at least 30 minutes from breakfast and other  medications,  and separated by more than 4 hours from calcium, iron, multivitamins, acid reflux medications (PPIs).  - This medication is a life-long medication and will be needed to correct thyroid hormone imbalances for the rest of your life.  The dose may change from time to time, based on thyroid blood work.  - It is extremely important to be consistent taking this medication, near the same time each morning.  -AVOID TAKING PRODUCTS CONTAINING BIOTIN (commonly found in Hair, Skin, Nails vitamins) AS IT INTERFERES WITH THE VALIDITY OF THYROID FUNCTION BLOOD TESTS.  6) Chronic Care/Health Maintenance: -she is on ACEI/ARB and not on Statin medications and is encouraged to initiate and continue to follow up with Ophthalmology, Dentist, Podiatrist at least yearly or according to recommendations, and advised to stay away from smoking. I have recommended yearly flu vaccine and pneumonia vaccine at least every 5 years; moderate intensity exercise for up to 150 minutes weekly; and sleep for at least 7 hours a day.  - she is advised to maintain close follow up with Durwin Nora Lucina Mellow, MD for primary care needs, as well as her other providers for optimal and coordinated care.      I spent  46  minutes in the care of the patient today including review of labs from CMP, Lipids, Thyroid Function, Hematology (current and previous including abstractions from other facilities); face-to-face time discussing  her blood glucose readings/logs, discussing hypoglycemia and hyperglycemia episodes and symptoms, medications doses, her options of short and long term treatment based on the latest standards of care / guidelines;  discussion about incorporating lifestyle medicine;  and documenting the encounter. Risk reduction counseling performed per USPSTF guidelines to reduce obesity and cardiovascular risk factors.     Please refer to Patient Instructions for Blood Glucose Monitoring and Insulin/Medications Dosing Guide"   in media tab for additional information. Please  also refer to " Patient Self Inventory" in the Media  tab for reviewed elements of pertinent patient history.  Angela Rose participated in the discussions, expressed understanding, and voiced agreement with the above plans.  All questions were answered to her satisfaction. she is encouraged to contact clinic should she have any questions or concerns prior to her return visit.     Follow up plan: - Return in about 4 months (around 09/02/2023) for Diabetes F/U with A1c in office, Thyroid follow up, Previsit labs, Bring meter and logs.   Ronny Bacon, FNP-BC [facility]  [address] [place], [place] [address] Phone: [phone] Fax: [phone]  05/02/2023, 2:16 PM

## 2023-05-02 NOTE — Patient Instructions (Signed)

## 2023-05-03 ENCOUNTER — Encounter (HOSPITAL_COMMUNITY)
Admission: RE | Admit: 2023-05-03 | Discharge: 2023-05-03 | Disposition: A | Discharge status: Home / Self Care | Payer: Medicare HMO | Source: Ambulatory Visit | Attending: Cardiology | Admitting: Cardiology

## 2023-05-03 VITALS — Ht 60.25 in | Wt 160.8 lb

## 2023-05-03 DIAGNOSIS — I2129 ST elevation (STEMI) myocardial infarction involving other sites: Secondary | ICD-10-CM | POA: Insufficient documentation

## 2023-05-03 NOTE — Progress Notes (Signed)
Cardiac Individual Treatment Plan  Patient Details  Name: Angela Rose MRN: [id number] Date of Birth: 27-Dec-1945 Referring Provider:   Flowsheet Row CARDIAC REHAB PHASE II ORIENTATION from 05/03/2023 in [facility] CARDIAC REHABILITATION  Referring Provider [name] MD  [Attending Cardiologist: Dr. Corrie Dandy Branch]       Initial Encounter Date:  Flowsheet Row CARDIAC REHAB PHASE II ORIENTATION from 05/03/2023 in [place] CARDIAC REHABILITATION  Date 05/03/23       Visit Diagnosis: ST elevation myocardial infarction (STEMI) involving other coronary artery (HCC)  Patient's Home Medications on Admission:  Current Outpatient Medications:    aspirin 81 MG tablet, Take by mouth every evening. , Disp: , Rfl:    azelastine (ASTELIN) 0.1 % nasal spray, Place 1 spray into both nostrils 2 (two) times daily. Use in each nostril as directed, Disp: 30 mL, Rfl: 1   b complex vitamins capsule, Take 1 capsule by mouth daily., Disp: , Rfl:    clopidogrel (PLAVIX) 75 MG tablet, Take 1 tablet (75 mg total) by mouth daily., Disp: 90 tablet, Rfl: 3   dapagliflozin propanediol (FARXIGA) 10 MG TABS tablet, Take 1 tablet (10 mg total) by mouth daily., Disp: 30 tablet, Rfl: 11   Evolocumab (REPATHA) 140 MG/ML SOSY, Inject 140 mg into the skin every 14 (fourteen) days., Disp: 2.1 mL, Rfl: 3   ezetimibe (ZETIA) 10 MG tablet, Take 1 tablet (10 mg total) by mouth daily., Disp: 90 tablet, Rfl: 3   FLUoxetine (PROZAC) 40 MG capsule, Take 1 capsule (40 mg total) by mouth daily., Disp: 90 capsule, Rfl: 3   Glucose Blood (BLOOD GLUCOSE TEST STRIPS) STRP, Use to check glucose twice daily, Disp: 100 strip, Rfl: 11   glucose blood test strip, Frequency:PHARMDIR   Dosage:0.0     Instructions:  Note:testing 3-4xqd, DX Code 250.00 Dose: 1, Disp: , Rfl:    insulin degludec (TRESIBA FLEXTOUCH) 100 UNIT/ML FlexTouch Pen, Inject 16 Units into the skin at bedtime., Disp: 4.5 mL, Rfl: 2   Insulin Pen Needle (BD PEN  NEEDLE NANO U/F) 32G X 4 MM MISC, Use to check glucose once daily, Disp: 100 each, Rfl: 6   levothyroxine (SYNTHROID) 50 MCG tablet, Take 1 tablet (50 mcg total) by mouth daily before breakfast. (Patient taking differently: Take 50 mcg by mouth at bedtime.), Disp: 90 tablet, Rfl: 3   metFORMIN (GLUCOPHAGE-XR) 500 MG 24 hr tablet, Take 1 tablet (500 mg total) by mouth daily with breakfast. Resume on 01/18/23, Disp: 90 tablet, Rfl: 3   metoprolol succinate (TOPROL-XL) 25 MG 24 hr tablet, Take 1 tablet (25 mg total) by mouth daily., Disp: 90 tablet, Rfl: 3   nitroGLYCERIN (NITROSTAT) 0.4 MG SL tablet, Place 1 tablet (0.4 mg total) under the tongue every 5 (five) minutes as needed for chest pain., Disp: 25 tablet, Rfl: 3   sacubitril-valsartan (ENTRESTO) 24-26 MG, Take 1 tablet by mouth 2 (two) times daily., Disp: 180 tablet, Rfl: 3   sodium chloride (OCEAN) 0.65 % SOLN nasal spray, Place 1 spray into both nostrils as needed for congestion., Disp: 30 mL, Rfl: 0   spironolactone (ALDACTONE) 25 MG tablet, Take 0.5 tablets (12.5 mg total) by mouth daily., Disp: 45 tablet, Rfl: 3   Study - LIBREXIA-ACS - milvexian 25 mg or placebo tablet (PI-Stuckey), Take 1 tablet by mouth 2 (two) times daily. Take 1 tablet by mouth twice a day with or without food. Bring bottle back to every visit. Please contact [facility] for any questions or  concerns regarding the medication, Disp: 280 tablet, Rfl: 0   Vitamin D, Ergocalciferol, (DRISDOL) 1.25 MG (50000 UNIT) CAPS capsule, Take 1 capsule (50,000 Units total) by mouth every 7 (seven) days., Disp: 10 capsule, Rfl: 1   cetirizine (ZYRTEC) 5 MG tablet, Take 1 tablet (5 mg total) by mouth daily. (Patient not taking: Reported on 04/19/2023), Disp: 30 tablet, Rfl: 0   furosemide (LASIX) 40 MG tablet, Take 1 tablet (40 mg) for 3 lb weight gain overnight. (Patient not taking: Reported on 05/02/2023), Disp: 30 tablet, Rfl: 11  Past Medical History: Past Medical History:   Diagnosis Date   Anxiety 07/10/2014   Cancer (HCC) 12/13/2011   melanoma face   CHF (congestive heart failure), NYHA class II, acute on chronic, combined (HCC) 01/14/2023   Chronic kidney disease (CKD), stage III (moderate) (HCC) 02/17/2022   Chronic sinusitis 07/02/2022   Depression 07/10/2014   Diabetes mellitus without complication (HCC) 08/21/2014   Fracture of tibial plateau 12/31/2015   Hyperlipidemia 08/14/2015   Hypertension 09/03/2014   Hypothyroidism, postablative 02/08/2014   Myocardial infarction (HCC) 1993   Tick bites 10/29/2014   Vitamin D deficiency 12/08/2022    Tobacco Use: Social History   Tobacco Use  Smoking Status Former   Current packs/day: 0.00   Types: Cigarettes   Quit date: 06/11/1982   Years since quitting: 40.9  Smokeless Tobacco Never  Tobacco Comments   smoking cessation materials not required    Labs: Review Flowsheet  More data exists      Latest Ref Rng & Units 02/10/2022 05/10/2022 12/06/2022 01/12/2023 05/02/2023  Labs for ITP Cardiac and Pulmonary Rehab  Cholestrol 0 - 200 mg/dL 161  096  045  409  -  LDL (calc) 0 - 99 mg/dL 84  811  914  782  -  HDL-C >40 mg/dL 47  51  51  57  -  Trlycerides <150 mg/dL 956  213  086  578  -  Hemoglobin A1c 4.0 - 5.6 % 7.9  - 9.3  9.4  7.8     Details            Capillary Blood Glucose: Lab Results  Component Value Date   GLUCAP 152 (H) 01/16/2023   GLUCAP 205 (H) 01/15/2023   GLUCAP 255 (H) 01/15/2023   GLUCAP 123 (H) 01/15/2023   GLUCAP 157 (H) 01/15/2023     Exercise Target Goals: Exercise Program Goal: Individual exercise prescription set using results from initial 6 min walk test and THRR while considering  patient's activity barriers and safety.   Exercise Prescription Goal: Starting with aerobic activity 30 plus minutes a day, 3 days per week for initial exercise prescription. Provide home exercise prescription and guidelines that participant acknowledges understanding prior to  discharge.  Activity Barriers & Risk Stratification:  Activity Barriers & Cardiac Risk Stratification - 05/03/23 1247       Activity Barriers & Cardiac Risk Stratification   Activity Barriers Joint Problems;Other (comment);Deconditioning;Muscular Weakness;Balance Concerns;Back Problems;Decreased Ventricular Function    Comments previous L knee injury, buldging disc occassional pain, can't floor currently    Cardiac Risk Stratification High             6 Minute Walk:  6 Minute Walk     Row Name 05/03/23 1420         6 Minute Walk   Phase Initial     Distance 794 feet     Walk Time 6 minutes     # of  Rest Breaks 0     MPH 1.5     METS 1.24     RPE 13     Perceived Dyspnea  2     VO2 Peak 4.37     Symptoms Yes (comment)     Comments chest tightness (3/10), harder to breathe/SOB     Resting HR 56 bpm     Resting BP 132/74     Resting Oxygen Saturation  97 %     Exercise Oxygen Saturation  during 6 min walk 96 %     Max Ex. HR 99 bpm     Max Ex. BP 136/64     2 Minute Post BP 106/62              Oxygen Initial Assessment:   Oxygen Re-Evaluation:   Oxygen Discharge (Final Oxygen Re-Evaluation):   Initial Exercise Prescription:  Initial Exercise Prescription - 05/03/23 1400       Date of Initial Exercise RX and Referring Provider   Date 05/03/23    Referring Provider [name] MD   Attending Cardiologist: Dr. Carolan Clines     Oxygen   Maintain Oxygen Saturation 88% or higher      Treadmill   MPH 1.2    Grade 0    Minutes 15    METs 1.92      NuStep   Level 1    SPM 80    Minutes 15    METs 1.5      Prescription Details   Frequency (times per week) 3    Duration Progress to 30 minutes of continuous aerobic without signs/symptoms of physical distress      Intensity   THRR 40-80% of Max Heartrate 90-124    Ratings of Perceived Exertion 11-13    Perceived Dyspnea 0-4      Progression   Progression Continue to progress workloads to  maintain intensity without signs/symptoms of physical distress.      Resistance Training   Training Prescription Yes    Weight 3 lb    Reps 10-15             Perform Capillary Blood Glucose checks as needed.  Exercise Prescription Changes:   Exercise Prescription Changes     Row Name 05/03/23 1400             Response to Exercise   Blood Pressure (Admit) 132/74       Blood Pressure (Exercise) 136/64       Blood Pressure (Exit) 106/62       Heart Rate (Admit) 56 bpm       Heart Rate (Exercise) 99 bpm       Heart Rate (Exit) 62 bpm       Oxygen Saturation (Admit) 97 %       Oxygen Saturation (Exercise) 96 %       Rating of Perceived Exertion (Exercise) 13       Perceived Dyspnea (Exercise) 2       Symptoms chest tightness 3/10, harder to breathe/SOB       Comments walk test results                Exercise Comments:   Exercise Goals and Review:   Exercise Goals     Row Name 05/03/23 1424             Exercise Goals   Increase Physical Activity Yes       Intervention Provide advice, education, support  and counseling about physical activity/exercise needs.;Develop an individualized exercise prescription for aerobic and resistive training based on initial evaluation findings, risk stratification, comorbidities and participant's personal goals.       Expected Outcomes Short Term: Attend rehab on a regular basis to increase amount of physical activity.;Long Term: Add in home exercise to make exercise part of routine and to increase amount of physical activity.;Long Term: Exercising regularly at least 3-5 days a week.       Increase Strength and Stamina Yes       Intervention Provide advice, education, support and counseling about physical activity/exercise needs.;Develop an individualized exercise prescription for aerobic and resistive training based on initial evaluation findings, risk stratification, comorbidities and participant's personal goals.        Expected Outcomes Short Term: Increase workloads from initial exercise prescription for resistance, speed, and METs.;Short Term: Perform resistance training exercises routinely during rehab and add in resistance training at home;Long Term: Improve cardiorespiratory fitness, muscular endurance and strength as measured by increased METs and functional capacity ( )       Able to understand and use rate of perceived exertion (RPE) scale Yes       Intervention Provide education and explanation on how to use RPE scale       Expected Outcomes Short Term: Able to use RPE daily in rehab to express subjective intensity level;Long Term:  Able to use RPE to guide intensity level when exercising independently       Able to understand and use Dyspnea scale Yes       Intervention Provide education and explanation on how to use Dyspnea scale       Expected Outcomes Short Term: Able to use Dyspnea scale daily in rehab to express subjective sense of shortness of breath during exertion;Long Term: Able to use Dyspnea scale to guide intensity level when exercising independently       Knowledge and understanding of Target Heart Rate Range (THRR) Yes       Intervention Provide education and explanation of THRR including how the numbers were predicted and where they are located for reference       Expected Outcomes Short Term: Able to state/look up THRR;Long Term: Able to use THRR to govern intensity when exercising independently;Short Term: Able to use daily as guideline for intensity in rehab       Able to check pulse independently Yes       Intervention Provide education and demonstration on how to check pulse in carotid and radial arteries.;Review the importance of being able to check your own pulse for safety during independent exercise       Expected Outcomes Short Term: Able to explain why pulse checking is important during independent exercise;Long Term: Able to check pulse independently and accurately        Understanding of Exercise Prescription Yes       Intervention Provide education, explanation, and written materials on patient's individual exercise prescription       Expected Outcomes Short Term: Able to explain program exercise prescription;Long Term: Able to explain home exercise prescription to exercise independently                Exercise Goals Re-Evaluation :  Exercise Goals Re-Evaluation     Row Name 05/03/23 1425             Exercise Goal Re-Evaluation   Exercise Goals Review Knowledge and understanding of Target Heart Rate Range (THRR);Able to understand and use Dyspnea scale;Understanding of  Exercise Prescription       Comments Reviewed RPE  and dyspnea scale and program prescription with pt today.  Pt voiced understanding and was given a copy of goals to take home.       Expected Outcomes Short: Use RPE daily to regulate intensity.  Long: Follow program prescription                 Discharge Exercise Prescription (Final Exercise Prescription Changes):  Exercise Prescription Changes - 05/03/23 1400       Response to Exercise   Blood Pressure (Admit) 132/74    Blood Pressure (Exercise) 136/64    Blood Pressure (Exit) 106/62    Heart Rate (Admit) 56 bpm    Heart Rate (Exercise) 99 bpm    Heart Rate (Exit) 62 bpm    Oxygen Saturation (Admit) 97 %    Oxygen Saturation (Exercise) 96 %    Rating of Perceived Exertion (Exercise) 13    Perceived Dyspnea (Exercise) 2    Symptoms chest tightness 3/10, harder to breathe/SOB    Comments walk test results             Nutrition:  Target Goals: Understanding of nutrition guidelines, daily intake of sodium 1500mg , cholesterol 200mg , calories 30% from fat and 7% or less from saturated fats, daily to have 5 or more servings of fruits and vegetables.  Biometrics:  Pre Biometrics - 05/03/23 1425       Pre Biometrics   Height 5' 0.25" (1.53 m)    Weight 72.9 kg    Waist Circumference 36.5 inches    Hip  Circumference 39.5 inches    Waist to Hip Ratio 0.92 %    BMI (Calculated) 31.16    Triceps Skinfold 20 mm    % Body Fat 41.1 %    Grip Strength 19.6 kg    Single Leg Stand 2 seconds              Nutrition Therapy Plan and Nutrition Goals:  Nutrition Therapy & Goals - 05/03/23 1307       Intervention Plan   Intervention Prescribe, educate and counsel regarding individualized specific dietary modifications aiming towards targeted core components such as weight, hypertension, lipid management, diabetes, heart failure and other comorbidities.;Nutrition handout(s) given to patient.    Expected Outcomes Short Term Goal: Understand basic principles of dietary content, such as calories, fat, sodium, cholesterol and nutrients.             Nutrition Assessments:  MEDIFICTS Score Key: ?70 Need to make dietary changes  40-70 Heart Healthy Diet ? 40 Therapeutic Level Cholesterol Diet  Flowsheet Row CARDIAC REHAB PHASE II ORIENTATION from 05/03/2023 in [facility] CARDIAC [facility]ILITATION  Picture Your Plate Total Score on Admission 71      Picture Your Plate Scores: <16 Unhealthy dietary pattern with much room for improvement. 41-50 Dietary pattern unlikely to meet recommendations for good health and room for improvement. 51-60 More healthful dietary pattern, with some room for improvement.  >60 Healthy dietary pattern, although there may be some specific behaviors that could be improved.    Nutrition Goals Re-Evaluation:   Nutrition Goals Discharge (Final Nutrition Goals Re-Evaluation):   Psychosocial: Target Goals: Acknowledge presence or absence of significant depression and/or stress, maximize coping skills, provide positive support system. Participant is able to verbalize types and ability to use techniques and skills needed for reducing stress and depression.  Initial Review & Psychosocial Screening:  Initial Psych Review & Screening -  05/03/23 1259       Initial  Review   Current issues with Current Stress Concerns;Current Depression;Current Anxiety/Panic;Current Sleep Concerns    Source of Stress Concerns Family;Chronic Illness;Unable to perform yard/household activities;Unable to participate in former interests or hobbies    Comments ex-husband can be stressful sometimes, heart health was unexpected and trying to recover and not be able to go and do, situational depression/anxiety, partner is a [employer] loss adjuster, got bed bugs last week from something from [employer] and now it keeps her up at night looking out for things, PHQ is a 10, participating in heart study      Family Dynamics   Good Support System? Yes   significant other of 21 yrs, 3 grown kids     Barriers   Psychosocial barriers to participate in program Psychosocial barriers identified (see note);The patient should benefit from training in stress management and relaxation.      Screening Interventions   Interventions Encouraged to exercise;Provide feedback about the scores to participant;To provide support and resources with identified psychosocial needs    Expected Outcomes Short Term goal: Utilizing psychosocial counselor, staff and physician to assist with identification of specific Stressors or current issues interfering with healing process. Setting desired goal for each stressor or current issue identified.;Long Term Goal: Stressors or current issues are controlled or eliminated.;Short Term goal: Identification and review with participant of any Quality of Life or Depression concerns found by scoring the questionnaire.;Long Term goal: The participant improves quality of Life and PHQ9 Scores as seen by post scores and/or verbalization of changes             Quality of Life Scores:  Quality of Life - 05/03/23 1426       Quality of Life   Select Quality of Life      Quality of Life Scores   Health/Function Pre 13.25 %    Socioeconomic Pre 11.83 %    Psych/Spiritual Pre 13.17 %    Family  Pre 18.2 %    GLOBAL Pre 13.76 %            Scores of 19 and below usually indicate a poorer quality of life in these areas.  A difference of  2-3 points is a clinically meaningful difference.  A difference of 2-3 points in the total score of the Quality of Life Index has been associated with significant improvement in overall quality of life, self-image, physical symptoms, and general health in studies assessing change in quality of life.  PHQ-9: Review Flowsheet  More data exists      05/03/2023 03/23/2023 01/05/2023 12/08/2022 09/07/2022  Depression screen PHQ 2/9  Decreased Interest 0 1 1 2 1   Down, Depressed, Hopeless 2 1 1 1  0  PHQ - 2 Score 2 2 2 3 1   Altered sleeping 3 2 - 0 0  Tired, decreased energy 3 0 - 1 1  Change in appetite 0 0 - 0 0  Feeling bad or failure about yourself  0 0 - 0 0  Trouble concentrating 1 0 - 0 0  Moving slowly or fidgety/restless 1 0 - 0 0  Suicidal thoughts 0 0 - 0 0  PHQ-9 Score 10 4 - 4 2  Difficult doing work/chores Very difficult - - Somewhat difficult Somewhat difficult    Details           Interpretation of Total Score  Total Score Depression Severity:  1-4 = Minimal depression, 5-9 = Mild depression, 10-14 =  Moderate depression, 15-19 = Moderately severe depression, 20-27 = Severe depression   Psychosocial Evaluation and Intervention:  Psychosocial Evaluation - 05/03/23 1429       Psychosocial Evaluation & Interventions   Interventions Stress management education;Relaxation education;Encouraged to exercise with the program and follow exercise prescription    Comments Susen is coming into cardiac rehab after a STEMI in April.  She has a previous heart history including MI and CABG.  She was unsure why she suddenly had a heart attack and went into heart failure. She was excited to be able to come off of her LifeVest last week after her TEE showed improvement!!  She has a great support system in her partner of 21 years.  They look out  for each other but he is a bad influence on her eating, but she has recently lost almost 50 lb since taking over her diet herself!!  She wants to get moving again and to feel better again.  She wants to be able to walk without having to stop and rest for her breath and chest pain.  She normally is a good sleeper, but recently they got a bout of bed bugs and she wakes in the night hunting for them.  She is hoping to be rid of them soon!  She did not have any traces of bed bugs on her today and was encouraged to continue to launder her bedding to clear them up.  She is retired and on a limited monthly income, but makes the best of it.  One of her stressors is her exhusband when she has to deal with him from time to time.  She does have some occassional depression symptoms especially since she isn't able to go and do like she would like to currently.  Her PHQ is currently a 10 and we will reassess in a month.  Currently attributted to not sleeping well and not haveing the energy that she would like to have.  She also wants to build up her strength and stamina overall.  She has no barriers to attending rehab other than her fatigue levels.  She is eage to get started to get to feeling better.    Expected Outcomes Short: Attend rehab to build up strength and stamina Long: Continue to work on improving sleep overall.    Continue Psychosocial Services  Follow up required by staff             Psychosocial Re-Evaluation:   Psychosocial Discharge (Final Psychosocial Re-Evaluation):   Vocational Rehabilitation: Provide vocational rehab assistance to qualifying candidates.   Vocational Rehab Evaluation & Intervention:  Vocational Rehab - 05/03/23 1258       Initial Vocational Rehab Evaluation & Intervention   Assessment shows need for Vocational Rehabilitation No   retired            Education: Education Goals: Education classes will be provided on a weekly basis, covering required topics.  Participant will state understanding/return demonstration of topics presented.  Learning Barriers/Preferences:  Learning Barriers/Preferences - 05/03/23 1247       Learning Barriers/Preferences   Learning Barriers Hearing   R hearing aids, L deaf   Learning Preferences None             Education Topics: Hypertension, Hypertension Reduction -Define heart disease and high blood pressure. Discus how high blood pressure affects the body and ways to reduce high blood pressure.   Exercise and Your Heart -Discuss why it is important to exercise, the [facility]  principles of exercise, normal and abnormal responses to exercise, and how to exercise safely.   Angina -Discuss definition of angina, causes of angina, treatment of angina, and how to decrease risk of having angina.   Cardiac Medications -Review what the following cardiac medications are used for, how they affect the body, and side effects that may occur when taking the medications.  Medications include Aspirin, Beta blockers, calcium channel blockers, ACE Inhibitors, angiotensin receptor blockers, diuretics, digoxin, and antihyperlipidemics.   Congestive Heart Failure -Discuss the definition of CHF, how to live with CHF, the signs and symptoms of CHF, and how keep track of weight and sodium intake.   Heart Disease and Intimacy -Discus the effect sexual activity has on the heart, how changes occur during intimacy as we age, and safety during sexual activity.   Smoking Cessation / COPD -Discuss different methods to quit smoking, the health benefits of quitting smoking, and the definition of COPD.   Nutrition I: Fats -Discuss the types of cholesterol, what cholesterol does to the heart, and how cholesterol levels can be controlled.   Nutrition II: Labels -Discuss the different components of food labels and how to read food label   Heart Parts/Heart Disease and PAD -Discuss the anatomy of the heart, the pathway of blood  circulation through the heart, and these are affected by heart disease.   Stress I: Signs and Symptoms -Discuss the causes of stress, how stress may lead to anxiety and depression, and ways to limit stress.   Stress II: Relaxation -Discuss different types of relaxation techniques to limit stress.   Warning Signs of Stroke / TIA -Discuss definition of a stroke, what the signs and symptoms are of a stroke, and how to identify when someone is having stroke.   Knowledge Questionnaire Score:  Knowledge Questionnaire Score - 05/03/23 1309       Knowledge Questionnaire Score   Pre Score 22/24             Core Components/Risk Factors/Patient Goals at Admission:  Personal Goals and Risk Factors at Admission - 05/03/23 1258       Core Components/Risk Factors/Patient Goals on Admission    Weight Management Yes;Obesity;Weight Maintenance;Weight Loss    Intervention Weight Management: Develop a combined nutrition and exercise program designed to reach desired caloric intake, while maintaining appropriate intake of nutrient and fiber, sodium and fats, and appropriate energy expenditure required for the weight goal.;Weight Management: Provide education and appropriate resources to help participant work on and attain dietary goals.;Weight Management/Obesity: Establish reasonable short term and long term weight goals.;Obesity: Provide education and appropriate resources to help participant work on and attain dietary goals.    Admit Weight 160 lb 12.8 oz (72.9 kg)    Goal Weight: Short Term 155 lb (70.3 kg)    Goal Weight: Long Term 155 lb (70.3 kg)    Expected Outcomes Short Term: Continue to assess and modify interventions until short term weight is achieved;Long Term: Adherence to nutrition and physical activity/exercise program aimed toward attainment of established weight goal;Weight Loss: Understanding of general recommendations for a balanced deficit meal plan, which promotes 1-2 lb weight  loss per week and includes a negative energy balance of [phone] kcal/d;Understanding recommendations for meals to include 15-35% energy as protein, 25-35% energy from fat, 35-60% energy from carbohydrates, less than 200mg  of dietary cholesterol, 20-35 gm of total fiber daily;Understanding of distribution of calorie intake throughout the day with the consumption of 4-5 meals/snacks;Weight Maintenance: Understanding of the daily nutrition  guidelines, which includes 25-35% calories from fat, 7% or less cal from saturated fats, less than 200mg  cholesterol, less than 1.5gm of sodium, & 5 or more servings of fruits and vegetables daily    Diabetes Yes    Intervention Provide education about signs/symptoms and action to take for hypo/hyperglycemia.;Provide education about proper nutrition, including hydration, and aerobic/resistive exercise prescription along with prescribed medications to achieve blood glucose in normal ranges: Fasting glucose 65-99 mg/dL    Expected Outcomes Short Term: Participant verbalizes understanding of the signs/symptoms and immediate care of hyper/hypoglycemia, proper foot care and importance of medication, aerobic/resistive exercise and nutrition plan for blood glucose control.;Long Term: Attainment of HbA1C < 7%.    Heart Failure Yes    Intervention Provide a combined exercise and nutrition program that is supplemented with education, support and counseling about heart failure. Directed toward relieving symptoms such as shortness of breath, decreased exercise tolerance, and extremity edema.    Expected Outcomes Improve functional capacity of life;Short term: Attendance in program 2-3 days a week with increased exercise capacity. Reported lower sodium intake. Reported increased fruit and vegetable intake. Reports medication compliance.;Short term: Daily weights obtained and reported for increase. Utilizing diuretic protocols set by physician.;Long term: Adoption of self-care skills and  reduction of barriers for early signs and symptoms recognition and intervention leading to self-care maintenance.    Hypertension Yes    Intervention Provide education on lifestyle modifcations including regular physical activity/exercise, weight management, moderate sodium restriction and increased consumption of fresh fruit, vegetables, and low fat dairy, alcohol moderation, and smoking cessation.;Monitor prescription use compliance.    Expected Outcomes Short Term: Continued assessment and intervention until BP is < 140/42mm HG in hypertensive participants. < 130/12mm HG in hypertensive participants with diabetes, heart failure or chronic kidney disease.;Long Term: Maintenance of blood pressure at goal levels.    Lipids Yes    Intervention Provide education and support for participant on nutrition & aerobic/resistive exercise along with prescribed medications to achieve LDL 70mg , HDL >40mg .    Expected Outcomes Short Term: Participant states understanding of desired cholesterol values and is compliant with medications prescribed. Participant is following exercise prescription and nutrition guidelines.;Long Term: Cholesterol controlled with medications as prescribed, with individualized exercise RX and with personalized nutrition plan. Value goals: LDL < 70mg , HDL > 40 mg.             Core Components/Risk Factors/Patient Goals Review:    Core Components/Risk Factors/Patient Goals at Discharge (Final Review):    ITP Comments:  ITP Comments     Row Name 05/03/23 1418           ITP Comments Patient attend orientation today.  Patient is attendingCardiac Rehabilitation Program.  Documentation for diagnosis can be found in [facility] encounter 01/22/23.  Reviewed medical chart, RPE/RPD, gym safety, and program guidelines.  Patient was fitted to equipment they will be using during rehab.  Patient is scheduled to start exercise on 05/04/23.   Initial ITP created and sent for review and signature by Dr.  Dina Rich, Medical Director for Cardiac Rehabilitation Program.                Comments: Initial ITP

## 2023-05-03 NOTE — Patient Instructions (Signed)
Patient Instructions  Patient Details  Name: Angela Rose MRN: [id number] Date of Birth: Feb 26, 1946 Referring Provider:  Marykay Lex, MD  Below are your personal goals for exercise, nutrition, and risk factors. Our goal is to help you stay on track towards obtaining and maintaining these goals. We will be discussing your progress on these goals with you throughout the program.  Initial Exercise Prescription:  Initial Exercise Prescription - 05/03/23 1400       Date of Initial Exercise RX and Referring Provider   Date 05/03/23    Referring Provider [name] MD   Attending Cardiologist: Dr. Carolan Clines     Oxygen   Maintain Oxygen Saturation 88% or higher      Treadmill   MPH 1.2    Grade 0    Minutes 15    METs 1.92      NuStep   Level 1    SPM 80    Minutes 15    METs 1.5      Prescription Details   Frequency (times per week) 3    Duration Progress to 30 minutes of continuous aerobic without signs/symptoms of physical distress      Intensity   THRR 40-80% of Max Heartrate 90-124    Ratings of Perceived Exertion 11-13    Perceived Dyspnea 0-4      Progression   Progression Continue to progress workloads to maintain intensity without signs/symptoms of physical distress.      Resistance Training   Training Prescription Yes    Weight 3 lb    Reps 10-15             Exercise Goals: Frequency: Be able to perform aerobic exercise two to three times per week in program working toward 2-5 days per week of home exercise.  Intensity: Work with a perceived exertion of 11 (fairly light) - 15 (hard) while following your exercise prescription.  We will make changes to your prescription with you as you progress through the program.   Duration: Be able to do 30 to 45 minutes of continuous aerobic exercise in addition to a 5 minute warm-up and a 5 minute cool-down routine.   Nutrition Goals: Your personal nutrition goals will be established when you do  your nutrition analysis with the dietician.  The following are general nutrition guidelines to follow: Cholesterol < 200mg /day Sodium < 1500mg /day Fiber: Women over 50 yrs - 21 grams per day  Personal Goals:  Personal Goals and Risk Factors at Admission - 05/03/23 1258       Core Components/Risk Factors/Patient Goals on Admission    Weight Management Yes;Obesity;Weight Maintenance;Weight Loss    Intervention Weight Management: Develop a combined nutrition and exercise program designed to reach desired caloric intake, while maintaining appropriate intake of nutrient and fiber, sodium and fats, and appropriate energy expenditure required for the weight goal.;Weight Management: Provide education and appropriate resources to help participant work on and attain dietary goals.;Weight Management/Obesity: Establish reasonable short term and long term weight goals.;Obesity: Provide education and appropriate resources to help participant work on and attain dietary goals.    Admit Weight 160 lb 12.8 oz (72.9 kg)    Goal Weight: Short Term 155 lb (70.3 kg)    Goal Weight: Long Term 155 lb (70.3 kg)    Expected Outcomes Short Term: Continue to assess and modify interventions until short term weight is achieved;Long Term: Adherence to nutrition and physical activity/exercise program aimed toward attainment of established weight goal;Weight Loss:  Understanding of general recommendations for a balanced deficit meal plan, which promotes 1-2 lb weight loss per week and includes a negative energy balance of [phone] kcal/d;Understanding recommendations for meals to include 15-35% energy as protein, 25-35% energy from fat, 35-60% energy from carbohydrates, less than 200mg  of dietary cholesterol, 20-35 gm of total fiber daily;Understanding of distribution of calorie intake throughout the day with the consumption of 4-5 meals/snacks;Weight Maintenance: Understanding of the daily nutrition guidelines, which includes  25-35% calories from fat, 7% or less cal from saturated fats, less than 200mg  cholesterol, less than 1.5gm of sodium, & 5 or more servings of fruits and vegetables daily    Diabetes Yes    Intervention Provide education about signs/symptoms and action to take for hypo/hyperglycemia.;Provide education about proper nutrition, including hydration, and aerobic/resistive exercise prescription along with prescribed medications to achieve blood glucose in normal ranges: Fasting glucose 65-99 mg/dL    Expected Outcomes Short Term: Participant verbalizes understanding of the signs/symptoms and immediate care of hyper/hypoglycemia, proper foot care and importance of medication, aerobic/resistive exercise and nutrition plan for blood glucose control.;Long Term: Attainment of HbA1C < 7%.    Heart Failure Yes    Intervention Provide a combined exercise and nutrition program that is supplemented with education, support and counseling about heart failure. Directed toward relieving symptoms such as shortness of breath, decreased exercise tolerance, and extremity edema.    Expected Outcomes Improve functional capacity of life;Short term: Attendance in program 2-3 days a week with increased exercise capacity. Reported lower sodium intake. Reported increased fruit and vegetable intake. Reports medication compliance.;Short term: Daily weights obtained and reported for increase. Utilizing diuretic protocols set by physician.;Long term: Adoption of self-care skills and reduction of barriers for early signs and symptoms recognition and intervention leading to self-care maintenance.    Hypertension Yes    Intervention Provide education on lifestyle modifcations including regular physical activity/exercise, weight management, moderate sodium restriction and increased consumption of fresh fruit, vegetables, and low fat dairy, alcohol moderation, and smoking cessation.;Monitor prescription use compliance.    Expected Outcomes Short  Term: Continued assessment and intervention until BP is < 140/73mm HG in hypertensive participants. < 130/65mm HG in hypertensive participants with diabetes, heart failure or chronic kidney disease.;Long Term: Maintenance of blood pressure at goal levels.    Lipids Yes    Intervention Provide education and support for participant on nutrition & aerobic/resistive exercise along with prescribed medications to achieve LDL 70mg , HDL >40mg .    Expected Outcomes Short Term: Participant states understanding of desired cholesterol values and is compliant with medications prescribed. Participant is following exercise prescription and nutrition guidelines.;Long Term: Cholesterol controlled with medications as prescribed, with individualized exercise RX and with personalized nutrition plan. Value goals: LDL < 70mg , HDL > 40 mg.             Tobacco Use Initial Evaluation: Social History   Tobacco Use  Smoking Status Former   Current packs/day: 0.00   Types: Cigarettes   Quit date: 06/11/1982   Years since quitting: 40.9  Smokeless Tobacco Never  Tobacco Comments   smoking cessation materials not required    Exercise Goals and Review:  Exercise Goals     Row Name 05/03/23 1424             Exercise Goals   Increase Physical Activity Yes       Intervention Provide advice, education, support and counseling about physical activity/exercise needs.;Develop an individualized exercise prescription for aerobic and resistive training based  on initial evaluation findings, risk stratification, comorbidities and participant's personal goals.       Expected Outcomes Short Term: Attend rehab on a regular basis to increase amount of physical activity.;Long Term: Add in home exercise to make exercise part of routine and to increase amount of physical activity.;Long Term: Exercising regularly at least 3-5 days a week.       Increase Strength and Stamina Yes       Intervention Provide advice, education,  support and counseling about physical activity/exercise needs.;Develop an individualized exercise prescription for aerobic and resistive training based on initial evaluation findings, risk stratification, comorbidities and participant's personal goals.       Expected Outcomes Short Term: Increase workloads from initial exercise prescription for resistance, speed, and METs.;Short Term: Perform resistance training exercises routinely during rehab and add in resistance training at home;Long Term: Improve cardiorespiratory fitness, muscular endurance and strength as measured by increased METs and functional capacity ( )       Able to understand and use rate of perceived exertion (RPE) scale Yes       Intervention Provide education and explanation on how to use RPE scale       Expected Outcomes Short Term: Able to use RPE daily in rehab to express subjective intensity level;Long Term:  Able to use RPE to guide intensity level when exercising independently       Able to understand and use Dyspnea scale Yes       Intervention Provide education and explanation on how to use Dyspnea scale       Expected Outcomes Short Term: Able to use Dyspnea scale daily in rehab to express subjective sense of shortness of breath during exertion;Long Term: Able to use Dyspnea scale to guide intensity level when exercising independently       Knowledge and understanding of Target Heart Rate Range (THRR) Yes       Intervention Provide education and explanation of THRR including how the numbers were predicted and where they are located for reference       Expected Outcomes Short Term: Able to state/look up THRR;Long Term: Able to use THRR to govern intensity when exercising independently;Short Term: Able to use daily as guideline for intensity in rehab       Able to check pulse independently Yes       Intervention Provide education and demonstration on how to check pulse in carotid and radial arteries.;Review the importance of  being able to check your own pulse for safety during independent exercise       Expected Outcomes Short Term: Able to explain why pulse checking is important during independent exercise;Long Term: Able to check pulse independently and accurately       Understanding of Exercise Prescription Yes       Intervention Provide education, explanation, and written materials on patient's individual exercise prescription       Expected Outcomes Short Term: Able to explain program exercise prescription;Long Term: Able to explain home exercise prescription to exercise independently              Copy of goals given to participant.

## 2023-05-04 ENCOUNTER — Encounter (HOSPITAL_COMMUNITY)
Admission: RE | Admit: 2023-05-04 | Discharge: 2023-05-04 | Disposition: A | Discharge status: Home / Self Care | Payer: Medicare HMO | Source: Ambulatory Visit | Attending: Cardiology | Admitting: Cardiology

## 2023-05-04 DIAGNOSIS — I2129 ST elevation (STEMI) myocardial infarction involving other sites: Secondary | ICD-10-CM | POA: Diagnosis not present

## 2023-05-04 LAB — GLUCOSE, CAPILLARY
Glucose-Capillary: 179 mg/dL — ABNORMAL HIGH (ref 70–99)
Glucose-Capillary: 141 mg/dL — ABNORMAL HIGH (ref 70–99)

## 2023-05-04 NOTE — Progress Notes (Signed)
Daily Session Note  Patient Details  Name: Angela Rose MRN: [id number] Date of Birth: [birth year] Referring Provider:   Flowsheet Row CARDIAC REHAB PHASE II ORIENTATION from 05/03/2023 in [facility] CARDIAC REHABILITATION  Referring Provider [name] MD  [Attending Cardiologist: Dr. Carolan Clines       Encounter Date: 05/04/2023  Check In:  Session Check In - 05/04/23 1100       Check-In   Supervising physician immediately available to respond to emergencies See telemetry face sheet for immediately available MD    Location [facility]    Staff Present Ross Ludwig, BS, Exercise Physiologist;Hillary Leonidas Romberg BSN, RN    Virtual Visit No    Medication changes reported     No    Fall or balance concerns reported    No    Tobacco Cessation No Change    Warm-up and Cool-down Performed on first and last piece of equipment    Resistance Training Performed Yes    VAD Patient? No    PAD/SET Patient? No      Pain Assessment   Currently in Pain? No/denies    Multiple Pain Sites No             Capillary Blood Glucose: Results for orders placed or performed during the hospital encounter of 05/03/23 (from the past 24 hour(s))  Glucose, capillary     Status: Abnormal   Collection Time: 05/04/23 10:53 AM  Result Value Ref Range   Glucose-Capillary 179 (H) 70 - 99 mg/dL      Social History   Tobacco Use  Smoking Status Former   Current packs/day: 0.00   Types: Cigarettes   Quit date: 06/11/1982   Years since quitting: 40.9  Smokeless Tobacco Never  Tobacco Comments   smoking cessation materials not required    Goals Met:  Independence with exercise equipment Exercise tolerated well No report of concerns or symptoms today Strength training completed today  Goals Unmet:  Not Applicable  Comments: First full day of exercise!  Patient was oriented to gym and equipment including functions, settings, policies, and procedures.  Patient's  individual exercise prescription and treatment plan were reviewed.  All starting workloads were established based on the results of the 6 minute walk test done at initial orientation visit.  The plan for exercise progression was also introduced and progression will be customized based on patient's performance and goals.    Dr. Dina Rich is Medical Director for [facility] Cardiac Rehab

## 2023-05-06 ENCOUNTER — Encounter (HOSPITAL_COMMUNITY)
Admission: RE | Admit: 2023-05-06 | Discharge: 2023-05-06 | Disposition: A | Discharge status: Home / Self Care | Payer: Medicare HMO | Source: Ambulatory Visit | Attending: Cardiology | Admitting: Cardiology

## 2023-05-06 DIAGNOSIS — I2129 ST elevation (STEMI) myocardial infarction involving other sites: Secondary | ICD-10-CM

## 2023-05-06 LAB — GLUCOSE, CAPILLARY
Glucose-Capillary: 201 mg/dL — ABNORMAL HIGH (ref 70–99)
Glucose-Capillary: 160 mg/dL — ABNORMAL HIGH (ref 70–99)

## 2023-05-06 NOTE — Progress Notes (Signed)
Daily Session Note  Patient Details  Name: Angela Rose MRN: [id number] Date of Birth: 13-Jan-1946 Referring Provider:   Flowsheet Row CARDIAC REHAB PHASE II ORIENTATION from 05/03/2023 in [facility] CARDIAC REHABILITATION  Referring Provider [name] MD  [Attending Cardiologist: Dr. Carolan Clines       Encounter Date: 05/06/2023  Check In:  Session Check In - 05/06/23 1106       Check-In   Supervising physician immediately available to respond to emergencies See telemetry face sheet for immediately available MD    Location [facility]    Staff Present Ross Ludwig, BS, Exercise Physiologist;Shamere Campas Juanetta Gosling, MA, RCEP, CCRP, Dow Adolph, RN, BSN    Virtual Visit No    Medication changes reported     No    Fall or balance concerns reported    No    Warm-up and Cool-down Performed on first and last piece of equipment    Resistance Training Performed Yes    VAD Patient? No    PAD/SET Patient? No      Pain Assessment   Currently in Pain? No/denies             Capillary Blood Glucose: No results found for this or any previous visit (from the past 24 hour(s)).    Social History   Tobacco Use  Smoking Status Former   Current packs/day: 0.00   Types: Cigarettes   Quit date: 06/11/1982   Years since quitting: 40.9  Smokeless Tobacco Never  Tobacco Comments   smoking cessation materials not required    Goals Met:  Exercise tolerated well No report of concerns or symptoms today Strength training completed today  Goals Unmet:  Not Applicable  Comments: Pt able to follow exercise prescription today without complaint.  Will continue to monitor for progression.  Spoke to pt about bedbugs again.  She says they have not seen any over the last two nights.  Asked her to keep [place] updated.    Dr. Dina Rich is Medical Director for [facility] Cardiac Rehab

## 2023-05-09 ENCOUNTER — Encounter (HOSPITAL_COMMUNITY): Admission: RE | Admit: 2023-05-09 | Payer: Medicare HMO | Source: Ambulatory Visit

## 2023-05-09 DIAGNOSIS — I2129 ST elevation (STEMI) myocardial infarction involving other sites: Secondary | ICD-10-CM | POA: Diagnosis not present

## 2023-05-09 NOTE — Progress Notes (Signed)
Daily Session Note  Patient Details  Name: Angela Rose MRN: [id number] Date of Birth: Apr 11, 1946 Referring Provider:   Flowsheet Row CARDIAC REHAB PHASE II ORIENTATION from 05/03/2023 in [facility] CARDIAC REHABILITATION  Referring Provider [name] MD  [Attending Cardiologist: Dr. Carolan Clines       Encounter Date: 05/09/2023  Check In:  Session Check In - 05/09/23 1101       Check-In   Supervising physician immediately available to respond to emergencies See telemetry face sheet for immediately available MD    Location [facility]    Staff Present Ross Ludwig, BS, Exercise Physiologist;Daphyne Daphine Deutscher, RN, BSN    Virtual Visit No    Medication changes reported     No    Fall or balance concerns reported    No    Tobacco Cessation No Change    Warm-up and Cool-down Performed on first and last piece of equipment   patient was late for warm up   Resistance Training Performed Yes    VAD Patient? No    PAD/SET Patient? No      Pain Assessment   Currently in Pain? No/denies    Multiple Pain Sites No             Capillary Blood Glucose: No results found for this or any previous visit (from the past 24 hour(s)).    Social History   Tobacco Use  Smoking Status Former   Current packs/day: 0.00   Types: Cigarettes   Quit date: 06/11/1982   Years since quitting: 40.9  Smokeless Tobacco Never  Tobacco Comments   smoking cessation materials not required    Goals Met:  Independence with exercise equipment Exercise tolerated well No report of concerns or symptoms today Strength training completed today  Goals Unmet:  Not Applicable  Comments: Pt able to follow exercise prescription today without complaint.  Will continue to monitor for progression.    Dr. Dina Rich is Medical Director for [facility] Cardiac Rehab

## 2023-05-11 ENCOUNTER — Encounter (HOSPITAL_COMMUNITY)
Admission: RE | Admit: 2023-05-11 | Discharge: 2023-05-11 | Disposition: A | Discharge status: Home / Self Care | Payer: Medicare HMO | Source: Ambulatory Visit | Attending: Cardiology | Admitting: Cardiology

## 2023-05-11 DIAGNOSIS — I2129 ST elevation (STEMI) myocardial infarction involving other sites: Secondary | ICD-10-CM

## 2023-05-11 LAB — GLUCOSE, CAPILLARY
Glucose-Capillary: 257 mg/dL — ABNORMAL HIGH (ref 70–99)
Glucose-Capillary: 144 mg/dL — ABNORMAL HIGH (ref 70–99)

## 2023-05-11 NOTE — Progress Notes (Signed)
Daily Session Note  Patient Details  Name: Angela Rose MRN: [id number] Date of Birth: 1946-05-15 Referring Provider:   Flowsheet Row CARDIAC REHAB PHASE II ORIENTATION from 05/03/2023 in [facility] CARDIAC REHABILITATION  Referring Provider [name] MD  [Attending Cardiologist: Dr. Carolan Clines       Encounter Date: 05/11/2023  Check In:  Session Check In - 05/11/23 1145       Check-In   Supervising physician immediately available to respond to emergencies See telemetry face sheet for immediately available MD    Location [facility]    Staff Present Ross Ludwig, BS, Exercise Physiologist;Jadea Shiffer Juanetta Gosling, MA, RCEP, CCRP, CCET;Hillary Troutman BSN, RN    Virtual Visit No    Medication changes reported     No    Fall or balance concerns reported    No    Warm-up and Cool-down Performed on first and last piece of equipment    Resistance Training Performed Yes    VAD Patient? No    PAD/SET Patient? No      Pain Assessment   Currently in Pain? No/denies             Capillary Blood Glucose: Results for orders placed or performed during the hospital encounter of 05/09/23 (from the past 24 hour(s))  Glucose, capillary     Status: Abnormal   Collection Time: 05/11/23 11:05 AM  Result Value Ref Range   Glucose-Capillary 257 (H) 70 - 99 mg/dL      Social History   Tobacco Use  Smoking Status Former   Current packs/day: 0.00   Types: Cigarettes   Quit date: 06/11/1982   Years since quitting: 40.9  Smokeless Tobacco Never  Tobacco Comments   smoking cessation materials not required    Goals Met:  Exercise tolerated well Personal goals reviewed No report of concerns or symptoms today Strength training completed today  Goals Unmet:  Not Applicable  Comments: Pt able to follow exercise prescription today without complaint.  Will continue to monitor for progression.    Dr. Dina Rich is Medical Director for [facility] Cardiac  Rehab

## 2023-05-13 ENCOUNTER — Encounter (HOSPITAL_COMMUNITY)
Admission: RE | Admit: 2023-05-13 | Discharge: 2023-05-13 | Disposition: A | Discharge status: Home / Self Care | Payer: Medicare HMO | Source: Ambulatory Visit | Attending: Cardiology | Admitting: Cardiology

## 2023-05-13 DIAGNOSIS — I2129 ST elevation (STEMI) myocardial infarction involving other sites: Secondary | ICD-10-CM | POA: Insufficient documentation

## 2023-05-13 LAB — GLUCOSE, CAPILLARY: Glucose-Capillary: 141 mg/dL — ABNORMAL HIGH (ref 70–99)

## 2023-05-13 NOTE — Progress Notes (Signed)
Daily Session Note  Patient Details  Name: Angela Rose MRN: [id number] Date of Birth: [birth year] Referring Provider:   Flowsheet Row CARDIAC REHAB PHASE II ORIENTATION from 05/03/2023 in [facility] CARDIAC REHABILITATION  Referring Provider [name] MD  [Attending Cardiologist: Dr. Carolan Clines       Encounter Date: 05/13/2023  Check In:  Session Check In - 05/13/23 1100       Check-In   Supervising physician immediately available to respond to emergencies See telemetry face sheet for immediately available MD    Location [facility]    Staff Present Ross Ludwig, BS, Exercise Physiologist;Jessica Juanetta Gosling, MA, RCEP, CCRP, Harolyn Rutherford, RN, BSN    Virtual Visit No    Medication changes reported     No    Fall or balance concerns reported    No    Tobacco Cessation No Change    Warm-up and Cool-down Performed on first and last piece of equipment    Resistance Training Performed Yes    VAD Patient? No      Pain Assessment   Currently in Pain? No/denies             Capillary Blood Glucose: Results for orders placed or performed during the hospital encounter of 05/11/23 (from the past 24 hour(s))  Glucose, capillary     Status: Abnormal   Collection Time: 05/13/23 10:53 AM  Result Value Ref Range   Glucose-Capillary 141 (H) 70 - 99 mg/dL      Social History   Tobacco Use  Smoking Status Former   Current packs/day: 0.00   Types: Cigarettes   Quit date: 06/11/1982   Years since quitting: 40.9  Smokeless Tobacco Never  Tobacco Comments   smoking cessation materials not required    Goals Met:  Independence with exercise equipment Exercise tolerated well No report of concerns or symptoms today Strength training completed today  Goals Unmet:  Not Applicable  Comments: Pt able to follow exercise prescription today without complaint.  Will continue to monitor for progression.    Dr. Erick Blinks is Medical Director for  [facility] Pulmonary Rehab.

## 2023-05-16 ENCOUNTER — Encounter (HOSPITAL_COMMUNITY)
Admission: RE | Admit: 2023-05-16 | Discharge: 2023-05-16 | Disposition: A | Discharge status: Home / Self Care | Payer: Medicare HMO | Source: Ambulatory Visit | Attending: Cardiology | Admitting: Cardiology

## 2023-05-16 DIAGNOSIS — I2129 ST elevation (STEMI) myocardial infarction involving other sites: Secondary | ICD-10-CM

## 2023-05-16 NOTE — Progress Notes (Signed)
Daily Session Note  Patient Details  Name: Angela Rose MRN: [id number] Date of Birth: 08-15-1946 Referring Provider:   Flowsheet Row CARDIAC REHAB PHASE II ORIENTATION from 05/03/2023 in [facility] CARDIAC REHABILITATION  Referring Provider [name] MD  [Attending Cardiologist: Dr. Carolan Clines       Encounter Date: 05/16/2023  Check In:  Session Check In - 05/16/23 1144       Check-In   Supervising physician immediately available to respond to emergencies See telemetry face sheet for immediately available ER MD    Location [facility]    Staff Present Erskine Speed, Margarite Gouge, RN, BSN;Other    Virtual Visit No    Medication changes reported     No    Fall or balance concerns reported    No    Tobacco Cessation No Change    Warm-up and Cool-down Performed on first and last piece of equipment    Resistance Training Performed Yes    VAD Patient? No    PAD/SET Patient? No      Pain Assessment   Currently in Pain? No/denies             Capillary Blood Glucose: No results found for this or any previous visit (from the past 24 hour(s)).    Social History   Tobacco Use  Smoking Status Former   Current packs/day: 0.00   Types: Cigarettes   Quit date: 06/11/1982   Years since quitting: 40.9  Smokeless Tobacco Never  Tobacco Comments   smoking cessation materials not required    Goals Met:  Independence with exercise equipment Exercise tolerated well No report of concerns or symptoms today  Goals Unmet:  Not Applicable  Comments: Pt able to follow exercise prescription today without complaint.  Will continue to monitor for progression.    Dr. Dina Rich is Medical Director for [facility] Cardiac Rehab

## 2023-05-18 ENCOUNTER — Encounter (HOSPITAL_COMMUNITY)
Admission: RE | Admit: 2023-05-18 | Discharge: 2023-05-18 | Disposition: A | Discharge status: Home / Self Care | Payer: Medicare HMO | Source: Ambulatory Visit | Attending: Cardiology | Admitting: Cardiology

## 2023-05-18 DIAGNOSIS — I2129 ST elevation (STEMI) myocardial infarction involving other sites: Secondary | ICD-10-CM | POA: Diagnosis not present

## 2023-05-18 NOTE — Progress Notes (Signed)
Daily Session Note  Patient Details  Name: Angela Rose MRN: [id number] Date of Birth: 1946-03-31 Referring Provider:   Flowsheet Row CARDIAC REHAB PHASE II ORIENTATION from 05/03/2023 in [facility] CARDIAC REHABILITATION  Referring Provider [name] MD  [Attending Cardiologist: Dr. Carolan Clines       Encounter Date: 05/18/2023  Check In:  Session Check In - 05/18/23 1050       Check-In   Supervising physician immediately available to respond to emergencies See telemetry face sheet for immediately available ER MD    Location [facility]    Staff Present Rodena Medin, RN, Pleas Koch, RN, BSN    Virtual Visit No    Medication changes reported     No    Fall or balance concerns reported    No    Warm-up and Cool-down Performed as group-led Writer Performed Yes    VAD Patient? No    PAD/SET Patient? No      Pain Assessment   Currently in Pain? No/denies    Multiple Pain Sites No             Capillary Blood Glucose: No results found for this or any previous visit (from the past 24 hour(s)).    Social History   Tobacco Use  Smoking Status Former   Current packs/day: 0.00   Types: Cigarettes   Quit date: 06/11/1982   Years since quitting: 40.9  Smokeless Tobacco Never  Tobacco Comments   smoking cessation materials not required    Goals Met:  Independence with exercise equipment Exercise tolerated well No report of concerns or symptoms today Strength training completed today  Goals Unmet:  Not Applicable  Comments: Check out 1200.   Dr. Dina Rich is Medical Director for [facility] Cardiac Rehab

## 2023-05-20 ENCOUNTER — Encounter (HOSPITAL_COMMUNITY)
Admission: RE | Admit: 2023-05-20 | Discharge: 2023-05-20 | Disposition: A | Discharge status: Home / Self Care | Payer: Medicare HMO | Source: Ambulatory Visit | Attending: Cardiology | Admitting: Cardiology

## 2023-05-20 DIAGNOSIS — I2129 ST elevation (STEMI) myocardial infarction involving other sites: Secondary | ICD-10-CM | POA: Diagnosis not present

## 2023-05-20 NOTE — Progress Notes (Signed)
Daily Session Note  Patient Details  Name: Angela Rose MRN: [id number] Date of Birth: Aug 18, 1946 Referring Provider:   Flowsheet Row CARDIAC REHAB PHASE II ORIENTATION from 05/03/2023 in [facility] CARDIAC REHABILITATION  Referring Provider [name] MD  [Attending Cardiologist: Dr. Carolan Clines       Encounter Date: 05/20/2023  Check In:  Session Check In - 05/20/23 1055       Check-In   Supervising physician immediately available to respond to emergencies See telemetry face sheet for immediately available ER MD    Location [facility]    Staff Present Rodena Medin, RN, Pleas Koch, RN, BSN    Virtual Visit No    Medication changes reported     No    Fall or balance concerns reported    No    Warm-up and Cool-down Performed on first and last piece of equipment    Resistance Training Performed Yes    VAD Patient? No    PAD/SET Patient? No      Pain Assessment   Currently in Pain? No/denies    Multiple Pain Sites No             Capillary Blood Glucose: No results found for this or any previous visit (from the past 24 hour(s)).    Social History   Tobacco Use  Smoking Status Former   Current packs/day: 0.00   Types: Cigarettes   Quit date: 06/11/1982   Years since quitting: 40.9  Smokeless Tobacco Never  Tobacco Comments   smoking cessation materials not required    Goals Met:  Independence with exercise equipment Exercise tolerated well No report of concerns or symptoms today Strength training completed today  Goals Unmet:  Not Applicable  Comments: Pt able to follow exercise prescription today without complaint.  Will continue to monitor for progression.    Dr. Dina Rich is Medical Director for [facility] Cardiac Rehab

## 2023-05-23 ENCOUNTER — Encounter (HOSPITAL_COMMUNITY)
Admission: RE | Admit: 2023-05-23 | Discharge: 2023-05-23 | Disposition: A | Discharge status: Home / Self Care | Payer: Medicare HMO | Source: Ambulatory Visit | Attending: Cardiology | Admitting: Cardiology

## 2023-05-23 DIAGNOSIS — I2129 ST elevation (STEMI) myocardial infarction involving other sites: Secondary | ICD-10-CM

## 2023-05-23 NOTE — Progress Notes (Signed)
Daily Session Note  Patient Details  Name: Angela Rose MRN: [id number] Date of Birth: 04-13-1946 Referring Provider:   Flowsheet Row CARDIAC REHAB PHASE II ORIENTATION from 05/03/2023 in [facility] CARDIAC REHABILITATION  Referring Provider [name] MD  [Attending Cardiologist: Dr. Carolan Clines       Encounter Date: 05/23/2023  Check In:  Session Check In - 05/23/23 1030       Check-In   Supervising physician immediately available to respond to emergencies See telemetry face sheet for immediately available MD    Location [facility]    Staff Present Staci Righter, RN, Neal Dy, RN, BSN;Heather Fredric Mare, BS, Exercise Physiologist    Virtual Visit No    Medication changes reported     No    Fall or balance concerns reported    No    Tobacco Cessation No Change    Warm-up and Cool-down Performed on first and last piece of equipment    Resistance Training Performed Yes    VAD Patient? No      Pain Assessment   Currently in Pain? No/denies             Capillary Blood Glucose: No results found for this or any previous visit (from the past 24 hour(s)).    Social History   Tobacco Use  Smoking Status Former   Current packs/day: 0.00   Types: Cigarettes   Quit date: 06/11/1982   Years since quitting: 40.9  Smokeless Tobacco Never  Tobacco Comments   smoking cessation materials not required    Goals Met:  Independence with exercise equipment Exercise tolerated well No report of concerns or symptoms today Strength training completed today  Goals Unmet:  Not Applicable  Comments: Pt able to follow exercise prescription today without complaint.  Will continue to monitor for progression.    Dr. Dina Rich is Medical Director for [facility] Cardiac Rehab

## 2023-05-25 ENCOUNTER — Encounter (HOSPITAL_COMMUNITY): Payer: Self-pay | Admitting: *Deleted

## 2023-05-25 ENCOUNTER — Encounter (HOSPITAL_COMMUNITY)
Admission: RE | Admit: 2023-05-25 | Discharge: 2023-05-25 | Disposition: A | Discharge status: Home / Self Care | Payer: Medicare HMO | Source: Ambulatory Visit | Attending: Cardiology | Admitting: Cardiology

## 2023-05-25 DIAGNOSIS — I2129 ST elevation (STEMI) myocardial infarction involving other sites: Secondary | ICD-10-CM

## 2023-05-25 NOTE — Progress Notes (Signed)
Daily Session Note  Patient Details  Name: Angela Rose MRN: [id number] Date of Birth: 1945/10/17 Referring Provider:   Flowsheet Row CARDIAC REHAB PHASE II ORIENTATION from 05/03/2023 in [facility] CARDIAC REHABILITATION  Referring Provider [name] MD  [Attending Cardiologist: Dr. Carolan Clines       Encounter Date: 05/25/2023  Check In:  Session Check In - 05/25/23 1025       Check-In   Supervising physician immediately available to respond to emergencies See telemetry face sheet for immediately available ER MD    Location [facility]    Staff Present Rodena Medin, RN, BSN;Jessica Juanetta Gosling, MA, RCEP, CCRP, CCET;[employer], RN;Heather [place], [place], Exercise Physiologist    Virtual Visit No    Medication changes reported     No    Fall or balance concerns reported    No    Warm-up and Cool-down Performed on first and last piece of equipment    Resistance Training Performed Yes    VAD Patient? No    PAD/SET Patient? No      Pain Assessment   Currently in Pain? No/denies    Multiple Pain Sites No             Capillary Blood Glucose: No results found for this or any previous visit (from the past 24 hour(s)).    Social History   Tobacco Use  Smoking Status Former   Current packs/day: 0.00   Types: Cigarettes   Quit date: 06/11/1982   Years since quitting: 40.9  Smokeless Tobacco Never  Tobacco Comments   smoking cessation materials not required    Goals Met:  Independence with exercise equipment Exercise tolerated well No report of concerns or symptoms today Strength training completed today  Goals Unmet:  Not Applicable  Comments: Pt able to follow exercise prescription today without complaint.  Will continue to monitor for progression.    Dr. Dina Rich is Medical Director for [facility] Cardiac Rehab

## 2023-05-25 NOTE — Progress Notes (Signed)
Cardiac Individual Treatment Plan  Patient Details  Name: Angela Rose MRN: [id number] Date of Birth: [birth year] Referring Provider:   Flowsheet Row CARDIAC REHAB PHASE II ORIENTATION from 05/03/2023 in [facility] CARDIAC REHABILITATION  Referring Provider [name] MD  [Attending Cardiologist: Dr. Corrie Dandy Branch]       Initial Encounter Date:  Flowsheet Row CARDIAC REHAB PHASE II ORIENTATION from 05/03/2023 in [place] CARDIAC REHABILITATION  Date 05/03/23       Visit Diagnosis: ST elevation myocardial infarction (STEMI) involving other coronary artery (HCC)  Patient's Home Medications on Admission:  Current Outpatient Medications:    aspirin 81 MG tablet, Take by mouth every evening. , Disp: , Rfl:    azelastine (ASTELIN) 0.1 % nasal spray, Place 1 spray into both nostrils 2 (two) times daily. Use in each nostril as directed, Disp: 30 mL, Rfl: 1   b complex vitamins capsule, Take 1 capsule by mouth daily., Disp: , Rfl:    cetirizine (ZYRTEC) 5 MG tablet, Take 1 tablet (5 mg total) by mouth daily. (Patient not taking: Reported on 04/19/2023), Disp: 30 tablet, Rfl: 0   clopidogrel (PLAVIX) 75 MG tablet, Take 1 tablet (75 mg total) by mouth daily., Disp: 90 tablet, Rfl: 3   dapagliflozin propanediol (FARXIGA) 10 MG TABS tablet, Take 1 tablet (10 mg total) by mouth daily., Disp: 30 tablet, Rfl: 11   Evolocumab (REPATHA) 140 MG/ML SOSY, Inject 140 mg into the skin every 14 (fourteen) days., Disp: 2.1 mL, Rfl: 3   ezetimibe (ZETIA) 10 MG tablet, Take 1 tablet (10 mg total) by mouth daily., Disp: 90 tablet, Rfl: 3   FLUoxetine (PROZAC) 40 MG capsule, Take 1 capsule (40 mg total) by mouth daily., Disp: 90 capsule, Rfl: 3   furosemide (LASIX) 40 MG tablet, Take 1 tablet (40 mg) for 3 lb weight gain overnight. (Patient not taking: Reported on 05/02/2023), Disp: 30 tablet, Rfl: 11   Glucose Blood (BLOOD GLUCOSE TEST STRIPS) STRP, Use to check glucose twice daily, Disp: 100 strip, Rfl:  11   glucose blood test strip, Frequency:PHARMDIR   Dosage:0.0     Instructions:  Note:testing 3-4xqd, DX Code 250.00 Dose: 1, Disp: , Rfl:    insulin degludec (TRESIBA FLEXTOUCH) 100 UNIT/ML FlexTouch Pen, Inject 16 Units into the skin at bedtime., Disp: 4.5 mL, Rfl: 2   Insulin Pen Needle (BD PEN NEEDLE NANO U/F) 32G X 4 MM MISC, Use to check glucose once daily, Disp: 100 each, Rfl: 6   levothyroxine (SYNTHROID) 50 MCG tablet, Take 1 tablet (50 mcg total) by mouth daily before breakfast. (Patient taking differently: Take 50 mcg by mouth at bedtime.), Disp: 90 tablet, Rfl: 3   metFORMIN (GLUCOPHAGE-XR) 500 MG 24 hr tablet, Take 1 tablet (500 mg total) by mouth daily with breakfast. Resume on 01/18/23, Disp: 90 tablet, Rfl: 3   metoprolol succinate (TOPROL-XL) 25 MG 24 hr tablet, Take 1 tablet (25 mg total) by mouth daily., Disp: 90 tablet, Rfl: 3   nitroGLYCERIN (NITROSTAT) 0.4 MG SL tablet, Place 1 tablet (0.4 mg total) under the tongue every 5 (five) minutes as needed for chest pain., Disp: 25 tablet, Rfl: 3   sacubitril-valsartan (ENTRESTO) 24-26 MG, Take 1 tablet by mouth 2 (two) times daily., Disp: 180 tablet, Rfl: 3   sodium chloride (OCEAN) 0.65 % SOLN nasal spray, Place 1 spray into both nostrils as needed for congestion., Disp: 30 mL, Rfl: 0   spironolactone (ALDACTONE) 25 MG tablet, Take 0.5 tablets (12.5 mg total)  by mouth daily., Disp: 45 tablet, Rfl: 3   Study - LIBREXIA-ACS - milvexian 25 mg or placebo tablet (PI-Stuckey), Take 1 tablet by mouth 2 (two) times daily. Take 1 tablet by mouth twice a day with or without food. Bring bottle back to every visit. Please contact [place] [facility] for any questions or concerns regarding the medication, Disp: 280 tablet, Rfl: 0   Vitamin D, Ergocalciferol, (DRISDOL) 1.25 MG (50000 UNIT) CAPS capsule, Take 1 capsule (50,000 Units total) by mouth every 7 (seven) days., Disp: 10 capsule, Rfl: 1  Past Medical History: Past Medical History:  Diagnosis  Date   Anxiety 07/10/2014   Cancer (HCC) 12/13/2011   melanoma face   CHF (congestive heart failure), NYHA class II, acute on chronic, combined (HCC) 01/14/2023   Chronic kidney disease (CKD), stage III (moderate) (HCC) 02/17/2022   Chronic sinusitis 07/02/2022   Depression 07/10/2014   Diabetes mellitus without complication (HCC) 08/21/2014   Fracture of tibial plateau 12/31/2015   Hyperlipidemia 08/14/2015   Hypertension 09/03/2014   Hypothyroidism, postablative 02/08/2014   Myocardial infarction (HCC) 1993   Tick bites 10/29/2014   Vitamin D deficiency 12/08/2022    Tobacco Use: Social History   Tobacco Use  Smoking Status Former   Current packs/day: 0.00   Types: Cigarettes   Quit date: 06/11/1982   Years since quitting: 40.9  Smokeless Tobacco Never  Tobacco Comments   smoking cessation materials not required    Labs: Review Flowsheet  More data exists      Latest Ref Rng & Units 02/10/2022 05/10/2022 12/06/2022 01/12/2023 05/02/2023  Labs for ITP Cardiac and Pulmonary Rehab  Cholestrol 0 - 200 mg/dL 563  875  643  329  -  LDL (calc) 0 - 99 mg/dL 84  518  841  660  -  HDL-C >40 mg/dL 47  51  51  57  -  Trlycerides <150 mg/dL 630  160  109  323  -  Hemoglobin A1c 4.0 - 5.6 % 7.9  - 9.3  9.4  7.8     Details            Capillary Blood Glucose: Lab Results  Component Value Date   GLUCAP 141 (H) 05/13/2023   GLUCAP 144 (H) 05/11/2023   GLUCAP 257 (H) 05/11/2023   GLUCAP 160 (H) 05/06/2023   GLUCAP 201 (H) 05/06/2023     Exercise Target Goals: Exercise Program Goal: Individual exercise prescription set using results from initial 6 min walk test and THRR while considering  patient's activity barriers and safety.   Exercise Prescription Goal: Starting with aerobic activity 30 plus minutes a day, 3 days per week for initial exercise prescription. Provide home exercise prescription and guidelines that participant acknowledges understanding prior to  discharge.  Activity Barriers & Risk Stratification:  Activity Barriers & Cardiac Risk Stratification - 05/03/23 1247       Activity Barriers & Cardiac Risk Stratification   Activity Barriers Joint Problems;Other (comment);Deconditioning;Muscular Weakness;Balance Concerns;Back Problems;Decreased Ventricular Function    Comments previous L knee injury, buldging disc occassional pain, can't floor currently    Cardiac Risk Stratification High             6 Minute Walk:  6 Minute Walk     Row Name 05/03/23 1420         6 Minute Walk   Phase Initial     Distance 794 feet     Walk Time 6 minutes     # of  Rest Breaks 0     MPH 1.5     METS 1.24     RPE 13     Perceived Dyspnea  2     VO2 Peak 4.37     Symptoms Yes (comment)     Comments chest tightness (3/10), harder to breathe/SOB     Resting HR 56 bpm     Resting BP 132/74     Resting Oxygen Saturation  97 %     Exercise Oxygen Saturation  during 6 min walk 96 %     Max Ex. HR 99 bpm     Max Ex. BP 136/64     2 Minute Post BP 106/62              Oxygen Initial Assessment:   Oxygen Re-Evaluation:   Oxygen Discharge (Final Oxygen Re-Evaluation):   Initial Exercise Prescription:  Initial Exercise Prescription - 05/03/23 1400       Date of Initial Exercise RX and Referring Provider   Date 05/03/23    Referring Provider [name] MD   Attending Cardiologist: Dr. Carolan Clines     Oxygen   Maintain Oxygen Saturation 88% or higher      Treadmill   MPH 1.2    Grade 0    Minutes 15    METs 1.92      NuStep   Level 1    SPM 80    Minutes 15    METs 1.5      Prescription Details   Frequency (times per week) 3    Duration Progress to 30 minutes of continuous aerobic without signs/symptoms of physical distress      Intensity   THRR 40-80% of Max Heartrate 90-124    Ratings of Perceived Exertion 11-13    Perceived Dyspnea 0-4      Progression   Progression Continue to progress workloads to  maintain intensity without signs/symptoms of physical distress.      Resistance Training   Training Prescription Yes    Weight 3 lb    Reps 10-15             Perform Capillary Blood Glucose checks as needed.  Exercise Prescription Changes:   Exercise Prescription Changes     Row Name 05/03/23 1400 05/09/23 1300           Response to Exercise   Blood Pressure (Admit) 132/74 120/60      Blood Pressure (Exercise) 136/64 120/60      Blood Pressure (Exit) 106/62 98/58      Heart Rate (Admit) 56 bpm 71 bpm      Heart Rate (Exercise) 99 bpm 82 bpm      Heart Rate (Exit) 62 bpm 69 bpm      Oxygen Saturation (Admit) 97 % --      Oxygen Saturation (Exercise) 96 % --      Rating of Perceived Exertion (Exercise) 13 12      Perceived Dyspnea (Exercise) 2 --      Symptoms chest tightness 3/10, harder to breathe/SOB --      Comments walk test results --      Duration -- Continue with 30 min of aerobic exercise without signs/symptoms of physical distress.      Intensity -- THRR unchanged        Progression   Progression -- Continue to progress workloads to maintain intensity without signs/symptoms of physical distress.        Resistance Training  Training Prescription -- Yes      Weight -- 2      Reps -- 10-15        Treadmill   MPH -- 1.2      Grade -- 0      Minutes -- 15      METs -- 1.92        NuStep   Level -- 2      SPM -- 69      Minutes -- 15      METs -- 1.8        Oxygen   Maintain Oxygen Saturation -- 88% or higher               Exercise Comments:   Exercise Goals and Review:   Exercise Goals     Row Name 05/03/23 1424             Exercise Goals   Increase Physical Activity Yes       Intervention Provide advice, education, support and counseling about physical activity/exercise needs.;Develop an individualized exercise prescription for aerobic and resistive training based on initial evaluation findings, risk stratification,  comorbidities and participant's personal goals.       Expected Outcomes Short Term: Attend rehab on a regular basis to increase amount of physical activity.;Long Term: Add in home exercise to make exercise part of routine and to increase amount of physical activity.;Long Term: Exercising regularly at least 3-5 days a week.       Increase Strength and Stamina Yes       Intervention Provide advice, education, support and counseling about physical activity/exercise needs.;Develop an individualized exercise prescription for aerobic and resistive training based on initial evaluation findings, risk stratification, comorbidities and participant's personal goals.       Expected Outcomes Short Term: Increase workloads from initial exercise prescription for resistance, speed, and METs.;Short Term: Perform resistance training exercises routinely during rehab and add in resistance training at home;Long Term: Improve cardiorespiratory fitness, muscular endurance and strength as measured by increased METs and functional capacity ( )       Able to understand and use rate of perceived exertion (RPE) scale Yes       Intervention Provide education and explanation on how to use RPE scale       Expected Outcomes Short Term: Able to use RPE daily in rehab to express subjective intensity level;Long Term:  Able to use RPE to guide intensity level when exercising independently       Able to understand and use Dyspnea scale Yes       Intervention Provide education and explanation on how to use Dyspnea scale       Expected Outcomes Short Term: Able to use Dyspnea scale daily in rehab to express subjective sense of shortness of breath during exertion;Long Term: Able to use Dyspnea scale to guide intensity level when exercising independently       Knowledge and understanding of Target Heart Rate Range (THRR) Yes       Intervention Provide education and explanation of THRR including how the numbers were predicted and where they  are located for reference       Expected Outcomes Short Term: Able to state/look up THRR;Long Term: Able to use THRR to govern intensity when exercising independently;Short Term: Able to use daily as guideline for intensity in rehab       Able to check pulse independently Yes       Intervention Provide education and demonstration  on how to check pulse in carotid and radial arteries.;Review the importance of being able to check your own pulse for safety during independent exercise       Expected Outcomes Short Term: Able to explain why pulse checking is important during independent exercise;Long Term: Able to check pulse independently and accurately       Understanding of Exercise Prescription Yes       Intervention Provide education, explanation, and written materials on patient's individual exercise prescription       Expected Outcomes Short Term: Able to explain program exercise prescription;Long Term: Able to explain home exercise prescription to exercise independently                Exercise Goals Re-Evaluation :  Exercise Goals Re-Evaluation     Row Name 05/03/23 1425 05/09/23 1348 05/11/23 1112         Exercise Goal Re-Evaluation   Exercise Goals Review Knowledge and understanding of Target Heart Rate Range (THRR);Able to understand and use Dyspnea scale;Understanding of Exercise Prescription -- Increase Physical Activity;Increase Strength and Stamina;Understanding of Exercise Prescription     Comments Reviewed RPE  and dyspnea scale and program prescription with pt today.  Pt voiced understanding and was given a copy of goals to take home. She is tolerating exercise well but has not increased on her workloads yet. She grips the treadmill when walking she states that it bothers her shoulders and arms Pt states that saw a difference in her strength after her first time in rehab.  She states that feels stronger and has been able to do more around the house such as cooking and cleaning.   Pt stated that "this is the best I have felt in a long time."  Pt is not exercising at home right now.  She states that the heat prevents her from walking outside, but she is interested in going to the [facility] to exercise.     Expected Outcomes Short: Use RPE daily to regulate intensity.  Long: Follow program prescription -- Short term:  Go over home exercise plan  Long term:  Pt will start exercising at [facility] or home               Discharge Exercise Prescription (Final Exercise Prescription Changes):  Exercise Prescription Changes - 05/09/23 1300       Response to Exercise   Blood Pressure (Admit) 120/60    Blood Pressure (Exercise) 120/60    Blood Pressure (Exit) 98/58    Heart Rate (Admit) 71 bpm    Heart Rate (Exercise) 82 bpm    Heart Rate (Exit) 69 bpm    Rating of Perceived Exertion (Exercise) 12    Duration Continue with 30 min of aerobic exercise without signs/symptoms of physical distress.    Intensity THRR unchanged      Progression   Progression Continue to progress workloads to maintain intensity without signs/symptoms of physical distress.      Resistance Training   Training Prescription Yes    Weight 2    Reps 10-15      Treadmill   MPH 1.2    Grade 0    Minutes 15    METs 1.92      NuStep   Level 2    SPM 69    Minutes 15    METs 1.8      Oxygen   Maintain Oxygen Saturation 88% or higher  Nutrition:  Target Goals: Understanding of nutrition guidelines, daily intake of sodium 1500mg , cholesterol 200mg , calories 30% from fat and 7% or less from saturated fats, daily to have 5 or more servings of fruits and vegetables.  Biometrics:  Pre Biometrics - 05/03/23 1425       Pre Biometrics   Height 5' 0.25" (1.53 m)    Weight 160 lb 12.8 oz (72.9 kg)    Waist Circumference 36.5 inches    Hip Circumference 39.5 inches    Waist to Hip Ratio 0.92 %    BMI (Calculated) 31.16    Triceps Skinfold 20 mm    % Body Fat 41.1 %    Grip  Strength 19.6 kg    Single Leg Stand 2 seconds              Nutrition Therapy Plan and Nutrition Goals:  Nutrition Therapy & Goals - 05/03/23 1307       Intervention Plan   Intervention Prescribe, educate and counsel regarding individualized specific dietary modifications aiming towards targeted core components such as weight, hypertension, lipid management, diabetes, heart failure and other comorbidities.;Nutrition handout(s) given to patient.    Expected Outcomes Short Term Goal: Understand basic principles of dietary content, such as calories, fat, sodium, cholesterol and nutrients.             Nutrition Assessments:  MEDIFICTS Score Key: ?70 Need to make dietary changes  40-70 Heart Healthy Diet ? 40 Therapeutic Level Cholesterol Diet  Flowsheet Row CARDIAC REHAB PHASE II ORIENTATION from 05/03/2023 in [facility] CARDIAC REHABILITATION  Picture Your Plate Total Score on Admission 71      Picture Your Plate Scores: <16 Unhealthy dietary pattern with much room for improvement. 41-50 Dietary pattern unlikely to meet recommendations for good health and room for improvement. 51-60 More healthful dietary pattern, with some room for improvement.  >60 Healthy dietary pattern, although there may be some specific behaviors that could be improved.    Nutrition Goals Re-Evaluation:  Nutrition Goals Re-Evaluation     Row Name 05/11/23 1143             Goals   Nutrition Goal Heart healthy diet       Comment Pt states that her diet has improved and she has lost quite a bit of weight.  Pt does not eat much red meat because she prefers to eat chicken.  She enjoys eating a small bowl of icecream a few times a week; otherwise, she tries to stay away from sweets except for fresh fruit.       Expected Outcome Short term:  Pt is happy with her eating habits-maintain focus on healthy eating               Long term:  Pt will follow a heart healthy diet.                 Nutrition Goals Discharge (Final Nutrition Goals Re-Evaluation):  Nutrition Goals Re-Evaluation - 05/11/23 1143       Goals   Nutrition Goal Heart healthy diet    Comment Pt states that her diet has improved and she has lost quite a bit of weight.  Pt does not eat much red meat because she prefers to eat chicken.  She enjoys eating a small bowl of icecream a few times a week; otherwise, she tries to stay away from sweets except for fresh fruit.    Expected Outcome Short term:  Pt is happy with  her eating habits-maintain focus on healthy eating               Long term:  Pt will follow a heart healthy diet.             Psychosocial: Target Goals: Acknowledge presence or absence of significant depression and/or stress, maximize coping skills, provide positive support system. Participant is able to verbalize types and ability to use techniques and skills needed for reducing stress and depression.  Initial Review & Psychosocial Screening:  Initial Psych Review & Screening - 05/03/23 1259       Initial Review   Current issues with Current Stress Concerns;Current Depression;Current Anxiety/Panic;Current Sleep Concerns    Source of Stress Concerns Family;Chronic Illness;Unable to perform yard/household activities;Unable to participate in former interests or hobbies    Comments ex-husband can be stressful sometimes, heart health was unexpected and trying to recover and not be able to go and do, situational depression/anxiety, partner is a Chart[employer] loss adjuster, got bed bugs last week from something from [employer] and now it keeps her up at night looking out for things, PHQ is a 10, participating in heart study      Family Dynamics   Good Support System? Yes   significant other of 21 yrs, 3 grown kids     Barriers   Psychosocial barriers to participate in program Psychosocial barriers identified (see note);The patient should benefit from training in stress management and relaxation.      Screening  Interventions   Interventions Encouraged to exercise;Provide feedback about the scores to participant;To provide support and resources with identified psychosocial needs    Expected Outcomes Short Term goal: Utilizing psychosocial counselor, staff and physician to assist with identification of specific Stressors or current issues interfering with healing process. Setting desired goal for each stressor or current issue identified.;Long Term Goal: Stressors or current issues are controlled or eliminated.;Short Term goal: Identification and review with participant of any Quality of Life or Depression concerns found by scoring the questionnaire.;Long Term goal: The participant improves quality of Life and PHQ9 Scores as seen by post scores and/or verbalization of changes             Quality of Life Scores:  Quality of Life - 05/03/23 1426       Quality of Life   Select Quality of Life      Quality of Life Scores   Health/Function Pre 13.25 %    Socioeconomic Pre 11.83 %    Psych/Spiritual Pre 13.17 %    Family Pre 18.2 %    GLOBAL Pre 13.76 %            Scores of 19 and below usually indicate a poorer quality of life in these areas.  A difference of  2-3 points is a clinically meaningful difference.  A difference of 2-3 points in the total score of the Quality of Life Index has been associated with significant improvement in overall quality of life, self-image, physical symptoms, and general health in studies assessing change in quality of life.  PHQ-9: Review Flowsheet  More data exists      05/03/2023 03/23/2023 01/05/2023 12/08/2022 09/07/2022  Depression screen PHQ 2/9  Decreased Interest 0 1 1 2 1   Down, Depressed, Hopeless 2 1 1 1  0  PHQ - 2 Score 2 2 2 3 1   Altered sleeping 3 2 - 0 0  Tired, decreased energy 3 0 - 1 1  Change in appetite 0 0 - 0 0  Feeling bad or failure about yourself  0 0 - 0 0  Trouble concentrating 1 0 - 0 0  Moving slowly or fidgety/restless 1 0 - 0 0   Suicidal thoughts 0 0 - 0 0  PHQ-9 Score 10 4 - 4 2  Difficult doing work/chores Very difficult - - Somewhat difficult Somewhat difficult    Details           Interpretation of Total Score  Total Score Depression Severity:  1-4 = Minimal depression, 5-9 = Mild depression, 10-14 = Moderate depression, 15-19 = Moderately severe depression, 20-27 = Severe depression   Psychosocial Evaluation and Intervention:  Psychosocial Evaluation - 05/03/23 1429       Psychosocial Evaluation & Interventions   Interventions Stress management education;Relaxation education;Encouraged to exercise with the program and follow exercise prescription    Comments Maitri is coming into cardiac rehab after a STEMI in April.  She has a previous heart history including MI and CABG.  She was unsure why she suddenly had a heart attack and went into heart failure. She was excited to be able to come off of her LifeVest last week after her TEE showed improvement!!  She has a great support system in her partner of 21 years.  They look out for each other but he is a bad influence on her eating, but she has recently lost almost 50 lb since taking over her diet herself!!  She wants to get moving again and to feel better again.  She wants to be able to walk without having to stop and rest for her breath and chest pain.  She normally is a good sleeper, but recently they got a bout of bed bugs and she wakes in the night hunting for them.  She is hoping to be rid of them soon!  She did not have any traces of bed bugs on her today and was encouraged to continue to launder her bedding to clear them up.  She is retired and on a limited monthly income, but makes the best of it.  One of her stressors is her exhusband when she has to deal with him from time to time.  She does have some occassional depression symptoms especially since she isn't able to go and do like she would like to currently.  Her PHQ is currently a 10 and we will reassess  in a month.  Currently attributted to not sleeping well and not haveing the energy that she would like to have.  She also wants to build up her strength and stamina overall.  She has no barriers to attending rehab other than her fatigue levels.  She is eage to get started to get to feeling better.    Expected Outcomes Short: Attend rehab to build up strength and stamina Long: Continue to work on improving sleep overall.    Continue Psychosocial Services  Follow up required by staff             Psychosocial Re-Evaluation:  Psychosocial Re-Evaluation     Row Name 05/06/23 1109 05/11/23 1121           Psychosocial Re-Evaluation   Current issues with Current Stress Concerns Current Stress Concerns;Current Depression      Comments Spoke to pt about bedbugs again.  She says they have not seen any over the last two nights.  Asked her to keep [place] updated. Pt is sleeping better since she and her partner have not seen any bedbugs for a  few weeks.  She does still have some depression related to her diagnosis of heart failure, but she states that the depression has improved since she has more energy to do activities around the house.  She does worry since her father died of heart disease at an early age.  She still takes Prozac for depression, but she states that she would like to either get off it or try another anti depressant.  We spoke about talking to her primary doctor or his nurse about the Prozac.      Expected Outcomes Short: Keep watch on bedbugs Long: Completely gone Short term:  Continue to have a positive attitude and do more activities around the house           Long term:  Once she has established rapport with her new primary care MD  talk to him about Prozac      Interventions Stress management education;Encouraged to attend Cardiac Rehabilitation for the exercise Stress management education;Relaxation education;Encouraged to attend Cardiac Rehabilitation for the exercise      Continue  Psychosocial Services  Follow up required by staff Follow up required by staff        Initial Review   Source of Stress Concerns -- Chronic Illness;Family               Psychosocial Discharge (Final Psychosocial Re-Evaluation):  Psychosocial Re-Evaluation - 05/11/23 1121       Psychosocial Re-Evaluation   Current issues with Current Stress Concerns;Current Depression    Comments Pt is sleeping better since she and her partner have not seen any bedbugs for a few weeks.  She does still have some depression related to her diagnosis of heart failure, but she states that the depression has improved since she has more energy to do activities around the house.  She does worry since her father died of heart disease at an early age.  She still takes Prozac for depression, but she states that she would like to either get off it or try another anti depressant.  We spoke about talking to her primary doctor or his nurse about the Prozac.    Expected Outcomes Short term:  Continue to have a positive attitude and do more activities around the house           Long term:  Once she has established rapport with her new primary care MD  talk to him about Prozac    Interventions Stress management education;Relaxation education;Encouraged to attend Cardiac Rehabilitation for the exercise    Continue Psychosocial Services  Follow up required by staff      Initial Review   Source of Stress Concerns Chronic Illness;Family             Vocational Rehabilitation: Provide vocational rehab assistance to qualifying candidates.   Vocational Rehab Evaluation & Intervention:  Vocational Rehab - 05/03/23 1258       Initial Vocational Rehab Evaluation & Intervention   Assessment shows need for Vocational Rehabilitation No   retired            Education: Education Goals: Education classes will be provided on a weekly basis, covering required topics. Participant will state understanding/return  demonstration of topics presented.  Learning Barriers/Preferences:  Learning Barriers/Preferences - 05/03/23 1247       Learning Barriers/Preferences   Learning Barriers Hearing   R hearing aids, L deaf   Learning Preferences None             Education Topics:  Hypertension, Hypertension Reduction -Define heart disease and high blood pressure. Discus how high blood pressure affects the body and ways to reduce high blood pressure.   Exercise and Your Heart -Discuss why it is important to exercise, the FITT principles of exercise, normal and abnormal responses to exercise, and how to exercise safely.   Angina -Discuss definition of angina, causes of angina, treatment of angina, and how to decrease risk of having angina.   Cardiac Medications -Review what the following cardiac medications are used for, how they affect the body, and side effects that may occur when taking the medications.  Medications include Aspirin, Beta blockers, calcium channel blockers, ACE Inhibitors, angiotensin receptor blockers, diuretics, digoxin, and antihyperlipidemics. Flowsheet Row CARDIAC REHAB PHASE II EXERCISE from 05/18/2023 in [place] CARDIAC REHABILITATION  Date 05/04/23  Educator HB  Instruction Review Code 1- Verbalizes Understanding       Congestive Heart Failure -Discuss the definition of CHF, how to live with CHF, the signs and symptoms of CHF, and how keep track of weight and sodium intake. Flowsheet Row CARDIAC REHAB PHASE II EXERCISE from 05/18/2023 in [place] CARDIAC REHABILITATION  Date 05/11/23  Educator HB  Instruction Review Code 1- Verbalizes Understanding       Heart Disease and Intimacy -Discus the effect sexual activity has on the heart, how changes occur during intimacy as we age, and safety during sexual activity. Flowsheet Row CARDIAC REHAB PHASE II EXERCISE from 05/18/2023 in [place] CARDIAC REHABILITATION  Date 05/18/23  Educator [facility]  Instruction Review Code  1- Verbalizes Understanding       Smoking Cessation / COPD -Discuss different methods to quit smoking, the health benefits of quitting smoking, and the definition of COPD.   Nutrition I: Fats -Discuss the types of cholesterol, what cholesterol does to the heart, and how cholesterol levels can be controlled.   Nutrition II: Labels -Discuss the different components of food labels and how to read food label   Heart Parts/Heart Disease and PAD -Discuss the anatomy of the heart, the pathway of blood circulation through the heart, and these are affected by heart disease.   Stress I: Signs and Symptoms -Discuss the causes of stress, how stress may lead to anxiety and depression, and ways to limit stress.   Stress II: Relaxation -Discuss different types of relaxation techniques to limit stress.   Warning Signs of Stroke / TIA -Discuss definition of a stroke, what the signs and symptoms are of a stroke, and how to identify when someone is having stroke.   Knowledge Questionnaire Score:  Knowledge Questionnaire Score - 05/03/23 1309       Knowledge Questionnaire Score   Pre Score 22/24             Core Components/Risk Factors/Patient Goals at Admission:  Personal Goals and Risk Factors at Admission - 05/03/23 1258       Core Components/Risk Factors/Patient Goals on Admission    Weight Management Yes;Obesity;Weight Maintenance;Weight Loss    Intervention Weight Management: Develop a combined nutrition and exercise program designed to reach desired caloric intake, while maintaining appropriate intake of nutrient and fiber, sodium and fats, and appropriate energy expenditure required for the weight goal.;Weight Management: Provide education and appropriate resources to help participant work on and attain dietary goals.;Weight Management/Obesity: Establish reasonable short term and long term weight goals.;Obesity: Provide education and appropriate resources to help participant  work on and attain dietary goals.    Admit Weight 160 lb 12.8 oz (72.9 kg)  Goal Weight: Short Term 155 lb (70.3 kg)    Goal Weight: Long Term 155 lb (70.3 kg)    Expected Outcomes Short Term: Continue to assess and modify interventions until short term weight is achieved;Long Term: Adherence to nutrition and physical activity/exercise program aimed toward attainment of established weight goal;Weight Loss: Understanding of general recommendations for a balanced deficit meal plan, which promotes 1-2 lb weight loss per week and includes a negative energy balance of [phone] kcal/d;Understanding recommendations for meals to include 15-35% energy as protein, 25-35% energy from fat, 35-60% energy from carbohydrates, less than 200mg  of dietary cholesterol, 20-35 gm of total fiber daily;Understanding of distribution of calorie intake throughout the day with the consumption of 4-5 meals/snacks;Weight Maintenance: Understanding of the daily nutrition guidelines, which includes 25-35% calories from fat, 7% or less cal from saturated fats, less than 200mg  cholesterol, less than 1.5gm of sodium, & 5 or more servings of fruits and vegetables daily    Diabetes Yes    Intervention Provide education about signs/symptoms and action to take for hypo/hyperglycemia.;Provide education about proper nutrition, including hydration, and aerobic/resistive exercise prescription along with prescribed medications to achieve blood glucose in normal ranges: Fasting glucose 65-99 mg/dL    Expected Outcomes Short Term: Participant verbalizes understanding of the signs/symptoms and immediate care of hyper/hypoglycemia, proper foot care and importance of medication, aerobic/resistive exercise and nutrition plan for blood glucose control.;Long Term: Attainment of HbA1C < 7%.    Heart Failure Yes    Intervention Provide a combined exercise and nutrition program that is supplemented with education, support and counseling about heart failure.  Directed toward relieving symptoms such as shortness of breath, decreased exercise tolerance, and extremity edema.    Expected Outcomes Improve functional capacity of life;Short term: Attendance in program 2-3 days a week with increased exercise capacity. Reported lower sodium intake. Reported increased fruit and vegetable intake. Reports medication compliance.;Short term: Daily weights obtained and reported for increase. Utilizing diuretic protocols set by physician.;Long term: Adoption of self-care skills and reduction of barriers for early signs and symptoms recognition and intervention leading to self-care maintenance.    Hypertension Yes    Intervention Provide education on lifestyle modifcations including regular physical activity/exercise, weight management, moderate sodium restriction and increased consumption of fresh fruit, vegetables, and low fat dairy, alcohol moderation, and smoking cessation.;Monitor prescription use compliance.    Expected Outcomes Short Term: Continued assessment and intervention until BP is < 140/35mm HG in hypertensive participants. < 130/95mm HG in hypertensive participants with diabetes, heart failure or chronic kidney disease.;Long Term: Maintenance of blood pressure at goal levels.    Lipids Yes    Intervention Provide education and support for participant on nutrition & aerobic/resistive exercise along with prescribed medications to achieve LDL 70mg , HDL >40mg .    Expected Outcomes Short Term: Participant states understanding of desired cholesterol values and is compliant with medications prescribed. Participant is following exercise prescription and nutrition guidelines.;Long Term: Cholesterol controlled with medications as prescribed, with individualized exercise RX and with personalized nutrition plan. Value goals: LDL < 70mg , HDL > 40 mg.             Core Components/Risk Factors/Patient Goals Review:   Goals and Risk Factor Review     Row Name 05/11/23  1152             Core Components/Risk Factors/Patient Goals Review   Personal Goals Review Weight Management/Obesity;Heart Failure;Hypertension;Lipids;Diabetes       Review Pt checks her blood sugar 1-2 times  a day.  She states that she forgets a lot a night but she usually remembers to check it in the morning.  She does not check her BP or her weight at home, but she does check for swelling in her feet/legs daily.  We talked about the importance of daily weights, as well as checking her BP at home since she has a machine.  Since her partner also needs to check his BP at home we talked about them trying to check their BP together.       Expected Outcomes Short term:  Pt will start checking her weight daily and also checking her BP at home.            Long term:  Better understanding of heart failure                Core Components/Risk Factors/Patient Goals at Discharge (Final Review):   Goals and Risk Factor Review - 05/11/23 1152       Core Components/Risk Factors/Patient Goals Review   Personal Goals Review Weight Management/Obesity;Heart Failure;Hypertension;Lipids;Diabetes    Review Pt checks her blood sugar 1-2 times a day.  She states that she forgets a lot a night but she usually remembers to check it in the morning.  She does not check her BP or her weight at home, but she does check for swelling in her feet/legs daily.  We talked about the importance of daily weights, as well as checking her BP at home since she has a machine.  Since her partner also needs to check his BP at home we talked about them trying to check their BP together.    Expected Outcomes Short term:  Pt will start checking her weight daily and also checking her BP at home.            Long term:  Better understanding of heart failure             ITP Comments:  ITP Comments     Row Name 05/03/23 1418 05/04/23 1125 05/25/23 0828       ITP Comments Patient attend orientation today.  Patient is  attendingCardiac Rehabilitation Program.  Documentation for diagnosis can be found in [facility] encounter 01/22/23.  Reviewed medical chart, RPE/RPD, gym safety, and program guidelines.  Patient was fitted to equipment they will be using during rehab.  Patient is scheduled to start exercise on 05/04/23.   Initial ITP created and sent for review and signature by Dr. Dina Rich, Medical Director for Cardiac Rehabilitation Program. First full day of exercise!  Patient was oriented to gym and equipment including functions, settings, policies, and procedures.  Patient's individual exercise prescription and treatment plan were reviewed.  All starting workloads were established based on the results of the 6 minute walk test done at initial orientation visit.  The plan for exercise progression was also introduced and progression will be customized based on patient's performance and goals 30 day review completed. ITP sent to Dr. Dina Rich, Medical Director of Cardiac Rehab. Continue with ITP unless changes are made by physician.              Comments: 30 day review

## 2023-05-27 ENCOUNTER — Encounter (HOSPITAL_COMMUNITY): Payer: Medicare HMO

## 2023-05-30 ENCOUNTER — Encounter (HOSPITAL_COMMUNITY): Payer: Medicare HMO

## 2023-05-30 ENCOUNTER — Encounter (HOSPITAL_COMMUNITY): Admission: RE | Admit: 2023-05-30 | Payer: Medicare HMO | Source: Ambulatory Visit

## 2023-05-30 DIAGNOSIS — I2129 ST elevation (STEMI) myocardial infarction involving other sites: Secondary | ICD-10-CM

## 2023-05-30 LAB — GLUCOSE, CAPILLARY: Glucose-Capillary: 215 mg/dL — ABNORMAL HIGH (ref 70–99)

## 2023-05-30 NOTE — Progress Notes (Signed)
Daily Session Note  Patient Details  Name: Angela Rose MRN: [id number] Date of Birth: [birth year] Referring Provider:   Flowsheet Row CARDIAC REHAB PHASE II ORIENTATION from 05/03/2023 in [facility] CARDIAC REHABILITATION  Referring Provider Angela Lemma MD  [Attending Cardiologist: Dr. Carolan Rose       Encounter Date: 05/30/2023  Check In:  Session Check In - 05/30/23 1045       Check-In   Supervising physician immediately available to respond to emergencies See telemetry face sheet for immediately available ER MD    Location [facility]    Staff Present Rodena Medin, RN, Pleas Koch, RN, BSN    Virtual Visit No    Medication changes reported     No    Fall or balance concerns reported    No    Warm-up and Cool-down Performed on first and last piece of equipment    Resistance Training Performed Yes    VAD Patient? No    PAD/SET Patient? No      Pain Assessment   Currently in Pain? No/denies    Multiple Pain Sites No             Capillary Blood Glucose: No results found for this or any previous visit (from the past 24 hour(s)).    Social History   Tobacco Use  Smoking Status Former   Current packs/day: 0.00   Types: Cigarettes   Quit date: 06/11/1982   Years since quitting: 40.9  Smokeless Tobacco Never  Tobacco Comments   smoking cessation materials not required    Goals Met:  Independence with exercise equipment Exercise tolerated well No report of concerns or symptoms today Strength training completed today  Goals Unmet:  Not Applicable  Comments: Pt able to follow exercise prescription today without complaint.  Will continue to monitor for progression.    Dr. Dina Rose is Medical Director for [facility] Cardiac Rehab

## 2023-06-01 ENCOUNTER — Encounter (HOSPITAL_COMMUNITY): Payer: Medicare HMO

## 2023-06-03 ENCOUNTER — Encounter (HOSPITAL_COMMUNITY)
Admission: RE | Admit: 2023-06-03 | Discharge: 2023-06-03 | Disposition: A | Discharge status: Home / Self Care | Payer: Medicare HMO | Source: Ambulatory Visit | Attending: Cardiology | Admitting: Cardiology

## 2023-06-03 ENCOUNTER — Encounter (HOSPITAL_COMMUNITY): Payer: Medicare HMO

## 2023-06-03 DIAGNOSIS — I2129 ST elevation (STEMI) myocardial infarction involving other sites: Secondary | ICD-10-CM | POA: Diagnosis not present

## 2023-06-03 NOTE — Progress Notes (Signed)
Daily Session Note  Patient Details  Name: Atiya Iliff MRN: [id number] Date of Birth: Nov 26, 1945 Referring Provider:   Flowsheet Row CARDIAC REHAB PHASE II ORIENTATION from 05/03/2023 in [facility] CARDIAC REHABILITATION  Referring Provider [name] MD  [Attending Cardiologist: Dr. Carolan Clines       Encounter Date: 06/03/2023  Check In:  Session Check In - 06/03/23 1045       Check-In   Supervising physician immediately available to respond to emergencies See telemetry face sheet for immediately available ER MD    Location [facility]    Staff Present Rodena Medin, RN, BSN;Heather Fredric Mare, BS, Exercise Physiologist    Virtual Visit No    Medication changes reported     No    Fall or balance concerns reported    No    Warm-up and Cool-down Performed on first and last piece of equipment    Resistance Training Performed Yes    VAD Patient? No    PAD/SET Patient? No      Pain Assessment   Currently in Pain? No/denies    Multiple Pain Sites No             Capillary Blood Glucose: No results found for this or any previous visit (from the past 24 hour(s)).    Social History   Tobacco Use  Smoking Status Former   Current packs/day: 0.00   Types: Cigarettes   Quit date: 06/11/1982   Years since quitting: 41.0  Smokeless Tobacco Never  Tobacco Comments   smoking cessation materials not required    Goals Met:  Independence with exercise equipment Exercise tolerated well No report of concerns or symptoms today Strength training completed today  Goals Unmet:  Not Applicable  Comments: Pt able to follow exercise prescription today without complaint.  Will continue to monitor for progression..    Dr. Dina Rich is Medical Director for [facility] Cardiac Rehab

## 2023-06-06 ENCOUNTER — Encounter (HOSPITAL_COMMUNITY): Payer: Medicare HMO

## 2023-06-06 ENCOUNTER — Encounter (HOSPITAL_COMMUNITY)
Admission: RE | Admit: 2023-06-06 | Discharge: 2023-06-06 | Disposition: A | Discharge status: Home / Self Care | Payer: Medicare HMO | Source: Ambulatory Visit | Attending: Cardiology | Admitting: Cardiology

## 2023-06-06 DIAGNOSIS — I2129 ST elevation (STEMI) myocardial infarction involving other sites: Secondary | ICD-10-CM

## 2023-06-06 NOTE — Progress Notes (Signed)
Daily Session Note  Patient Details  Name: Angela Rose MRN: [id number] Date of Birth: [birth year] Referring Provider:   Flowsheet Row CARDIAC REHAB PHASE II ORIENTATION from 05/03/2023 in [facility] CARDIAC REHABILITATION  Referring Provider [name] MD  [Attending Cardiologist: Dr. Carolan Clines       Encounter Date: 06/06/2023  Check In:  Session Check In - 06/06/23 1045       Check-In   Supervising physician immediately available to respond to emergencies See telemetry face sheet for immediately available ER MD    Location [facility]    Staff Present Rodena Medin, RN, BSN;Jessica Juanetta Gosling, MA, RCEP, CCRP, CCET;Heather Fredric Mare, [place], Exercise Physiologist    Virtual Visit No    Medication changes reported     No    Fall or balance concerns reported    No    Warm-up and Cool-down Performed on first and last piece of equipment    Resistance Training Performed Yes    VAD Patient? No    PAD/SET Patient? No      Pain Assessment   Currently in Pain? No/denies    Multiple Pain Sites No             Capillary Blood Glucose: No results found for this or any previous visit (from the past 24 hour(s)).    Social History   Tobacco Use  Smoking Status Former   Current packs/day: 0.00   Types: Cigarettes   Quit date: 06/11/1982   Years since quitting: 41.0  Smokeless Tobacco Never  Tobacco Comments   smoking cessation materials not required    Goals Met:  Independence with exercise equipment Exercise tolerated well No report of concerns or symptoms today Strength training completed today  Goals Unmet:  Not Applicable  Comments: Pt able to follow exercise prescription today without complaint.  Will continue to monitor for progression.    Dr. Dina Rich is Medical Director for [facility] Cardiac Rehab

## 2023-06-08 ENCOUNTER — Encounter (HOSPITAL_COMMUNITY)
Admission: RE | Admit: 2023-06-08 | Discharge: 2023-06-08 | Disposition: A | Discharge status: Home / Self Care | Payer: Medicare HMO | Source: Ambulatory Visit | Attending: Cardiology | Admitting: Cardiology

## 2023-06-08 ENCOUNTER — Encounter (HOSPITAL_COMMUNITY): Payer: Medicare HMO

## 2023-06-08 DIAGNOSIS — I2129 ST elevation (STEMI) myocardial infarction involving other sites: Secondary | ICD-10-CM

## 2023-06-08 NOTE — Progress Notes (Signed)
Daily Session Note  Patient Details  Name: Angela Rose MRN: [id number] Date of Birth: 07-Sep-1946 Referring Provider:   Flowsheet Row CARDIAC REHAB PHASE II ORIENTATION from 05/03/2023 in [facility] CARDIAC REHABILITATION  Referring Provider [name] MD  [Attending Cardiologist: Dr. Carolan Clines       Encounter Date: 06/08/2023  Check In:  Session Check In - 06/08/23 1030       Check-In   Supervising physician immediately available to respond to emergencies See telemetry face sheet for immediately available MD    Location [facility]    Staff Present Ross Ludwig, BS, Exercise Physiologist;Aahan Marques [address] BSN, RN;Jessica Huson, MA, RCEP, CCRP, Dow Adolph, RN, BSN    Virtual Visit No    Medication changes reported     No    Fall or balance concerns reported    No    Tobacco Cessation No Change    Warm-up and Cool-down Performed on first and last piece of equipment    Resistance Training Performed Yes    VAD Patient? No    PAD/SET Patient? No      Pain Assessment   Currently in Pain? No/denies    Multiple Pain Sites No             Capillary Blood Glucose: No results found for this or any previous visit (from the past 24 hour(s)).    Social History   Tobacco Use  Smoking Status Former   Current packs/day: 0.00   Types: Cigarettes   Quit date: 06/11/1982   Years since quitting: 41.0  Smokeless Tobacco Never  Tobacco Comments   smoking cessation materials not required    Goals Met:  Independence with exercise equipment Exercise tolerated well No report of concerns or symptoms today Strength training completed today  Goals Unmet:  Not Applicable  Comments: Marland KitchenMarland KitchenPt able to follow exercise prescription today without complaint.  Will continue to monitor for progression.    Dr. Dina Rich is Medical Director for [facility] Cardiac Rehab

## 2023-06-10 ENCOUNTER — Encounter (HOSPITAL_COMMUNITY): Payer: Medicare HMO

## 2023-06-10 ENCOUNTER — Encounter (HOSPITAL_COMMUNITY)
Admission: RE | Admit: 2023-06-10 | Discharge: 2023-06-10 | Disposition: A | Discharge status: Home / Self Care | Payer: Medicare HMO | Source: Ambulatory Visit | Attending: Cardiology | Admitting: Cardiology

## 2023-06-10 DIAGNOSIS — I2129 ST elevation (STEMI) myocardial infarction involving other sites: Secondary | ICD-10-CM

## 2023-06-10 NOTE — Progress Notes (Signed)
Daily Session Note  Patient Details  Name: Angela Rose MRN: [id number] Date of Birth: Jan 13, 1946 Referring Provider:   Flowsheet Row CARDIAC REHAB PHASE II ORIENTATION from 05/03/2023 in [facility] CARDIAC REHABILITATION  Referring Provider [name] MD  [Attending Cardiologist: Dr. Carolan Clines       Encounter Date: 06/10/2023  Check In:  Session Check In - 06/10/23 1100       Check-In   Supervising physician immediately available to respond to emergencies See telemetry face sheet for immediately available MD    Location [facility]-Card[facility] Re[facility]    Staff Present Ross Ludwig, BS, Exercise Physiologist;Jessica Juanetta Gosling, MA, RCEP, CCRP, CCET    Staff Present Kary Kos, MS, Exercise Physiologist    Virtual Visit No    Medication changes reported     No    Fall or balance concerns reported    No    Tobacco Cessation No Change    Warm-up and Cool-down Performed on first and last piece of equipment    Resistance Training Performed Yes    VAD Patient? No    PAD/SET Patient? No      Pain Assessment   Currently in Pain? No/denies    Multiple Pain Sites No             Capillary Blood Glucose: No results found for this or any previous visit (from the past 24 hour(s)).    Social History   Tobacco Use  Smoking Status Former   Current packs/day: 0.00   Types: Cigarettes   Quit date: 06/11/1982   Years since quitting: 41.0  Smokeless Tobacco Never  Tobacco Comments   smoking cessation materials not required    Goals Met:  Independence with exercise equipment Exercise tolerated well No report of concerns or symptoms today Strength training completed today  Goals Unmet:  Not Applicable  Comments: Pt able to follow exercise prescription today without complaint.  Will continue to monitor for progression.    Dr. Dina Rich is Medical Director for [facility] Cardiac Rehab

## 2023-06-13 ENCOUNTER — Encounter (HOSPITAL_COMMUNITY): Payer: Medicare HMO

## 2023-06-14 ENCOUNTER — Encounter: Payer: Self-pay | Admitting: Internal Medicine

## 2023-06-14 ENCOUNTER — Other Ambulatory Visit: Payer: Self-pay | Admitting: Internal Medicine

## 2023-06-14 ENCOUNTER — Ambulatory Visit (INDEPENDENT_AMBULATORY_CARE_PROVIDER_SITE_OTHER): Payer: Medicare HMO | Admitting: Internal Medicine

## 2023-06-14 VITALS — BP 124/68 | HR 63 | Ht 66.0 in | Wt 162.2 lb

## 2023-06-14 DIAGNOSIS — Z85820 Personal history of malignant melanoma of skin: Secondary | ICD-10-CM

## 2023-06-14 DIAGNOSIS — Z23 Encounter for immunization: Secondary | ICD-10-CM | POA: Diagnosis not present

## 2023-06-14 DIAGNOSIS — I5043 Acute on chronic combined systolic (congestive) and diastolic (congestive) heart failure: Secondary | ICD-10-CM | POA: Diagnosis not present

## 2023-06-14 DIAGNOSIS — N3001 Acute cystitis with hematuria: Secondary | ICD-10-CM

## 2023-06-14 DIAGNOSIS — R300 Dysuria: Secondary | ICD-10-CM

## 2023-06-14 DIAGNOSIS — N3090 Cystitis, unspecified without hematuria: Secondary | ICD-10-CM | POA: Insufficient documentation

## 2023-06-14 DIAGNOSIS — E118 Type 2 diabetes mellitus with unspecified complications: Secondary | ICD-10-CM

## 2023-06-14 MED ORDER — CEPHALEXIN 500 MG PO CAPS
500.0000 mg | ORAL_CAPSULE | Freq: Two times a day (BID) | ORAL | 0 refills | Status: AC
Start: 2023-06-14 — End: 2023-06-19

## 2023-06-14 NOTE — Assessment & Plan Note (Signed)
Left cheek.  S/p Mohs (2013).  Dermatology referral requested today.

## 2023-06-14 NOTE — Assessment & Plan Note (Signed)
EF improving, 40-45% on latest TTE.  She remains euvolemic on exam today and asymptomatic.  On appropriate GDMT.  Participating in cardiac rehab.

## 2023-06-14 NOTE — Assessment & Plan Note (Signed)
Followed by endocrinology.  Recently seen for follow-up.  A1c improving, 7.8 most recently.  No medication changes are indicated today.  Ophthalmology referral placed.

## 2023-06-14 NOTE — Assessment & Plan Note (Signed)
Her acute concern today is dysuria.  Largely denies additional symptoms.  POC UA today significant for large LE and hematuria. -Keflex 500 mg twice daily x 5 days prescribed for treatment of UTI -Follow-up urine culture

## 2023-06-14 NOTE — Patient Instructions (Signed)
It was a pleasure to see you today.  Thank you for giving [place] the opportunity to be involved in your care.  Below is a brief recap of your visit and next steps.  We will plan to see you again in 6 months.  Summary No medications changes today We will plan for follow up in 6 months

## 2023-06-14 NOTE — Progress Notes (Signed)
Established Patient Office Visit  Subjective   Patient ID: Angela Rose, female    DOB: 1945/12/05  Age: 77 y.o. MRN: [id number]  Chief Complaint  Patient presents with   Diabetes    Follow up    Urinary Tract Infection    Patient feels she may have a uti, with urinary frequency and uncomfortable feeling    Angela Rose returns to care today for routine follow-up.  She was last evaluated by me on 6/12.  No medication changes were made at that time and 31-month follow-up was arranged.  In the interim, she has been evaluated by podiatry and endocrinology for follow-up.  She is also participating in cardiac rehab.  Angela Rose reports feeling fairly well today.  Her acute concern is recent dysuria.  This occurs at the end of urinary stream.  She is concerned for UTI.  Denies fever/chills, lower abdominal pain, nausea/vomiting, hematuria, and abnormal color/odor of urine.  Past Medical History:  Diagnosis Date   Anxiety 07/10/2014   Cancer (HCC) 12/13/2011   melanoma face   CHF (congestive heart failure), NYHA class II, acute on chronic, combined (HCC) 01/14/2023   Chronic kidney disease (CKD), stage III (moderate) (HCC) 02/17/2022   Chronic sinusitis 07/02/2022   Depression 07/10/2014   Diabetes mellitus without complication (HCC) 08/21/2014   Fracture of tibial plateau 12/31/2015   Hyperlipidemia 08/14/2015   Hypertension 09/03/2014   Hypothyroidism, postablative 02/08/2014   Myocardial infarction [facility] 1993   Tick bites 10/29/2014   Vitamin D deficiency 12/08/2022   Past Surgical History:  Procedure Laterality Date   ABDOMINAL HYSTERECTOMY     APPENDECTOMY     BASAL CELL CARCINOMA EXCISION     BREAST BIOPSY Right    needle bx-neg   CATARACT EXTRACTION, BILATERAL Bilateral 11/23/2018   CORONARY ARTERY BYPASS GRAFT  1996   x 3   CORONARY/GRAFT ACUTE MI REVASCULARIZATION N/A 01/13/2023   Procedure: Coronary/Graft Acute MI Revascularization;  Surgeon: Marykay Lex, MD;  Location: [facility] INVASIVE CV LAB;  Service: Cardiovascular;  Laterality: N/A;   LEFT HEART CATH AND CORONARY ANGIOGRAPHY N/A 01/13/2023   Procedure: LEFT HEART CATH AND CORONARY ANGIOGRAPHY;  Surgeon: Marykay Lex, MD;  Location: [facility] INVASIVE CV LAB;  Service: Cardiovascular;  Laterality: N/A;   MELANOMA EXCISION     PARTIAL HYSTERECTOMY  1976   ovaries remain   Social History   Tobacco Use   Smoking status: Former    Current packs/day: 0.00    Types: Cigarettes    Quit date: 06/11/1982    Years since quitting: 41.0   Smokeless tobacco: Never   Tobacco comments:    smoking cessation materials not required  Vaping Use   Vaping status: Never Used  Substance Use Topics   Alcohol use: Yes    Alcohol/week: 0.0 standard drinks of alcohol    Comment: rare 1 wine glass of wine   Drug use: No   Family History  Problem Relation Age of Onset   Skin cancer Mother    Squamous cell carcinoma Mother    Basal cell carcinoma Mother    Heart disease Father    CAD Father    Breast cancer Neg Hx    Allergies  Allergen Reactions   Propoxyphene Nausea And Vomiting    Other reaction(s): Vomiting   Lidocaine-Epinephrine     Other reaction(s): Other (See Comments) palpitatins and chest pain   Other Other (See Comments)    sutures - in her hand-very irritated  Tape Rash    BANDAIDS. BANDAIDS   Xylocaine  [Lidocaine Hcl]     Other reaction(s): Other (See Comments) palpitatins and chest pain   Statins     Muscle pain and weakness in legs   Jardiance [Empagliflozin] Other (See Comments)    Recurrent vaginal infections   Review of Systems  Constitutional:  Negative for chills and fever.  HENT:  Negative for sore throat.   Respiratory:  Negative for cough and shortness of breath.   Cardiovascular:  Negative for chest pain, palpitations and leg swelling.  Gastrointestinal:  Negative for abdominal pain, blood in stool, constipation, diarrhea, nausea and vomiting.  Genitourinary:   Positive for dysuria. Negative for flank pain, frequency, hematuria and urgency.  Musculoskeletal:  Negative for myalgias.  Skin:  Negative for itching and rash.  Neurological:  Negative for dizziness and headaches.  Psychiatric/Behavioral:  Negative for depression and suicidal ideas.      Objective:     BP 124/68   Pulse 63   Ht 5\' 6"  (1.676 m)   Wt 162 lb 3.2 oz (73.6 kg)   SpO2 98%   BMI 26.18 kg/m  BP Readings from Last 3 Encounters:  06/14/23 124/68  05/02/23 102/64  04/29/23 122/68   Physical Exam Vitals reviewed.  Constitutional:      General: She is not in acute distress.    Appearance: Normal appearance. She is not toxic-appearing.  HENT:     Head: Normocephalic and atraumatic.     Right Ear: External ear normal.     Left Ear: External ear normal.     Nose: Nose normal. No congestion or rhinorrhea.     Mouth/Throat:     Mouth: Mucous membranes are moist.     Pharynx: Oropharynx is clear. No oropharyngeal exudate or posterior oropharyngeal erythema.  Eyes:     General: No scleral icterus.    Extraocular Movements: Extraocular movements intact.     Conjunctiva/sclera: Conjunctivae normal.     Pupils: Pupils are equal, round, and reactive to light.  Cardiovascular:     Rate and Rhythm: Normal rate and regular rhythm.     Pulses: Normal pulses.     Heart sounds: Normal heart sounds. No murmur heard.    No friction rub. No gallop.  Pulmonary:     Effort: Pulmonary effort is normal.     Breath sounds: Normal breath sounds. No wheezing, rhonchi or rales.  Abdominal:     General: Abdomen is flat. Bowel sounds are normal. There is no distension.     Palpations: Abdomen is soft.     Tenderness: There is no abdominal tenderness.  Musculoskeletal:        General: No swelling. Normal range of motion.     Cervical back: Normal range of motion.     Right lower leg: No edema.     Left lower leg: No edema.  Lymphadenopathy:     Cervical: No cervical adenopathy.   Skin:    General: Skin is warm and dry.     Capillary Refill: Capillary refill takes less than 2 seconds.     Coloration: Skin is not jaundiced.  Neurological:     General: No focal deficit present.     Mental Status: She is alert and oriented to person, place, and time.  Psychiatric:        Mood and Affect: Mood normal.        Behavior: Behavior normal.   Last CBC Lab Results  Component Value Date  WBC 7.9 01/15/2023   HGB 10.8 (L) 01/15/2023   HCT 33.0 (L) 01/15/2023   MCV 88.7 01/15/2023   MCH 29.0 01/15/2023   RDW 13.6 01/15/2023   PLT 242 01/15/2023   Last metabolic panel Lab Results  Component Value Date   GLUCOSE 98 03/23/2023   NA 139 03/23/2023   K 5.1 03/23/2023   CL 101 03/23/2023   CO2 22 03/23/2023   BUN 31 (H) 03/23/2023   CREATININE 1.44 (H) 03/23/2023   EGFR 37 (L) 03/23/2023   CALCIUM 9.4 03/23/2023   PROT 6.8 01/13/2023   ALBUMIN 3.5 01/13/2023   LABGLOB 2.7 12/27/2022   AGRATIO 1.6 12/27/2022   BILITOT 0.4 01/13/2023   ALKPHOS 95 01/13/2023   AST 46 (H) 01/13/2023   ALT 19 01/13/2023   ANIONGAP 6 01/16/2023   Last lipids Lab Results  Component Value Date   CHOL 242 (H) 01/12/2023   HDL 57 01/12/2023   LDLCALC 133 (H) 01/12/2023   TRIG 261 (H) 01/12/2023   CHOLHDL 4.2 01/12/2023   Last hemoglobin A1c Lab Results  Component Value Date   HGBA1C 7.8 (A) 05/02/2023   Last thyroid functions Lab Results  Component Value Date   TSH 2.160 12/27/2022   T4TOTAL 8.2 02/16/2016   Last vitamin D Lab Results  Component Value Date   VD25OH 21.6 (L) 12/06/2022     Assessment & Plan:   Problem List Items Addressed This Visit       CHF (congestive heart failure), NYHA class II, acute on chronic, combined (HCC)    EF improving, 40-45% on latest TTE.  She remains euvolemic on exam today and asymptomatic.  On appropriate GDMT.  Participating in cardiac rehab.      Type II diabetes mellitus with complication (HCC) (Chronic)    Followed by  endocrinology.  Recently seen for follow-up.  A1c improving, 7.8 most recently.  No medication changes are indicated today.  Ophthalmology referral placed.      Acute cystitis with hematuria    Her acute concern today is dysuria.  Largely denies additional symptoms.  POC UA today significant for large LE and hematuria. -Keflex 500 mg twice daily x 5 days prescribed for treatment of UTI -Follow-up urine culture      History of melanoma - Primary    Left cheek.  S/p Mohs (2013).  Dermatology referral requested today.      Need for influenza vaccination    Influenza vaccine administered today      Return in about 6 months (around 12/12/2023).   Billie Lade, MD

## 2023-06-14 NOTE — Assessment & Plan Note (Signed)
Influenza vaccine administered today.

## 2023-06-15 ENCOUNTER — Encounter (HOSPITAL_COMMUNITY)
Admission: RE | Admit: 2023-06-15 | Discharge: 2023-06-15 | Disposition: A | Discharge status: Home / Self Care | Payer: Medicare HMO | Source: Ambulatory Visit | Attending: Cardiology | Admitting: Cardiology

## 2023-06-15 ENCOUNTER — Encounter (HOSPITAL_COMMUNITY): Payer: Medicare HMO

## 2023-06-15 DIAGNOSIS — I2129 ST elevation (STEMI) myocardial infarction involving other sites: Secondary | ICD-10-CM | POA: Insufficient documentation

## 2023-06-15 LAB — URINALYSIS
Bilirubin, UA: NEGATIVE
Ketones, UA: NEGATIVE
Nitrite, UA: NEGATIVE
Specific Gravity, UA: 1.015 (ref 1.005–1.030)
Urobilinogen, Ur: 0.2 mg/dL (ref 0.2–1.0)
pH, UA: 6.5 (ref 5.0–7.5)

## 2023-06-15 NOTE — Progress Notes (Signed)
Daily Session Note  Patient Details  Name: Angela Rose MRN: [id number] Date of Birth: Feb 10, 1946 Referring Provider:   Flowsheet Row CARDIAC REHAB PHASE II ORIENTATION from 05/03/2023 in [facility] CARDIAC REHABILITATION  Referring Provider [name] MD  [Attending Cardiologist: Dr. Carolan Clines       Encounter Date: 06/15/2023  Check In:   Capillary Blood Glucose: No results found for this or any previous visit (from the past 24 hour(s)).    Social History   Tobacco Use  Smoking Status Former   Current packs/day: 0.00   Types: Cigarettes   Quit date: 06/11/1982   Years since quitting: 41.0  Smokeless Tobacco Never  Tobacco Comments   smoking cessation materials not required    Goals Met:  Independence with exercise equipment Exercise tolerated well No report of concerns or symptoms today Strength training completed today  Goals Unmet:  Not Applicable  Comments: Pt able to follow exercise prescription today without complaint.  Will continue to monitor for progression.    Dr. Dina Rich is Medical Director for [facility] Cardiac Rehab

## 2023-06-15 NOTE — Progress Notes (Signed)
Daily Session Note  Patient Details  Name: Angela Rose MRN: [id number] Date of Birth: 1946/08/09 Referring Provider:   Flowsheet Row CARDIAC REHAB PHASE II ORIENTATION from 05/03/2023 in [facility] CARDIAC REHABILITATION  Referring Provider [name] MD  [Attending Cardiologist: Dr. Carolan Clines       Encounter Date: 06/15/2023  Check In:  Session Check In - 06/15/23 1153       Check-In   Supervising physician immediately available to respond to emergencies See telemetry face sheet for immediately available MD    Location [facility]    Staff Present Ross Ludwig, BS, Exercise Physiologist;Korver Graybeal Juanetta Gosling, MA, RCEP, CCRP, Dow Adolph, RN, BSN    Virtual Visit No    Medication changes reported     No    Fall or balance concerns reported    No    Warm-up and Cool-down Performed on first and last piece of equipment    Resistance Training Performed Yes    VAD Patient? No    PAD/SET Patient? No      Pain Assessment   Currently in Pain? No/denies             Capillary Blood Glucose: No results found for this or any previous visit (from the past 24 hour(s)).    Social History   Tobacco Use  Smoking Status Former   Current packs/day: 0.00   Types: Cigarettes   Quit date: 06/11/1982   Years since quitting: 41.0  Smokeless Tobacco Never  Tobacco Comments   smoking cessation materials not required    Goals Met:  Independence with exercise equipment Exercise tolerated well No report of concerns or symptoms today Strength training completed today  Goals Unmet:  Not Applicable  Comments: Pt able to follow exercise prescription today without complaint.  Will continue to monitor for progression.    Dr. Dina Rich is Medical Director for [facility] Cardiac Rehab

## 2023-06-17 ENCOUNTER — Encounter (HOSPITAL_COMMUNITY)
Admission: RE | Admit: 2023-06-17 | Discharge: 2023-06-17 | Disposition: A | Discharge status: Home / Self Care | Payer: Medicare HMO | Source: Ambulatory Visit | Attending: Cardiology

## 2023-06-17 ENCOUNTER — Encounter (HOSPITAL_COMMUNITY): Payer: Medicare HMO

## 2023-06-17 DIAGNOSIS — I2129 ST elevation (STEMI) myocardial infarction involving other sites: Secondary | ICD-10-CM

## 2023-06-17 NOTE — Progress Notes (Signed)
Daily Session Note  Patient Details  Name: Merceda Riesgo MRN: [id number] Date of Birth: 1946/03/30 Referring Provider:   Flowsheet Row CARDIAC REHAB PHASE II ORIENTATION from 05/03/2023 in [facility] CARDIAC REHABILITATION  Referring Provider [name] MD  [Attending Cardiologist: Dr. Carolan Clines       Encounter Date: 06/17/2023  Check In:  Session Check In - 06/17/23 1045       Check-In   Supervising physician immediately available to respond to emergencies See telemetry face sheet for immediately available ER MD    Location [facility]    Staff Present Rodena Medin, RN, Pleas Koch, RN, BSN;Jessica Juanetta Gosling, MA, RCEP, CCRP, CCET    Staff Present Lorin Picket, MS, ACSM-CEP, CCRP, Exercise Physiologist    Virtual Visit No    Medication changes reported     No    Fall or balance concerns reported    No    Warm-up and Cool-down Performed on first and last piece of equipment    Resistance Training Performed Yes    VAD Patient? No    PAD/SET Patient? No      Pain Assessment   Currently in Pain? No/denies    Multiple Pain Sites No             Capillary Blood Glucose: No results found for this or any previous visit (from the past 24 hour(s)).    Social History   Tobacco Use  Smoking Status Former   Current packs/day: 0.00   Types: Cigarettes   Quit date: 06/11/1982   Years since quitting: 41.0  Smokeless Tobacco Never  Tobacco Comments   smoking cessation materials not required    Goals Met:  Independence with exercise equipment Exercise tolerated well No report of concerns or symptoms today Strength training completed today  Goals Unmet:  Not Applicable  Comments: Pt able to follow exercise prescription today without complaint.  Will continue to monitor for progression.    Dr. Dina Rich is Medical Director for [facility] Cardiac Rehab

## 2023-06-20 ENCOUNTER — Encounter (HOSPITAL_COMMUNITY)
Admission: RE | Admit: 2023-06-20 | Discharge: 2023-06-20 | Disposition: A | Discharge status: Home / Self Care | Payer: Medicare HMO | Source: Ambulatory Visit | Attending: Cardiology

## 2023-06-20 ENCOUNTER — Encounter (HOSPITAL_COMMUNITY): Payer: Medicare HMO

## 2023-06-20 DIAGNOSIS — I2129 ST elevation (STEMI) myocardial infarction involving other sites: Secondary | ICD-10-CM | POA: Diagnosis not present

## 2023-06-20 NOTE — Progress Notes (Signed)
Daily Session Note  Patient Details  Name: Angela Rose MRN: [id number] Date of Birth: 02/02/1946 Referring Provider:   Flowsheet Row CARDIAC REHAB PHASE II ORIENTATION from 05/03/2023 in [facility] CARDIAC REHABILITATION  Referring Provider [name] MD  [Attending Cardiologist: Dr. Carolan Clines       Encounter Date: 06/20/2023  Check In:  Session Check In - 06/20/23 1100       Check-In   Supervising physician immediately available to respond to emergencies See telemetry face sheet for immediately available MD    Location [facility]    Staff Present Ross Ludwig, BS, Exercise Physiologist;Burgundy Matuszak, Margarite Gouge, RN, Thomos Lemons, MA, RCEP, CCRP, CCET    Medication changes reported     No    Fall or balance concerns reported    No    Tobacco Cessation No Change    Warm-up and Cool-down Performed on first and last piece of equipment    Resistance Training Performed Yes    VAD Patient? No    PAD/SET Patient? No      Pain Assessment   Currently in Pain? No/denies    Multiple Pain Sites No             Capillary Blood Glucose: No results found for this or any previous visit (from the past 24 hour(s)).    Social History   Tobacco Use  Smoking Status Former   Current packs/day: 0.00   Types: Cigarettes   Quit date: 06/11/1982   Years since quitting: 41.0  Smokeless Tobacco Never  Tobacco Comments   smoking cessation materials not required    Goals Met:  Independence with exercise equipment Exercise tolerated well No report of concerns or symptoms today  Goals Unmet:  Not Applicable  Comments: Pt able to follow exercise prescription today without complaint.  Will continue to monitor for progression.    Dr. Dina Rich is Medical Director for [facility] Cardiac Rehab

## 2023-06-22 ENCOUNTER — Encounter (HOSPITAL_COMMUNITY): Payer: Self-pay | Admitting: *Deleted

## 2023-06-22 ENCOUNTER — Encounter (HOSPITAL_COMMUNITY): Payer: Medicare HMO

## 2023-06-22 ENCOUNTER — Encounter (HOSPITAL_COMMUNITY)
Admission: RE | Admit: 2023-06-22 | Discharge: 2023-06-22 | Disposition: A | Discharge status: Home / Self Care | Payer: Medicare HMO | Source: Ambulatory Visit | Attending: Cardiology

## 2023-06-22 DIAGNOSIS — I2129 ST elevation (STEMI) myocardial infarction involving other sites: Secondary | ICD-10-CM | POA: Diagnosis not present

## 2023-06-22 NOTE — Progress Notes (Signed)
Cardiac Individual Treatment Plan  Patient Details  Name: Angela Rose MRN: [id number] Date of Birth: Mar 08, 1946 Referring Provider:   Flowsheet Row CARDIAC REHAB PHASE II ORIENTATION from 05/03/2023 in [facility] CARDIAC REHABILITATION  Referring Provider [name] MD  [Attending Cardiologist: Dr. Corrie Dandy Branch]       Initial Encounter Date:  Flowsheet Row CARDIAC REHAB PHASE II ORIENTATION from 05/03/2023 in [place] CARDIAC REHABILITATION  Date 05/03/23       Visit Diagnosis: ST elevation myocardial infarction (STEMI) involving other coronary artery (HCC)  Patient's Home Medications on Admission:  Current Outpatient Medications:    aspirin 81 MG tablet, Take by mouth every evening. , Disp: , Rfl:    azelastine (ASTELIN) 0.1 % nasal spray, Place 1 spray into both nostrils 2 (two) times daily. Use in each nostril as directed, Disp: 30 mL, Rfl: 1   b complex vitamins capsule, Take 1 capsule by mouth daily., Disp: , Rfl:    cetirizine (ZYRTEC) 5 MG tablet, Take 1 tablet (5 mg total) by mouth daily. (Patient not taking: Reported on 04/19/2023), Disp: 30 tablet, Rfl: 0   clopidogrel (PLAVIX) 75 MG tablet, Take 1 tablet (75 mg total) by mouth daily., Disp: 90 tablet, Rfl: 3   dapagliflozin propanediol (FARXIGA) 10 MG TABS tablet, Take 1 tablet (10 mg total) by mouth daily., Disp: 30 tablet, Rfl: 11   Evolocumab (REPATHA) 140 MG/ML SOSY, Inject 140 mg into the skin every 14 (fourteen) days., Disp: 2.1 mL, Rfl: 3   ezetimibe (ZETIA) 10 MG tablet, Take 1 tablet (10 mg total) by mouth daily., Disp: 90 tablet, Rfl: 3   FLUoxetine (PROZAC) 40 MG capsule, Take 1 capsule (40 mg total) by mouth daily., Disp: 90 capsule, Rfl: 3   furosemide (LASIX) 40 MG tablet, Take 1 tablet (40 mg) for 3 lb weight gain overnight. (Patient not taking: Reported on 05/02/2023), Disp: 30 tablet, Rfl: 11   Glucose Blood (BLOOD GLUCOSE TEST STRIPS) STRP, Use to check glucose twice daily, Disp: 100 strip, Rfl:  11   glucose blood test strip, Frequency:PHARMDIR   Dosage:0.0     Instructions:  Note:testing 3-4xqd, DX Code 250.00 Dose: 1, Disp: , Rfl:    insulin degludec (TRESIBA FLEXTOUCH) 100 UNIT/ML FlexTouch Pen, Inject 16 Units into the skin at bedtime., Disp: 4.5 mL, Rfl: 2   Insulin Pen Needle (BD PEN NEEDLE NANO U/F) 32G X 4 MM MISC, Use to check glucose once daily, Disp: 100 each, Rfl: 6   levothyroxine (SYNTHROID) 50 MCG tablet, Take 1 tablet (50 mcg total) by mouth daily before breakfast. (Patient taking differently: Take 50 mcg by mouth at bedtime.), Disp: 90 tablet, Rfl: 3   metFORMIN (GLUCOPHAGE-XR) 500 MG 24 hr tablet, Take 1 tablet (500 mg total) by mouth daily with breakfast. Resume on 01/18/23, Disp: 90 tablet, Rfl: 3   metoprolol succinate (TOPROL-XL) 25 MG 24 hr tablet, Take 1 tablet (25 mg total) by mouth daily., Disp: 90 tablet, Rfl: 3   nitroGLYCERIN (NITROSTAT) 0.4 MG SL tablet, Place 1 tablet (0.4 mg total) under the tongue every 5 (five) minutes as needed for chest pain., Disp: 25 tablet, Rfl: 3   sacubitril-valsartan (ENTRESTO) 24-26 MG, Take 1 tablet by mouth 2 (two) times daily., Disp: 180 tablet, Rfl: 3   sodium chloride (OCEAN) 0.65 % SOLN nasal spray, Place 1 spray into both nostrils as needed for congestion., Disp: 30 mL, Rfl: 0   spironolactone (ALDACTONE) 25 MG tablet, Take 0.5 tablets (12.5 mg total)  by mouth daily., Disp: 45 tablet, Rfl: 3   Study - LIBREXIA-ACS - milvexian 25 mg or placebo tablet (PI-Stuckey), Take 1 tablet by mouth 2 (two) times daily. Take 1 tablet by mouth twice a day with or without food. Bring bottle back to every visit. Please contact [place] [facility] for any questions or concerns regarding the medication, Disp: 280 tablet, Rfl: 0   Vitamin D, Ergocalciferol, (DRISDOL) 1.25 MG (50000 UNIT) CAPS capsule, Take 1 capsule (50,000 Units total) by mouth every 7 (seven) days., Disp: 10 capsule, Rfl: 1  Past Medical History: Past Medical History:  Diagnosis  Date   Anxiety 07/10/2014   Cancer (HCC) 12/13/2011   melanoma face   CHF (congestive heart failure), NYHA class II, acute on chronic, combined (HCC) 01/14/2023   Chronic kidney disease (CKD), stage III (moderate) (HCC) 02/17/2022   Chronic sinusitis 07/02/2022   Depression 07/10/2014   Diabetes mellitus without complication (HCC) 08/21/2014   Fracture of tibial plateau 12/31/2015   Hyperlipidemia 08/14/2015   Hypertension 09/03/2014   Hypothyroidism, postablative 02/08/2014   Myocardial infarction (HCC) 1993   Tick bites 10/29/2014   Vitamin D deficiency 12/08/2022    Tobacco Use: Social History   Tobacco Use  Smoking Status Former   Current packs/day: 0.00   Types: Cigarettes   Quit date: 06/11/1982   Years since quitting: 41.0  Smokeless Tobacco Never  Tobacco Comments   smoking cessation materials not required    Labs: Review Flowsheet  More data exists      Latest Ref Rng & Units 02/10/2022 05/10/2022 12/06/2022 01/12/2023 05/02/2023  Labs for ITP Cardiac and Pulmonary Rehab  Cholestrol 0 - 200 mg/dL 161  096  045  409  -  LDL (calc) 0 - 99 mg/dL 84  811  914  782  -  HDL-C >40 mg/dL 47  51  51  57  -  Trlycerides <150 mg/dL 956  213  086  578  -  Hemoglobin A1c 4.0 - 5.6 % 7.9  - 9.3  9.4  7.8     Details            Capillary Blood Glucose: Lab Results  Component Value Date   GLUCAP 215 (H) 05/30/2023   GLUCAP 141 (H) 05/13/2023   GLUCAP 144 (H) 05/11/2023   GLUCAP 257 (H) 05/11/2023   GLUCAP 160 (H) 05/06/2023     Exercise Target Goals: Exercise Program Goal: Individual exercise prescription set using results from initial 6 min walk test and THRR while considering  patient's activity barriers and safety.   Exercise Prescription Goal: Starting with aerobic activity 30 plus minutes a day, 3 days per week for initial exercise prescription. Provide home exercise prescription and guidelines that participant acknowledges understanding prior to  discharge.  Activity Barriers & Risk Stratification:  Activity Barriers & Cardiac Risk Stratification - 05/03/23 1247       Activity Barriers & Cardiac Risk Stratification   Activity Barriers Joint Problems;Other (comment);Deconditioning;Muscular Weakness;Balance Concerns;Back Problems;Decreased Ventricular Function    Comments previous L knee injury, buldging disc occassional pain, can't floor currently    Cardiac Risk Stratification High             6 Minute Walk:  6 Minute Walk     Row Name 05/03/23 1420         6 Minute Walk   Phase Initial     Distance 794 feet     Walk Time 6 minutes     # of  Rest Breaks 0     MPH 1.5     METS 1.24     RPE 13     Perceived Dyspnea  2     VO2 Peak 4.37     Symptoms Yes (comment)     Comments chest tightness (3/10), harder to breathe/SOB     Resting HR 56 bpm     Resting BP 132/74     Resting Oxygen Saturation  97 %     Exercise Oxygen Saturation  during 6 min walk 96 %     Max Ex. HR 99 bpm     Max Ex. BP 136/64     2 Minute Post BP 106/62              Oxygen Initial Assessment:   Oxygen Re-Evaluation:   Oxygen Discharge (Final Oxygen Re-Evaluation):   Initial Exercise Prescription:  Initial Exercise Prescription - 05/03/23 1400       Date of Initial Exercise RX and Referring Provider   Date 05/03/23    Referring Provider [name] MD   Attending Cardiologist: Dr. Carolan Clines     Oxygen   Maintain Oxygen Saturation 88% or higher      Treadmill   MPH 1.2    Grade 0    Minutes 15    METs 1.92      NuStep   Level 1    SPM 80    Minutes 15    METs 1.5      Prescription Details   Frequency (times per week) 3    Duration Progress to 30 minutes of continuous aerobic without signs/symptoms of physical distress      Intensity   THRR 40-80% of Max Heartrate 90-124    Ratings of Perceived Exertion 11-13    Perceived Dyspnea 0-4      Progression   Progression Continue to progress workloads to  maintain intensity without signs/symptoms of physical distress.      Resistance Training   Training Prescription Yes    Weight 3 lb    Reps 10-15             Perform Capillary Blood Glucose checks as needed.  Exercise Prescription Changes:   Exercise Prescription Changes     Row Name 05/03/23 1400 05/09/23 1300 05/23/23 1300 06/20/23 1300       Response to Exercise   Blood Pressure (Admit) 132/74 120/60 106/64 94/50    Blood Pressure (Exercise) 136/64 120/60 -- --    Blood Pressure (Exit) 106/62 98/58 114/62 90/48    Heart Rate (Admit) 56 bpm 71 bpm 99 bpm 60 bpm    Heart Rate (Exercise) 99 bpm 82 bpm 100 bpm 90 bpm    Heart Rate (Exit) 62 bpm 69 bpm 70 bpm 70 bpm    Oxygen Saturation (Admit) 97 % -- -- --    Oxygen Saturation (Exercise) 96 % -- -- --    Rating of Perceived Exertion (Exercise) 13 12 13 13     Perceived Dyspnea (Exercise) 2 -- -- --    Symptoms chest tightness 3/10, harder to breathe/SOB -- -- --    Comments walk test results -- -- --    Duration -- Continue with 30 min of aerobic exercise without signs/symptoms of physical distress. Continue with 30 min of aerobic exercise without signs/symptoms of physical distress. Continue with 30 min of aerobic exercise without signs/symptoms of physical distress.    Intensity -- THRR unchanged THRR unchanged THRR unchanged  Progression   Progression -- Continue to progress workloads to maintain intensity without signs/symptoms of physical distress. Continue to progress workloads to maintain intensity without signs/symptoms of physical distress. Continue to progress workloads to maintain intensity without signs/symptoms of physical distress.      Resistance Training   Training Prescription -- Yes Yes Yes    Weight -- 2 1 3     Reps -- 10-15 10-15 10-15      Treadmill   MPH -- 1.2 2 --    Grade -- 0 0 --    Minutes -- 15 15 --    METs -- 1.92 2.53 --      NuStep   Level -- 2 1 4     SPM -- 69 75 79     Minutes -- 15 15 30     METs -- 1.8 1.8 2      Oxygen   Maintain Oxygen Saturation -- 88% or higher 88% or higher 88% or higher             Exercise Comments:   Exercise Goals and Review:   Exercise Goals     Row Name 05/03/23 1424             Exercise Goals   Increase Physical Activity Yes       Intervention Provide advice, education, support and counseling about physical activity/exercise needs.;Develop an individualized exercise prescription for aerobic and resistive training based on initial evaluation findings, risk stratification, comorbidities and participant's personal goals.       Expected Outcomes Short Term: Attend rehab on a regular basis to increase amount of physical activity.;Long Term: Add in home exercise to make exercise part of routine and to increase amount of physical activity.;Long Term: Exercising regularly at least 3-5 days a week.       Increase Strength and Stamina Yes       Intervention Provide advice, education, support and counseling about physical activity/exercise needs.;Develop an individualized exercise prescription for aerobic and resistive training based on initial evaluation findings, risk stratification, comorbidities and participant's personal goals.       Expected Outcomes Short Term: Increase workloads from initial exercise prescription for resistance, speed, and METs.;Short Term: Perform resistance training exercises routinely during rehab and add in resistance training at home;Long Term: Improve cardiorespiratory fitness, muscular endurance and strength as measured by increased METs and functional capacity ( )       Able to understand and use rate of perceived exertion (RPE) scale Yes       Intervention Provide education and explanation on how to use RPE scale       Expected Outcomes Short Term: Able to use RPE daily in rehab to express subjective intensity level;Long Term:  Able to use RPE to guide intensity level when exercising  independently       Able to understand and use Dyspnea scale Yes       Intervention Provide education and explanation on how to use Dyspnea scale       Expected Outcomes Short Term: Able to use Dyspnea scale daily in rehab to express subjective sense of shortness of breath during exertion;Long Term: Able to use Dyspnea scale to guide intensity level when exercising independently       Knowledge and understanding of Target Heart Rate Range (THRR) Yes       Intervention Provide education and explanation of THRR including how the numbers were predicted and where they are located for reference  Expected Outcomes Short Term: Able to state/look up THRR;Long Term: Able to use THRR to govern intensity when exercising independently;Short Term: Able to use daily as guideline for intensity in rehab       Able to check pulse independently Yes       Intervention Provide education and demonstration on how to check pulse in carotid and radial arteries.;Review the importance of being able to check your own pulse for safety during independent exercise       Expected Outcomes Short Term: Able to explain why pulse checking is important during independent exercise;Long Term: Able to check pulse independently and accurately       Understanding of Exercise Prescription Yes       Intervention Provide education, explanation, and written materials on patient's individual exercise prescription       Expected Outcomes Short Term: Able to explain program exercise prescription;Long Term: Able to explain home exercise prescription to exercise independently                Exercise Goals Re-Evaluation :  Exercise Goals Re-Evaluation     Row Name 05/03/23 1425 05/09/23 1348 05/11/23 1112 05/27/23 1347 06/03/23 1102     Exercise Goal Re-Evaluation   Exercise Goals Review Knowledge and understanding of Target Heart Rate Range (THRR);Able to understand and use Dyspnea scale;Understanding of Exercise Prescription --  Increase Physical Activity;Increase Strength and Stamina;Understanding of Exercise Prescription Increase Physical Activity;Understanding of Exercise Prescription;Increase Strength and Stamina Increase Strength and Stamina;Increase Physical Activity;Understanding of Exercise Prescription   Comments Reviewed RPE  and dyspnea scale and program prescription with pt today.  Pt voiced understanding and was given a copy of goals to take home. She is tolerating exercise well but has not increased on her workloads yet. She grips the treadmill when walking she states that it bothers her shoulders and arms Pt states that saw a difference in her strength after her first time in rehab.  She states that feels stronger and has been able to do more around the house such as cooking and cleaning.  Pt stated that "this is the best I have felt in a long time."  Pt is not exercising at home right now.  She states that the heat prevents her from walking outside, but she is interested in going to the [facility] to exercise. Takeesha has increased her speed on the treadmill to 2.0. Will continue to monitor and progress as able. Madonna has been complaning of lower back pain due to a disc that she has. She is not using the treadmill to see if it's causing her to have the back pain after walking prolonged time on it. She said that her legs have gotten stronger. She is able to use her steps at home without needing to hold on. Flonnie Hailstone is going to start walking with her partner at home. Went over exercising at home and sent home exercise packet.   Expected Outcomes Short: Use RPE daily to regulate intensity.  Long: Follow program prescription -- Short term:  Go over home exercise plan  Long term:  Pt will start exercising at [facility] or home Short term: increase level on the NuStep in the next week   long term: continue to attend cardiac rehab Short term: increase level on NuStep to help build endrance   long term: continue to attend cardiac rehab and exercise  at home             Discharge Exercise Prescription (Final Exercise Prescription Changes):  Exercise Prescription Changes - 06/20/23 1300       Response to Exercise   Blood Pressure (Admit) 94/50    Blood Pressure (Exit) 90/48    Heart Rate (Admit) 60 bpm    Heart Rate (Exercise) 90 bpm    Heart Rate (Exit) 70 bpm    Rating of Perceived Exertion (Exercise) 13    Duration Continue with 30 min of aerobic exercise without signs/symptoms of physical distress.    Intensity THRR unchanged      Progression   Progression Continue to progress workloads to maintain intensity without signs/symptoms of physical distress.      Resistance Training   Training Prescription Yes    Weight 3    Reps 10-15      NuStep   Level 4    SPM 79    Minutes 30    METs 2      Oxygen   Maintain Oxygen Saturation 88% or higher             Nutrition:  Target Goals: Understanding of nutrition guidelines, daily intake of sodium 1500mg , cholesterol 200mg , calories 30% from fat and 7% or less from saturated fats, daily to have 5 or more servings of fruits and vegetables.  Biometrics:  Pre Biometrics - 05/03/23 1425       Pre Biometrics   Height 5' 0.25" (1.53 m)    Weight 160 lb 12.8 oz (72.9 kg)    Waist Circumference 36.5 inches    Hip Circumference 39.5 inches    Waist to Hip Ratio 0.92 %    BMI (Calculated) 31.16    Triceps Skinfold 20 mm    % Body Fat 41.1 %    Grip Strength 19.6 kg    Single Leg Stand 2 seconds              Nutrition Therapy Plan and Nutrition Goals:  Nutrition Therapy & Goals - 05/03/23 1307       Intervention Plan   Intervention Prescribe, educate and counsel regarding individualized specific dietary modifications aiming towards targeted core components such as weight, hypertension, lipid management, diabetes, heart failure and other comorbidities.;Nutrition handout(s) given to patient.    Expected Outcomes Short Term Goal: Understand basic  principles of dietary content, such as calories, fat, sodium, cholesterol and nutrients.             Nutrition Assessments:  MEDIFICTS Score Key: >=70 Need to make dietary changes  40-70 Heart Healthy Diet <= 40 Therapeutic Level Cholesterol Diet  Flowsheet Row CARDIAC REHAB PHASE II ORIENTATION from 05/03/2023 in [facility] CARDIAC REHABILITATION  Picture Your Plate Total Score on Admission 71      Picture Your Plate Scores: <16 Unhealthy dietary pattern with much room for improvement. 41-50 Dietary pattern unlikely to meet recommendations for good health and room for improvement. 51-60 More healthful dietary pattern, with some room for improvement.  >60 Healthy dietary pattern, although there may be some specific behaviors that could be improved.    Nutrition Goals Re-Evaluation:  Nutrition Goals Re-Evaluation     Row Name 05/11/23 1143 06/03/23 1115           Goals   Nutrition Goal Heart healthy diet Heart healthy diet      Comment Pt states that her diet has improved and she has lost quite a bit of weight.  Pt does not eat much red meat because she prefers to eat chicken.  She enjoys eating a small bowl of icecream  a few times a week; otherwise, she tries to stay away from sweets except for fresh fruit. Pt diet continues to improve. She states that her partner found out he has fatty liver so they have started to eat veggies at dinner and cutting out red meats. She said she has lost a pound with this but has noticed her sugar is low in the morning.      Expected Outcome Short term:  Pt is happy with her eating habits-maintain focus on healthy eating               Long term:  Pt will follow a heart healthy diet. Short term: pt going to eat more at night to watch sugar   long term: continue to follow healthy diet               Nutrition Goals Discharge (Final Nutrition Goals Re-Evaluation):  Nutrition Goals Re-Evaluation - 06/03/23 1115       Goals   Nutrition Goal  Heart healthy diet    Comment Pt diet continues to improve. She states that her partner found out he has fatty liver so they have started to eat veggies at dinner and cutting out red meats. She said she has lost a pound with this but has noticed her sugar is low in the morning.    Expected Outcome Short term: pt going to eat more at night to watch sugar   long term: continue to follow healthy diet             Psychosocial: Target Goals: Acknowledge presence or absence of significant depression and/or stress, maximize coping skills, provide positive support system. Participant is able to verbalize types and ability to use techniques and skills needed for reducing stress and depression.  Initial Review & Psychosocial Screening:  Initial Psych Review & Screening - 05/03/23 1259       Initial Review   Current issues with Current Stress Concerns;Current Depression;Current Anxiety/Panic;Current Sleep Concerns    Source of Stress Concerns Family;Chronic Illness;Unable to perform yard/household activities;Unable to participate in former interests or hobbies    Comments ex-husband can be stressful sometimes, heart health was unexpected and trying to recover and not be able to go and do, situational depression/anxiety, partner is a [employer] loss adjuster, got bed bugs last week from something from [employer] and now it keeps her up at night looking out for things, PHQ is a 10, participating in heart study      Family Dynamics   Good Support System? Yes   significant other of 21 yrs, 3 grown kids     Barriers   Psychosocial barriers to participate in program Psychosocial barriers identified (see note);The patient should benefit from training in stress management and relaxation.      Screening Interventions   Interventions Encouraged to exercise;Provide feedback about the scores to participant;To provide support and resources with identified psychosocial needs    Expected Outcomes Short Term goal: Utilizing  psychosocial counselor, staff and physician to assist with identification of specific Stressors or current issues interfering with healing process. Setting desired goal for each stressor or current issue identified.;Long Term Goal: Stressors or current issues are controlled or eliminated.;Short Term goal: Identification and review with participant of any Quality of Life or Depression concerns found by scoring the questionnaire.;Long Term goal: The participant improves quality of Life and PHQ9 Scores as seen by post scores and/or verbalization of changes             Quality  of Life Scores:  Quality of Life - 05/03/23 1426       Quality of Life   Select Quality of Life      Quality of Life Scores   Health/Function Pre 13.25 %    Socioeconomic Pre 11.83 %    Psych/Spiritual Pre 13.17 %    Family Pre 18.2 %    GLOBAL Pre 13.76 %            Scores of 19 and below usually indicate a poorer quality of life in these areas.  A difference of  2-3 points is a clinically meaningful difference.  A difference of 2-3 points in the total score of the Quality of Life Index has been associated with significant improvement in overall quality of life, self-image, physical symptoms, and general health in studies assessing change in quality of life.  PHQ-9: Review Flowsheet  More data exists      06/14/2023 05/03/2023 03/23/2023 01/05/2023 12/08/2022  Depression screen PHQ 2/9  Decreased Interest 0 0 1 1 2   Down, Depressed, Hopeless 0 2 1 1 1   PHQ - 2 Score 0 2 2 2 3   Altered sleeping - 3 2 - 0  Tired, decreased energy - 3 0 - 1  Change in appetite - 0 0 - 0  Feeling bad or failure about yourself  - 0 0 - 0  Trouble concentrating - 1 0 - 0  Moving slowly or fidgety/restless - 1 0 - 0  Suicidal thoughts - 0 0 - 0  PHQ-9 Score - 10 4 - 4  Difficult doing work/chores - Very difficult - - Somewhat difficult    Details           Interpretation of Total Score  Total Score Depression Severity:   1-4 = Minimal depression, 5-9 = Mild depression, 10-14 = Moderate depression, 15-19 = Moderately severe depression, 20-27 = Severe depression   Psychosocial Evaluation and Intervention:  Psychosocial Evaluation - 05/03/23 1429       Psychosocial Evaluation & Interventions   Interventions Stress management education;Relaxation education;Encouraged to exercise with the program and follow exercise prescription    Comments Lacheryl is coming into cardiac rehab after a STEMI in April.  She has a previous heart history including MI and CABG.  She was unsure why she suddenly had a heart attack and went into heart failure. She was excited to be able to come off of her LifeVest last week after her TEE showed improvement!!  She has a great support system in her partner of 21 years.  They look out for each other but he is a bad influence on her eating, but she has recently lost almost 50 lb since taking over her diet herself!!  She wants to get moving again and to feel better again.  She wants to be able to walk without having to stop and rest for her breath and chest pain.  She normally is a good sleeper, but recently they got a bout of bed bugs and she wakes in the night hunting for them.  She is hoping to be rid of them soon!  She did not have any traces of bed bugs on her today and was encouraged to continue to launder her bedding to clear them up.  She is retired and on a limited monthly income, but makes the best of it.  One of her stressors is her exhusband when she has to deal with him from time to time.  She does  have some occassional depression symptoms especially since she isn't able to go and do like she would like to currently.  Her PHQ is currently a 10 and we will reassess in a month.  Currently attributted to not sleeping well and not haveing the energy that she would like to have.  She also wants to build up her strength and stamina overall.  She has no barriers to attending rehab other than her fatigue  levels.  She is eage to get started to get to feeling better.    Expected Outcomes Short: Attend rehab to build up strength and stamina Long: Continue to work on improving sleep overall.    Continue Psychosocial Services  Follow up required by staff             Psychosocial Re-Evaluation:  Psychosocial Re-Evaluation     Row Name 05/06/23 1109 05/11/23 1121 06/03/23 1111         Psychosocial Re-Evaluation   Current issues with Current Stress Concerns Current Stress Concerns;Current Depression Current Stress Concerns;Current Depression     Comments Spoke to pt about bedbugs again.  She says they have not seen any over the last two nights.  Asked her to keep [place] updated. Pt is sleeping better since she and her partner have not seen any bedbugs for a few weeks.  She does still have some depression related to her diagnosis of heart failure, but she states that the depression has improved since she has more energy to do activities around the house.  She does worry since her father died of heart disease at an early age.  She still takes Prozac for depression, but she states that she would like to either get off it or try another anti depressant.  We spoke about talking to her primary doctor or his nurse about the Prozac. Pt has been staying up late watching TV so her sleep has been off recently. She continues to not see bedbugs. She likes to get up before her partner in the morning to have her coffee and quiet time to herself. She continues to have depression related to her diagosis.     Expected Outcomes Short: Keep watch on bedbugs Long: Completely gone Short term:  Continue to have a positive attitude and do more activities around the house           Long term:  Once she has established rapport with her new primary care MD  talk to him about Prozac Short term:  Continue to have a positive attitude and do more activities around the house           Long term:  continue to identify concenrs and have  positive attitude     Interventions Stress management education;Encouraged to attend Cardiac Rehabilitation for the exercise Stress management education;Relaxation education;Encouraged to attend Cardiac Rehabilitation for the exercise Stress management education;Relaxation education;Encouraged to attend Cardiac Rehabilitation for the exercise     Continue Psychosocial Services  Follow up required by staff Follow up required by staff Follow up required by staff       Initial Review   Source of Stress Concerns -- Chronic Illness;Family --              Psychosocial Discharge (Final Psychosocial Re-Evaluation):  Psychosocial Re-Evaluation - 06/03/23 1111       Psychosocial Re-Evaluation   Current issues with Current Stress Concerns;Current Depression    Comments Pt has been staying up late watching TV so her sleep has been off  recently. She continues to not see bedbugs. She likes to get up before her partner in the morning to have her coffee and quiet time to herself. She continues to have depression related to her diagosis.    Expected Outcomes Short term:  Continue to have a positive attitude and do more activities around the house           Long term:  continue to identify concenrs and have positive attitude    Interventions Stress management education;Relaxation education;Encouraged to attend Cardiac Rehabilitation for the exercise    Continue Psychosocial Services  Follow up required by staff             Vocational Rehabilitation: Provide vocational rehab assistance to qualifying candidates.   Vocational Rehab Evaluation & Intervention:  Vocational Rehab - 05/03/23 1258       Initial Vocational Rehab Evaluation & Intervention   Assessment shows need for Vocational Rehabilitation No   retired            Education: Education Goals: Education classes will be provided on a weekly basis, covering required topics. Participant will state understanding/return demonstration of  topics presented.  Learning Barriers/Preferences:  Learning Barriers/Preferences - 05/03/23 1247       Learning Barriers/Preferences   Learning Barriers Hearing   R hearing aids, L deaf   Learning Preferences None             Education Topics: Hypertension, Hypertension Reduction -Define heart disease and high blood pressure. Discus how high blood pressure affects the body and ways to reduce high blood pressure.   Exercise and Your Heart -Discuss why it is important to exercise, the FITT principles of exercise, normal and abnormal responses to exercise, and how to exercise safely.   Angina -Discuss definition of angina, causes of angina, treatment of angina, and how to decrease risk of having angina.   Cardiac Medications -Review what the following cardiac medications are used for, how they affect the body, and side effects that may occur when taking the medications.  Medications include Aspirin, Beta blockers, calcium channel blockers, ACE Inhibitors, angiotensin receptor blockers, diuretics, digoxin, and antihyperlipidemics. Flowsheet Row CARDIAC REHAB PHASE II EXERCISE from 06/15/2023 in [place] [facility]  Date 05/04/23  Educator HB  Instruction Review Code 1- Verbalizes Understanding       Congestive Heart Failure -Discuss the definition of CHF, how to live with CHF, the signs and symptoms of CHF, and how keep track of weight and sodium intake. Flowsheet Row CARDIAC REHAB PHASE II EXERCISE from 06/15/2023 in [place] [facility]  Date 05/11/23  Educator HB  Instruction Review Code 1- Verbalizes Understanding       Heart Disease and Intimacy -Discus the effect sexual activity has on the heart, how changes occur during intimacy as we age, and safety during sexual activity. Flowsheet Row CARDIAC REHAB PHASE II EXERCISE from 06/15/2023 in [place] [facility]  Date 05/18/23  Educator [facility]  Instruction Review Code 1- Verbalizes  Understanding       Smoking Cessation / COPD -Discuss different methods to quit smoking, the health benefits of quitting smoking, and the definition of COPD. Flowsheet Row CARDIAC REHAB PHASE II EXERCISE from 06/15/2023 in [place] [facility]  Date 06/15/23  Educator jh  Instruction Review Code 1- Verbalizes Understanding       Nutrition I: Fats -Discuss the types of cholesterol, what cholesterol does to the heart, and how cholesterol levels can be controlled. Flowsheet Row CARDIAC REHAB  PHASE II EXERCISE from 06/15/2023 in [place] [facility]  Date 05/25/23  Educator [facility] [place]  Instruction Review Code 1- Verbalizes Understanding       Nutrition II: Labels -Discuss the different components of food labels and how to read food label Flowsheet Row CARDIAC REHAB PHASE II EXERCISE from 06/15/2023 in [place] [facility]ARDIAC [facility]  Date 05/25/23  Educator [facility]  Instruction Review Code 1- Verbalizes Understanding       Heart Parts/Heart Disease and PAD -Discuss the anatomy of the heart, the pathway of blood circulation through the heart, and these are affected by heart disease.   Stress I: Signs and Symptoms -Discuss the causes of stress, how stress may lead to anxiety and depression, and ways to limit stress.   Stress II: Relaxation -Discuss different types of relaxation techniques to limit stress.   Warning Signs of Stroke / TIA -Discuss definition of a stroke, what the signs and symptoms are of a stroke, and how to identify when someone is having stroke.   Knowledge Questionnaire Score:  Knowledge Questionnaire Score - 05/03/23 1309       Knowledge Questionnaire Score   Pre Score 22/24             Core Components/Risk Factors/Patient Goals at Admission:  Personal Goals and Risk Factors at Admission - 05/03/23 1258       Core Components/Risk Factors/Patient Goals on Admission    Weight Management Yes;Obesity;Weight Maintenance;Weight  Loss    Intervention Weight Management: Develop a combined nutrition and exercise program designed to reach desired caloric intake, while maintaining appropriate intake of nutrient and fiber, sodium and fats, and appropriate energy expenditure required for the weight goal.;Weight Management: Provide education and appropriate resources to help participant work on and attain dietary goals.;Weight Management/Obesity: Establish reasonable short term and long term weight goals.;Obesity: Provide education and appropriate resources to help participant work on and attain dietary goals.    Admit Weight 160 lb 12.8 oz (72.9 kg)    Goal Weight: Short Term 155 lb (70.3 kg)    Goal Weight: Long Term 155 lb (70.3 kg)    Expected Outcomes Short Term: Continue to assess and modify interventions until short term weight is achieved;Long Term: Adherence to nutrition and physical activity/exercise program aimed toward attainment of established weight goal;Weight Loss: Understanding of general recommendations for a balanced deficit meal plan, which promotes 1-2 lb weight loss per week and includes a negative energy balance of [phone] kcal/d;Understanding recommendations for meals to include 15-35% energy as protein, 25-35% energy from fat, 35-60% energy from carbohydrates, less than 200mg  of dietary cholesterol, 20-35 gm of total fiber daily;Understanding of distribution of calorie intake throughout the day with the consumption of 4-5 meals/snacks;Weight Maintenance: Understanding of the daily nutrition guidelines, which includes 25-35% calories from fat, 7% or less cal from saturated fats, less than 200mg  cholesterol, less than 1.5gm of sodium, & 5 or more servings of fruits and vegetables daily    Diabetes Yes    Intervention Provide education about signs/symptoms and action to take for hypo/hyperglycemia.;Provide education about proper nutrition, including hydration, and aerobic/resistive exercise prescription along with  prescribed medications to achieve blood glucose in normal ranges: Fasting glucose 65-99 mg/dL    Expected Outcomes Short Term: Participant verbalizes understanding of the signs/symptoms and immediate care of hyper/hypoglycemia, proper foot care and importance of medication, aerobic/resistive exercise and nutrition plan for blood glucose control.;Long Term: Attainment of HbA1C < 7%.    Heart Failure Yes    Intervention  Provide a combined exercise and nutrition program that is supplemented with education, support and counseling about heart failure. Directed toward relieving symptoms such as shortness of breath, decreased exercise tolerance, and extremity edema.    Expected Outcomes Improve functional capacity of life;Short term: Attendance in program 2-3 days a week with increased exercise capacity. Reported lower sodium intake. Reported increased fruit and vegetable intake. Reports medication compliance.;Short term: Daily weights obtained and reported for increase. Utilizing diuretic protocols set by physician.;Long term: Adoption of self-care skills and reduction of barriers for early signs and symptoms recognition and intervention leading to self-care maintenance.    Hypertension Yes    Intervention Provide education on lifestyle modifcations including regular physical activity/exercise, weight management, moderate sodium restriction and increased consumption of fresh fruit, vegetables, and low fat dairy, alcohol moderation, and smoking cessation.;Monitor prescription use compliance.    Expected Outcomes Short Term: Continued assessment and intervention until BP is < 140/80mm HG in hypertensive participants. < 130/42mm HG in hypertensive participants with diabetes, heart failure or chronic kidney disease.;Long Term: Maintenance of blood pressure at goal levels.    Lipids Yes    Intervention Provide education and support for participant on nutrition & aerobic/resistive exercise along with prescribed  medications to achieve LDL 70mg , HDL >40mg .    Expected Outcomes Short Term: Participant states understanding of desired cholesterol values and is compliant with medications prescribed. Participant is following exercise prescription and nutrition guidelines.;Long Term: Cholesterol controlled with medications as prescribed, with individualized exercise RX and with personalized nutrition plan. Value goals: LDL < 70mg , HDL > 40 mg.             Core Components/Risk Factors/Patient Goals Review:   Goals and Risk Factor Review     Row Name 05/11/23 1152 06/03/23 1120           Core Components/Risk Factors/Patient Goals Review   Personal Goals Review Weight Management/Obesity;Heart Failure;Hypertension;Lipids;Diabetes Weight Management/Obesity;Diabetes;Hypertension      Review Pt checks her blood sugar 1-2 times a day.  She states that she forgets a lot a night but she usually remembers to check it in the morning.  She does not check her BP or her weight at home, but she does check for swelling in her feet/legs daily.  We talked about the importance of daily weights, as well as checking her BP at home since she has a machine.  Since her partner also needs to check his BP at home we talked about them trying to check their BP together. Pt said that due to starting to eat veggie for dinner she has noticed her CBS in the mornings have been 66. She is going to eat a little more with dinner to make sure her CBG does not drop in the moring. She is going to get a new blood pressure cuff to start taking it at home. She and her partner will take it together to keep accountable.      Expected Outcomes Short term:  Pt will start checking her weight daily and also checking her BP at home.            Long term:  Better understanding of heart failure Short term: check BP daily   long term: continue to watch sugar and montior BP               Core Components/Risk Factors/Patient Goals at Discharge (Final  Review):   Goals and Risk Factor Review - 06/03/23 1120       Core  Components/Risk Factors/Patient Goals Review   Personal Goals Review Weight Management/Obesity;Diabetes;Hypertension    Review Pt said that due to starting to eat veggie for dinner she has noticed her CBS in the mornings have been 66. She is going to eat a little more with dinner to make sure her CBG does not drop in the moring. She is going to get a new blood pressure cuff to start taking it at home. She and her partner will take it together to keep accountable.    Expected Outcomes Short term: check BP daily   long term: continue to watch sugar and montior BP             ITP Comments:  ITP Comments     Row Name 05/03/23 1418 05/04/23 1125 05/25/23 0828 06/22/23 0821     ITP Comments Patient attend orientation today.  Patient is attendingCardiac Rehabilitation Program.  Documentation for diagnosis can be found in [facility] encounter [facility]/22/23.  Reviewed medical chart, RPE/RPD, gym safety, and program guidelines.  Patient was fitted to equipment they will be using during rehab.  Patient is scheduled to start exercise on 05/04/23.   Initial ITP created and sent for review and signature by Dr. Dina Rich, Medical Director for Cardiac Rehabilitation Program. First full day of exercise!  Patient was oriented to gym and equipment including functions, settings, policies, and procedures.  Patient's individual exercise prescription and treatment plan were reviewed.  All starting workloads were established based on the results of the 6 minute walk test done at initial orientation visit.  The plan for exercise progression was also introduced and progression will be customized based on patient's performance and goals 30 day review completed. ITP sent to Dr. Dina Rich, Medical Director of Cardiac Rehab. Continue with ITP unless changes are made by physician. 30 day review completed. ITP sent to Dr. Dina Rich, Medical Director of  Cardiac Rehab. Continue with ITP unless changes are made by physician.             Comments: 30 day review

## 2023-06-22 NOTE — Progress Notes (Signed)
Daily Session Note  Patient Details  Name: Angela Rose MRN: [id number] Date of Birth: 1946-06-16 Referring Provider:   Flowsheet Row CARDIAC REHAB PHASE II ORIENTATION from 05/03/2023 in [facility] CARDIAC REHABILITATION  Referring Provider [name] MD  [Attending Cardiologist: Dr. Carolan Clines       Encounter Date: 06/22/2023  Check In:  Session Check In - 06/22/23 1030       Check-In   Supervising physician immediately available to respond to emergencies See telemetry face sheet for immediately available MD    Location [facility]    Staff Present Ross Ludwig, BS, Exercise Physiologist;Weslee Fogg Daphine Deutscher, RN, BSN;Hillary Troutman BSN, RN;Jessica Beacon, MA, RCEP, CCRP, CCET    Virtual Visit No    Medication changes reported     No    Fall or balance concerns reported    No    Tobacco Cessation No Change    Warm-up and Cool-down Performed on first and last piece of equipment    Resistance Training Performed Yes    VAD Patient? No      Pain Assessment   Currently in Pain? No/denies             Capillary Blood Glucose: No results found for this or any previous visit (from the past 24 hour(s)).    Social History   Tobacco Use  Smoking Status Former   Current packs/day: 0.00   Types: Cigarettes   Quit date: 06/11/1982   Years since quitting: 41.0  Smokeless Tobacco Never  Tobacco Comments   smoking cessation materials not required    Goals Met:  Independence with exercise equipment Exercise tolerated well No report of concerns or symptoms today Strength training completed today  Goals Unmet:  Not Applicable  Comments: Pt able to follow exercise prescription today without complaint.  Will continue to monitor for progression.    Dr. Dina Rich is Medical Director for [facility] Cardiac Rehab

## 2023-06-23 DIAGNOSIS — E119 Type 2 diabetes mellitus without complications: Secondary | ICD-10-CM | POA: Diagnosis not present

## 2023-06-23 DIAGNOSIS — R3981 Functional urinary incontinence: Secondary | ICD-10-CM | POA: Diagnosis not present

## 2023-06-24 ENCOUNTER — Encounter (HOSPITAL_COMMUNITY)
Admission: RE | Admit: 2023-06-24 | Discharge: 2023-06-24 | Disposition: A | Discharge status: Home / Self Care | Payer: Medicare HMO | Source: Ambulatory Visit | Attending: Cardiology | Admitting: Cardiology

## 2023-06-24 ENCOUNTER — Encounter (HOSPITAL_COMMUNITY): Payer: Medicare HMO

## 2023-06-24 DIAGNOSIS — I2129 ST elevation (STEMI) myocardial infarction involving other sites: Secondary | ICD-10-CM

## 2023-06-24 NOTE — Progress Notes (Signed)
Daily Session Note  Patient Details  Name: Angela Rose MRN: [id number] Date of Birth: 05/17/1946 Referring Provider:   Flowsheet Row CARDIAC REHAB PHASE II ORIENTATION from 05/03/2023 in [facility] CARDIAC REHABILITATION  Referring Provider [name] MD  [Attending Cardiologist: Dr. Carolan Clines       Encounter Date: 06/24/2023  Check In:  Session Check In - 06/24/23 1100       Check-In   Supervising physician immediately available to respond to emergencies See telemetry face sheet for immediately available MD    Location [facility]    Staff Present Ross Ludwig, BS, Exercise Physiologist;Debra Laural Benes, RN, Pleas Koch, RN, BSN;Jessica Hawkins, MA, RCEP, CCRP, CCET    Virtual Visit No    Medication changes reported     No    Fall or balance concerns reported    No    Tobacco Cessation No Change    Warm-up and Cool-down Performed on first and last piece of equipment    Resistance Training Performed Yes    VAD Patient? No    PAD/SET Patient? No      Pain Assessment   Currently in Pain? No/denies    Multiple Pain Sites No             Capillary Blood Glucose: No results found for this or any previous visit (from the past 24 hour(s)).    Social History   Tobacco Use  Smoking Status Former   Current packs/day: 0.00   Types: Cigarettes   Quit date: 06/11/1982   Years since quitting: 41.0  Smokeless Tobacco Never  Tobacco Comments   smoking cessation materials not required    Goals Met:  Independence with exercise equipment Exercise tolerated well No report of concerns or symptoms today Strength training completed today  Goals Unmet:  Not Applicable  Comments: Pt able to follow exercise prescription today without complaint.  Will continue to monitor for progression.    Dr. Dina Rich is Medical Director for [facility] Cardiac Rehab

## 2023-06-27 ENCOUNTER — Encounter (HOSPITAL_COMMUNITY): Payer: Medicare HMO

## 2023-06-27 ENCOUNTER — Encounter (HOSPITAL_COMMUNITY)
Admission: RE | Admit: 2023-06-27 | Discharge: 2023-06-27 | Disposition: A | Discharge status: Home / Self Care | Payer: Medicare HMO | Source: Ambulatory Visit | Attending: Cardiology | Admitting: Cardiology

## 2023-06-27 ENCOUNTER — Telehealth: Payer: Self-pay | Admitting: Internal Medicine

## 2023-06-27 DIAGNOSIS — I2129 ST elevation (STEMI) myocardial infarction involving other sites: Secondary | ICD-10-CM

## 2023-06-27 NOTE — Progress Notes (Signed)
Daily Session Note  Patient Details  Name: Angela Rose MRN: [id number] Date of Birth: 1946-10-01 Referring Provider:   Flowsheet Row CARDIAC REHAB PHASE II ORIENTATION from 05/03/2023 in [facility] CARDIAC REHABILITATION  Referring Provider [name] MD  [Attending Cardiologist: Dr. Carolan Clines       Encounter Date: 06/27/2023  Check In:  Session Check In - 06/27/23 1100       Check-In   Supervising physician immediately available to respond to emergencies See telemetry face sheet for immediately available MD    Location [facility]    Staff Present Ross Ludwig, BS, Exercise Physiologist;Debra Laural Benes, RN, BSN;Delorice Bannister, RN;Jessica Dana, MA, RCEP, CCRP, CCET    Virtual Visit No    Medication changes reported     No    Fall or balance concerns reported    No    Tobacco Cessation No Change    Warm-up and Cool-down Performed on first and last piece of equipment    Resistance Training Performed Yes    VAD Patient? No    PAD/SET Patient? No      Pain Assessment   Currently in Pain? No/denies    Multiple Pain Sites No             Capillary Blood Glucose: No results found for this or any previous visit (from the past 24 hour(s)).    Social History   Tobacco Use  Smoking Status Former   Current packs/day: 0.00   Types: Cigarettes   Quit date: 06/11/1982   Years since quitting: 41.0  Smokeless Tobacco Never  Tobacco Comments   smoking cessation materials not required    Goals Met:  Independence with exercise equipment Exercise tolerated well No report of concerns or symptoms today  Goals Unmet:  Not Applicable  Comments: Pt able to follow exercise prescription today without complaint.  Will continue to monitor for progression.    Dr. Dina Rich is Medical Director for [facility] Cardiac Rehab

## 2023-06-27 NOTE — Telephone Encounter (Signed)
Home care delivery called following up on the certificate of necessity  for the automatic blood pressure cuff, will refax again. Their phone # [phone] and fax # [phone]

## 2023-06-28 ENCOUNTER — Telehealth: Payer: Self-pay | Admitting: Internal Medicine

## 2023-06-28 NOTE — Telephone Encounter (Signed)
Awaiting signature from pcp

## 2023-06-28 NOTE — Telephone Encounter (Signed)
Awaiting dr Durwin Nora signature

## 2023-06-28 NOTE — Telephone Encounter (Signed)
[facility] called in on patient behalf. Checking for status of certificate of medical necessity that needs correction  Phone [phone]

## 2023-06-29 ENCOUNTER — Encounter: Payer: Self-pay | Admitting: Podiatry

## 2023-06-29 ENCOUNTER — Ambulatory Visit (INDEPENDENT_AMBULATORY_CARE_PROVIDER_SITE_OTHER): Payer: Medicare HMO | Admitting: Podiatry

## 2023-06-29 ENCOUNTER — Encounter (HOSPITAL_COMMUNITY): Payer: Medicare HMO

## 2023-06-29 ENCOUNTER — Encounter (HOSPITAL_COMMUNITY)
Admission: RE | Admit: 2023-06-29 | Discharge: 2023-06-29 | Disposition: A | Discharge status: Home / Self Care | Payer: Medicare HMO | Source: Ambulatory Visit | Attending: Cardiology | Admitting: Cardiology

## 2023-06-29 DIAGNOSIS — B351 Tinea unguium: Secondary | ICD-10-CM | POA: Diagnosis not present

## 2023-06-29 DIAGNOSIS — E118 Type 2 diabetes mellitus with unspecified complications: Secondary | ICD-10-CM | POA: Diagnosis not present

## 2023-06-29 DIAGNOSIS — M79674 Pain in right toe(s): Secondary | ICD-10-CM

## 2023-06-29 DIAGNOSIS — I2129 ST elevation (STEMI) myocardial infarction involving other sites: Secondary | ICD-10-CM | POA: Diagnosis not present

## 2023-06-29 DIAGNOSIS — M79675 Pain in left toe(s): Secondary | ICD-10-CM | POA: Diagnosis not present

## 2023-06-29 NOTE — Progress Notes (Signed)

## 2023-06-29 NOTE — Progress Notes (Signed)
Daily Session Note  Patient Details  Name: Angela Rose MRN: [id number] Date of Birth: 1946/06/04 Referring Provider:   Flowsheet Row CARDIAC REHAB PHASE II ORIENTATION from 05/03/2023 in [facility] CARDIAC REHABILITATION  Referring Provider [name] MD  [Attending Cardiologist: Dr. Carolan Clines       Encounter Date: 06/29/2023  Check In:  Session Check In - 06/29/23 1039       Check-In   Supervising physician immediately available to respond to emergencies See telemetry face sheet for immediately available MD    Location [facility]    Staff Present Ross Ludwig, BS, Exercise Physiologist;Hillary Coushatta BSN, RN;Jessica Derry, MA, RCEP, CCRP, CCET    Virtual Visit No    Medication changes reported     No    Fall or balance concerns reported    No    Tobacco Cessation No Change    Warm-up and Cool-down Performed on first and last piece of equipment    Resistance Training Performed Yes    VAD Patient? No    PAD/SET Patient? No      Pain Assessment   Currently in Pain? No/denies    Multiple Pain Sites No             Capillary Blood Glucose: No results found for this or any previous visit (from the past 24 hour(s)).    Social History   Tobacco Use  Smoking Status Former   Current packs/day: 0.00   Types: Cigarettes   Quit date: 06/11/1982   Years since quitting: 41.0  Smokeless Tobacco Never  Tobacco Comments   smoking cessation materials not required    Goals Met:  Independence with exercise equipment Exercise tolerated well No report of concerns or symptoms today Strength training completed today  Goals Unmet:  Not Applicable  Comments: Pt able to follow exercise prescription today without complaint.  Will continue to monitor for progression.    Dr. Dina Rich is Medical Director for [facility]

## 2023-07-01 ENCOUNTER — Encounter (HOSPITAL_COMMUNITY): Payer: Medicare HMO

## 2023-07-01 NOTE — Telephone Encounter (Signed)
Home Care delivered called in regard to previous tele message   Awaiting corrections  Question 27 Current height weight Date of onset Primary code used  Call back [phone] Fax [phone]

## 2023-07-04 ENCOUNTER — Encounter (HOSPITAL_COMMUNITY): Payer: Medicare HMO

## 2023-07-04 NOTE — Telephone Encounter (Signed)
Called home care, they will refax paperwork to [place]

## 2023-07-06 ENCOUNTER — Encounter (HOSPITAL_COMMUNITY): Payer: Medicare HMO

## 2023-07-06 ENCOUNTER — Telehealth: Payer: Self-pay | Admitting: Internal Medicine

## 2023-07-06 NOTE — Telephone Encounter (Signed)
Forms have been refaxed

## 2023-07-06 NOTE — Telephone Encounter (Signed)
[facility] called from home care delivery  faxedover certificate of necessity was incomplete asking did provider receive the other  fax ? Asking for a call back # [phone].

## 2023-07-08 ENCOUNTER — Encounter (HOSPITAL_COMMUNITY): Payer: Medicare HMO

## 2023-07-11 ENCOUNTER — Encounter (HOSPITAL_COMMUNITY): Payer: Medicare HMO

## 2023-07-13 ENCOUNTER — Encounter (HOSPITAL_COMMUNITY): Payer: Medicare HMO

## 2023-07-13 DIAGNOSIS — I10 Essential (primary) hypertension: Secondary | ICD-10-CM | POA: Diagnosis not present

## 2023-07-15 ENCOUNTER — Encounter (HOSPITAL_COMMUNITY): Payer: Medicare HMO

## 2023-07-15 ENCOUNTER — Telehealth (HOSPITAL_COMMUNITY): Payer: Self-pay

## 2023-07-15 DIAGNOSIS — I2129 ST elevation (STEMI) myocardial infarction involving other sites: Secondary | ICD-10-CM | POA: Insufficient documentation

## 2023-07-15 NOTE — Telephone Encounter (Signed)
Called patient regarding attendance in cardiac rehab. She says she has been out due to her spouse falling and she was not able to leave him. She says they both have had a URI. She has tested negative for COVID but has not seen a doctor. She was informed her last day would be Friday. She verbalized understanding and said she hopes to feel like coming next week. She has been running a low-grade fever and was told she needed to be without a fever for 24 hous before returning. She verbalized understanding.

## 2023-07-18 ENCOUNTER — Encounter (HOSPITAL_COMMUNITY): Payer: Medicare HMO

## 2023-07-20 ENCOUNTER — Encounter (HOSPITAL_COMMUNITY): Payer: Self-pay | Admitting: *Deleted

## 2023-07-20 ENCOUNTER — Encounter (HOSPITAL_COMMUNITY): Payer: Medicare HMO

## 2023-07-20 DIAGNOSIS — I2129 ST elevation (STEMI) myocardial infarction involving other sites: Secondary | ICD-10-CM

## 2023-07-20 NOTE — Progress Notes (Signed)
Cardiac Individual Treatment Plan  Patient Details  Name: Angela Rose MRN: [id number] Date of Birth: November 14, 1945 Referring Provider:   Flowsheet Row CARDIAC REHAB PHASE II ORIENTATION from 05/03/2023 in [facility] CARDIAC REHABILITATION  Referring Provider [name] MD  [Attending Cardiologist: Dr. Corrie Dandy Branch]       Initial Encounter Date:  Flowsheet Row CARDIAC REHAB PHASE II ORIENTATION from 05/03/2023 in [facility] CARDIAC REHABILITATION  Date 05/03/23       Visit Diagnosis: ST elevation myocardial infarction (STEMI) involving other coronary artery (HCC)  Patient's Home Medications on Admission:  Current Outpatient Medications:    aspirin 81 MG tablet, Take by mouth every evening. , Disp: , Rfl:    azelastine (ASTELIN) 0.1 % nasal spray, Place 1 spray into both nostrils 2 (two) times daily. Use in each nostril as directed, Disp: 30 mL, Rfl: 1   b complex vitamins capsule, Take 1 capsule by mouth daily., Disp: , Rfl:    cetirizine (ZYRTEC) 5 MG tablet, Take 1 tablet (5 mg total) by mouth daily. (Patient not taking: Reported on 04/19/2023), Disp: 30 tablet, Rfl: 0   clopidogrel (PLAVIX) 75 MG tablet, Take 1 tablet (75 mg total) by mouth daily., Disp: 90 tablet, Rfl: 3   dapagliflozin propanediol (FARXIGA) 10 MG TABS tablet, Take 1 tablet (10 mg total) by mouth daily., Disp: 30 tablet, Rfl: 11   Evolocumab (REPATHA) 140 MG/ML SOSY, Inject 140 mg into the skin every 14 (fourteen) days., Disp: 2.1 mL, Rfl: 3   ezetimibe (ZETIA) 10 MG tablet, Take 1 tablet (10 mg total) by mouth daily., Disp: 90 tablet, Rfl: 3   FLUoxetine (PROZAC) 40 MG capsule, Take 1 capsule (40 mg total) by mouth daily., Disp: 90 capsule, Rfl: 3   furosemide (LASIX) 40 MG tablet, Take 1 tablet (40 mg) for 3 lb weight gain overnight. (Patient not taking: Reported on 05/02/2023), Disp: 30 tablet, Rfl: 11   Glucose Blood (BLOOD GLUCOSE TEST STRIPS) STRP, Use to check glucose twice daily, Disp: 100 strip, Rfl:  11   glucose blood test strip, Frequency:PHARMDIR   Dosage:0.0     Instructions:  Note:testing 3-4xqd, DX Code 250.00 Dose: 1, Disp: , Rfl:    insulin degludec (TRESIBA FLEXTOUCH) 100 UNIT/ML FlexTouch Pen, Inject 16 Units into the skin at bedtime., Disp: 4.5 mL, Rfl: 2   Insulin Pen Needle (BD PEN NEEDLE NANO U/F) 32G X 4 MM MISC, Use to check glucose once daily, Disp: 100 each, Rfl: 6   levothyroxine (SYNTHROID) 50 MCG tablet, Take 1 tablet (50 mcg total) by mouth daily before breakfast. (Patient taking differently: Take 50 mcg by mouth at bedtime.), Disp: 90 tablet, Rfl: 3   metFORMIN (GLUCOPHAGE-XR) 500 MG 24 hr tablet, Take 1 tablet (500 mg total) by mouth daily with breakfast. Resume on 01/18/23, Disp: 90 tablet, Rfl: 3   metoprolol succinate (TOPROL-XL) 25 MG 24 hr tablet, Take 1 tablet (25 mg total) by mouth daily., Disp: 90 tablet, Rfl: 3   nitroGLYCERIN (NITROSTAT) 0.4 MG SL tablet, Place 1 tablet (0.4 mg total) under the tongue every 5 (five) minutes as needed for chest pain., Disp: 25 tablet, Rfl: 3   sacubitril-valsartan (ENTRESTO) 24-26 MG, Take 1 tablet by mouth 2 (two) times daily., Disp: 180 tablet, Rfl: 3   sodium chloride (OCEAN) 0.65 % SOLN nasal spray, Place 1 spray into both nostrils as needed for congestion., Disp: 30 mL, Rfl: 0   spironolactone (ALDACTONE) 25 MG tablet, Take 0.5 tablets (12.5 mg total)  by mouth daily., Disp: 45 tablet, Rfl: 3   Study - LIBREXIA-ACS - milvexian 25 mg or placebo tablet (PI-Stuckey), Take 1 tablet by mouth 2 (two) times daily. Take 1 tablet by mouth twice a day with or without food. Bring bottle back to every visit. Please contact [facility] [place] [facility] for any questions or concerns regarding the medication, Disp: 280 tablet, Rfl: 0   Vitamin D, Ergocalciferol, (DRISDOL) 1.25 MG (50000 UNIT) CAPS capsule, Take 1 capsule (50,000 Units total) by mouth every 7 (seven) days., Disp: 10 capsule, Rfl: 1  Past Medical History: Past Medical History:  Diagnosis  Date   Anxiety 07/10/2014   Cancer (HCC) 12/13/2011   melanoma face   CHF (congestive heart failure), NYHA class II, acute on chronic, combined (HCC) 01/14/2023   Chronic kidney disease (CKD), stage III (moderate) (HCC) 02/17/2022   Chronic sinusitis 07/02/2022   Depression 07/10/2014   Diabetes mellitus without complication (HCC) 08/21/2014   Fracture of tibial plateau 12/31/2015   Hyperlipidemia 08/14/2015   Hypertension 09/03/2014   Hypothyroidism, postablative 02/08/2014   Myocardial infarction (HCC) 1993   Tick bites 10/29/2014   Vitamin D deficiency 12/08/2022    Tobacco Use: Social History   Tobacco Use  Smoking Status Former   Current packs/day: 0.00   Types: Cigarettes   Quit date: 06/11/1982   Years since quitting: 41.1  Smokeless Tobacco Never  Tobacco Comments   smoking cessation materials not required    Labs: Review Flowsheet  More data exists      Latest Ref Rng & Units 02/10/2022 05/10/2022 12/06/2022 01/12/2023 05/02/2023  Labs for ITP Cardiac and Pulmonary Rehab  Cholestrol 0 - 200 mg/dL 295  284  132  440  -  LDL (calc) 0 - 99 mg/dL 84  102  725  366  -  HDL-C >40 mg/dL 47  51  51  57  -  Trlycerides <150 mg/dL 440  347  425  956  -  Hemoglobin A1c 4.0 - 5.6 % 7.9  - 9.3  9.4  7.8     Details            Capillary Blood Glucose: Lab Results  Component Value Date   GLUCAP 215 (H) 05/30/2023   GLUCAP 141 (H) 05/13/2023   GLUCAP 144 (H) 05/11/2023   GLUCAP 257 (H) 05/11/2023   GLUCAP 160 (H) 05/06/2023     Exercise Target Goals: Exercise Program Goal: Individual exercise prescription set using results from initial 6 min walk test and THRR while considering  patient's activity barriers and safety.   Exercise Prescription Goal: Starting with aerobic activity 30 plus minutes a day, 3 days per week for initial exercise prescription. Provide home exercise prescription and guidelines that participant acknowledges understanding prior to  discharge.  Activity Barriers & Risk Stratification:  Activity Barriers & Cardiac Risk Stratification - 05/03/23 1247       Activity Barriers & Cardiac Risk Stratification   Activity Barriers Joint Problems;Other (comment);Deconditioning;Muscular Weakness;Balance Concerns;Back Problems;Decreased Ventricular Function    Comments previous L knee injury, buldging disc occassional pain, can't floor currently    Cardiac Risk Stratification High             6 Minute Walk:  6 Minute Walk     Row Name 05/03/23 1420         6 Minute Walk   Phase Initial     Distance 794 feet     Walk Time 6 minutes     # of  Rest Breaks 0     MPH 1.5     METS 1.24     RPE 13     Perceived Dyspnea  2     VO2 Peak 4.37     Symptoms Yes (comment)     Comments chest tightness (3/10), harder to breathe/SOB     Resting HR 56 bpm     Resting BP 132/74     Resting Oxygen Saturation  97 %     Exercise Oxygen Saturation  during 6 min walk 96 %     Max Ex. HR 99 bpm     Max Ex. BP 136/64     2 Minute Post BP 106/62              Oxygen Initial Assessment:   Oxygen Re-Evaluation:   Oxygen Discharge (Final Oxygen Re-Evaluation):   Initial Exercise Prescription:  Initial Exercise Prescription - 05/03/23 1400       Date of Initial Exercise RX and Referring Provider   Date 05/03/23    Referring Provider [name] MD   Attending Cardiologist: Dr. Carolan Clines     Oxygen   Maintain Oxygen Saturation 88% or higher      Treadmill   MPH 1.2    Grade 0    Minutes 15    METs 1.92      NuStep   Level 1    SPM 80    Minutes 15    METs 1.5      Prescription Details   Frequency (times per week) 3    Duration Progress to 30 minutes of continuous aerobic without signs/symptoms of physical distress      Intensity   THRR 40-80% of Max Heartrate 90-124    Ratings of Perceived Exertion 11-13    Perceived Dyspnea 0-4      Progression   Progression Continue to progress workloads to  maintain intensity without signs/symptoms of physical distress.      Resistance Training   Training Prescription Yes    Weight 3 lb    Reps 10-15             Perform Capillary Blood Glucose checks as needed.  Exercise Prescription Changes:   Exercise Prescription Changes     Row Name 05/03/23 1400 05/09/23 1300 05/23/23 1300 06/20/23 1300       Response to Exercise   Blood Pressure (Admit) 132/74 120/60 106/64 94/50    Blood Pressure (Exercise) 136/64 120/60 -- --    Blood Pressure (Exit) 106/62 98/58 114/62 90/48    Heart Rate (Admit) 56 bpm 71 bpm 99 bpm 60 bpm    Heart Rate (Exercise) 99 bpm 82 bpm 100 bpm 90 bpm    Heart Rate (Exit) 62 bpm 69 bpm 70 bpm 70 bpm    Oxygen Saturation (Admit) 97 % -- -- --    Oxygen Saturation (Exercise) 96 % -- -- --    Rating of Perceived Exertion (Exercise) 13 12 13 13     Perceived Dyspnea (Exercise) 2 -- -- --    Symptoms chest tightness 3/10, harder to breathe/SOB -- -- --    Comments walk test results -- -- --    Duration -- Continue with 30 min of aerobic exercise without signs/symptoms of physical distress. Continue with 30 min of aerobic exercise without signs/symptoms of physical distress. Continue with 30 min of aerobic exercise without signs/symptoms of physical distress.    Intensity -- THRR unchanged THRR unchanged THRR unchanged  Progression   Progression -- Continue to progress workloads to maintain intensity without signs/symptoms of physical distress. Continue to progress workloads to maintain intensity without signs/symptoms of physical distress. Continue to progress workloads to maintain intensity without signs/symptoms of physical distress.      Resistance Training   Training Prescription -- Yes Yes Yes    Weight -- 2 1 3     Reps -- 10-15 10-15 10-15      Treadmill   MPH -- 1.2 2 --    Grade -- 0 0 --    Minutes -- 15 15 --    METs -- 1.92 2.53 --      NuStep   Level -- 2 1 4     SPM -- 69 75 79     Minutes -- 15 15 30     METs -- 1.8 1.8 2      Oxygen   Maintain Oxygen Saturation -- 88% or higher 88% or higher 88% or higher             Exercise Comments:   Exercise Goals and Review:   Exercise Goals     Row Name 05/03/23 1424             Exercise Goals   Increase Physical Activity Yes       Intervention Provide advice, education, support and counseling about physical activity/exercise needs.;Develop an individualized exercise prescription for aerobic and resistive training based on initial evaluation findings, risk stratification, comorbidities and participant's personal goals.       Expected Outcomes Short Term: Attend rehab on a regular basis to increase amount of physical activity.;Long Term: Add in home exercise to make exercise part of routine and to increase amount of physical activity.;Long Term: Exercising regularly at least 3-5 days a week.       Increase Strength and Stamina Yes       Intervention Provide advice, education, support and counseling about physical activity/exercise needs.;Develop an individualized exercise prescription for aerobic and resistive training based on initial evaluation findings, risk stratification, comorbidities and participant's personal goals.       Expected Outcomes Short Term: Increase workloads from initial exercise prescription for resistance, speed, and METs.;Short Term: Perform resistance training exercises routinely during rehab and add in resistance training at home;Long Term: Improve cardiorespiratory fitness, muscular endurance and strength as measured by increased METs and functional capacity ( )       Able to understand and use rate of perceived exertion (RPE) scale Yes       Intervention Provide education and explanation on how to use RPE scale       Expected Outcomes Short Term: Able to use RPE daily in rehab to express subjective intensity level;Long Term:  Able to use RPE to guide intensity level when exercising  independently       Able to understand and use Dyspnea scale Yes       Intervention Provide education and explanation on how to use Dyspnea scale       Expected Outcomes Short Term: Able to use Dyspnea scale daily in rehab to express subjective sense of shortness of breath during exertion;Long Term: Able to use Dyspnea scale to guide intensity level when exercising independently       Knowledge and understanding of Target Heart Rate Range (THRR) Yes       Intervention Provide education and explanation of THRR including how the numbers were predicted and where they are located for reference  Expected Outcomes Short Term: Able to state/look up THRR;Long Term: Able to use THRR to govern intensity when exercising independently;Short Term: Able to use daily as guideline for intensity in rehab       Able to check pulse independently Yes       Intervention Provide education and demonstration on how to check pulse in carotid and radial arteries.;Review the importance of being able to check your own pulse for safety during independent exercise       Expected Outcomes Short Term: Able to explain why pulse checking is important during independent exercise;Long Term: Able to check pulse independently and accurately       Understanding of Exercise Prescription Yes       Intervention Provide education, explanation, and written materials on patient's individual exercise prescription       Expected Outcomes Short Term: Able to explain program exercise prescription;Long Term: Able to explain home exercise prescription to exercise independently                Exercise Goals Re-Evaluation :  Exercise Goals Re-Evaluation     Row Name 05/03/23 1425 05/09/23 1348 05/11/23 1112 05/27/23 1347 06/03/23 1102     Exercise Goal Re-Evaluation   Exercise Goals Review Knowledge and understanding of Target Heart Rate Range (THRR);Able to understand and use Dyspnea scale;Understanding of Exercise Prescription --  Increase Physical Activity;Increase Strength and Stamina;Understanding of Exercise Prescription Increase Physical Activity;Understanding of Exercise Prescription;Increase Strength and Stamina Increase Strength and Stamina;Increase Physical Activity;Understanding of Exercise Prescription   Comments Reviewed RPE  and dyspnea scale and program prescription with pt today.  Pt voiced understanding and was given a copy of goals to take home. She is tolerating exercise well but has not increased on her workloads yet. She grips the treadmill when walking she states that it bothers her shoulders and arms Pt states that saw a difference in her strength after her first time in rehab.  She states that feels stronger and has been able to do more around the house such as cooking and cleaning.  Pt stated that "this is the best I have felt in a long time."  Pt is not exercising at home right now.  She states that the heat prevents her from walking outside, but she is interested in going to the [facility] to exercise. [place] has increased her speed on the treadmill to 2.0. Will continue to monitor and progress as able. Scotlyn has been complaning of lower back pain due to a disc that she has. She is not using the treadmill to see if it's causing her to have the back pain after walking prolonged time on it. She said that her legs have gotten stronger. She is able to use her steps at home without needing to hold on. Flonnie Hailstone is going to start walking with her partner at home. Went over exercising at home and sent home exercise packet.   Expected Outcomes Short: Use RPE daily to regulate intensity.  Long: Follow program prescription -- Short term:  Go over home exercise plan  Long term:  Pt will start exercising at [facility] or home Short term: increase level on the NuStep in the next week   long term: continue to attend cardiac rehab Short term: increase level on NuStep to help build endrance   long term: continue to attend cardiac rehab and exercise  at home             Discharge Exercise Prescription (Final Exercise Prescription Changes):  Exercise Prescription Changes - 06/20/23 1300       Response to Exercise   Blood Pressure (Admit) 94/50    Blood Pressure (Exit) 90/48    Heart Rate (Admit) 60 bpm    Heart Rate (Exercise) 90 bpm    Heart Rate (Exit) 70 bpm    Rating of Perceived Exertion (Exercise) 13    Duration Continue with 30 min of aerobic exercise without signs/symptoms of physical distress.    Intensity THRR unchanged      Progression   Progression Continue to progress workloads to maintain intensity without signs/symptoms of physical distress.      Resistance Training   Training Prescription Yes    Weight 3    Reps 10-15      NuStep   Level 4    SPM 79    Minutes 30    METs 2      Oxygen   Maintain Oxygen Saturation 88% or higher             Nutrition:  Target Goals: Understanding of nutrition guidelines, daily intake of sodium 1500mg , cholesterol 200mg , calories 30% from fat and 7% or less from saturated fats, daily to have 5 or more servings of fruits and vegetables.  Biometrics:  Pre Biometrics - 05/03/23 1425       Pre Biometrics   Height 5' 0.25" (1.53 m)    Weight 160 lb 12.8 oz (72.9 kg)    Waist Circumference 36.5 inches    Hip Circumference 39.5 inches    Waist to Hip Ratio 0.92 %    BMI (Calculated) 31.16    Triceps Skinfold 20 mm    % Body Fat 41.1 %    Grip Strength 19.6 kg    Single Leg Stand 2 seconds              Nutrition Therapy Plan and Nutrition Goals:  Nutrition Therapy & Goals - 05/03/23 1307       Intervention Plan   Intervention Prescribe, educate and counsel regarding individualized specific dietary modifications aiming towards targeted core components such as weight, hypertension, lipid management, diabetes, heart failure and other comorbidities.;Nutrition handout(s) given to patient.    Expected Outcomes Short Term Goal: Understand basic  principles of dietary content, such as calories, fat, sodium, cholesterol and nutrients.             Nutrition Assessments:  MEDIFICTS Score Key: >=70 Need to make dietary changes  40-70 Heart Healthy Diet <= 40 Therapeutic Level Cholesterol Diet  Flowsheet Row CARDIAC REHAB PHASE II ORIENTATION from 05/03/2023 in [facility] CARDIAC REHABILITATION  Picture Your Plate Total Score on Admission 71      Picture Your Plate Scores: <16 Unhealthy dietary pattern with much room for improvement. 41-50 Dietary pattern unlikely to meet recommendations for good health and room for improvement. 51-60 More healthful dietary pattern, with some room for improvement.  >60 Healthy dietary pattern, although there may be some specific behaviors that could be improved.    Nutrition Goals Re-Evaluation:  Nutrition Goals Re-Evaluation     Row Name 05/11/23 1143 06/03/23 1115           Goals   Nutrition Goal Heart healthy diet Heart healthy diet      Comment Pt states that her diet has improved and she has lost quite a bit of weight.  Pt does not eat much red meat because she prefers to eat chicken.  She enjoys eating a small bowl of icecream  a few times a week; otherwise, she tries to stay away from sweets except for fresh fruit. Pt diet continues to improve. She states that her partner found out he has fatty liver so they have started to eat veggies at dinner and cutting out red meats. She said she has lost a pound with this but has noticed her sugar is low in the morning.      Expected Outcome Short term:  Pt is happy with her eating habits-maintain focus on healthy eating               Long term:  Pt will follow a heart healthy diet. Short term: pt going to eat more at night to watch sugar   long term: continue to follow healthy diet               Nutrition Goals Discharge (Final Nutrition Goals Re-Evaluation):  Nutrition Goals Re-Evaluation - 06/03/23 1115       Goals   Nutrition Goal  Heart healthy diet    Comment Pt diet continues to improve. She states that her partner found out he has fatty liver so they have started to eat veggies at dinner and cutting out red meats. She said she has lost a pound with this but has noticed her sugar is low in the morning.    Expected Outcome Short term: pt going to eat more at night to watch sugar   long term: continue to follow healthy diet             Psychosocial: Target Goals: Acknowledge presence or absence of significant depression and/or stress, maximize coping skills, provide positive support system. Participant is able to verbalize types and ability to use techniques and skills needed for reducing stress and depression.  Initial Review & Psychosocial Screening:  Initial Psych Review & Screening - 05/03/23 1259       Initial Review   Current issues with Current Stress Concerns;Current Depression;Current Anxiety/Panic;Current Sleep Concerns    Source of Stress Concerns Family;Chronic Illness;Unable to perform yard/household activities;Unable to participate in former interests or hobbies    Comments ex-husband can be stressful sometimes, heart health was unexpected and trying to recover and not be able to go and do, situational depression/anxiety, partner is a [employer] loss adjuster, got bed bugs last week from something from [employer] and now it keeps her up at night looking out for things, PHQ is a 10, participating in heart study      Family Dynamics   Good Support System? Yes   significant other of 21 yrs, 3 grown kids     Barriers   Psychosocial barriers to participate in program Psychosocial barriers identified (see note);The patient should benefit from training in stress management and relaxation.      Screening Interventions   Interventions Encouraged to exercise;Provide feedback about the scores to participant;To provide support and resources with identified psychosocial needs    Expected Outcomes Short Term goal: Utilizing  psychosocial counselor, staff and physician to assist with identification of specific Stressors or current issues interfering with healing process. Setting desired goal for each stressor or current issue identified.;Long Term Goal: Stressors or current issues are controlled or eliminated.;Short Term goal: Identification and review with participant of any Quality of Life or Depression concerns found by scoring the questionnaire.;Long Term goal: The participant improves quality of Life and PHQ9 Scores as seen by post scores and/or verbalization of changes             Quality  of Life Scores:  Quality of Life - 05/03/23 1426       Quality of Life   Select Quality of Life      Quality of Life Scores   Health/Function Pre 13.25 %    Socioeconomic Pre 11.83 %    Psych/Spiritual Pre 13.17 %    Family Pre 18.2 %    GLOBAL Pre 13.76 %            Scores of 19 and below usually indicate a poorer quality of life in these areas.  A difference of  2-3 points is a clinically meaningful difference.  A difference of 2-3 points in the total score of the Quality of Life Index has been associated with significant improvement in overall quality of life, self-image, physical symptoms, and general health in studies assessing change in quality of life.  PHQ-9: Review Flowsheet  More data exists      06/14/2023 05/03/2023 03/23/2023 01/05/2023 12/08/2022  Depression screen PHQ 2/9  Decreased Interest 0 0 1 1 2   Down, Depressed, Hopeless 0 2 1 1 1   PHQ - 2 Score 0 2 2 2 3   Altered sleeping - 3 2 - 0  Tired, decreased energy - 3 0 - 1  Change in appetite - 0 0 - 0  Feeling bad or failure about yourself  - 0 0 - 0  Trouble concentrating - 1 0 - 0  Moving slowly or fidgety/restless - 1 0 - 0  Suicidal thoughts - 0 0 - 0  PHQ-9 Score - 10 4 - 4  Difficult doing work/chores - Very difficult - - Somewhat difficult    Details           Interpretation of Total Score  Total Score Depression Severity:   1-4 = Minimal depression, 5-9 = Mild depression, 10-14 = Moderate depression, 15-19 = Moderately severe depression, 20-27 = Severe depression   Psychosocial Evaluation and Intervention:  Psychosocial Evaluation - 05/03/23 1429       Psychosocial Evaluation & Interventions   Interventions Stress management education;Relaxation education;Encouraged to exercise with the program and follow exercise prescription    Comments Merve is coming into cardiac rehab after a STEMI in April.  She has a previous heart history including MI and CABG.  She was unsure why she suddenly had a heart attack and went into heart failure. She was excited to be able to come off of her LifeVest last week after her TEE showed improvement!!  She has a great support system in her partner of 21 years.  They look out for each other but he is a bad influence on her eating, but she has recently lost almost 50 lb since taking over her diet herself!!  She wants to get moving again and to feel better again.  She wants to be able to walk without having to stop and rest for her breath and chest pain.  She normally is a good sleeper, but recently they got a bout of bed bugs and she wakes in the night hunting for them.  She is hoping to be rid of them soon!  She did not have any traces of bed bugs on her today and was encouraged to continue to launder her bedding to clear them up.  She is retired and on a limited monthly income, but makes the best of it.  One of her stressors is her exhusband when she has to deal with him from time to time.  She does  have some occassional depression symptoms especially since she isn't able to go and do like she would like to currently.  Her PHQ is currently a 10 and we will reassess in a month.  Currently attributted to not sleeping well and not haveing the energy that she would like to have.  She also wants to build up her strength and stamina overall.  She has no barriers to attending rehab other than her fatigue  levels.  She is eage to get started to get to feeling better.    Expected Outcomes Short: Attend rehab to build up strength and stamina Long: Continue to work on improving sleep overall.    Continue Psychosocial Services  Follow up required by staff             Psychosocial Re-Evaluation:  Psychosocial Re-Evaluation     Row Name 05/06/23 1109 05/11/23 1121 06/03/23 1111         Psychosocial Re-Evaluation   Current issues with Current Stress Concerns Current Stress Concerns;Current Depression Current Stress Concerns;Current Depression     Comments Spoke to pt about bedbugs again.  She says they have not seen any over the last two nights.  Asked her to keep [place] updated. Pt is sleeping better since she and her partner have not seen any bedbugs for a few weeks.  She does still have some depression related to her diagnosis of heart failure, but she states that the depression has improved since she has more energy to do activities around the house.  She does worry since her father died of heart disease at an early age.  She still takes Prozac for depression, but she states that she would like to either get off it or try another anti depressant.  We spoke about talking to her primary doctor or his nurse about the Prozac. Pt has been staying up late watching TV so her sleep has been off recently. She continues to not see bedbugs. She likes to get up before her partner in the morning to have her coffee and quiet time to herself. She continues to have depression related to her diagosis.     Expected Outcomes Short: Keep watch on bedbugs Long: Completely gone Short term:  Continue to have a positive attitude and do more activities around the house           Long term:  Once she has established rapport with her new primary care MD  talk to him about Prozac Short term:  Continue to have a positive attitude and do more activities around the house           Long term:  continue to identify concenrs and have  positive attitude     Interventions Stress management education;Encouraged to attend Cardiac Rehabilitation for the exercise Stress management education;Relaxation education;Encouraged to attend Cardiac Rehabilitation for the exercise Stress management education;Relaxation education;Encouraged to attend Cardiac Rehabilitation for the exercise     Continue Psychosocial Services  Follow up required by staff Follow up required by staff Follow up required by staff       Initial Review   Source of Stress Concerns -- Chronic Illness;Family --              Psychosocial Discharge (Final Psychosocial Re-Evaluation):  Psychosocial Re-Evaluation - 06/03/23 1111       Psychosocial Re-Evaluation   Current issues with Current Stress Concerns;Current Depression    Comments Pt has been staying up late watching TV so her sleep has been off  recently. She continues to not see bedbugs. She likes to get up before her partner in the morning to have her coffee and quiet time to herself. She continues to have depression related to her diagosis.    Expected Outcomes Short term:  Continue to have a positive attitude and do more activities around the house           Long term:  continue to identify concenrs and have positive attitude    Interventions Stress management education;Relaxation education;Encouraged to attend Cardiac Rehabilitation for the exercise    Continue Psychosocial Services  Follow up required by staff             Vocational Rehabilitation: Provide vocational rehab assistance to qualifying candidates.   Vocational Rehab Evaluation & Intervention:  Vocational Rehab - 05/03/23 1258       Initial Vocational Rehab Evaluation & Intervention   Assessment shows need for Vocational Rehabilitation No   retired            Education: Education Goals: Education classes will be provided on a weekly basis, covering required topics. Participant will state understanding/return demonstration of  topics presented.  Learning Barriers/Preferences:  Learning Barriers/Preferences - 05/03/23 1247       Learning Barriers/Preferences   Learning Barriers Hearing   R hearing aids, L deaf   Learning Preferences None             Education Topics: Hypertension, Hypertension Reduction -Define heart disease and high blood pressure. Discus how high blood pressure affects the body and ways to reduce high blood pressure.   Exercise and Your Heart -Discuss why it is important to exercise, the FITT principles of exercise, normal and abnormal responses to exercise, and how to exercise safely.   Angina -Discuss definition of angina, causes of angina, treatment of angina, and how to decrease risk of having angina.   Cardiac Medications -Review what the following cardiac medications are used for, how they affect the body, and side effects that may occur when taking the medications.  Medications include Aspirin, Beta blockers, calcium channel blockers, ACE Inhibitors, angiotensin receptor blockers, diuretics, digoxin, and antihyperlipidemics. Flowsheet Row CARDIAC REHAB PHASE II EXERCISE from 06/22/2023 in [place] CARDIAC REHABILITATION  Date 05/04/23  Educator HB  Instruction Review Code 1- Verbalizes Understanding       Congestive Heart Failure -Discuss the definition of CHF, how to live with CHF, the signs and symptoms of CHF, and how keep track of weight and sodium intake. Flowsheet Row CARDIAC REHAB PHASE II EXERCISE from 06/22/2023 in [place] CARDIAC REHABILITATION  Date 06/22/23  Educator [facility]  Instruction Review Code 2- Demonstrated Understanding       Heart Disease and Intimacy -Discus the effect sexual activity has on the heart, how changes occur during intimacy as we age, and safety during sexual activity. Flowsheet Row CARDIAC REHAB PHASE II EXERCISE from 06/22/2023 in [place] CARDIAC REHABILITATION  Date 05/18/23  Educator [facility]  Instruction Review Code 1-  Verbalizes Understanding       Smoking Cessation / COPD -Discuss different methods to quit smoking, the health benefits of quitting smoking, and the definition of COPD. Flowsheet Row CARDIAC REHAB PHASE II EXERCISE from 06/22/2023 in [place] CARDIAC REHABILITATION  Date 06/15/23  Educator jh  Instruction Review Code 1- Verbalizes Understanding       Nutrition I: Fats -Discuss the types of cholesterol, what cholesterol does to the heart, and how cholesterol levels can be controlled. Flowsheet Row CARDIAC REHAB  PHASE II EXERCISE from 06/22/2023 in [place] [facility]  Date 05/25/23  Educator [facility]  Instruction Review Code 1- Verbalizes Understanding       Nutrition II: Labels -Discuss the different components of food labels and how to read food label Flowsheet Row CARDIAC REHAB PHASE II EXERCISE from 06/22/2023 in [place] CARDIAC REHABILITATION  Date 05/25/23  Educator [facility]  Instruction Review Code 1- Verbalizes Understanding       Heart Parts/Heart Disease and PAD -Discuss the anatomy of the heart, the pathway of blood circulation through the heart, and these are affected by heart disease.   Stress I: Signs and Symptoms -Discuss the causes of stress, how stress may lead to anxiety and depression, and ways to limit stress.   Stress II: Relaxation -Discuss different types of relaxation techniques to limit stress.   Warning Signs of Stroke / TIA -Discuss definition of a stroke, what the signs and symptoms are of a stroke, and how to identify when someone is having stroke.   Knowledge Questionnaire Score:  Knowledge Questionnaire Score - 05/03/23 1309       Knowledge Questionnaire Score   Pre Score 22/24             Core Components/Risk Factors/Patient Goals at Admission:  Personal Goals and Risk Factors at Admission - 05/03/23 1258       Core Components/Risk Factors/Patient Goals on Admission    Weight Management Yes;Obesity;Weight  Maintenance;Weight Loss    Intervention Weight Management: Develop a combined nutrition and exercise program designed to reach desired caloric intake, while maintaining appropriate intake of nutrient and fiber, sodium and fats, and appropriate energy expenditure required for the weight goal.;Weight Management: Provide education and appropriate resources to help participant work on and attain dietary goals.;Weight Management/Obesity: Establish reasonable short term and long term weight goals.;Obesity: Provide education and appropriate resources to help participant work on and attain dietary goals.    Admit Weight 160 lb 12.8 oz (72.9 kg)    Goal Weight: Short Term 155 lb (70.3 kg)    Goal Weight: Long Term 155 lb (70.3 kg)    Expected Outcomes Short Term: Continue to assess and modify interventions until short term weight is achieved;Long Term: Adherence to nutrition and physical activity/exercise program aimed toward attainment of established weight goal;Weight Loss: Understanding of general recommendations for a balanced deficit meal plan, which promotes 1-2 lb weight loss per week and includes a negative energy balance of [phone] kcal/d;Understanding recommendations for meals to include 15-35% energy as protein, 25-35% energy from fat, 35-60% energy from carbohydrates, less than 200mg  of dietary cholesterol, 20-35 gm of total fiber daily;Understanding of distribution of calorie intake throughout the day with the consumption of 4-5 meals/snacks;Weight Maintenance: Understanding of the daily nutrition guidelines, which includes 25-35% calories from fat, 7% or less cal from saturated fats, less than 200mg  cholesterol, less than 1.5gm of sodium, & 5 or more servings of fruits and vegetables daily    Diabetes Yes    Intervention Provide education about signs/symptoms and action to take for hypo/hyperglycemia.;Provide education about proper nutrition, including hydration, and aerobic/resistive exercise  prescription along with prescribed medications to achieve blood glucose in normal ranges: Fasting glucose 65-99 mg/dL    Expected Outcomes Short Term: Participant verbalizes understanding of the signs/symptoms and immediate care of hyper/hypoglycemia, proper foot care and importance of medication, aerobic/resistive exercise and nutrition plan for blood glucose control.;Long Term: Attainment of HbA1C < 7%.    Heart Failure Yes    Intervention  Provide a combined exercise and nutrition program that is supplemented with education, support and counseling about heart failure. Directed toward relieving symptoms such as shortness of breath, decreased exercise tolerance, and extremity edema.    Expected Outcomes Improve functional capacity of life;Short term: Attendance in program 2-3 days a week with increased exercise capacity. Reported lower sodium intake. Reported increased fruit and vegetable intake. Reports medication compliance.;Short term: Daily weights obtained and reported for increase. Utilizing diuretic protocols set by physician.;Long term: Adoption of self-care skills and reduction of barriers for early signs and symptoms recognition and intervention leading to self-care maintenance.    Hypertension Yes    Intervention Provide education on lifestyle modifcations including regular physical activity/exercise, weight management, moderate sodium restriction and increased consumption of fresh fruit, vegetables, and low fat dairy, alcohol moderation, and smoking cessation.;Monitor prescription use compliance.    Expected Outcomes Short Term: Continued assessment and intervention until BP is < 140/33mm HG in hypertensive participants. < 130/62mm HG in hypertensive participants with diabetes, heart failure or chronic kidney disease.;Long Term: Maintenance of blood pressure at goal levels.    Lipids Yes    Intervention Provide education and support for participant on nutrition & aerobic/resistive exercise along  with prescribed medications to achieve LDL 70mg , HDL >40mg .    Expected Outcomes Short Term: Participant states understanding of desired cholesterol values and is compliant with medications prescribed. Participant is following exercise prescription and nutrition guidelines.;Long Term: Cholesterol controlled with medications as prescribed, with individualized exercise RX and with personalized nutrition plan. Value goals: LDL < 70mg , HDL > 40 mg.             Core Components/Risk Factors/Patient Goals Review:   Goals and Risk Factor Review     Row Name 05/11/23 1152 06/03/23 1120           Core Components/Risk Factors/Patient Goals Review   Personal Goals Review Weight Management/Obesity;Heart Failure;Hypertension;Lipids;Diabetes Weight Management/Obesity;Diabetes;Hypertension      Review Pt checks her blood sugar 1-2 times a day.  She states that she forgets a lot a night but she usually remembers to check it in the morning.  She does not check her BP or her weight at home, but she does check for swelling in her feet/legs daily.  We talked about the importance of daily weights, as well as checking her BP at home since she has a machine.  Since her partner also needs to check his BP at home we talked about them trying to check their BP together. Pt said that due to starting to eat veggie for dinner she has noticed her CBS in the mornings have been 66. She is going to eat a little more with dinner to make sure her CBG does not drop in the moring. She is going to get a new blood pressure cuff to start taking it at home. She and her partner will take it together to keep accountable.      Expected Outcomes Short term:  Pt will start checking her weight daily and also checking her BP at home.            Long term:  Better understanding of heart failure Short term: check BP daily   long term: continue to watch sugar and montior BP               Core Components/Risk Factors/Patient Goals at  Discharge (Final Review):   Goals and Risk Factor Review - 06/03/23 1120       Core  Components/Risk Factors/Patient Goals Review   Personal Goals Review Weight Management/Obesity;Diabetes;Hypertension    Review Pt said that due to starting to eat veggie for dinner she has noticed her CBS in the mornings have been 66. She is going to eat a little more with dinner to make sure her CBG does not drop in the moring. She is going to get a new blood pressure cuff to start taking it at home. She and her partner will take it together to keep accountable.    Expected Outcomes Short term: check BP daily   long term: continue to watch sugar and montior BP             ITP Comments:  ITP Comments     Row Name 05/03/23 1418 05/04/23 1125 05/25/23 0828 06/22/23 0821 07/15/23 1516   ITP Comments Patient attend orientation today.  Patient is attendingCardiac Rehabilitation Program.  Documentation for diagnosis can be found in [facility] encounter [facility]/22/23.  Reviewed medical chart, RPE/RPD, gym safety, and program guidelines.  Patient was fitted to equipment they will be using during rehab.  Patient is scheduled to start exercise on 05/04/23.   Initial ITP created and sent for review and signature by Dr. Dina Rich, Medical Director for Cardiac Rehabilitation Program. First full day of exercise!  Patient was oriented to gym and equipment including functions, settings, policies, and procedures.  Patient's individual exercise prescription and treatment plan were reviewed.  All starting workloads were established based on the results of the 6 minute walk test done at initial orientation visit.  The plan for exercise progression was also introduced and progression will be customized based on patient's performance and goals 30 day review completed. ITP sent to Dr. Dina Rich, Medical Director of Cardiac Rehab. Continue with ITP unless changes are made by physician. 30 day review completed. ITP sent to Dr. Dina Rich, Medical Director of Cardiac Rehab. Continue with ITP unless changes are made by physician. Called patient regarding attendance. She says she has been out due to her spouse falling and she was not able to leave him. She says they both have had a URI. She has tested negative for COVID but has not seen a doctor. She was informed her last day would be Friday. She verbalized understanding and said she hopes to feel like coming next week. She has been running a low-grade fever and was told she needed to be without a fever for 24 hous before returning. She verbalized understanding.    Row Name 07/20/23 0843           ITP Comments 30 day review completed. ITP sent to Dr. Dina Rich, Medical Director of Cardiac Rehab. Continue with ITP unless changes are made by physician.  Pt has not attended since 06/29/23. Unable to assess for goals this round.                Comments: 30 day review

## 2023-07-21 NOTE — Telephone Encounter (Signed)
Patient calling says [facility] is needing confirmation from MD to send in depend diapers for patient- says she cannot afford to buy them from the store. Please advise Thank you

## 2023-07-22 ENCOUNTER — Encounter (HOSPITAL_COMMUNITY): Payer: Medicare HMO

## 2023-07-22 ENCOUNTER — Encounter (HOSPITAL_COMMUNITY)
Admission: RE | Admit: 2023-07-22 | Discharge: 2023-07-22 | Disposition: A | Discharge status: Home / Self Care | Payer: Medicare HMO | Source: Ambulatory Visit | Attending: Cardiology | Admitting: Cardiology

## 2023-07-22 DIAGNOSIS — I2129 ST elevation (STEMI) myocardial infarction involving other sites: Secondary | ICD-10-CM

## 2023-07-22 NOTE — Progress Notes (Addendum)
Daily Session Note  Patient Details  Name: Angela Rose MRN: [id number] Date of Birth: 1946/03/12 Referring Provider:   Flowsheet Row CARDIAC REHAB PHASE II ORIENTATION from 05/03/2023 in [facility] C[facility]AC REHABILITATION  Referring Provider [name] MD  [Attending Cardiologist: Dr. Carolan Clines       Encounter Date: 07/22/2023  Check In:  Session Check In - 07/22/23 1045       Check-In   Supervising physician immediately available to respond to emergencies See telemetry face sheet for immediately available MD    Location [facility]    Staff Present Ross Ludwig, BS, Exercise Physiologist;Siddhant Hashemi BSN, RN;Other    Virtual Visit No    Medication changes reported     No    Fall or balance concerns reported    No    Tobacco Cessation No Change    Warm-up and Cool-down Performed on first and last piece of equipment    Resistance Training Performed Yes    VAD Patient? No    PAD/SET Patient? No      Pain Assessment   Currently in Pain? No/denies    Multiple Pain Sites No             Capillary Blood Glucose: No results found for this or any previous visit (from the past 24 hour(s)).    Social History   Tobacco Use  Smoking Status Former   Current packs/day: 0.00   Types: Cigarettes   Quit date: 06/11/1982   Years since quitting: 41.1  Smokeless Tobacco Never  Tobacco Comments   smoking cessation materials not required    Goals Met:  Independence with exercise equipment Exercise tolerated well No report of concerns or symptoms today Strength training completed today  Goals Unmet:  Not Applicable  Comments:  Angela Rose graduated today from  rehab with 29 sessions completed.  Details of the patient's exercise prescription and what She needs to do in order to continue the prescription and progress were discussed with patient.  Patient was given a copy of prescription and goals.  Patient verbalized understanding. Angela Rose plans to  continue to exercise by by walking/ exercise at homw.

## 2023-07-25 ENCOUNTER — Encounter (HOSPITAL_COMMUNITY): Payer: Medicare HMO

## 2023-07-25 NOTE — Telephone Encounter (Signed)
They are refaxing the form for [place] to complete for her pullups

## 2023-07-27 ENCOUNTER — Encounter (HOSPITAL_COMMUNITY): Payer: Medicare HMO

## 2023-07-28 VITALS — BP 121/60 | HR 58

## 2023-07-28 DIAGNOSIS — Z006 Encounter for examination for normal comparison and control in clinical research program: Secondary | ICD-10-CM

## 2023-07-28 MED ORDER — STUDY - LIBREXIA-ACS - MILVEXIAN 25 MG OR PLACEBO TABLET (PI-STUCKEY): 1.0000 | ORAL_TABLET | Freq: Two times a day (BID) | ORAL | 0 refills | Status: DC

## 2023-07-28 NOTE — Research (Cosign Needed Addendum)
 LIBREXIA ACS 26 WEEK VISIT     [facility] Hematology/Chemistry drawn: [x] Yes [] No   PK/Biomarkers drawn: [x] Yes [] No Subject held morning dose of IP prior to PK draw. Last dose of IP prior to PK draw is 16/Oct/2024 @ 2100.   EQ-5D-5L Assessment Completed? [x] Yes [] No Reason not done: [] Subject Forgot [] Subject too ill [] Subject refused [] Technical failure [] Other If Other, Specify Collection Date: 17/Oct/2024   Medication Kit Accountability Dispensed Status [x] Dispensed [] Dispensed In Error [] Not Dispensed If 'Not Dispensed', provide reason: [] Medical Decision [] Medication Kit not available or damaged [] Study medication discontinued [] Visit skipped or administration skipped [] Medication kit dispensed in error Date Dispensed: Amount Dispensed: 17/Oct/2024 [id number] (Milvexian) 25mg  or Placebo -   [phone],  [phone],  [phone],  [phone]  Return Status: [] Damaged/Returned by subject [] Missing/Not returned by subject [x] Returned by subject If not returned reasons: [] Forgot [] Lost Date Returned: 17/Oct/2024 Amount Returned:  [id number] (Milvexian) 25mg  or Placebo -   [phone], 70 tablets  [phone], 0 tablets  [phone], 17 tablets   [phone], 0 tablets   Compliance%: 97   Were any suspected endpoint events or adverse events experienced? [] Yes [x] No   Current Outpatient Medications:    aspirin  81 MG tablet, Take by mouth every evening. , Disp: , Rfl:    clopidogrel  (PLAVIX ) 75 MG tablet, Take 1 tablet (75 mg total) by mouth daily., Disp: 90 tablet, Rfl: 3   dapagliflozin  propanediol (FARXIGA ) 10 MG TABS tablet, Take 1 tablet (10 mg total) by mouth daily., Disp: 30 tablet, Rfl: 11   Evolocumab  (REPATHA ) 140 MG/ML SOSY, Inject 140 mg into the skin every 14 (fourteen) days., Disp: 2.1 mL, Rfl: 3   ezetimibe  (ZETIA ) 10 MG tablet, Take 1 tablet (10 mg total) by mouth daily., Disp: 90 tablet, Rfl: 3   FLUoxetine  (PROZAC ) 40 MG capsule, Take 1 capsule (40 mg  total) by mouth daily., Disp: 90 capsule, Rfl: 3   furosemide  (LASIX ) 40 MG tablet, Take 1 tablet (40 mg) for 3 lb weight gain overnight., Disp: 30 tablet, Rfl: 11   Glucose Blood (BLOOD GLUCOSE TEST STRIPS) STRP, Use to check glucose twice daily, Disp: 100 strip, Rfl: 11   glucose blood test strip, Frequency:PHARMDIR   Dosage:0.0     Instructions:  Note:testing 3-4xqd, DX Code 250.00 Dose: 1, Disp: , Rfl:    insulin  degludec (TRESIBA  FLEXTOUCH) 100 UNIT/ML FlexTouch Pen, Inject 16 Units into the skin at bedtime., Disp: 4.5 mL, Rfl: 2   Insulin  Pen Needle (BD PEN NEEDLE NANO U/F) 32G X 4 MM MISC, Use to check glucose once daily, Disp: 100 each, Rfl: 6   levothyroxine  (SYNTHROID ) 50 MCG tablet, Take 1 tablet (50 mcg total) by mouth daily before breakfast. (Patient taking differently: Take 50 mcg by mouth at bedtime.), Disp: 90 tablet, Rfl: 3   metFORMIN  (GLUCOPHAGE -XR) 500 MG 24 hr tablet, Take 1 tablet (500 mg total) by mouth daily with breakfast. Resume on 01/18/23, Disp: 90 tablet, Rfl: 3   metoprolol  succinate (TOPROL -XL) 25 MG 24 hr tablet, Take 1 tablet (25 mg total) by mouth daily., Disp: 90 tablet, Rfl: 3   nitroGLYCERIN  (NITROSTAT ) 0.4 MG SL tablet, Place 1 tablet (0.4 mg total) under the tongue every 5 (five) minutes as needed for chest pain., Disp: 25 tablet, Rfl: 3   sacubitril -valsartan  (ENTRESTO ) 24-26 MG, Take 1 tablet by mouth 2 (two) times daily., Disp: 180 tablet, Rfl: 3   spironolactone  (ALDACTONE ) 25 MG tablet, Take 0.5 tablets (12.5 mg total) by mouth daily., Disp: 45 tablet, Rfl:  3   Study - LIBREXIA-ACS - milvexian 25 mg or placebo tablet (PI-Stuckey), Take 1 tablet by mouth 2 (two) times daily. Take 1 tablet by mouth twice a day with or without food. Bring bottle back to every visit. Please contact [facility] for any questions or concerns regarding the medication, Disp: 280 tablet, Rfl: 0   azelastine  (ASTELIN ) 0.1 % nasal spray, Place 1 spray into both nostrils 2 (two)  times daily. Use in each nostril as directed (Patient not taking: Reported on 07/28/2023), Disp: 30 mL, Rfl: 1   b complex vitamins capsule, Take 1 capsule by mouth daily. (Patient not taking: Reported on 07/28/2023), Disp: , Rfl:    cetirizine  (ZYRTEC ) 5 MG tablet, Take 1 tablet (5 mg total) by mouth daily. (Patient not taking: Reported on 04/19/2023), Disp: 30 tablet, Rfl: 0   sodium chloride  (OCEAN) 0.65 % SOLN nasal spray, Place 1 spray into both nostrils as needed for congestion. (Patient not taking: Reported on 07/28/2023), Disp: 30 mL, Rfl: 0   Vitamin D , Ergocalciferol , (DRISDOL ) 1.25 MG (50000 UNIT) CAPS capsule, Take 1 capsule (50,000 Units total) by mouth every 7 (seven) days. (Patient not taking: Reported on 07/28/2023), Disp: 10 capsule, Rfl: 1   Are there any labs that are clinically significant?  Yes [x]  OR No[]   Reduced GFR is chronic.  Seeing Dr. Alvan.

## 2023-07-28 NOTE — Progress Notes (Signed)
Discharge Progress Report  Patient Details  Name: Angela Rose MRN: [id number] Date of Birth: 03/27/1946 Referring Provider:   Flowsheet Row CARDIAC REHAB PHASE II ORIENTATION from 05/03/2023 in [facility]  Referring Provider [name] MD  [Attending Cardiologist: Dr. Corrie Dandy Branch]        Number of Visits: 29  Reason for Discharge:  Patient reached a stable level of exercise. Patient independent in their exercise. Patient has met program and personal goals.  Smoking History:  Social History   Tobacco Use  Smoking Status Former   Current packs/day: 0.00   Types: Cigarettes   Quit date: 06/11/1982   Years since quitting: 41.1  Smokeless Tobacco Never  Tobacco Comments   smoking cessation materials not required    Diagnosis:  ST elevation myocardial infarction (STEMI) involving other coronary artery [facility])  Initial Exercise Prescription:  Initial Exercise Prescription - 05/03/23 1400       Date of Initial Exercise RX and Referring Provider   Date 05/03/23    Referring Provider [name] MD   Attending Cardiologist: Dr. Carolan Clines     Oxygen   Maintain Oxygen Saturation 88% or higher      Treadmill   MPH 1.2    Grade 0    Minutes 15    METs 1.92      NuStep   Level 1    SPM 80    Minutes 15    METs 1.5      Prescription Details   Frequency (times per week) 3    Duration Progress to 30 minutes of continuous aerobic without signs/symptoms of physical distress      Intensity   THRR 40-80% of Max Heartrate 90-124    Ratings of Perceived Exertion 11-13    Perceived Dyspnea 0-4      Progression   Progression Continue to progress workloads to maintain intensity without signs/symptoms of physical distress.      Resistance Training   Training Prescription Yes    Weight 3 lb    Reps 10-15             Discharge Exercise Prescription (Final Exercise Prescription Changes):  Exercise Prescription Changes - 06/20/23  1300       Response to Exercise   Blood Pressure (Admit) 94/50    Blood Pressure (Exit) 90/48    Heart Rate (Admit) 60 bpm    Heart Rate (Exercise) 90 bpm    Heart Rate (Exit) 70 bpm    Rating of Perceived Exertion (Exercise) 13    Duration Continue with 30 min of aerobic exercise without signs/symptoms of physical distress.    Intensity THRR unchanged      Progression   Progression Continue to progress workloads to maintain intensity without signs/symptoms of physical distress.      Resistance Training   Training Prescription Yes    Weight 3    Reps 10-15      NuStep   Level 4    SPM 79    Minutes 30    METs 2      Oxygen   Maintain Oxygen Saturation 88% or higher             Functional Capacity:  6 Minute Walk     Row Name 05/03/23 1420         6 Minute Walk   Phase Initial     Distance 794 feet     Walk Time 6 minutes     #  of Rest Breaks 0     MPH 1.5     METS 1.24     RPE 13     Perceived Dyspnea  2     VO2 Peak 4.37     Symptoms Yes (comment)     Comments chest tightness (3/10), harder to breathe/SOB     Resting HR 56 bpm     Resting BP 132/74     Resting Oxygen Saturation  97 %     Exercise Oxygen Saturation  during 6 min walk 96 %     Max Ex. HR 99 bpm     Max Ex. BP 136/64     2 Minute Post BP 106/62              Psychological, QOL, Others - Outcomes: PHQ 2/9:    06/14/2023    3:33 PM 05/03/2023    1:09 PM 03/23/2023    2:47 PM 01/05/2023   10:20 AM 12/08/2022   10:27 AM  Depression screen PHQ 2/9  Decreased Interest 0 0 1 1 2   Down, Depressed, Hopeless 0 2 1 1 1   PHQ - 2 Score 0 2 2 2 3   Altered sleeping  3 2  0  Tired, decreased energy  3 0  1  Change in appetite  0 0  0  Feeling bad or failure about yourself   0 0  0  Trouble concentrating  1 0  0  Moving slowly or fidgety/restless  1 0  0  Suicidal thoughts  0 0  0  PHQ-9 Score  10 4  4   Difficult doing work/chores  Very difficult   Somewhat difficult      Nutrition  & Weight - Outcomes:  Pre Biometrics - 05/03/23 1425       Pre Biometrics   Height 5' 0.25" (1.53 m)    Weight 72.9 kg    Waist Circumference 36.5 inches    Hip Circumference 39.5 inches    Waist to Hip Ratio 0.92 %    BMI (Calculated) 31.16    Triceps Skinfold 20 mm    % Body Fat 41.1 %    Grip Strength 19.6 kg    Single Leg Stand 2 seconds

## 2023-07-28 NOTE — Progress Notes (Signed)
Cardiac Individual Treatment Plan  Patient Details  Name: Angela Rose MRN: [id number] Date of Birth: [birth year] Referring Provider:   Flowsheet Row CARDIAC REHAB PHASE II ORIENTATION from 05/03/2023 in [facility] CARDIAC REHABILITATION  Referring Provider [name] MD  [Attending Cardiologist: Dr. Corrie Dandy Branch]       Initial Encounter Date:  Flowsheet Row CARDIAC REHAB PHASE II ORIENTATION from 05/03/2023 in [place] CARDIAC REHABILITATION  Date 05/03/23       Visit Diagnosis: ST elevation myocardial infarction (STEMI) involving other coronary artery (HCC)  Patient's Home Medications on Admission:  Current Outpatient Medications:    aspirin 81 MG tablet, Take by mouth every evening. , Disp: , Rfl:    azelastine (ASTELIN) 0.1 % nasal spray, Place 1 spray into both nostrils 2 (two) times daily. Use in each nostril as directed (Patient not taking: Reported on 07/28/2023), Disp: 30 mL, Rfl: 1   b complex vitamins capsule, Take 1 capsule by mouth daily. (Patient not taking: Reported on 07/28/2023), Disp: , Rfl:    cetirizine (ZYRTEC) 5 MG tablet, Take 1 tablet (5 mg total) by mouth daily. (Patient not taking: Reported on 04/19/2023), Disp: 30 tablet, Rfl: 0   clopidogrel (PLAVIX) 75 MG tablet, Take 1 tablet (75 mg total) by mouth daily., Disp: 90 tablet, Rfl: 3   dapagliflozin propanediol (FARXIGA) 10 MG TABS tablet, Take 1 tablet (10 mg total) by mouth daily., Disp: 30 tablet, Rfl: 11   Evolocumab (REPATHA) 140 MG/ML SOSY, Inject 140 mg into the skin every 14 (fourteen) days., Disp: 2.1 mL, Rfl: 3   ezetimibe (ZETIA) 10 MG tablet, Take 1 tablet (10 mg total) by mouth daily., Disp: 90 tablet, Rfl: 3   FLUoxetine (PROZAC) 40 MG capsule, Take 1 capsule (40 mg total) by mouth daily., Disp: 90 capsule, Rfl: 3   furosemide (LASIX) 40 MG tablet, Take 1 tablet (40 mg) for 3 lb weight gain overnight., Disp: 30 tablet, Rfl: 11   Glucose Blood (BLOOD GLUCOSE TEST STRIPS) STRP, Use to  check glucose twice daily, Disp: 100 strip, Rfl: 11   glucose blood test strip, Frequency:PHARMDIR   Dosage:0.0     Instructions:  Note:testing 3-4xqd, DX Code 250.00 Dose: 1, Disp: , Rfl:    insulin degludec (TRESIBA FLEXTOUCH) 100 UNIT/ML FlexTouch Pen, Inject 16 Units into the skin at bedtime., Disp: 4.5 mL, Rfl: 2   Insulin Pen Needle (BD PEN NEEDLE NANO U/F) 32G X 4 MM MISC, Use to check glucose once daily, Disp: 100 each, Rfl: 6   levothyroxine (SYNTHROID) 50 MCG tablet, Take 1 tablet (50 mcg total) by mouth daily before breakfast. (Patient taking differently: Take 50 mcg by mouth at bedtime.), Disp: 90 tablet, Rfl: 3   metFORMIN (GLUCOPHAGE-XR) 500 MG 24 hr tablet, Take 1 tablet (500 mg total) by mouth daily with breakfast. Resume on 01/18/23, Disp: 90 tablet, Rfl: 3   metoprolol succinate (TOPROL-XL) 25 MG 24 hr tablet, Take 1 tablet (25 mg total) by mouth daily., Disp: 90 tablet, Rfl: 3   nitroGLYCERIN (NITROSTAT) 0.4 MG SL tablet, Place 1 tablet (0.4 mg total) under the tongue every 5 (five) minutes as needed for chest pain., Disp: 25 tablet, Rfl: 3   sacubitril-valsartan (ENTRESTO) 24-26 MG, Take 1 tablet by mouth 2 (two) times daily., Disp: 180 tablet, Rfl: 3   sodium chloride (OCEAN) 0.65 % SOLN nasal spray, Place 1 spray into both nostrils as needed for congestion. (Patient not taking: Reported on 07/28/2023), Disp: 30 mL, Rfl: 0  spironolactone (ALDACTONE) 25 MG tablet, Take 0.5 tablets (12.5 mg total) by mouth daily., Disp: 45 tablet, Rfl: 3   Study - LIBREXIA-ACS - milvexian 25 mg or placebo tablet (PI-Stuckey), Take 1 tablet by mouth 2 (two) times daily. Take 1 tablet by mouth twice a day with or without food. Bring bottle back to every visit. Please contact [facility] for any questions or concerns regarding the medication, Disp: 280 tablet, Rfl: 0   Vitamin D, Ergocalciferol, (DRISDOL) 1.25 MG (50000 UNIT) CAPS capsule, Take 1 capsule (50,000 Units total) by mouth every 7  (seven) days. (Patient not taking: Reported on 07/28/2023), Disp: 10 capsule, Rfl: 1  Past Medical History: Past Medical History:  Diagnosis Date   Anxiety 07/10/2014   Cancer (HCC) 12/13/2011   melanoma face   CHF (congestive heart failure), NYHA class II, acute on chronic, combined (HCC) 01/14/2023   Chronic kidney disease (CKD), stage III (moderate) (HCC) 02/17/2022   Chronic sinusitis 07/02/2022   Depression 07/10/2014   Diabetes mellitus without complication (HCC) 08/21/2014   Fracture of tibial plateau 12/31/2015   Hyperlipidemia 08/14/2015   Hypertension 09/03/2014   Hypothyroidism, postablative 02/08/2014   Myocardial infarction (HCC) 1993   Tick bites 10/29/2014   Vitamin D deficiency 12/08/2022    Tobacco Use: Social History   Tobacco Use  Smoking Status Former   Current packs/day: 0.00   Types: Cigarettes   Quit date: 06/11/1982   Years since quitting: 41.1  Smokeless Tobacco Never  Tobacco Comments   smoking cessation materials not required    Labs: Review Flowsheet  More data exists      Latest Ref Rng & Units 02/10/2022 05/10/2022 12/06/2022 01/12/2023 05/02/2023  Labs for ITP Cardiac and Pulmonary Rehab  Cholestrol 0 - 200 mg/dL 427  062  376  283  -  LDL (calc) 0 - 99 mg/dL 84  151  761  607  -  HDL-C >40 mg/dL 47  51  51  57  -  Trlycerides <150 mg/dL 371  062  694  854  -  Hemoglobin A1c 4.0 - 5.6 % 7.9  - 9.3  9.4  7.8     Details            Capillary Blood Glucose: Lab Results  Component Value Date   GLUCAP 215 (H) 05/30/2023   GLUCAP 141 (H) 05/13/2023   GLUCAP 144 (H) 05/11/2023   GLUCAP 257 (H) 05/11/2023   GLUCAP 160 (H) 05/06/2023     Exercise Target Goals: Exercise Program Goal: Individual exercise prescription set using results from initial 6 min walk test and THRR while considering  patient's activity barriers and safety.   Exercise Prescription Goal: Starting with aerobic activity 30 plus minutes a day, 3 days per week for  initial exercise prescription. Provide home exercise prescription and guidelines that participant acknowledges understanding prior to discharge.  Activity Barriers & Risk Stratification:  Activity Barriers & Cardiac Risk Stratification - 05/03/23 1247       Activity Barriers & Cardiac Risk Stratification   Activity Barriers Joint Problems;Other (comment);Deconditioning;Muscular Weakness;Balance Concerns;Back Problems;Decreased Ventricular Function    Comments previous L knee injury, buldging disc occassional pain, can't floor currently    Cardiac Risk Stratification High             6 Minute Walk:  6 Minute Walk     Row Name 05/03/23 1420         6 Minute Walk   Phase Initial  Distance 794 feet     Walk Time 6 minutes     # of Rest Breaks 0     MPH 1.5     METS 1.24     RPE 13     Perceived Dyspnea  2     VO2 Peak 4.37     Symptoms Yes (comment)     Comments chest tightness (3/10), harder to breathe/SOB     Resting HR 56 bpm     Resting BP 132/74     Resting Oxygen Saturation  97 %     Exercise Oxygen Saturation  during 6 min walk 96 %     Max Ex. HR 99 bpm     Max Ex. BP 136/64     2 Minute Post BP 106/62              Oxygen Initial Assessment:   Oxygen Re-Evaluation:   Oxygen Discharge (Final Oxygen Re-Evaluation):   Initial Exercise Prescription:  Initial Exercise Prescription - 05/03/23 1400       Date of Initial Exercise RX and Referring Provider   Date 05/03/23    Referring Provider [name] MD   Attending Cardiologist: Dr. Carolan Clines     Oxygen   Maintain Oxygen Saturation 88% or higher      Treadmill   MPH 1.2    Grade 0    Minutes 15    METs 1.92      NuStep   Level 1    SPM 80    Minutes 15    METs 1.5      Prescription Details   Frequency (times per week) 3    Duration Progress to 30 minutes of continuous aerobic without signs/symptoms of physical distress      Intensity   THRR 40-80% of Max Heartrate 90-124     Ratings of Perceived Exertion 11-13    Perceived Dyspnea 0-4      Progression   Progression Continue to progress workloads to maintain intensity without signs/symptoms of physical distress.      Resistance Training   Training Prescription Yes    Weight 3 lb    Reps 10-15             Perform Capillary Blood Glucose checks as needed.  Exercise Prescription Changes:   Exercise Prescription Changes     Row Name 05/03/23 1400 05/09/23 1300 05/23/23 1300 06/20/23 1300       Response to Exercise   Blood Pressure (Admit) 132/74 120/60 106/64 94/50    Blood Pressure (Exercise) 136/64 120/60 -- --    Blood Pressure (Exit) 106/62 98/58 114/62 90/48    Heart Rate (Admit) 56 bpm 71 bpm 99 bpm 60 bpm    Heart Rate (Exercise) 99 bpm 82 bpm 100 bpm 90 bpm    Heart Rate (Exit) 62 bpm 69 bpm 70 bpm 70 bpm    Oxygen Saturation (Admit) 97 % -- -- --    Oxygen Saturation (Exercise) 96 % -- -- --    Rating of Perceived Exertion (Exercise) 13 12 13 13     Perceived Dyspnea (Exercise) 2 -- -- --    Symptoms chest tightness 3/10, harder to breathe/SOB -- -- --    Comments walk test results -- -- --    Duration -- Continue with 30 min of aerobic exercise without signs/symptoms of physical distress. Continue with 30 min of aerobic exercise without signs/symptoms of physical distress. Continue with 30 min of aerobic exercise without  signs/symptoms of physical distress.    Intensity -- THRR unchanged THRR unchanged THRR unchanged      Progression   Progression -- Continue to progress workloads to maintain intensity without signs/symptoms of physical distress. Continue to progress workloads to maintain intensity without signs/symptoms of physical distress. Continue to progress workloads to maintain intensity without signs/symptoms of physical distress.      Resistance Training   Training Prescription -- Yes Yes Yes    Weight -- 2 1 3     Reps -- 10-15 10-15 10-15      Treadmill   MPH -- 1.2 2  --    Grade -- 0 0 --    Minutes -- 15 15 --    METs -- 1.92 2.53 --      NuStep   Level -- 2 1 4     SPM -- 69 75 79    Minutes -- 15 15 30     METs -- 1.8 1.8 2      Oxygen   Maintain Oxygen Saturation -- 88% or higher 88% or higher 88% or higher             Exercise Comments:   Exercise Goals and Review:   Exercise Goals     Row Name 05/03/23 1424             Exercise Goals   Increase Physical Activity Yes       Intervention Provide advice, education, support and counseling about physical activity/exercise needs.;Develop an individualized exercise prescription for aerobic and resistive training based on initial evaluation findings, risk stratification, comorbidities and participant's personal goals.       Expected Outcomes Short Term: Attend rehab on a regular basis to increase amount of physical activity.;Long Term: Add in home exercise to make exercise part of routine and to increase amount of physical activity.;Long Term: Exercising regularly at least 3-5 days a week.       Increase Strength and Stamina Yes       Intervention Provide advice, education, support and counseling about physical activity/exercise needs.;Develop an individualized exercise prescription for aerobic and resistive training based on initial evaluation findings, risk stratification, comorbidities and participant's personal goals.       Expected Outcomes Short Term: Increase workloads from initial exercise prescription for resistance, speed, and METs.;Short Term: Perform resistance training exercises routinely during rehab and add in resistance training at home;Long Term: Improve cardiorespiratory fitness, muscular endurance and strength as measured by increased METs and functional capacity ( )       Able to understand and use rate of perceived exertion (RPE) scale Yes       Intervention Provide education and explanation on how to use RPE scale       Expected Outcomes Short Term: Able to use RPE  daily in rehab to express subjective intensity level;Long Term:  Able to use RPE to guide intensity level when exercising independently       Able to understand and use Dyspnea scale Yes       Intervention Provide education and explanation on how to use Dyspnea scale       Expected Outcomes Short Term: Able to use Dyspnea scale daily in rehab to express subjective sense of shortness of breath during exertion;Long Term: Able to use Dyspnea scale to guide intensity level when exercising independently       Knowledge and understanding of Target Heart Rate Range (THRR) Yes       Intervention Provide education and explanation of  THRR including how the numbers were predicted and where they are located for reference       Expected Outcomes Short Term: Able to state/look up THRR;Long Term: Able to use THRR to govern intensity when exercising independently;Short Term: Able to use daily as guideline for intensity in rehab       Able to check pulse independently Yes       Intervention Provide education and demonstration on how to check pulse in carotid and radial arteries.;Review the importance of being able to check your own pulse for safety during independent exercise       Expected Outcomes Short Term: Able to explain why pulse checking is important during independent exercise;Long Term: Able to check pulse independently and accurately       Understanding of Exercise Prescription Yes       Intervention Provide education, explanation, and written materials on patient's individual exercise prescription       Expected Outcomes Short Term: Able to explain program exercise prescription;Long Term: Able to explain home exercise prescription to exercise independently                Exercise Goals Re-Evaluation :  Exercise Goals Re-Evaluation     Row Name 05/03/23 1425 05/09/23 1348 05/11/23 1112 05/27/23 1347 06/03/23 1102     Exercise Goal Re-Evaluation   Exercise Goals Review Knowledge and understanding  of Target Heart Rate Range (THRR);Able to understand and use Dyspnea scale;Understanding of Exercise Prescription -- Increase Physical Activity;Increase Strength and Stamina;Understanding of Exercise Prescription Increase Physical Activity;Understanding of Exercise Prescription;Increase Strength and Stamina Increase Strength and Stamina;Increase Physical Activity;Understanding of Exercise Prescription   Comments Reviewed RPE  and dyspnea scale and program prescription with pt today.  Pt voiced understanding and was given a copy of goals to take home. She is tolerating exercise well but has not increased on her workloads yet. She grips the treadmill when walking she states that it bothers her shoulders and arms Pt states that saw a difference in her strength after her first time in rehab.  She states that feels stronger and has been able to do more around the house such as cooking and cleaning.  Pt stated that "this is the best I have felt in a long time."  Pt is not exercising at home right now.  She states that the heat prevents her from walking outside, but she is interested in going to the [facility] to exercise. Brittnea has increased her speed on the treadmill to 2.0. Will continue to monitor and progress as able. Renn has been complaning of lower back pain due to a disc that she has. She is not using the treadmill to see if it's causing her to have the back pain after walking prolonged time on it. She said that her legs have gotten stronger. She is able to use her steps at home without needing to hold on. Flonnie Hailstone is going to start walking with her partner at home. Went over exercising at home and sent home exercise packet.   Expected Outcomes Short: Use RPE daily to regulate intensity.  Long: Follow program prescription -- Short term:  Go over home exercise plan  Long term:  Pt will start exercising at [facility] or home Short term: increase level on the NuStep in the next week   long term: continue to attend cardiac rehab  Short term: increase level on NuStep to help build endrance   long term: continue to attend cardiac rehab and exercise at home  Discharge Exercise Prescription (Final Exercise Prescription Changes):  Exercise Prescription Changes - 06/20/23 1300       Response to Exercise   Blood Pressure (Admit) 94/50    Blood Pressure (Exit) 90/48    Heart Rate (Admit) 60 bpm    Heart Rate (Exercise) 90 bpm    Heart Rate (Exit) 70 bpm    Rating of Perceived Exertion (Exercise) 13    Duration Continue with 30 min of aerobic exercise without signs/symptoms of physical distress.    Intensity THRR unchanged      Progression   Progression Continue to progress workloads to maintain intensity without signs/symptoms of physical distress.      Resistance Training   Training Prescription Yes    Weight 3    Reps 10-15      NuStep   Level 4    SPM 79    Minutes 30    METs 2      Oxygen   Maintain Oxygen Saturation 88% or higher             Nutrition:  Target Goals: Understanding of nutrition guidelines, daily intake of sodium 1500mg , cholesterol 200mg , calories 30% from fat and 7% or less from saturated fats, daily to have 5 or more servings of fruits and vegetables.  Biometrics:  Pre Biometrics - 05/03/23 1425       Pre Biometrics   Height 5' 0.25" (1.53 m)    Weight 72.9 kg    Waist Circumference 36.5 inches    Hip Circumference 39.5 inches    Waist to Hip Ratio 0.92 %    BMI (Calculated) 31.16    Triceps Skinfold 20 mm    % Body Fat 41.1 %    Grip Strength 19.6 kg    Single Leg Stand 2 seconds              Nutrition Therapy Plan and Nutrition Goals:  Nutrition Therapy & Goals - 05/03/23 1307       Intervention Plan   Intervention Prescribe, educate and counsel regarding individualized specific dietary modifications aiming towards targeted core components such as weight, hypertension, lipid management, diabetes, heart failure and other  comorbidities.;Nutrition handout(s) given to patient.    Expected Outcomes Short Term Goal: Understand basic principles of dietary content, such as calories, fat, sodium, cholesterol and nutrients.             Nutrition Assessments:  MEDIFICTS Score Key: >=70 Need to make dietary changes  40-70 Heart Healthy Diet <= 40 Therapeutic Level Cholesterol Diet  Flowsheet Row CARDIAC REHAB PHASE II ORIENTATION from 05/03/2023 in [facility] CARDIAC RE[facility]ABILITATION  Picture Your Plate Total Score on Admission 71      Picture Your Plate Scores: <69 Unhealthy dietary pattern with much room for improvement. 41-50 Dietary pattern unlikely to meet recommendations for good health and room for improvement. 51-60 More healthful dietary pattern, with some room for improvement.  >60 Healthy dietary pattern, although there may be some specific behaviors that could be improved.    Nutrition Goals Re-Evaluation:  Nutrition Goals Re-Evaluation     Row Name 05/11/23 1143 06/03/23 1115           Goals   Nutrition Goal Heart healthy diet Heart healthy diet      Comment Pt states that her diet has improved and she has lost quite a bit of weight.  Pt does not eat much red meat because she prefers to eat chicken.  She enjoys eating a  small bowl of icecream a few times a week; otherwise, she tries to stay away from sweets except for fresh fruit. Pt diet continues to improve. She states that her partner found out he has fatty liver so they have started to eat veggies at dinner and cutting out red meats. She said she has lost a pound with this but has noticed her sugar is low in the morning.      Expected Outcome Short term:  Pt is happy with her eating habits-maintain focus on healthy eating               Long term:  Pt will follow a heart healthy diet. Short term: pt going to eat more at night to watch sugar   long term: continue to follow healthy diet               Nutrition Goals Discharge (Final  Nutrition Goals Re-Evaluation):  Nutrition Goals Re-Evaluation - 06/03/23 1115       Goals   Nutrition Goal Heart healthy diet    Comment Pt diet continues to improve. She states that her partner found out he has fatty liver so they have started to eat veggies at dinner and cutting out red meats. She said she has lost a pound with this but has noticed her sugar is low in the morning.    Expected Outcome Short term: pt going to eat more at night to watch sugar   long term: continue to follow healthy diet             Psychosocial: Target Goals: Acknowledge presence or absence of significant depression and/or stress, maximize coping skills, provide positive support system. Participant is able to verbalize types and ability to use techniques and skills needed for reducing stress and depression.  Initial Review & Psychosocial Screening:  Initial Psych Review & Screening - 05/03/23 1259       Initial Review   Current issues with Current Stress Concerns;Current Depression;Current Anxiety/Panic;Current Sleep Concerns    Source of Stress Concerns Family;Chronic Illness;Unable to perform yard/household activities;Unable to participate in former interests or hobbies    Comments ex-husband can be stressful sometimes, heart health was unexpected and trying to recover and not be able to go and do, situational depression/anxiety, partner is a [employer] loss adjuster, got bed bugs last week from something from [employer] and now it keeps her up at night looking out for things, PHQ is a 10, participating in heart study      Family Dynamics   Good Support System? Yes   significant other of 21 yrs, 3 grown kids     Barriers   Psychosocial barriers to participate in program Psychosocial barriers identified (see note);The patient should benefit from training in stress management and relaxation.      Screening Interventions   Interventions Encouraged to exercise;Provide feedback about the scores to participant;To provide  support and resources with identified psychosocial needs    Expected Outcomes Short Term goal: Utilizing psychosocial counselor, staff and physician to assist with identification of specific Stressors or current issues interfering with healing process. Setting desired goal for each stressor or current issue identified.;Long Term Goal: Stressors or current issues are controlled or eliminated.;Short Term goal: Identification and review with participant of any Quality of Life or Depression concerns found by scoring the questionnaire.;Long Term goal: The participant improves quality of Life and PHQ9 Scores as seen by post scores and/or verbalization of changes  Quality of Life Scores:  Quality of Life - 05/03/23 1426       Quality of Life   Select Quality of Life      Quality of Life Scores   Health/Function Pre 13.25 %    Socioeconomic Pre 11.83 %    Psych/Spiritual Pre 13.17 %    Family Pre 18.2 %    GLOBAL Pre 13.76 %            Scores of 19 and below usually indicate a poorer quality of life in these areas.  A difference of  2-3 points is a clinically meaningful difference.  A difference of 2-3 points in the total score of the Quality of Life Index has been associated with significant improvement in overall quality of life, self-image, physical symptoms, and general health in studies assessing change in quality of life.  PHQ-9: Review Flowsheet  More data exists      06/14/2023 05/03/2023 03/23/2023 01/05/2023 12/08/2022  Depression screen PHQ 2/9  Decreased Interest 0 0 1 1 2   Down, Depressed, Hopeless 0 2 1 1 1   PHQ - 2 Score 0 2 2 2 3   Altered sleeping - 3 2 - 0  Tired, decreased energy - 3 0 - 1  Change in appetite - 0 0 - 0  Feeling bad or failure about yourself  - 0 0 - 0  Trouble concentrating - 1 0 - 0  Moving slowly or fidgety/restless - 1 0 - 0  Suicidal thoughts - 0 0 - 0  PHQ-9 Score - 10 4 - 4  Difficult doing work/chores - Very difficult - - Somewhat  difficult    Details           Interpretation of Total Score  Total Score Depression Severity:  1-4 = Minimal depression, 5-9 = Mild depression, 10-14 = Moderate depression, 15-19 = Moderately severe depression, 20-27 = Severe depression   Psychosocial Evaluation and Intervention:  Psychosocial Evaluation - 05/03/23 1429       Psychosocial Evaluation & Interventions   Interventions Stress management education;Relaxation education;Encouraged to exercise with the program and follow exercise prescription    Comments Maze is coming into cardiac rehab after a STEMI in April.  She has a previous heart history including MI and CABG.  She was unsure why she suddenly had a heart attack and went into heart failure. She was excited to be able to come off of her LifeVest last week after her TEE showed improvement!!  She has a great support system in her partner of 21 years.  They look out for each other but he is a bad influence on her eating, but she has recently lost almost 50 lb since taking over her diet herself!!  She wants to get moving again and to feel better again.  She wants to be able to walk without having to stop and rest for her breath and chest pain.  She normally is a good sleeper, but recently they got a bout of bed bugs and she wakes in the night hunting for them.  She is hoping to be rid of them soon!  She did not have any traces of bed bugs on her today and was encouraged to continue to launder her bedding to clear them up.  She is retired and on a limited monthly income, but makes the best of it.  One of her stressors is her exhusband when she has to deal with him from time to time.  She  does have some occassional depression symptoms especially since she isn't able to go and do like she would like to currently.  Her PHQ is currently a 10 and we will reassess in a month.  Currently attributted to not sleeping well and not haveing the energy that she would like to have.  She also wants to  build up her strength and stamina overall.  She has no barriers to attending rehab other than her fatigue levels.  She is eage to get started to get to feeling better.    Expected Outcomes Short: Attend rehab to build up strength and stamina Long: Continue to work on improving sleep overall.    Continue Psychosocial Services  Follow up required by staff             Psychosocial Re-Evaluation:  Psychosocial Re-Evaluation     Row Name 05/06/23 1109 05/11/23 1121 06/03/23 1111         Psychosocial Re-Evaluation   Current issues with Current Stress Concerns Current Stress Concerns;Current Depression Current Stress Concerns;Current Depression     Comments Spoke to pt about bedbugs again.  She says they have not seen any over the last two nights.  Asked her to keep [place] updated. Pt is sleeping better since she and her partner have not seen any bedbugs for a few weeks.  She does still have some depression related to her diagnosis of heart failure, but she states that the depression has improved since she has more energy to do activities around the house.  She does worry since her father died of heart disease at an early age.  She still takes Prozac for depression, but she states that she would like to either get off it or try another anti depressant.  We spoke about talking to her primary doctor or his nurse about the Prozac. Pt has been staying up late watching TV so her sleep has been off recently. She continues to not see bedbugs. She likes to get up before her partner in the morning to have her coffee and quiet time to herself. She continues to have depression related to her diagosis.     Expected Outcomes Short: Keep watch on bedbugs Long: Completely gone Short term:  Continue to have a positive attitude and do more activities around the house           Long term:  Once she has established rapport with her new primary care MD  talk to him about Prozac Short term:  Continue to have a positive attitude  and do more activities around the house           Long term:  continue to identify concenrs and have positive attitude     Interventions Stress management education;Encouraged to attend Cardiac Rehabilitation for the exercise Stress management education;Relaxation education;Encouraged to attend Cardiac Rehabilitation for the exercise Stress management education;Relaxation education;Encouraged to attend Cardiac Rehabilitation for the exercise     Continue Psychosocial Services  Follow up required by staff Follow up required by staff Follow up required by staff       Initial Review   Source of Stress Concerns -- Chronic Illness;Family --              Psychosocial Discharge (Final Psychosocial Re-Evaluation):  Psychosocial Re-Evaluation - 06/03/23 1111       Psychosocial Re-Evaluation   Current issues with Current Stress Concerns;Current Depression    Comments Pt has been staying up late watching TV so her sleep has been  off recently. She continues to not see bedbugs. She likes to get up before her partner in the morning to have her coffee and quiet time to herself. She continues to have depression related to her diagosis.    Expected Outcomes Short term:  Continue to have a positive attitude and do more activities around the house           Long term:  continue to identify concenrs and have positive attitude    Interventions Stress management education;Relaxation education;Encouraged to attend Cardiac Rehabilitation for the exercise    Continue Psychosocial Services  Follow up required by staff             Vocational Rehabilitation: Provide vocational rehab assistance to qualifying candidates.   Vocational Rehab Evaluation & Intervention:  Vocational Rehab - 05/03/23 1258       Initial Vocational Rehab Evaluation & Intervention   Assessment shows need for Vocational Rehabilitation No   retired            Education: Education Goals: Education classes will be provided on a  weekly basis, covering required topics. Participant will state understanding/return demonstration of topics presented.  Learning Barriers/Preferences:  Learning Barriers/Preferences - 05/03/23 1247       Learning Barriers/Preferences   Learning Barriers Hearing   R hearing aids, L deaf   Learning Preferences None             Education Topics: Hypertension, Hypertension Reduction -Define heart disease and high blood pressure. Discus how high blood pressure affects the body and ways to reduce high blood pressure.   Exercise and Your Heart -Discuss why it is important to exercise, the FITT principles of exercise, normal and abnormal responses to exercise, and how to exercise safely.   Angina -Discuss definition of angina, causes of angina, treatment of angina, and how to decrease risk of having angina.   Cardiac Medications -Review what the following cardiac medications are used for, how they affect the body, and side effects that may occur when taking the medications.  Medications include Aspirin, Beta blockers, calcium channel blockers, ACE Inhibitors, angiotensin receptor blockers, diuretics, digoxin, and antihyperlipidemics. Flowsheet Row CARDIAC REHAB PHASE II EXERCISE from 07/22/2023 in [place] CARDIAC REHABILITATION  Date 05/04/23  Educator HB  Instruction Review Code 1- Verbalizes Understanding       Congestive Heart Failure -Discuss the definition of CHF, how to live with CHF, the signs and symptoms of CHF, and how keep track of weight and sodium intake. Flowsheet Row CARDIAC REHAB PHASE II EXERCISE from 07/22/2023 in [place] CARDIAC REHABILITATION  Date 06/22/23  Educator [facility]  Instruction Review Code 2- Demonstrated Understanding       Heart Disease and Intimacy -Discus the effect sexual activity has on the heart, how changes occur during intimacy as we age, and safety during sexual activity. Flowsheet Row CARDIAC REHAB PHASE II EXERCISE from 07/22/2023  in [place] CARDIAC REHABILITATION  Date 05/18/23  Educator [facility]  Instruction Review Code 1- Verbalizes Understanding       Smoking Cessation / COPD -Discuss different methods to quit smoking, the health benefits of quitting smoking, and the definition of COPD. Flowsheet Row CARDIAC REHAB PHASE II EXERCISE from 07/22/2023 in [place] CARDIAC REHABILITATION  Date 06/15/23  Educator jh  Instruction Review Code 1- Verbalizes Understanding       Nutrition I: Fats -Discuss the types of cholesterol, what cholesterol does to the heart, and how cholesterol levels can be controlled. Flowsheet Row CARDIAC  REHAB PHASE II EXERCISE from 07/22/2023 in [place] [facility]IAC REHABILIT[facility]  Date 05/25/23  Educator [facility]  Instruction Review Code 1- Verbalizes Understanding       Nutrition II: Labels -Discuss the different components of food labels and how to read food label Flowsheet Row CARDIAC REHAB PHASE II EXERCISE from 07/22/2023 in [place] CARDIAC REHABILITATION  Date 05/25/23  Educator [facility]  Instruction Review Code 1- Verbalizes Understanding       Heart Parts/Heart Disease and PAD -Discuss the anatomy of the heart, the pathway of blood circulation through the heart, and these are affected by heart disease.   Stress I: Signs and Symptoms -Discuss the causes of stress, how stress may lead to anxiety and depression, and ways to limit stress.   Stress II: Relaxation -Discuss different types of relaxation techniques to limit stress.   Warning Signs of Stroke / TIA -Discuss definition of a stroke, what the signs and symptoms are of a stroke, and how to identify when someone is having stroke.   Knowledge Questionnaire Score:  Knowledge Questionnaire Score - 05/03/23 1309       Knowledge Questionnaire Score   Pre Score 22/24             Core Components/Risk Factors/Patient Goals at Admission:  Personal Goals and Risk Factors at Admission - 05/03/23 1258       Core  Components/Risk Factors/Patient Goals on Admission    Weight Management Yes;Obesity;Weight Maintenance;Weight Loss    Intervention Weight Management: Develop a combined nutrition and exercise program designed to reach desired caloric intake, while maintaining appropriate intake of nutrient and fiber, sodium and fats, and appropriate energy expenditure required for the weight goal.;Weight Management: Provide education and appropriate resources to help participant work on and attain dietary goals.;Weight Management/Obesity: Establish reasonable short term and long term weight goals.;Obesity: Provide education and appropriate resources to help participant work on and attain dietary goals.    Admit Weight 160 lb 12.8 oz (72.9 kg)    Goal Weight: Short Term 155 lb (70.3 kg)    Goal Weight: Long Term 155 lb (70.3 kg)    Expected Outcomes Short Term: Continue to assess and modify interventions until short term weight is achieved;Long Term: Adherence to nutrition and physical activity/exercise program aimed toward attainment of established weight goal;Weight Loss: Understanding of general recommendations for a balanced deficit meal plan, which promotes 1-2 lb weight loss per week and includes a negative energy balance of [phone] kcal/d;Understanding recommendations for meals to include 15-35% energy as protein, 25-35% energy from fat, 35-60% energy from carbohydrates, less than 200mg  of dietary cholesterol, 20-35 gm of total fiber daily;Understanding of distribution of calorie intake throughout the day with the consumption of 4-5 meals/snacks;Weight Maintenance: Understanding of the daily nutrition guidelines, which includes 25-35% calories from fat, 7% or less cal from saturated fats, less than 200mg  cholesterol, less than 1.5gm of sodium, & 5 or more servings of fruits and vegetables daily    Diabetes Yes    Intervention Provide education about signs/symptoms and action to take for hypo/hyperglycemia.;Provide  education about proper nutrition, including hydration, and aerobic/resistive exercise prescription along with prescribed medications to achieve blood glucose in normal ranges: Fasting glucose 65-99 mg/dL    Expected Outcomes Short Term: Participant verbalizes understanding of the signs/symptoms and immediate care of hyper/hypoglycemia, proper foot care and importance of medication, aerobic/resistive exercise and nutrition plan for blood glucose control.;Long Term: Attainment of HbA1C < 7%.    Heart Failure Yes  Intervention Provide a combined exercise and nutrition program that is supplemented with education, support and counseling about heart failure. Directed toward relieving symptoms such as shortness of breath, decreased exercise tolerance, and extremity edema.    Expected Outcomes Improve functional capacity of life;Short term: Attendance in program 2-3 days a week with increased exercise capacity. Reported lower sodium intake. Reported increased fruit and vegetable intake. Reports medication compliance.;Short term: Daily weights obtained and reported for increase. Utilizing diuretic protocols set by physician.;Long term: Adoption of self-care skills and reduction of barriers for early signs and symptoms recognition and intervention leading to self-care maintenance.    Hypertension Yes    Intervention Provide education on lifestyle modifcations including regular physical activity/exercise, weight management, moderate sodium restriction and increased consumption of fresh fruit, vegetables, and low fat dairy, alcohol moderation, and smoking cessation.;Monitor prescription use compliance.    Expected Outcomes Short Term: Continued assessment and intervention until BP is < 140/61mm HG in hypertensive participants. < 130/3mm HG in hypertensive participants with diabetes, heart failure or chronic kidney disease.;Long Term: Maintenance of blood pressure at goal levels.    Lipids Yes    Intervention Provide  education and support for participant on nutrition & aerobic/resistive exercise along with prescribed medications to achieve LDL 70mg , HDL >40mg .    Expected Outcomes Short Term: Participant states understanding of desired cholesterol values and is compliant with medications prescribed. Participant is following exercise prescription and nutrition guidelines.;Long Term: Cholesterol controlled with medications as prescribed, with individualized exercise RX and with personalized nutrition plan. Value goals: LDL < 70mg , HDL > 40 mg.             Core Components/Risk Factors/Patient Goals Review:   Goals and Risk Factor Review     Row Name 05/11/23 1152 06/03/23 1120           Core Components/Risk Factors/Patient Goals Review   Personal Goals Review Weight Management/Obesity;Heart Failure;Hypertension;Lipids;Diabetes Weight Management/Obesity;Diabetes;Hypertension      Review Pt checks her blood sugar 1-2 times a day.  She states that she forgets a lot a night but she usually remembers to check it in the morning.  She does not check her BP or her weight at home, but she does check for swelling in her feet/legs daily.  We talked about the importance of daily weights, as well as checking her BP at home since she has a machine.  Since her partner also needs to check his BP at home we talked about them trying to check their BP together. Pt said that due to starting to eat veggie for dinner she has noticed her CBS in the mornings have been 66. She is going to eat a little more with dinner to make sure her CBG does not drop in the moring. She is going to get a new blood pressure cuff to start taking it at home. She and her partner will take it together to keep accountable.      Expected Outcomes Short term:  Pt will start checking her weight daily and also checking her BP at home.            Long term:  Better understanding of heart failure Short term: check BP daily   long term: continue to watch sugar and  montior BP               Core Components/Risk Factors/Patient Goals at Discharge (Final Review):   Goals and Risk Factor Review - 06/03/23 1120  Core Components/Risk Factors/Patient Goals Review   Personal Goals Review Weight Management/Obesity;Diabetes;Hypertension    Review Pt said that due to starting to eat veggie for dinner she has noticed her CBS in the mornings have been 66. She is going to eat a little more with dinner to make sure her CBG does not drop in the moring. She is going to get a new blood pressure cuff to start taking it at home. She and her partner will take it together to keep accountable.    Expected Outcomes Short term: check BP daily   long term: continue to watch sugar and montior BP             ITP Comments:  ITP Comments     Row Name 05/03/23 1418 05/04/23 1125 05/25/23 0828 06/22/23 0821 07/15/23 1516   ITP Comments Patient attend orientation today.  Patient is attendingCardiac Rehabilitation Program.  Documentation for diagnosis can be found in [facility] encounter [facility]/22/23.  Reviewed medical chart, RPE/RPD, gym safety, and program guidelines.  Patient was fitted to equipment they will be using during rehab.  Patient is scheduled to start exercise on 05/04/23.   Initial ITP created and sent for review and signature by Dr. Dina Rich, Medical Director for Cardiac Rehabilitation Program. First full day of exercise!  Patient was oriented to gym and equipment including functions, settings, policies, and procedures.  Patient's individual exercise prescription and treatment plan were reviewed.  All starting workloads were established based on the results of the 6 minute walk test done at initial orientation visit.  The plan for exercise progression was also introduced and progression will be customized based on patient's performance and goals 30 day review completed. ITP sent to Dr. Dina Rich, Medical Director of Cardiac Rehab. Continue with ITP unless changes  are made by physician. 30 day review completed. ITP sent to Dr. Dina Rich, Medical Director of Cardiac Rehab. Continue with ITP unless changes are made by physician. Called patient regarding attendance. She says she has been out due to her spouse falling and she was not able to leave him. She says they both have had a URI. She has tested negative for COVID but has not seen a doctor. She was informed her last day would be Friday. She verbalized understanding and said she hopes to feel like coming next week. She has been running a low-grade fever and was told she needed to be without a fever for 24 hous before returning. She verbalized understanding.    Row Name 07/20/23 0843 07/22/23 1120         ITP Comments 30 day review completed. ITP sent to Dr. Dina Rich, Medical Director of Cardiac Rehab. Continue with ITP unless changes are made by physician.  Pt has not attended since 06/29/23. Unable to assess for goals this round. Yazlin graduated today from  rehab with 29 sessions completed.  Details of the patient's exercise prescription and what She needs to do in order to continue the prescription and progress were discussed with patient.  Patient was given a copy of prescription and goals.  Patient verbalized understanding. Demika plans to continue to exercise by by walking/ exercise at homw.               Comments: Discharge ITP

## 2023-07-29 ENCOUNTER — Encounter (HOSPITAL_COMMUNITY): Payer: Medicare HMO

## 2023-07-29 NOTE — Telephone Encounter (Signed)
[facility] calling says they have not received the paperwork back from this pt. Fax # [phone] call back to refax # [phone] Thanks

## 2023-07-30 LAB — HEPATIC FUNCTION PANEL
ALT: 14 [IU]/L (ref 0–32)
AST: 18 [IU]/L (ref 0–40)
Albumin: 3.8 g/dL (ref 3.8–4.8)
Alkaline Phosphatase: 128 [IU]/L — ABNORMAL HIGH (ref 44–121)
Bilirubin Total: <0.2 mg/dL (ref 0.0–1.2)
Bilirubin, Direct: <0.1 mg/dL (ref 0.00–0.40)
Total Protein: 6.7 g/dL (ref 6.0–8.5)

## 2023-08-04 ENCOUNTER — Telehealth: Payer: Self-pay | Admitting: Internal Medicine

## 2023-08-04 NOTE — Telephone Encounter (Signed)
Patient called needs new script for incontinence supplies-pullups sent to [facility] [phone] or [phone].

## 2023-08-04 NOTE — Telephone Encounter (Signed)
error 

## 2023-08-04 NOTE — Telephone Encounter (Signed)
[facility] and requested new order be faxed, confirmed our fax number.

## 2023-08-09 ENCOUNTER — Ambulatory Visit: Payer: Medicare HMO | Attending: Internal Medicine | Admitting: Internal Medicine

## 2023-08-09 VITALS — BP 122/70 | HR 57 | Ht 65.0 in | Wt 163.4 lb

## 2023-08-09 DIAGNOSIS — I10 Essential (primary) hypertension: Secondary | ICD-10-CM

## 2023-08-09 MED ORDER — FUROSEMIDE 40 MG PO TABS: 40.0000 mg | ORAL_TABLET | ORAL | 5 refills | Status: DC | PRN

## 2023-08-09 NOTE — Progress Notes (Signed)
Cardiology Office Note:    Date:  08/09/2023   ID:  Angela Rose, DOB Dec 30, 1945, MRN [id number]  PCP:  Billie Lade, MD   [facility] Providers Cardiologist:  Maisie Fus, MD     Referring MD: Billie Lade, MD   No chief complaint on file.  Establish Care  History of Present Illness:    Angela Rose is a 77 y.o. female with a hx of MI in 1993 CABG 1993 3v 1996, HTN, HLD, PAD, former smoker, carotid atherosclerosis, no hx of stroke, previously followed at [facility] w/ Dr. Gwen Pounds when she lived in [place] she has since moved to [place]  referral to establish care here  In March 12/25/2018 she had EKG per report during an annual visit and it showed ?atrial fibrillation with a HR of 74. She has CHADS2VASC of 5. She was reluctant to start anticoagulation. She was started on ASA. She saw cardiology at [facility] 01/16/2019 who did an event monitor and it showed PACs, PVCs, asymptomatic bradycardia, sinus tachycardia but no atrial fibrillation. No captured data c/w atrial fibrillation in the cardiology notes.   She was noted to have hx of 3v CABG in 1996, she notes mild MI 1993 with stents.. She has hx of PVD and carotid atherosclerosis that is stable. She was started on ASA 81 mg daily and statin.She had a stress test that was normal 01/10/2019. She denies palpitations. No chest pain. No pnd , orthopnea, or LE edema. She notes she is fatigued. She's been under a lot of stress..   For her hypertension she is managed on norvasc 2.5 mg daily, HCTz 25 mg daily and enalapril 5 mg BID. Her blood pressure is well controlled per report, it's high today. Its typically 115-125 mg.   For HLD she's on crestor 5 mg daily he has T2DM  on metformin 500 mg BID Shes on jardiance.   She's here with husband from [place], now they are in [place].   Labs: 04/27/2021 LDL 108 TC 192 TSH 6.14  A1c 8.9%    Interim Hx 12/28/2021: [name] presents for follow up. She notes some  allergy symptoms. No syncope. SOB, or chest pressure. She feels overall fatigued and worried it may be due to thyroid v aging  Interim Hx 07/06/2022 Lipids were not at goal. Her crestor increased to 40 and she started zetia with her PCP. She stopped crestor inadvertently when she started zetia.  No CP/SOB. She denies orthopnea/PND or LE edema.  Interim Hx 01/25/2023 She presented to the hospital with an inferior STEMI. After her last visit, she started crestor and zetia. Her LDL remained high. Started repatha. She then came in with acute CP, radiating to both arms. EKG showed inferior STE. She has significant native disease. Prox graft SVG-OM2-dca (culprit) was unable to be ballooned. She given a a life vest  Interim hx 08/09/2023 She comes in today for a follow-up appointment.  Her blood pressure is well-controlled and heart rate is as well. She denies CHF symptoms. Notes increased urination.   Past Medical History:  Diagnosis Date   Anxiety 07/10/2014   Cancer (HCC) 12/13/2011   melanoma face   CHF (congestive heart failure), NYHA class II, acute on chronic, combined (HCC) 01/14/2023   Chronic kidney disease (CKD), stage III (moderate) (HCC) 02/17/2022   Chronic sinusitis 07/02/2022   Depression 07/10/2014   Diabetes mellitus without complication (HCC) 08/21/2014   Fracture of tibial plateau 12/31/2015   Hyperlipidemia 08/14/2015   Hypertension  09/03/2014   Hypothyroidism, postablative 02/08/2014   Myocardial infarction [facility]) 1993   Tick bites 10/29/2014   Vitamin D deficiency 12/08/2022    Past Surgical History:  Procedure Laterality Date   ABDOMINAL HYSTERECTOMY     APPENDECTOMY     BASAL CELL CARCINOMA EXCISION     BREAST BIOPSY Right    needle bx-neg   CATARACT EXTRACTION, BILATERAL Bilateral 11/23/2018   CORONARY ARTERY BYPASS GRAFT  1996   x 3   CORONARY/GRAFT ACUTE MI REVASCULARIZATION N/A 01/13/2023   Procedure: Coronary/Graft Acute MI Revascularization;  Surgeon:  Marykay Lex, MD;  Location: [facility] INVASIVE CV LAB;  Service: Cardiovascular;  Laterality: N/A;   LEFT HEART CATH AND CORONARY ANGIOGRAPHY N/A 01/13/2023   Procedure: LEFT HEART CATH AND CORONARY ANGIOGRAPHY;  Surgeon: Marykay Lex, MD;  Location: [facility] INVASIVE CV LAB;  Service: Cardiovascular;  Laterality: N/A;   MELANOMA EXCISION     PARTIAL HYSTERECTOMY  1976   ovaries remain    Current Medications: No outpatient medications have been marked as taking for the 08/09/23 encounter (Office Visit) with Maisie Fus, MD.     Allergies:   Propoxyphene, Lidocaine-epinephrine (pf), Other, Tape, Xylocaine  [lidocaine hcl], Statins, and Jardiance [empagliflozin]   Social History   Socioeconomic History   Marital status: Significant Other    Spouse name: Not on file   Number of children: 3   Years of education: Not on file   Highest education level: Some college, no degree  Occupational History   Occupation: retired  Tobacco Use   Smoking status: Former    Current packs/day: 0.00    Types: Cigarettes    Quit date: 06/11/1982    Years since quitting: 41.1   Smokeless tobacco: Never   Tobacco comments:    smoking cessation materials not required  Vaping Use   Vaping status: Never Used  Substance and Sexual Activity   Alcohol use: Yes    Alcohol/week: 0.0 standard drinks of alcohol    Comment: rare 1 wine glass of wine   Drug use: No   Sexual activity: Not on file  Other Topics Concern   Not on file  Social History Narrative   Not on file   Social Determinants of Health   Financial Resource Strain: Low Risk  (01/14/2023)   Overall Financial Resource Strain (CARDIA)    Difficulty of Paying Living Expenses: Not very hard  Food Insecurity: No Food Insecurity (01/13/2023)   Hunger Vital Sign    Worried About Running Out of Food in the Last Year: Never true    Ran Out of Food in the Last Year: Never true  Transportation Needs: No Transportation Needs (01/13/2023)   PRAPARE -  Administrator, Civil Service (Medical): No    Lack of Transportation (Non-Medical): No  Physical Activity: Inactive (12/14/2021)   Exercise Vital Sign    Days of Exercise per Week: 0 days    Minutes of Exercise per Session: 0 min  Stress: Stress Concern Present (12/14/2021)   [employer] of Occupational Health - Occupational Stress Questionnaire    Feeling of Stress : To some extent  Social Connections: Moderately Isolated (12/14/2021)   Social Connection and Isolation Panel [NHANES]    Frequency of Communication with Friends and Family: More than three times a week    Frequency of Social Gatherings with Friends and Family: Once a week    Attends Religious Services: Never    Database administrator or Organizations:  No    Attends Banker Meetings: Never    Marital Status: Living with partner     Family History: The patient's family history includes Basal cell carcinoma in her mother; CAD in her father; Heart disease in her father; Skin cancer in her mother; Squamous cell carcinoma in her mother. There is no history of Breast cancer.  ROS:   Please see the history of present illness.     All other systems reviewed and are negative.  EKGs/Labs/Other Studies Reviewed:    The following studies were reviewed today:   EKG:  EKG is  ordered today.  The ekg ordered today demonstrates   Sinus with PACs,1st degree AV block, PVC  08/09/2023-Sinus rhythm w/ PACs    Cardiology Studies  TTE 07/23/2020 at [facility]  NORMAL LEFT VENTRICULAR SYSTOLIC FUNCTION WITH MILD LVH NORMAL RIGHT VENTRICULAR SYSTOLIC FUNCTION MILD VALVULAR REGURGITATION (See above) NO VALVULAR STENOSIS MILD MR, TR, PR EF 50%  LHC 01/13/2023 Dist LM to Ost LAD lesion is 90% stenosed.  Prox LAD to Mid LAD lesion is 100% stenosed (after SP1).   LIMA-(D2)-LAD graft was visualized by angiography and is moderate in size.  The graft exhibits no disease.   Mid Cx lesion is 100% stenosed.   Prox  RCA to Dist RCA lesion is 100% stenosed.   Seq SVG- Sequential SVG-OM2-distal\ RCA graft was visualized by angiography and is large.   Mid Graft lesion before 2nd Mrg  is 99% stenosed. -> Balloon angioplasty was performed using a BALLN SAPPHIRE 1.5X20. & Balloon angioplasty was performed using a BALLN SAPPHIRE 1.0X10. = Neither balloon was able to cross the lesion.  Post intervention, there is a 95% residual stenosis.   Dist Graft lesion before 2nd Mrg  is 80% stenosed.  Remainder of the proximal limb is free of disease until the first anastomosis   First anastomosis is to the 2nd Mrg lesion-> retrograde flow reveals 65% stenosed proximal to SVG insertion site.   Prox Graft lesion between 2nd Mrg and Dist RCA  is 90% stenosed.  Prox Graft to Insertion lesion between 2nd Mrg and Dist RCA is 80% stenosed-diffuse thrombus beyond the 90% stenosis.   There is moderate left ventricular systolic dysfunction.   LV end diastolic pressure is normal.   The left ventricular ejection fraction is 35-45% by visual estimate.   TTE 01/13/2023- EF 30-35%, RV is normal,  trivial MR,  Recent Labs: 12/27/2022: TSH 2.160 01/15/2023: Hemoglobin 10.8; Magnesium 2.2; Platelets 242 03/23/2023: BUN 31; Creatinine, Ser 1.44; Potassium 5.1; Sodium 139 07/28/2023: ALT 14   Recent Lipid Panel    Component Value Date/Time   CHOL 242 (H) 01/12/2023 2339   CHOL 325 (H) 12/06/2022 1116   TRIG 261 (H) 01/12/2023 2339   HDL 57 01/12/2023 2339   HDL 51 12/06/2022 1116   CHOLHDL 4.2 01/12/2023 2339   VLDL 52 (H) 01/12/2023 2339   LDLCALC 133 (H) 01/12/2023 2339   LDLCALC 247 (H) 12/06/2022 1116     Risk Assessment/Calculations:         Physical Exam:    VS:   Vitals:   08/09/23 1327  BP: 122/70  Pulse: (!) 57  SpO2: 93%     Wt Readings from Last 3 Encounters:  08/09/23 163 lb 6.4 oz (74.1 kg)  06/14/23 162 lb 3.2 oz (73.6 kg)  05/03/23 160 lb 12.8 oz (72.9 kg)     GEN:  Well nourished, well developed in no  acute distress HEENT: Normal NECK:  No JVD;  LYMPHATICS: No lymphadenopathy CARDIAC: RRR, II/IV systolic ejection murmur RESPIRATORY:  Clear to auscultation without rales, wheezing or rhonchi  ABDOMEN: Soft, non-tender, non-distended MUSCULOSKELETAL:  No edema; No deformity  SKIN: Warm and dry NEUROLOGIC:  Alert and oriented x 3 PSYCHIATRIC:  Normal affect   ASSESSMENT:    #Ischemic Systolic HF NYHA Class II:  EF 30-35% Hx of CABG per above. Now s/p recent inferior STEMI unsuccessful ballooning of SVG-OM-dRCA culprit severe lesion. She has significant native disease. Had Wenckebach on her zio, so BB was held in the past.  Tolerated in the hospital.  Her EF recovered to 40 to 45% in July. - no CHF symptoms, euvolemic. At her dry weight around 160 - continue asa/plavix  - continue metop succinate XL 25 mg daily - changed losartan to entresto 24-26 mg BID - continue spironolactone 12.5 mg daily - will plan for lasix 40 mg daily PRN - continue Repatha , continue zetia - potentially on study drug Milvexian    #DM2: A1c 9.4%, A1c goal <7. Had yeast infections with Jardiance. On insulin/metformin. Sees endocrinology  #HTN: well controlled   PLAN:    In order of problems listed above:   Discussed Lasix to be taken as needed Follow up in 6 months      Medication Adjustments/Labs and Tests Ordered: Current medicines are reviewed at length with the patient today.  Concerns regarding medicines are outlined above.   Signed, Maisie Fus, MD  08/09/2023 1:34 PM     [facility]

## 2023-08-09 NOTE — Patient Instructions (Signed)
Medication Instructions:  Lasix as needed *If you need a refill on your cardiac medications before your next appointment, please call your pharmacy*   Lab Work: none   Testing/Procedures: none   Follow-Up: At [facility], you and your health needs are our priority.  As part of our continuing mission to provide you with exceptional heart care, we have created designated Provider Care Teams.  These Care Teams include your primary Cardiologist (physician) and Advanced Practice Providers (APPs -  Physician Assistants and Nurse Practitioners) who all work together to provide you with the care you need, when you need it.   Your next appointment:   6 month(s)  Provider:   Maisie Fus, MD

## 2023-08-15 DIAGNOSIS — R3981 Functional urinary incontinence: Secondary | ICD-10-CM | POA: Diagnosis not present

## 2023-08-15 DIAGNOSIS — E119 Type 2 diabetes mellitus without complications: Secondary | ICD-10-CM | POA: Diagnosis not present

## 2023-08-31 ENCOUNTER — Telehealth: Payer: Self-pay

## 2023-08-31 NOTE — Telephone Encounter (Signed)
Copied from CRM [id number]. Topic: Clinical - Medication Refill >> Aug 31, 2023 12:34 PM Mosetta Putt H wrote: Recurring uti doctor has prescribed antibiotic in the past that has helped it was for 10 days but because patient has to wear dependents all the time she is prone to utis and when she pee it hurts, she's wondering if doctor can call in another prescription

## 2023-09-01 ENCOUNTER — Ambulatory Visit: Payer: Medicare HMO | Admitting: Dermatology

## 2023-09-01 ENCOUNTER — Encounter: Payer: Self-pay | Admitting: Dermatology

## 2023-09-01 DIAGNOSIS — W908XXA Exposure to other nonionizing radiation, initial encounter: Secondary | ICD-10-CM

## 2023-09-01 DIAGNOSIS — L821 Other seborrheic keratosis: Secondary | ICD-10-CM | POA: Diagnosis not present

## 2023-09-01 DIAGNOSIS — Z1283 Encounter for screening for malignant neoplasm of skin: Secondary | ICD-10-CM

## 2023-09-01 DIAGNOSIS — L578 Other skin changes due to chronic exposure to nonionizing radiation: Secondary | ICD-10-CM

## 2023-09-01 DIAGNOSIS — D1801 Hemangioma of skin and subcutaneous tissue: Secondary | ICD-10-CM | POA: Diagnosis not present

## 2023-09-01 DIAGNOSIS — D0462 Carcinoma in situ of skin of left upper limb, including shoulder: Secondary | ICD-10-CM | POA: Diagnosis not present

## 2023-09-01 DIAGNOSIS — L814 Other melanin hyperpigmentation: Secondary | ICD-10-CM

## 2023-09-01 DIAGNOSIS — L570 Actinic keratosis: Secondary | ICD-10-CM

## 2023-09-01 DIAGNOSIS — Z5111 Encounter for antineoplastic chemotherapy: Secondary | ICD-10-CM

## 2023-09-01 DIAGNOSIS — Z85828 Personal history of other malignant neoplasm of skin: Secondary | ICD-10-CM

## 2023-09-01 DIAGNOSIS — D492 Neoplasm of unspecified behavior of bone, soft tissue, and skin: Secondary | ICD-10-CM

## 2023-09-01 DIAGNOSIS — D485 Neoplasm of uncertain behavior of skin: Secondary | ICD-10-CM

## 2023-09-01 DIAGNOSIS — D229 Melanocytic nevi, unspecified: Secondary | ICD-10-CM

## 2023-09-01 DIAGNOSIS — Z85820 Personal history of malignant melanoma of skin: Secondary | ICD-10-CM

## 2023-09-01 MED ORDER — FLUOROURACIL 5 % EX CREA
TOPICAL_CREAM | Freq: Two times a day (BID) | CUTANEOUS | 0 refills | Status: AC
Start: 2023-09-01 — End: 2023-09-15

## 2023-09-01 NOTE — Progress Notes (Signed)
   New Patient Visit   Subjective  Angela Rose is a 77 y.o. female who presents for the following: Skin Cancer Screening and Full Body Skin Exam. Pt hx of MM on face.  Mother hx of NMSC.  The patient presents for Total-Body Skin Exam (TBSE) for skin cancer screening and mole check. The patient has spots, moles and lesions to be evaluated, some may be new or changing.   The following portions of the chart were reviewed this encounter and updated as appropriate: medications, allergies, medical history  Review of Systems:  No other skin or systemic complaints except as noted in HPI or Assessment and Plan.  Objective  Well appearing patient in no apparent distress; mood and affect are within normal limits.  A full examination was performed including scalp, head, eyes, ears, nose, lips, neck, chest, axillae, abdomen, back, buttocks, bilateral upper extremities, bilateral lower extremities, hands, feet, fingers, toes, fingernails, and toenails. All findings within normal limits unless otherwise noted below.   Relevant physical exam findings are noted in the Assessment and Plan. No cervical, axillary or inguinal lymphadenopathy.  Left Dorsal Hand Hyperkeratotic papilloma 1.0 x .07     Left Malar Cheek Scar on left cheek   Assessment & Plan   SKIN CANCER SCREENING PERFORMED TODAY.  ACTINIC DAMAGE - Chronic condition, secondary to cumulative UV/sun exposure - diffuse scaly erythematous macules with underlying dyspigmentation - Recommend daily broad spectrum sunscreen SPF 30+ to sun-exposed areas, reapply every 2 hours as needed.  - Staying in the shade or wearing long sleeves, sun glasses (UVA+UVB protection) and wide brim hats (4-inch brim around the entire circumference of the hat) are also recommended for sun protection.  - Call for new or changing lesions.  LENTIGINES, SEBORRHEIC KERATOSES, HEMANGIOMAS - Benign normal skin lesions - Benign-appearing - Call for any  changes  MELANOCYTIC NEVI - Tan-brown and/or pink-flesh-colored symmetric macules and papules - Benign appearing on exam today - Observation - Call clinic for new or changing moles - Recommend daily use of broad spectrum spf 30+ sunscreen to sun-exposed areas.   AK (actinic keratosis) Mid Forehead  fluorouracil (EFUDEX) 5 % cream - Mid Forehead Apply topically 2 (two) times daily for 14 days. To the face, nose and forehead up to the hairline.  Neoplasm of uncertain behavior of skin Left Dorsal Hand  Skin / nail biopsy Type of biopsy: tangential   Informed consent: discussed and consent obtained   Timeout: patient name, date of birth, surgical site, and procedure verified   Procedure prep:  Patient was prepped and draped in usual sterile fashion Prep type:  Isopropyl alcohol Anesthesia: the lesion was anesthetized in a standard fashion   Anesthetic:  1% lidocaine w/ epinephrine 1-100,000 buffered w/ 8.4% NaHCO3 Instrument used: DermaBlade   Hemostasis achieved with: aluminum chloride   Outcome: patient tolerated procedure well   Post-procedure details: sterile dressing applied and wound care instructions given   Dressing type: bandage and petrolatum    History of melanoma Left Malar Cheek  History of basal cell carcinoma (BCC) Right Forearm - Posterior   Return in about 2 months (around 11/01/2023) for AK f/u.  I, Tillie Fantasia, CMA, am acting as scribe for Gwenith Daily, MD.   Documentation: I have reviewed the above documentation for accuracy and completeness, and I agree with the above.  Gwenith Daily, MD

## 2023-09-01 NOTE — Patient Instructions (Addendum)
Efudex Instructions 1. Apply a thin layer of Efudex cream to the specified sun  damaged area twice a day. The medication is to be applied to the entire  area, not just the keratoses (rough spots). Wash hands thoroughly after  application! 2. Use Efudex twice daily for 2 weeks to 4 weeks (depending on the  physician directions) or once a day for 4 weeks (depending on the  physician directions).  3. The skin will become red and scaly. This is the appropriate response.  4. Once redness starts, you may begin using Hydrocortisone 1% Cream (or  other topical steroid provided by the physician) twice a day. This helps  relieve pain and will speed the healing process. It may be applied as soon  as 30 minutes after Efudex application. 5. For excessive reactions or if suspicious for an infection or allergic  reaction, please call to make an appointment at my office so that I may  evaluate your response to treatment. 6. The area may be washed as you normally would with soap and water. Do  not wash the face for at least three hours after Efudex  application. 7. Ladies: Chrystie Nose is permitted throughout the entire treatment period. Wait  30 minutes after Efudex use before applying makeup directly on top of the  medication. 8. Please read the instructional pamphlet provided by the [employer] so that you may familiarize yourself with the kinds of reactions  you may experience. 9. If you have had a biopsy on any area that you will use the medication on,  DO NOT start the Efudex until your results are given to you and  the medical assistant lets you know it is okay to start the treatment  Important Information  Due to recent changes in healthcare laws, you may see results of your pathology and/or laboratory studies on MyChart before the doctors have had a chance to review them. We understand that in some cases there may be results that are confusing or concerning to you. Please understand that not  all results are received at the same time and often the doctors may need to interpret multiple results in order to provide you with the best plan of care or course of treatment. Therefore, we ask that you please give [place] 2 business days to thoroughly review all your results before contacting the office for clarification. Should we see a critical lab result, you will be contacted sooner.   If You Need Anything After Your Visit  If you have any questions or concerns for your doctor, please call our main line at [phone] If no one answers, please leave a voicemail as directed and we will return your call as soon as possible. Messages left after 4 pm will be answered the following business day.   You may also send [place] a message via MyChart. We typically respond to MyChart messages within 1-2 business days.  For prescription refills, please ask your pharmacy to contact our office. Our fax number is [phone].  If you have an urgent issue when the clinic is closed that cannot wait until the next business day, you can page your doctor at the number below.    Please note that while we do our best to be available for urgent issues outside of office hours, we are not available 24/7.   If you have an urgent issue and are unable to reach [place], you may choose to seek medical care at your doctor's office, retail clinic, urgent  care center, or emergency room.  If you have a medical emergency, please immediately call 911 or go to the emergency department. In the event of inclement weather, please call our main line at [phone] for an update on the status of any delays or closures.  Dermatology Medication Tips: Please keep the boxes that topical medications come in in order to help keep track of the instructions about where and how to use these. Pharmacies typically print the medication instructions only on the boxes and not directly on the medication tubes.   If your medication is too expensive, please  contact our office at [phone] or send [place] a message through MyChart.   We are unable to tell what your co-pay for medications will be in advance as this is different depending on your insurance coverage. However, we may be able to find a substitute medication at lower cost or fill out paperwork to get insurance to cover a needed medication.   If a prior authorization is required to get your medication covered by your insurance company, please allow [place] 1-2 business days to complete this process.  Drug prices often vary depending on where the prescription is filled and some pharmacies may offer cheaper prices.  The website www.goodrx.com contains coupons for medications through different pharmacies. The prices here do not account for what the cost may be with help from insurance (it may be cheaper with your insurance), but the website can give you the price if you did not use any insurance.  - You can print the associated coupon and take it with your prescription to the pharmacy.  - You may also stop by our office during regular business hours and pick up a GoodRx coupon card.  - If you need your prescription sent electronically to a different pharmacy, notify our office through [facility] or by phone at [phone]    Skin Education :   I counseled the patient regarding the following: Sun screen (SPF 30 or greater) should be applied during peak UV exposure (between 10am and 2pm) and reapplied after exercise or swimming.  The ABCDEs of melanoma were reviewed with the patient, and the importance of monthly self-examination of moles was emphasized. Should any moles change in shape or color, or itch, bleed or burn, pt will contact our office for evaluation sooner then their interval appointment.  Plan: Sunscreen Recommendations I recommended a broad spectrum sunscreen with a SPF of 30 or higher. I explained that SPF 30 sunscreens block approximately 97 percent of the sun's harmful  rays. Sunscreens should be applied at least 15 minutes prior to expected sun exposure and then every 2 hours after that as long as sun exposure continues. If swimming or exercising sunscreen should be reapplied every 45 minutes to an hour after getting wet or sweating. One ounce, or the equivalent of a shot glass full of sunscreen, is adequate to protect the skin not covered by a bathing suit. I also recommended a lip balm with a sunscreen as well. Sun protective clothing can be used in lieu of sunscreen but must be worn the entire time you are exposed to the sun's rays.  Patient Handout: Wound Care for Skin Biopsy Site  Taking Care of Your Skin Biopsy Site  Proper care of the biopsy site is essential for promoting healing and minimizing scarring. This handout provides instructions on how to care for your biopsy site to ensure optimal recovery.  1. Cleaning the Wound:  Clean the biopsy site daily  with gentle soap and water. Gently pat the area dry with a clean, soft towel. Avoid harsh scrubbing or rubbing the area, as this can irritate the skin and delay healing.  2. Applying Aquaphor and Bandage:  After cleaning the wound, apply a thin layer of Aquaphor ointment to the biopsy site. Cover the area with a sterile bandage to protect it from dirt, bacteria, and friction. Change the bandage daily or as needed if it becomes soiled or wet.  3. Continued Care for One Week:  Repeat the cleaning, Aquaphor application, and bandaging process daily for one week following the biopsy procedure. Keeping the wound clean and moist during this initial healing period will help prevent infection and promote optimal healing.  4. Massaging Aquaphor into the Area:  ---After one week, discontinue the use of bandages but continue to apply Aquaphor to the biopsy site. ----Gently massage the Aquaphor into the area using circular motions. ---Massaging the skin helps to promote circulation and prevent the formation  of scar tissue.   Additional Tips:  Avoid exposing the biopsy site to direct sunlight during the healing process, as this can cause hyperpigmentation or worsen scarring. If you experience any signs of infection, such as increased redness, swelling, warmth, or drainage from the wound, contact your healthcare provider immediately. Follow any additional instructions provided by your healthcare provider for caring for the biopsy site and managing any discomfort. Conclusion:  Taking proper care of your skin biopsy site is crucial for ensuring optimal healing and minimizing scarring. By following these instructions for cleaning, applying Aquaphor, and massaging the area, you can promote a smooth and successful recovery. If you have any questions or concerns about caring for your biopsy site, don't hesitate to contact your healthcare provider for guidance.

## 2023-09-02 DIAGNOSIS — E890 Postprocedural hypothyroidism: Secondary | ICD-10-CM | POA: Diagnosis not present

## 2023-09-02 DIAGNOSIS — N1832 Chronic kidney disease, stage 3b: Secondary | ICD-10-CM | POA: Diagnosis not present

## 2023-09-02 DIAGNOSIS — E1122 Type 2 diabetes mellitus with diabetic chronic kidney disease: Secondary | ICD-10-CM | POA: Diagnosis not present

## 2023-09-02 DIAGNOSIS — Z794 Long term (current) use of insulin: Secondary | ICD-10-CM | POA: Diagnosis not present

## 2023-09-03 LAB — COMPREHENSIVE METABOLIC PANEL
ALT: 19 [IU]/L (ref 0–32)
AST: 29 [IU]/L (ref 0–40)
Albumin: 4.2 g/dL (ref 3.8–4.8)
Alkaline Phosphatase: 133 [IU]/L — ABNORMAL HIGH (ref 44–121)
BUN/Creatinine Ratio: 19 (ref 12–28)
BUN: 30 mg/dL — ABNORMAL HIGH (ref 8–27)
Bilirubin Total: 0.4 mg/dL (ref 0.0–1.2)
CO2: 22 mmol/L (ref 20–29)
Calcium: 9 mg/dL (ref 8.7–10.3)
Chloride: 103 mmol/L (ref 96–106)
Creatinine, Ser: 1.54 mg/dL — ABNORMAL HIGH (ref 0.57–1.00)
Globulin, Total: 3.1 g/dL (ref 1.5–4.5)
Glucose: 141 mg/dL — ABNORMAL HIGH (ref 70–99)
Potassium: 5.7 mmol/L — ABNORMAL HIGH (ref 3.5–5.2)
Sodium: 139 mmol/L (ref 134–144)
Total Protein: 7.3 g/dL (ref 6.0–8.5)
eGFR: 34 mL/min/{1.73_m2} — ABNORMAL LOW (ref >59)

## 2023-09-03 LAB — T4, FREE: Free T4: 1.28 ng/dL (ref 0.82–1.77)

## 2023-09-03 LAB — TSH: TSH: 2.66 u[IU]/mL (ref 0.450–4.500)

## 2023-09-05 ENCOUNTER — Encounter: Payer: Self-pay | Admitting: Nurse Practitioner

## 2023-09-05 ENCOUNTER — Ambulatory Visit (INDEPENDENT_AMBULATORY_CARE_PROVIDER_SITE_OTHER): Payer: Medicare HMO | Admitting: Nurse Practitioner

## 2023-09-05 ENCOUNTER — Ambulatory Visit (INDEPENDENT_AMBULATORY_CARE_PROVIDER_SITE_OTHER): Payer: Medicare HMO | Admitting: Family Medicine

## 2023-09-05 ENCOUNTER — Encounter: Payer: Self-pay | Admitting: Family Medicine

## 2023-09-05 VITALS — BP 109/62 | HR 67 | Ht 66.0 in | Wt 164.8 lb

## 2023-09-05 VITALS — BP 110/69 | HR 63 | Ht 66.0 in | Wt 164.1 lb

## 2023-09-05 DIAGNOSIS — E1122 Type 2 diabetes mellitus with diabetic chronic kidney disease: Secondary | ICD-10-CM

## 2023-09-05 DIAGNOSIS — I10 Essential (primary) hypertension: Secondary | ICD-10-CM | POA: Diagnosis not present

## 2023-09-05 DIAGNOSIS — N3090 Cystitis, unspecified without hematuria: Secondary | ICD-10-CM

## 2023-09-05 DIAGNOSIS — Z7984 Long term (current) use of oral hypoglycemic drugs: Secondary | ICD-10-CM

## 2023-09-05 DIAGNOSIS — E890 Postprocedural hypothyroidism: Secondary | ICD-10-CM

## 2023-09-05 DIAGNOSIS — Z794 Long term (current) use of insulin: Secondary | ICD-10-CM

## 2023-09-05 DIAGNOSIS — N1832 Chronic kidney disease, stage 3b: Secondary | ICD-10-CM

## 2023-09-05 DIAGNOSIS — N390 Urinary tract infection, site not specified: Secondary | ICD-10-CM | POA: Diagnosis not present

## 2023-09-05 LAB — POCT GLYCOSYLATED HEMOGLOBIN (HGB A1C): Hemoglobin A1C: 8.4 % — AB (ref 4.0–5.6)

## 2023-09-05 MED ORDER — NITROFURANTOIN MONOHYD MACRO 100 MG PO CAPS: 100.0000 mg | ORAL_CAPSULE | Freq: Two times a day (BID) | ORAL | 0 refills | Status: AC

## 2023-09-05 NOTE — Progress Notes (Signed)
Acute Office Visit  Subjective:    Patient ID: Angela Rose, female    DOB: [birth year], 77 y.o.   MRN: [id number]  Chief Complaint  Patient presents with   Urinary Tract Infection    Thinks its from all the medication she takes. Needs an antibiotic    HPI The patient presents today with complaints of urinary frequency, urgency, and pain with urination. She reports that her symptoms began several weeks ago and denies experiencing fever or chills. She has also noticed that her urine appears cloudy in color. The patient has not tried any over-the-counter treatments.   Past Medical History:  Diagnosis Date   Anxiety 07/10/2014   Basal cell carcinoma    Cancer (HCC) 12/13/2011   melanoma face   CHF (congestive heart failure), NYHA class II, acute on chronic, combined (HCC) 01/14/2023   Chronic kidney disease (CKD), stage III (moderate) (HCC) 02/17/2022   Chronic sinusitis 07/02/2022   Depression 07/10/2014   Diabetes mellitus without complication (HCC) 08/21/2014   Fracture of tibial plateau 12/31/2015   Hyperlipidemia 08/14/2015   Hypertension 09/03/2014   Hypothyroidism, postablative 02/08/2014   Melanoma (HCC)    Myocardial infarction (HCC) 1993   Tick bites 10/29/2014   Vitamin D deficiency 12/08/2022    Past Surgical History:  Procedure Laterality Date   ABDOMINAL HYSTERECTOMY     APPENDECTOMY     BASAL CELL CARCINOMA EXCISION     BREAST BIOPSY Right    needle bx-neg   CATARACT EXTRACTION, BILATERAL Bilateral 11/23/2018   CORONARY ARTERY BYPASS GRAFT  1996   x 3   CORONARY/GRAFT ACUTE MI REVASCULARIZATION N/A 01/13/2023   Procedure: Coronary/Graft Acute MI Revascularization;  Surgeon: Marykay Lex, MD;  Location: [facility] INVASIVE CV LAB;  Service: Cardiovascular;  Laterality: N/A;   LEFT HEART CATH AND CORONARY ANGIOGRAPHY N/A 01/13/2023   Procedure: LEFT HEART CATH AND CORONARY ANGIOGRAPHY;  Surgeon: Marykay Lex, MD;  Location: [facility] INVASIVE CV LAB;   Service: Cardiovascular;  Laterality: N/A;   MELANOMA EXCISION     PARTIAL HYSTERECTOMY  1976   ovaries remain    Family History  Problem Relation Age of Onset   Skin cancer Mother    Squamous cell carcinoma Mother    Basal cell carcinoma Mother    Heart disease Father    CAD Father    Breast cancer Neg Hx     Social History   Socioeconomic History   Marital status: Significant Other    Spouse name: Not on file   Number of children: 3   Years of education: Not on file   Highest education level: Some college, no degree  Occupational History   Occupation: retired  Tobacco Use   Smoking status: Former    Current packs/day: 0.00    Types: Cigarettes    Quit date: 06/11/1982    Years since quitting: 41.2   Smokeless tobacco: Never   Tobacco comments:    smoking cessation materials not required  Vaping Use   Vaping status: Never Used  Substance and Sexual Activity   Alcohol use: Yes    Alcohol/week: 0.0 standard drinks of alcohol    Comment: rare 1 wine glass of wine   Drug use: No   Sexual activity: Not on file  Other Topics Concern   Not on file  Social History Narrative   Not on file   Social Determinants of Health   Financial Resource Strain: Low Risk  (01/14/2023)   Overall Financial Resource  Strain (CARDIA)    Difficulty of Paying Living Expenses: Not very hard  Food Insecurity: No Food Insecurity (01/13/2023)   Hunger Vital Sign    Worried About Running Out of Food in the Last Year: Never true    Ran Out of Food in the Last Year: Never true  Transportation Needs: No Transportation Needs (01/13/2023)   PRAPARE - Administrator, Civil Service (Medical): No    Lack of Transportation (Non-Medical): No  Physical Activity: Inactive (12/14/2021)   Exercise Vital Sign    Days of Exercise per Week: 0 days    Minutes of Exercise per Session: 0 min  Stress: Stress Concern Present (12/14/2021)   [employer] of Occupational Health - Occupational Stress  Questionnaire    Feeling of Stress : To some extent  Social Connections: Moderately Isolated (12/14/2021)   Social Connection and Isolation Panel [NHANES]    Frequency of Communication with Friends and Family: More than three times a week    Frequency of Social Gatherings with Friends and Family: Once a week    Attends Religious Services: Never    Database administrator or Organizations: No    Attends Banker Meetings: Never    Marital Status: Living with partner  Intimate Partner Violence: Not At Risk (01/13/2023)   Humiliation, Afraid, Rape, and Kick questionnaire    Fear of Current or Ex-Partner: No    Emotionally Abused: No    Physically Abused: No    Sexually Abused: No    Outpatient Medications Prior to Visit  Medication Sig Dispense Refill   aspirin 81 MG tablet Take by mouth every evening.      azelastine (ASTELIN) 0.1 % nasal spray Place 1 spray into both nostrils 2 (two) times daily. Use in each nostril as directed 30 mL 1   b complex vitamins capsule Take 1 capsule by mouth daily.     cetirizine (ZYRTEC) 5 MG tablet Take 1 tablet (5 mg total) by mouth daily. 30 tablet 0   clopidogrel (PLAVIX) 75 MG tablet Take 1 tablet (75 mg total) by mouth daily. 90 tablet 3   dapagliflozin propanediol (FARXIGA) 10 MG TABS tablet Take 1 tablet (10 mg total) by mouth daily. 30 tablet 11   Evolocumab (REPATHA) 140 MG/ML SOSY Inject 140 mg into the skin every 14 (fourteen) days. 2.1 mL 3   ezetimibe (ZETIA) 10 MG tablet Take 1 tablet (10 mg total) by mouth daily. 90 tablet 3   fluorouracil (EFUDEX) 5 % cream Apply topically 2 (two) times daily for 14 days. To the face, nose and forehead up to the hairline. 40 g 0   FLUoxetine (PROZAC) 40 MG capsule Take 1 capsule (40 mg total) by mouth daily. 90 capsule 3   furosemide (LASIX) 40 MG tablet Take 1 tablet (40 mg total) by mouth as needed for edema. 3 lb weight gain overnight. 30 tablet 5   Glucose Blood (BLOOD GLUCOSE TEST STRIPS) STRP  Use to check glucose twice daily 100 strip 11   glucose blood test strip Frequency:PHARMDIR   Dosage:0.0     Instructions:  Note:testing 3-4xqd, DX Code 250.00 Dose: 1     insulin degludec (TRESIBA FLEXTOUCH) 100 UNIT/ML FlexTouch Pen Inject 16 Units into the skin at bedtime. 4.5 mL 2   Insulin Pen Needle (BD PEN NEEDLE NANO U/F) 32G X 4 MM MISC Use to check glucose once daily 100 each 6   levothyroxine (SYNTHROID) 50 MCG tablet Take 1 tablet (  50 mcg total) by mouth daily before breakfast. (Patient taking differently: Take 50 mcg by mouth at bedtime.) 90 tablet 3   metFORMIN (GLUCOPHAGE-XR) 500 MG 24 hr tablet Take 1 tablet (500 mg total) by mouth daily with breakfast. Resume on 01/18/23 90 tablet 3   metoprolol succinate (TOPROL-XL) 25 MG 24 hr tablet Take 1 tablet (25 mg total) by mouth daily. 90 tablet 3   nitroGLYCERIN (NITROSTAT) 0.4 MG SL tablet Place 1 tablet (0.4 mg total) under the tongue every 5 (five) minutes as needed for chest pain. 25 tablet 3   sacubitril-valsartan (ENTRESTO) 24-26 MG Take 1 tablet by mouth 2 (two) times daily. 180 tablet 3   sodium chloride (OCEAN) 0.65 % SOLN nasal spray Place 1 spray into both nostrils as needed for congestion. 30 mL 0   spironolactone (ALDACTONE) 25 MG tablet Take 0.5 tablets (12.5 mg total) by mouth daily. 45 tablet 3   Study - LIBREXIA-ACS - milvexian 25 mg or placebo tablet (PI-Stuckey) Take 1 tablet by mouth 2 (two) times daily. Take 1 tablet by mouth twice a day with or without food. Bring bottle back to every visit. Please contact  cardiology for any questions or concerns regarding the medication 280 tablet 0   Vitamin D, Ergocalciferol, (DRISDOL) 1.25 MG (50000 UNIT) CAPS capsule Take 1 capsule (50,000 Units total) by mouth every 7 (seven) days. 10 capsule 1   No facility-administered medications prior to visit.    Allergies  Allergen Reactions   Propoxyphene Nausea And Vomiting    Other reaction(s): Vomiting    Lidocaine-Epinephrine (Pf)     Other reaction(s): Other (See Comments) palpitatins and chest pain   Other Other (See Comments)    sutures - in her hand-very irritated   Tape Rash    BANDAIDS. BANDAIDS   Xylocaine  [Lidocaine Hcl]     Other reaction(s): Other (See Comments) palpitatins and chest pain   Statins     Muscle pain and weakness in legs   Jardiance [Empagliflozin] Other (See Comments)    Recurrent vaginal infections    Review of Systems  Constitutional:  Negative for chills and fever.  Eyes:  Negative for visual disturbance.  Respiratory:  Negative for chest tightness and shortness of breath.   Genitourinary:  Positive for dysuria, frequency and urgency.  Neurological:  Negative for dizziness and headaches.       Objective:    Physical Exam HENT:     Head: Normocephalic.     Mouth/Throat:     Mouth: Mucous membranes are moist.  Cardiovascular:     Rate and Rhythm: Normal rate.     Heart sounds: Normal heart sounds.  Pulmonary:     Effort: Pulmonary effort is normal.     Breath sounds: Normal breath sounds.  Abdominal:     Tenderness: There is no abdominal tenderness. There is no right CVA tenderness or left CVA tenderness.  Neurological:     Mental Status: She is alert.     BP 110/69   Pulse 63   Ht 5\' 6"  (1.676 m)   Wt 164 lb 1.9 oz (74.4 kg)   SpO2 98%   BMI 26.49 kg/m  Wt Readings from Last 3 Encounters:  09/05/23 164 lb 1.9 oz (74.4 kg)  09/05/23 164 lb 12.8 oz (74.8 kg)  08/09/23 163 lb 6.4 oz (74.1 kg)       Assessment & Plan:  Cystitis Assessment & Plan:  Please ensure you complete the full course of the  antibiotics as prescribed.  To help prevent future urinary tract infections (UTIs), consider the following practices:  Avoid Prolonged Urination: Do not hold urine for extended periods, as this can stretch the bladder and create an environment conducive to bacterial growth. Prompt Bladder Emptying: Empty your bladder as soon as you  feel the urge. Post-Intercourse Hygiene: Empty your bladder soon after intercourse to reduce the risk of infection. Shower Instead of Bath: Opt for showers rather than baths to minimize bacterial exposure. Proper Wiping Technique: Wipe from front to back after urinating and bowel movements to prevent the transfer of bacteria from the anal region to the vagina and urethra. Hydration: Drink a full glass of water regularly to help flush bacteria from your system.   Orders: -     Urinalysis -     Urine Culture -     Nitrofurantoin Monohyd Macro; Take 1 capsule (100 mg total) by mouth 2 (two) times daily for 5 days.  Dispense: 10 capsule; Refill: 0   Note: This chart has been completed using Engineer, civil (consulting) software, and while attempts have been made to ensure accuracy, certain words and phrases may not be transcribed as intended.   Gilmore Laroche, FNP

## 2023-09-05 NOTE — Progress Notes (Signed)
Endocrinology Follow Up Note       09/05/2023, 11:20 AM   Subjective:    Patient ID: Angela Rose, female    DOB: [birth year].  Angela Rose is being seen in follow up after being seen in consultation for management of currently uncontrolled symptomatic diabetes requested by  Billie Lade, MD.   Past Medical History:  Diagnosis Date   Anxiety 07/10/2014   Basal cell carcinoma    Cancer (HCC) 12/13/2011   melanoma face   CHF (congestive heart failure), NYHA class II, acute on chronic, combined (HCC) 01/14/2023   Chronic kidney disease (CKD), stage III (moderate) (HCC) 02/17/2022   Chronic sinusitis 07/02/2022   Depression 07/10/2014   Diabetes mellitus without complication (HCC) 08/21/2014   Fracture of tibial plateau 12/31/2015   Hyperlipidemia 08/14/2015   Hypertension 09/03/2014   Hypothyroidism, postablative 02/08/2014   Melanoma (HCC)    Myocardial infarction (HCC) 1993   Tick bites 10/29/2014   Vitamin D deficiency 12/08/2022    Past Surgical History:  Procedure Laterality Date   ABDOMINAL HYSTERECTOMY     APPENDECTOMY     BASAL CELL CARCINOMA EXCISION     BREAST BIOPSY Right    needle bx-neg   CATARACT EXTRACTION, BILATERAL Bilateral 11/23/2018   CORONARY ARTERY BYPASS GRAFT  1996   x 3   CORONARY/GRAFT ACUTE MI REVASCULARIZATION N/A 01/13/2023   Procedure: Coronary/Graft Acute MI Revascularization;  Surgeon: Marykay Lex, MD;  Location: [facility] INVASIVE CV LAB;  Service: Cardiovascular;  Laterality: N/A;   LEFT HEART CATH AND CORONARY ANGIOGRAPHY N/A 01/13/2023   Procedure: LEFT HEART CATH AND CORONARY ANGIOGRAPHY;  Surgeon: Marykay Lex, MD;  Location: [facility] INVASIVE CV LAB;  Service: Cardiovascular;  Laterality: N/A;   MELANOMA EXCISION     PARTIAL HYSTERECTOMY  1976   ovaries remain    Social History   Socioeconomic History   Marital status: Significant Other     Spouse name: Not on file   Number of children: 3   Years of education: Not on file   Highest education level: Some college, no degree  Occupational History   Occupation: retired  Tobacco Use   Smoking status: Former    Current packs/day: 0.00    Types: Cigarettes    Quit date: 06/11/1982    Years since quitting: 41.2   Smokeless tobacco: Never   Tobacco comments:    smoking cessation materials not required  Vaping Use   Vaping status: Never Used  Substance and Sexual Activity   Alcohol use: Yes    Alcohol/week: 0.0 standard drinks of alcohol    Comment: rare 1 wine glass of wine   Drug use: No   Sexual activity: Not on file  Other Topics Concern   Not on file  Social History Narrative   Not on file   Social Determinants of Health   Financial Resource Strain: Low Risk  (01/14/2023)   Overall Financial Resource Strain (CARDIA)    Difficulty of Paying Living Expenses: Not very hard  Food Insecurity: No Food Insecurity (01/13/2023)   Hunger Vital Sign    Worried About Running Out of Food in the Last Year: Never true  Ran Out of Food in the Last Year: Never true  Transportation Needs: No Transportation Needs (01/13/2023)   PRAPARE - Administrator, Civil Service (Medical): No    Lack of Transportation (Non-Medical): No  Physical Activity: Inactive (12/14/2021)   Exercise Vital Sign    Days of Exercise per Week: 0 days    Minutes of Exercise per Session: 0 min  Stress: Stress Concern Present (12/14/2021)   [employer] of Occupational Health - Occupational Stress Questionnaire    Feeling of Stress : To some extent  Social Connections: Moderately Isolated (12/14/2021)   Social Connection and Isolation Panel [NHANES]    Frequency of Communication with Friends and Family: More than three times a week    Frequency of Social Gatherings with Friends and Family: Once a week    Attends Religious Services: Never    Database administrator or Organizations: No    Attends Probation officer: Never    Marital Status: Living with partner    Family History  Problem Relation Age of Onset   Skin cancer Mother    Squamous cell carcinoma Mother    Basal cell carcinoma Mother    Heart disease Father    CAD Father    Breast cancer Neg Hx     Outpatient Encounter Medications as of 09/05/2023  Medication Sig   aspirin 81 MG tablet Take by mouth every evening.    azelastine (ASTELIN) 0.1 % nasal spray Place 1 spray into both nostrils 2 (two) times daily. Use in each nostril as directed   b complex vitamins capsule Take 1 capsule by mouth daily.   cetirizine (ZYRTEC) 5 MG tablet Take 1 tablet (5 mg total) by mouth daily.   clopidogrel (PLAVIX) 75 MG tablet Take 1 tablet (75 mg total) by mouth daily.   dapagliflozin propanediol (FARXIGA) 10 MG TABS tablet Take 1 tablet (10 mg total) by mouth daily.   Evolocumab (REPATHA) 140 MG/ML SOSY Inject 140 mg into the skin every 14 (fourteen) days.   ezetimibe (ZETIA) 10 MG tablet Take 1 tablet (10 mg total) by mouth daily.   fluorouracil (EFUDEX) 5 % cream Apply topically 2 (two) times daily for 14 days. To the face, nose and forehead up to the hairline.   FLUoxetine (PROZAC) 40 MG capsule Take 1 capsule (40 mg total) by mouth daily.   furosemide (LASIX) 40 MG tablet Take 1 tablet (40 mg total) by mouth as needed for edema. 3 lb weight gain overnight.   Glucose Blood (BLOOD GLUCOSE TEST STRIPS) STRP Use to check glucose twice daily   glucose blood test strip Frequency:PHARMDIR   Dosage:0.0     Instructions:  Note:testing 3-4xqd, DX Code 250.00 Dose: 1   insulin degludec (TRESIBA FLEXTOUCH) 100 UNIT/ML FlexTouch Pen Inject 16 Units into the skin at bedtime.   Insulin Pen Needle (BD PEN NEEDLE NANO U/F) 32G X 4 MM MISC Use to check glucose once daily   levothyroxine (SYNTHROID) 50 MCG tablet Take 1 tablet (50 mcg total) by mouth daily before breakfast. (Patient taking differently: Take 50 mcg by mouth at bedtime.)    metFORMIN (GLUCOPHAGE-XR) 500 MG 24 hr tablet Take 1 tablet (500 mg total) by mouth daily with breakfast. Resume on 01/18/23   metoprolol succinate (TOPROL-XL) 25 MG 24 hr tablet Take 1 tablet (25 mg total) by mouth daily.   nitroGLYCERIN (NITROSTAT) 0.4 MG SL tablet Place 1 tablet (0.4 mg total) under the tongue every 5 (five)  minutes as needed for chest pain.   sacubitril-valsartan (ENTRESTO) 24-26 MG Take 1 tablet by mouth 2 (two) times daily.   sodium chloride (OCEAN) 0.65 % SOLN nasal spray Place 1 spray into both nostrils as needed for congestion.   spironolactone (ALDACTONE) 25 MG tablet Take 0.5 tablets (12.5 mg total) by mouth daily.   Study - LIBREXIA-ACS - milvexian 25 mg or placebo tablet (PI-Stuckey) Take 1 tablet by mouth 2 (two) times daily. Take 1 tablet by mouth twice a day with or without food. Bring bottle back to every visit. Please contact [facility] for any questions or concerns regarding the medication   Vitamin D, Ergocalciferol, (DRISDOL) 1.25 MG (50000 UNIT) CAPS capsule Take 1 capsule (50,000 Units total) by mouth every 7 (seven) days.   No facility-administered encounter medications on file as of 09/05/2023.    ALLERGIES: Allergies  Allergen Reactions   Propoxyphene Nausea And Vomiting    Other reaction(s): Vomiting   Lidocaine-Epinephrine (Pf)     Other reaction(s): Other (See Comments) palpitatins and chest pain   Other Other (See Comments)    sutures - in her hand-very irritated   Tape Rash    BANDAIDS. BANDAIDS   Xylocaine  [Lidocaine Hcl]     Other reaction(s): Other (See Comments) palpitatins and chest pain   Statins     Muscle pain and weakness in legs   Jardiance [Empagliflozin] Other (See Comments)    Recurrent vaginal infections    VACCINATION STATUS: Immunization History  Administered Date(s) Administered   Fluad Quad(high Dose 65+) 07/25/2019, 08/20/2020, 09/30/2021, 09/07/2022   Fluad Trivalent(High Dose 65+) 06/14/2023   Influenza,  High Dose Seasonal PF 06/20/2017, 06/23/2018   Influenza,inj,Quad PF,6+ Mos 08/14/2015, 09/27/2016   PFIZER(Purple Top)SARS-COV-2 Vaccination 12/01/2019, 12/23/2019, 07/20/2020, 04/24/2021   Pneumococcal Conjugate-13 02/16/2016   Pneumococcal Polysaccharide-23 11/15/2008, 10/14/2014    Diabetes She presents for her follow-up diabetic visit. She has type 2 diabetes mellitus. Onset time: Diagnosed at approx age of 37. Her disease course has been worsening. There are no hypoglycemic associated symptoms. Pertinent negatives for diabetes include no fatigue, no polyuria and no weight loss. There are no hypoglycemic complications. Symptoms are stable. Diabetic complications include heart disease and nephropathy. Risk factors for coronary artery disease include diabetes mellitus, dyslipidemia, family history, post-menopausal and sedentary lifestyle. Current diabetic treatment includes insulin injections and oral agent (dual therapy). She is compliant with treatment most of the time. Her weight is stable. She is following a generally healthy diet. When asked about meal planning, she reported none. She has not had a previous visit with a dietitian. She rarely participates in exercise. Her home blood glucose trend is fluctuating minimally. Her overall blood glucose range is 90-110 mg/dl. (She presents today with her meter showing at goal fasting glycemic profile.  Her POCT A1c today is 8.4%, increasing from last visit of 7.8%.  She notes she was particularly stressed recently and admits to eating more sweets than she knows she should.  Analysis of her meter shows 7-day average of 89 (3 readings); 14-day average of 89 (4 readings); 30-day average of 93 (7 readings).) An ACE inhibitor/angiotensin II receptor blocker is being taken. She sees a podiatrist.Eye exam is current.    Review of systems  Constitutional: + Minimally fluctuating body weight,  current Body mass index is 26.6 kg/m. , no fatigue, no subjective  hyperthermia, no subjective hypothermia Eyes: no blurry vision, no xerophthalmia ENT: no sore throat, no nodules palpated in throat, no dysphagia/odynophagia, no hoarseness  Cardiovascular: no chest pain, no shortness of breath, no palpitations, no leg swelling Respiratory: no cough, no shortness of breath Gastrointestinal: no nausea/vomiting/diarrhea Musculoskeletal: no muscle/joint aches Genitourinary: + polyuria, frequent UTIs Skin: no rashes, no hyperemia Neurological: no tremors, no numbness, no tingling, no dizziness Psychiatric: no depression, no anxiety  Objective:     BP 109/62 (BP Location: Left Arm, Patient Position: Sitting, Cuff Size: Large)   Pulse 67   Ht 5\' 6"  (1.676 m)   Wt 164 lb 12.8 oz (74.8 kg)   BMI 26.60 kg/m   Wt Readings from Last 3 Encounters:  09/05/23 164 lb 12.8 oz (74.8 kg)  08/09/23 163 lb 6.4 oz (74.1 kg)  06/14/23 162 lb 3.2 oz (73.6 kg)     BP Readings from Last 3 Encounters:  09/05/23 109/62  08/09/23 122/70  07/28/23 121/60       Physical Exam- Limited  Constitutional:  Body mass index is 26.6 kg/m. , not in acute distress, normal state of mind Eyes:  EOMI, no exophthalmos Musculoskeletal: no gross deformities, strength intact in all four extremities, no gross restriction of joint movements Skin:  no rashes, no hyperemia Neurological: no tremor with outstretched hands    CMP ( most recent) CMP     Component Value Date/Time   NA 139 09/02/2023 1206   K 5.7 (H) 09/02/2023 1206   CL 103 09/02/2023 1206   CO2 22 09/02/2023 1206   GLUCOSE 141 (H) 09/02/2023 1206   GLUCOSE 166 (H) 01/16/2023 0136   BUN 30 (H) 09/02/2023 1206   CREATININE 1.54 (H) 09/02/2023 1206   CALCIUM 9.0 09/02/2023 1206   PROT 7.3 09/02/2023 1206   ALBUMIN 4.2 09/02/2023 1206   AST 29 09/02/2023 1206   ALT 19 09/02/2023 1206   ALKPHOS 133 (H) 09/02/2023 1206   BILITOT 0.4 09/02/2023 1206   GFRNONAA 40 (L) 01/16/2023 0136   GFRAA 49 (L) 05/02/2020 1017      Diabetic Labs (most recent): Lab Results  Component Value Date   HGBA1C 8.4 (A) 09/05/2023   HGBA1C 7.8 (A) 05/02/2023   HGBA1C 9.4 (H) 01/12/2023     Lipid Panel ( most recent) Lipid Panel     Component Value Date/Time   CHOL 242 (H) 01/12/2023 2339   CHOL 325 (H) 12/06/2022 1116   TRIG 261 (H) 01/12/2023 2339   HDL 57 01/12/2023 2339   HDL 51 12/06/2022 1116   CHOLHDL 4.2 01/12/2023 2339   VLDL 52 (H) 01/12/2023 2339   LDLCALC 133 (H) 01/12/2023 2339   LDLCALC 247 (H) 12/06/2022 1116   LABVLDL 27 12/06/2022 1116      Lab Results  Component Value Date   TSH 2.660 09/02/2023   TSH 2.160 12/27/2022   TSH 2.300 12/06/2022   TSH 1.540 08/03/2022   TSH 3.170 02/10/2022   TSH 1.990 09/30/2021   TSH 6.140 (H) 04/27/2021   TSH 1.850 12/25/2019   TSH 4.690 (H) 12/25/2018   TSH 4.060 05/20/2017   FREET4 1.28 09/02/2023   FREET4 1.39 12/27/2022   FREET4 1.25 12/06/2022   FREET4 1.35 08/03/2022   FREET4 1.35 09/30/2021   FREET4 1.06 12/25/2019           Assessment & Plan:   1) Type 2 diabetes mellitus with stage 3b chronic kidney disease, with long-term current use of insulin (HCC)  She presents today with her meter showing at goal fasting glycemic profile.  Her POCT A1c today is 8.4%, increasing from last visit of 7.8%.  She notes she was particularly stressed recently and admits to eating more sweets than she knows she should.  Analysis of her meter shows 7-day average of 89 (3 readings); 14-day average of 89 (4 readings); 30-day average of 93 (7 readings).  - Angela Rose has currently uncontrolled symptomatic type 2 DM since 77 years of age.   -Recent labs reviewed.  - I had a long discussion with her about the progressive nature of diabetes and the pathology behind its complications. -her diabetes is complicated by CAD with MI and CKD stage 3 and she remains at a high risk for more acute and chronic complications which include CAD, CVA, CKD,  retinopathy, and neuropathy. These are all discussed in detail with her.  The following Lifestyle Medicine recommendations according to American College of Lifestyle Medicine [facility]) were discussed and offered to patient and she agrees to start the journey:  A. Whole Foods, Plant-based plate comprising of fruits and vegetables, plant-based proteins, whole-grain carbohydrates was discussed in detail with the patient.   A list for source of those nutrients were also provided to the patient.  Patient will use only water or unsweetened tea for hydration. B.  The need to stay away from risky substances including alcohol, smoking; obtaining 7 to 9 hours of restorative sleep, at least 150 minutes of moderate intensity exercise weekly, the importance of healthy social connections,  and stress reduction techniques were discussed. C.  A full color page of  Calorie density of various food groups per pound showing examples of each food groups was provided to the patient.  - Nutritional counseling repeated at each appointment due to patients tendency to fall back in to old habits.  - The patient admits there is a room for improvement in their diet and drink choices. -  Suggestion is made for the patient to avoid simple carbohydrates from their diet including Cakes, Sweet Desserts / Pastries, Ice Cream, Soda (diet and regular), Sweet Tea, Candies, Chips, Cookies, Sweet Pastries, Store Bought Juices, Alcohol in Excess of 1-2 drinks a day, Artificial Sweeteners, Coffee Creamer, and "Sugar-free" Products. This will help patient to have stable blood glucose profile and potentially avoid unintended weight gain.   - I encouraged the patient to switch to unprocessed or minimally processed complex starch and increased protein intake (animal or plant source), fruits, and vegetables.   - Patient is advised to stick to a routine mealtimes to eat 3 meals a day and avoid unnecessary snacks (to snack only to correct  hypoglycemia).  - I have approached her with the following individualized plan to manage her diabetes and patient agrees:   - She is advised to continue her Tresiba 16 units SQ nightly, continue her Metformin 500 mg ER daily with breakfast , and continue Farxiga 10 mg po daily (per cardiology).  She does note she has been having more UTIs recently, possibly related to her [place].  We may need to look at alternative for this.  I encouraged patient to discuss with her cardiologist.  -she is encouraged to continue monitoring blood glucose twice daily, before breakfast and before bed, and to call the clinic if she has readings less than 70 or above 300 for 3 tests in a row.   - she is warned not to take insulin without proper monitoring per orders. - Adjustment parameters are given to her for hypo and hyperglycemia in writing.  - she is not an ideal candidate for incretin therapy due to body habitus and BMI  of 25.  - Specific targets for  A1c; LDL, HDL, and Triglycerides were discussed with the patient.  2) Blood Pressure /Hypertension:  her blood pressure is controlled to target.   she is advised to continue her current medications as prescribed by cardiology.  3) Lipids/Hyperlipidemia:    Review of her recent lipid panel from 01/12/23 showed uncontrolled LDL at 133 and elevated triglycerides of 261 .  she is advised to continue Repatha 140 mg every 14 days.  Side effects and precautions discussed with her.  4)  Weight/Diet:  her Body mass index is 26.6 kg/m.  -  she is NOT a candidate for weight loss.  Exercise, and detailed carbohydrates information provided  -  detailed on discharge instructions.  5) Hypothyroidism- RAI induced She was diagnosed with hyperthyroidism over 15 years ago which subsequently led to her RAI ablation and now hypothyroidism.    Her previsit TFTs are consistent with appropriate hormone replacement.  She is advised to continue Levothyroxine 50 mcg po daily before  breakfast.     - The correct intake of thyroid hormone (Levothyroxine, Synthroid), is on empty stomach first thing in the morning, with water, separated by at least 30 minutes from breakfast and other medications,  and separated by more than 4 hours from calcium, iron, multivitamins, acid reflux medications (PPIs).  - This medication is a life-long medication and will be needed to correct thyroid hormone imbalances for the rest of your life.  The dose may change from time to time, based on thyroid blood work.  - It is extremely important to be consistent taking this medication, near the same time each morning.  -AVOID TAKING PRODUCTS CONTAINING BIOTIN (commonly found in Hair, Skin, Nails vitamins) AS IT INTERFERES WITH THE VALIDITY OF THYROID FUNCTION BLOOD TESTS.  6) Chronic Care/Health Maintenance: -she is on ACEI/ARB and not on Statin medications and is encouraged to initiate and continue to follow up with Ophthalmology, Dentist, Podiatrist at least yearly or according to recommendations, and advised to stay away from smoking. I have recommended yearly flu vaccine and pneumonia vaccine at least every 5 years; moderate intensity exercise for up to 150 minutes weekly; and sleep for at least 7 hours a day.  - she is advised to maintain close follow up with Durwin Nora Lucina Mellow, MD for primary care needs, as well as her other providers for optimal and coordinated care.      I spent  37  minutes in the care of the patient today including review of labs from CMP, Lipids, Thyroid Function, Hematology (current and previous including abstractions from other facilities); face-to-face time discussing  her blood glucose readings/logs, discussing hypoglycemia and hyperglycemia episodes and symptoms, medications doses, her options of short and long term treatment based on the latest standards of care / guidelines;  discussion about incorporating lifestyle medicine;  and documenting the encounter. Risk reduction  counseling performed per USPSTF guidelines to reduce obesity and cardiovascular risk factors.     Please refer to Patient Instructions for Blood Glucose Monitoring and Insulin/Medications Dosing Guide"  in media tab for additional information. Please  also refer to " Patient Self Inventory" in the Media  tab for reviewed elements of pertinent patient history.  Angela Rose participated in the discussions, expressed understanding, and voiced agreement with the above plans.  All questions were answered to her satisfaction. she is encouraged to contact clinic should she have any questions or concerns prior to her return visit.     Follow  up plan: - Return in about 4 months (around 01/03/2024) for Diabetes F/U with A1c in office, No previsit labs, Bring meter and logs.   Ronny Bacon, [facility] [address] [place], [place] [address] Phone: [phone] Fax: [phone]  09/05/2023, 11:20 AM

## 2023-09-05 NOTE — Assessment & Plan Note (Signed)
Please ensure you complete the full course of the antibiotics as prescribed.  To help prevent future urinary tract infections (UTIs), consider the following practices:  Avoid Prolonged Urination: Do not hold urine for extended periods, as this can stretch the bladder and create an environment conducive to bacterial growth. Prompt Bladder Emptying: Empty your bladder as soon as you feel the urge. Post-Intercourse Hygiene: Empty your bladder soon after intercourse to reduce the risk of infection. Shower Instead of Bath: Opt for showers rather than baths to minimize bacterial exposure. Proper Wiping Technique: Wipe from front to back after urinating and bowel movements to prevent the transfer of bacteria from the anal region to the vagina and urethra. Hydration: Drink a full glass of water regularly to help flush bacteria from your system.

## 2023-09-05 NOTE — Patient Instructions (Signed)
I appreciate the opportunity to provide care to you today!    Follow up:  pcp  To help prevent future urinary tract infections (UTIs), consider the following practices:  Avoid Prolonged Urination: Do not hold urine for extended periods, as this can stretch the bladder and create an environment conducive to bacterial growth. Prompt Bladder Emptying: Empty your bladder as soon as you feel the urge. Post-Intercourse Hygiene: Empty your bladder soon after intercourse to reduce the risk of infection. Shower Instead of Bath: Opt for showers rather than baths to minimize bacterial exposure. Proper Wiping Technique: Wipe from front to back after urinating and bowel movements to prevent the transfer of bacteria from the anal region to the vagina and urethra. Hydration: Drink a full glass of water regularly to help flush bacteria from your system.      Please continue to a heart-healthy diet and increase your physical activities. Try to exercise for at least five days a week.    It was a pleasure to see you and I look forward to continuing to work together on your health and well-being. Please do not hesitate to call the office if you need care or have questions about your care.  In case of emergency, please visit the Emergency Department for urgent care, or contact our clinic at [phone] to schedule an appointment. We're here to help you!   Have a wonderful day and week. With Gratitude, Gilmore Laroche MSN, FNP-BC

## 2023-09-06 ENCOUNTER — Telehealth: Payer: Self-pay

## 2023-09-06 ENCOUNTER — Ambulatory Visit: Payer: Medicare HMO | Admitting: Internal Medicine

## 2023-09-06 LAB — URINALYSIS
Bilirubin, UA: NEGATIVE
Ketones, UA: NEGATIVE
Nitrite, UA: NEGATIVE
Specific Gravity, UA: 1.01 (ref 1.005–1.030)
Urobilinogen, Ur: 0.2 mg/dL (ref 0.2–1.0)
pH, UA: 6 (ref 5.0–7.5)

## 2023-09-06 LAB — SURGICAL PATHOLOGY

## 2023-09-06 NOTE — Telephone Encounter (Signed)
-----   Message from [facility] PACI sent at 09/06/2023  3:46 PM EST ----- SCCIS within wart- right dorsal hand- Mohs with me   Please call patient to discuss diagnosis and schedule for Mohs surgery.

## 2023-09-06 NOTE — Telephone Encounter (Signed)
Spoke w pt gave her results and treatment recommendations

## 2023-09-07 ENCOUNTER — Ambulatory Visit: Payer: Self-pay | Admitting: Internal Medicine

## 2023-09-07 NOTE — Telephone Encounter (Signed)
Copied from CRM [id number]. Topic: Clinical - Medication Refill >> Sep 07, 2023  4:02 PM Louie Casa B wrote: Most Recent Primary Care Visit:  Provider: Gilmore Laroche  Department: [facility]  Visit Type: OFFICE VISIT  Date: 09/05/2023  Medication: pt husband called in and said the the rx nitrofurantoin 100 mg is too strong for the pt and would like to switch to something else   Has the patient contacted their pharmacy? Yes (Agent: If no, request that the patient contact the pharmacy for the refill. If patient does not wish to contact the pharmacy document the reason why and proceed with request.) (Agent: If yes, when and what did the pharmacy advise?)  Is this the correct pharmacy for this prescription? Yes If no, delete pharmacy and type the correct one.  This is the patient's preferred pharmacy:  [facility] [phone] - [place], [place] - [address] AT [facility] [address]  [place] [address] Phone: [phone] Fax: [phone]  [facility] [address] [place] [address] Phone: [phone] Fax: [phone]   Has the prescription been filled recently? Yes  Is the patient out of the medication? No  Has the patient been seen for an appointment in the last year OR does the patient have an upcoming appointment? yes  Can we respond through MyChart? No  Agent: Please be advised that Rx refills may take up to 3 business days. We ask that you follow-up with your pharmacy.  Chief Complaint: Antibiotic too strong Symptoms: nausea and vomiting Frequency: vomited one time today Pertinent Negatives: Patient denies rash, itching, hives Disposition: [] ED /[] Urgent Care (no appt availability in office) / [] Appointment(In office/virtual)/ []  [facility]/ [] Home Care/ [] Refused Recommended Disposition /[] [facility]/ [x]  Follow-up with PCP Additional Notes: Patient's husband called  in d/t antibiotic being too strong for his wife and making her sick.  She is on nitrofurantoin 100 mg.  Husband states she took it one time last night and vomited this am. Please call husband to advise on continuing medication or discontinuing.   Reason for Disposition  Prescription request for new medicine (not a refill)  Answer Assessment - Initial Assessment Questions 1. NAME of MEDICINE: "What medicine(s) are you calling about?"     Nitrofurantoin  2. QUESTION: "What is your question?" (e.g., double dose of medicine, side effect)     Wants new medication because this one is too strong 3. PRESCRIBER: "Who prescribed the medicine?" Reason: if prescribed by specialist, call should be referred to that group.     Dr. Durwin Nora for UTI 4. SYMPTOMS: "Do you have any symptoms?" If Yes, ask: "What symptoms are you having?"  "How bad are the symptoms (e.g., mild, moderate, severe)     Makes her feel sick, vomiting, nausea  Protocols used: Medication Question Call-A-AH

## 2023-09-09 LAB — URINE CULTURE

## 2023-09-12 ENCOUNTER — Other Ambulatory Visit: Payer: Self-pay | Admitting: Family Medicine

## 2023-09-12 DIAGNOSIS — N3090 Cystitis, unspecified without hematuria: Secondary | ICD-10-CM

## 2023-09-12 MED ORDER — CEPHALEXIN 500 MG PO CAPS: 500.0000 mg | ORAL_CAPSULE | Freq: Two times a day (BID) | ORAL | 0 refills | Status: AC

## 2023-09-12 NOTE — Telephone Encounter (Signed)
A prescription for Keflex 500 mg to be taken twice daily for 3 days has been sent to her pharmacy.

## 2023-09-12 NOTE — Telephone Encounter (Signed)
Refer to pcp or [facility]

## 2023-09-14 DIAGNOSIS — R3981 Functional urinary incontinence: Secondary | ICD-10-CM | POA: Diagnosis not present

## 2023-09-14 DIAGNOSIS — E119 Type 2 diabetes mellitus without complications: Secondary | ICD-10-CM | POA: Diagnosis not present

## 2023-09-19 ENCOUNTER — Encounter: Payer: Self-pay | Admitting: Dermatology

## 2023-09-20 ENCOUNTER — Other Ambulatory Visit: Payer: Self-pay | Admitting: Family Medicine

## 2023-09-20 ENCOUNTER — Telehealth: Payer: Self-pay

## 2023-09-20 ENCOUNTER — Other Ambulatory Visit: Payer: Self-pay | Admitting: Internal Medicine

## 2023-09-20 DIAGNOSIS — E1169 Type 2 diabetes mellitus with other specified complication: Secondary | ICD-10-CM

## 2023-09-20 DIAGNOSIS — N3001 Acute cystitis with hematuria: Secondary | ICD-10-CM

## 2023-09-20 MED ORDER — CIPROFLOXACIN HCL 250 MG PO TABS: 250.0000 mg | ORAL_TABLET | Freq: Two times a day (BID) | ORAL | 0 refills | Status: AC

## 2023-09-20 NOTE — Telephone Encounter (Signed)
Copied from CRM [id number]. Topic: Clinical - Prescription Issue >> Sep 20, 2023  3:39 PM Gaetano Hawthorne wrote: Reason for CRM: Received call from pt distressed regarding treatment of care. Pt stated still has UTI. Pt would like the antibiotics that Dr. Durwin Nora previously prescribed for pt - the most recent medication has caused her to vomit. Her endocrinologist stated she wanted her metFORMIN (GLUCOPHAGE-XR) 500 MG 24 hr tablet dosage less than 500mg . Dr. Durwin Nora please call pt at your earliest convenience.

## 2023-09-21 ENCOUNTER — Encounter: Payer: Self-pay | Admitting: Dermatology

## 2023-09-21 ENCOUNTER — Ambulatory Visit: Payer: Medicare HMO | Admitting: Dermatology

## 2023-09-21 VITALS — BP 97/43 | HR 66 | Temp 97.5°F

## 2023-09-21 DIAGNOSIS — L814 Other melanin hyperpigmentation: Secondary | ICD-10-CM

## 2023-09-21 DIAGNOSIS — D0462 Carcinoma in situ of skin of left upper limb, including shoulder: Secondary | ICD-10-CM

## 2023-09-21 DIAGNOSIS — C4492 Squamous cell carcinoma of skin, unspecified: Secondary | ICD-10-CM

## 2023-09-21 DIAGNOSIS — L579 Skin changes due to chronic exposure to nonionizing radiation, unspecified: Secondary | ICD-10-CM

## 2023-09-21 MED ORDER — MUPIROCIN 2 % EX OINT: 1.0000 | TOPICAL_OINTMENT | Freq: Every day | CUTANEOUS | 0 refills | Status: DC

## 2023-09-21 NOTE — Progress Notes (Signed)
Follow-Up Visit   Subjective  Angela Rose is a 77 y.o. female who presents for the following: Mohs of an SCCIS on pt left dorsal hand. Patient is accompanied by her husband.  The following portions of the chart were reviewed this encounter and updated as appropriate: medications, allergies, medical history  Review of Systems:  No other skin or systemic complaints except as noted in HPI or Assessment and Plan.  Objective  Well appearing patient in no apparent distress; mood and affect are within normal limits.  A focused examination was performed of the following areas:  Left dorsal hand  Relevant physical exam findings are noted in the Assessment and Plan.   Left Dorsal Hand           Assessment & Plan   Squamous cell carcinoma of skin Left Dorsal Hand  Mohs surgery  Consent obtained: written  Anticoagulation: Is the patient taking prescription anticoagulant and/or aspirin prescribed/recommended by a physician? Yes   Was the anticoagulation regimen changed prior to Mohs? No    Procedure Details: Timeout: pre-procedure verification complete Procedure Prep: patient was prepped and draped in usual sterile fashion Prep type: chlorhexidine Biopsy accession number: [id number] Biopsy lab: [facility] Date of biopsy: 09/01/2023 Frozen section biopsy performed: No   Specimen debulked: No   Pre-Op diagnosis: squamous cell carcinoma SCC subtype: none MohsAIQ Surgical site (if tumor spans multiple areas, please select predominant area): hand Surgery side: left Surgical site (from skin exam): Left Dorsal Hand Pre-operative length (cm): 2.2 Pre-operative width (cm): 2 Indications for Mohs surgery: anatomic location where tissue conservation is critical Previously treated? No    Micrographic Surgery Details: Post-operative length (cm): 2.9 Post-operative width (cm): 2.5 Number of Mohs stages: 1 Is this a complex case (associate members only): No     Stage 1    Tumor features identified on Mohs section: no tumor identified    Depth of defect after stage: dermis    Perineural invasion: no perineural invasion  Patient tolerance of procedure: tolerated well, no immediate complications  Reconstruction: Was the defect reconstructed? Yes   Was reconstruction performed by the same Mohs surgeon? Yes   Setting of reconstruction: outpatient office  Opioids: Did the patient recieve a prescription for opioid/narcotic related to Mohs surgery? Yes   Indications for opioid/narcotics: patient required additional pain relief despite trial of non-opioid analgesia  Antibiotics: Does patient meet AHA guidelines for endocarditis?: No   Does patient meet AHA guidelines for orthopedic prophylaxis?: No   Were antibiotics given on the day of surgery?: No   Did surgery breach mucosa, expose cartilage/bone, involve an area of lymphedema/inflamed/infected tissue? No    Skin repair Complexity:  Complex Final length (cm):  8.6 Informed consent: discussed and consent obtained   Timeout: patient name, date of birth, surgical site, and procedure verified   Procedure prep:  Patient was prepped and draped in usual sterile fashion Prep type:  Chlorhexidine Anesthesia: the lesion was anesthetized in a standard fashion   Anesthetic:  1% lidocaine w/ epinephrine 1-100,000 buffered w/ 8.4% NaHCO3 Reason for type of repair: reduce tension to allow closure and preserve normal anatomy   Undermining: edges undermined   Subcutaneous layers (deep stitches):  Suture size:  4-0 Suture type: Vicryl (polyglactin 910)   Stitches:  Buried horizontal mattress Fine/surface layer approximation (top stitches):  Suture size:  5-0 Suture type: Prolene (polypropylene)   Stitches: simple interrupted   Suture removal (days):  14 Hemostasis achieved with: suture, pressure and  electrodesiccation Outcome: patient tolerated procedure well with no complications   Post-procedure  details: sterile dressing applied and wound care instructions given   Dressing type: petrolatum and pressure dressing      Return in about 2 weeks (around 10/05/2023) for suture removal.  I, Bernette Redbird, Surg Tech III, am acting as scribe for Gwenith Daily, MD.    09/21/2023  HISTORY OF PRESENT ILLNESS  Angela Rose is seen in consultation at the request of Dr. Caralyn Guile for biopsy-proven Ethelean Colla. They note that the area has been present for about 6 months increasing in size with time.  There is no history of previous treatment.  Reports no other new or changing lesions and has no other complaints today.  Medications and allergies: see patient chart.  Review of systems: Reviewed 8 systems and notable for the above skin cancer.  All other systems reviewed are unremarkable/negative, unless noted in the HPI. Past medical history, surgical history, family history, social history were also reviewed and are noted in the chart/questionnaire.    PHYSICAL EXAMINATION  General: Well-appearing, in no acute distress, alert and oriented x 4. Vitals reviewed in chart (if available).   Skin: Exam reveals a 2.2 x 2.0 cm erythematous papule and biopsy scar on the left dorsal hand. There are rhytids, telangiectasias, and lentigines, consistent with photodamage.  Biopsy report(s) reviewed, confirming the diagnosis.   ASSESSMENT  1) Squamous Cell Carcinoma in Situ of the left dorsal hand 2) photodamage 3) solar lentigines   PLAN   1. Due to location, size, histology, or recurrence and the likelihood of subclinical extension as well as the need to conserve normal surrounding tissue, the patient was deemed acceptable for Mohs micrographic surgery (MMS).  The nature and purpose of the procedure, associated benefits and risks including recurrence and scarring, possible complications such as pain, infection, and bleeding, and alternative methods of treatment if appropriate were discussed with the patient  during consent. The lesion location was verified by the patient, by reviewing previous notes, pathology reports, and by photographs as well as angulation measurements if available.  Informed consent was reviewed and signed by the patient, and timeout was performed at 10:30 AM. See op note below.  2. For the photodamage and solar lentigines, sun protection discussed/information given on OTC sunscreens, and we recommend continued regular follow-up with primary dermatologist every 6 months or sooner for any growing, bleeding, or changing lesions. 3. Prognosis and future surveillance discussed. 4. Letter with treatment outcome sent to referring provider. 5. Pain acetaminophen/ibuprofen  MOHS MICROGRAPHIC SURGERY AND RECONSTRUCTION  Initial size:   2.2 x 2.0 cm Surgical defect/wound size: 2.9 x 2.5 cm Anesthesia:    0.33% lidocaine with 1:200,000 epinephrine EBL:    <5 mL Complications:  None Repair type:   FTSG (Burow's Graft) SQ suture:   4-0 Vicryl Cutaneous suture:  6-0 Plain gut Final size of the repair: 5.7 x 1.5 cm  Stages: 1  STAGE I: Anesthesia achieved with 0.5% lidocaine with 1:200,000 epinephrine. ChloraPrep applied. 1 section(s) excised using Mohs technique (this includes total peripheral and deep tissue margin excision and evaluation with frozen sections, excised and interpreted by the same physician). The tumor was first debulked and then excised with an approx. 2mm margin.  Hemostasis was achieved with electrocautery as needed.  The specimen was then oriented, subdivided/relaxed, inked, and processed using Mohs technique.    Frozen section analysis revealed a clear deep and peripheral margin  Reconstruction  Operation:  Full thickness skin graft  repair of the above MMS defect Indication:  Functional and aesthetic reconstruction of the above wound Donor site:  Burow's Triangle Donor site suture: 4-0 Vicryl     FTSG suture:  5-0 Prolene Graft length and width: 5.7 x 1.5  cm Graft surface area: 8.55 cm2 Postoperative medications: None Complications:  None  Due to the size, depth, and location, apposition could not be obtained without significant tension and distortion of adjacent tissue and structures. We discussed repair options including graft, flap, and second intention. We chose to proceed with a a FTSG (Burow's).  Therefore, a template/measurement of the defect was made to pattern the excision in the donor site that was then aseptically prepped, anesthetized, and excised.  The donor defect was closed without significant tension.  Hemostasis was achieved with electrocautery.  The graft was then defatted, trimmed, and placed on the recipient site.  The circumference of the graft was secured with the above suture.  Internal bolstering stitches were placed as needed.  The surgical site was then lightly scrubbed with sterile, saline-soaked gauze.  The area was then bandaged using Vaseline ointment, non-adherent gauze, gauze pads, and tape to provide an adequate pressure dressing.   The patient tolerated the procedure well, was given detailed written and verbal wound care instructions, and was discharged in good condition.  Sun protection and sun avoidance was also discussed. The patient will follow-up in 2 weeka.  - mupirocin ointment (BACTROBAN) 2 %; Apply 1 Application topically daily. To wound under a bandage  Dispense: 22 g; Refill: 0  Documentation: I have reviewed the above documentation for accuracy and completeness, and I agree with the above.  Gwenith Daily, MD

## 2023-09-21 NOTE — Patient Instructions (Signed)
Wound Care Instructions for After Surgery  On the day following your surgery, you should begin doing daily dressing changes until your sutures are removed: Remove the bandage. Cleanse the wound gently with soap and water.  Make sure you then dry the skin surrounding the wound completely or the tape will not stick to the skin. Do not use cotton balls on the wound. After the wound is clean and dry, apply the ointment (either prescription antibiotic prescribed by your doctor or plain Vaseline if nothing was prescribed) gently with a Q-tip. If you are using a bandaid to cover: Apply a bandaid large enough to cover the entire wound. If you do not have a bandaid large enough to cover the wound OR if you are sensitive to bandaid adhesive: Cut a non-stick pad (such as Telfa) to fit the size of the wound.  Cover the wound with the non-stick pad. If the wound is draining, you may want to add a small amount of gauze on top of the non-stick pad for a little added compression to the area. Use tape to seal the area completely.  For the next 1-2 weeks: Be sure to keep the wound moist with ointment 24/7 to ensure best healing. If you are unable to cover the wound with a bandage to hold the ointment in place, you may need to reapply the ointment several times a day. Do not bend over or lift heavy items to reduce the chance of elevated blood pressure to the wound. Do not participate in particularly strenuous activities.  Below is a list of dressing supplies you might need.  Cotton-tipped applicators - Q-tips Gauze pads (2x2 and/or 4x4) - All-Purpose Sponges New and clean tube of petroleum jelly (Vaseline) OR prescription antibiotic ointment if prescribed Either a bandaid large enough to cover the entire wound OR non-stick dressing material (Telfa) and Tape (Paper or Hypafix)  FOR ADULT SURGERY PATIENTS: If you need something for pain relief, you may take 1 extra strength Tylenol (acetaminophen) and 2  ibuprofen (200 mg) together every 4 hours as needed. (Do not take these medications if you are allergic to them or if you know you cannot take them for any other reason). Typically you may only need pain medication for 1-3 days.   Comments on the Post-Operative Period Slight swelling and redness often appear around the wound. This is normal and will disappear within several days following the surgery. The healing wound will drain a brownish-red-yellow discharge during healing. This is a normal phase of wound healing. As the wound begins to heal, the drainage may increase in amount. Again, this drainage is normal. Notify us if the drainage becomes persistently bloody, excessively swollen, or intensely painful or develops a foul odor or red streaks.  The healing wound will also typically be itchy. This is normal. If you have severe or persistent pain, Notify us if the discomfort is severe or persistent. Avoid alcoholic beverages when taking pain medicine.  In Case of Wound Hemorrhage A wound hemorrhage is when the bandage suddenly becomes soaked with bright red blood and flows profusely. If this happens, sit down or lie down with your head elevated. If the wound has a dressing on it, do not remove the dressing. Apply pressure to the existing gauze. If the wound is not covered, use a gauze pad to apply pressure and continue applying the pressure for 20 minutes without peeking. DO NOT COVER THE WOUND WITH A LARGE TOWEL OR WASH CLOTH. Release your hand from the  wound site but do not remove the dressing. If the bleeding has stopped, gently clean around the wound. Leave the dressing in place for 24 hours if possible. This wait time allows the blood vessels to close off so that you do not spark a new round of bleeding by disrupting the newly clotted blood vessels with an immediate dressing change. If the bleeding does not subside, continue to hold pressure for 40 minutes. If bleeding continues, page your  physician, contact an After Hours clinic or go to the Emergency Room.    Important Information  Due to recent changes in healthcare laws, you may see results of your pathology and/or laboratory studies on MyChart before the doctors have had a chance to review them. We understand that in some cases there may be results that are confusing or concerning to you. Please understand that not all results are received at the same time and often the doctors may need to interpret multiple results in order to provide you with the best plan of care or course of treatment. Therefore, we ask that you please give [place] 2 business days to thoroughly review all your results before contacting the office for clarification. Should we see a critical lab result, you will be contacted sooner.   If You Need Anything After Your Visit  If you have any questions or concerns for your doctor, please call our main line at [phone] If no one answers, please leave a voicemail as directed and we will return your call as soon as possible. Messages left after 4 pm will be answered the following business day.   You may also send [place] a message via MyChart. We typically respond to MyChart messages within 1-2 business days.  For prescription refills, please ask your pharmacy to contact our office. Our fax number is [phone].  If you have an urgent issue when the clinic is closed that cannot wait until the next business day, you can page your doctor at the number below.    Please note that while we do our best to be available for urgent issues outside of office hours, we are not available 24/7.   If you have an urgent issue and are unable to reach [place], you may choose to seek medical care at your doctor's office, retail clinic, urgent care center, or emergency room.  If you have a medical emergency, please immediately call 911 or go to the emergency department. In the event of inclement weather, please call our main line at  [phone] for an update on the status of any delays or closures.  Dermatology Medication Tips: Please keep the boxes that topical medications come in in order to help keep track of the instructions about where and how to use these. Pharmacies typically print the medication instructions only on the boxes and not directly on the medication tubes.   If your medication is too expensive, please contact our office at [phone] or send [place] a message through MyChart.   We are unable to tell what your co-pay for medications will be in advance as this is different depending on your insurance coverage. However, we may be able to find a substitute medication at lower cost or fill out paperwork to get insurance to cover a needed medication.   If a prior authorization is required to get your medication covered by your insurance company, please allow [place] 1-2 business days to complete this process.  Drug prices often vary depending on where the prescription is filled and  some pharmacies may offer cheaper prices.  The website www.goodrx.com contains coupons for medications through different pharmacies. The prices here do not account for what the cost may be with help from insurance (it may be cheaper with your insurance), but the website can give you the price if you did not use any insurance.  - You can print the associated coupon and take it with your prescription to the pharmacy.  - You may also stop by our office during regular business hours and pick up a GoodRx coupon card.  - If you need your prescription sent electronically to a different pharmacy, notify our office through [facility] or by phone at [phone]

## 2023-09-22 ENCOUNTER — Encounter: Payer: Self-pay | Admitting: Dermatology

## 2023-09-27 ENCOUNTER — Other Ambulatory Visit: Payer: Self-pay

## 2023-09-27 DIAGNOSIS — E785 Hyperlipidemia, unspecified: Secondary | ICD-10-CM

## 2023-09-27 MED ORDER — REPATHA SURECLICK 140 MG/ML ~~LOC~~ SOAJ
140.0000 mg | SUBCUTANEOUS | 12 refills | Status: DC
Start: 2023-09-27 — End: 2023-10-18

## 2023-09-28 ENCOUNTER — Ambulatory Visit (INDEPENDENT_AMBULATORY_CARE_PROVIDER_SITE_OTHER): Payer: Medicare HMO | Admitting: Podiatry

## 2023-09-28 ENCOUNTER — Encounter: Payer: Self-pay | Admitting: Podiatry

## 2023-09-28 DIAGNOSIS — E118 Type 2 diabetes mellitus with unspecified complications: Secondary | ICD-10-CM

## 2023-09-28 DIAGNOSIS — M79675 Pain in left toe(s): Secondary | ICD-10-CM

## 2023-09-28 DIAGNOSIS — M79674 Pain in right toe(s): Secondary | ICD-10-CM

## 2023-09-28 DIAGNOSIS — B351 Tinea unguium: Secondary | ICD-10-CM | POA: Diagnosis not present

## 2023-09-28 NOTE — Progress Notes (Signed)
This patient returns to my office for at risk foot care.  This patient requires this care by a professional since this patient will be at risk due to having diabetes.    This patient is unable to cut nails herself since the patient cannot reach her nails.These nails are painful walking and wearing shoes.  This patient presents for at risk foot care today.  General Appearance  Alert, conversant and in no acute stress.  Vascular  Dorsalis pedis and posterior tibial  pulses are palpable  bilaterally.  Capillary return is within normal limits  bilaterally. Temperature is within normal limits  bilaterally.  Neurologic  Senn-Weinstein monofilament wire test within normal limits  bilaterally. Muscle power within normal limits bilaterally.  Nails Thick disfigured discolored nails with subungual debris  from hallux to fifth toes bilaterally. No evidence of bacterial infection or drainage bilaterally.  Orthopedic  No limitations of motion  feet .  No crepitus or effusions noted.  No bony pathology or digital deformities noted.  Skin  normotropic skin with no porokeratosis noted bilaterally.  No signs of infections or ulcers noted.     Onychomycosis  Pain in right toes  Pain in left toes  Consent was obtained for treatment procedures.   Mechanical debridement of nails 1-5  bilaterally performed with a nail nipper.  Filed with dremel without incident.    Return office visit    3 months                  Told patient to return for periodic foot care and evaluation due to potential at risk complications.   Jayleigh Notarianni DPM   

## 2023-10-06 ENCOUNTER — Ambulatory Visit: Payer: Medicare HMO | Admitting: Dermatology

## 2023-10-06 ENCOUNTER — Encounter: Payer: Self-pay | Admitting: Dermatology

## 2023-10-06 DIAGNOSIS — Z86007 Personal history of in-situ neoplasm of skin: Secondary | ICD-10-CM

## 2023-10-06 DIAGNOSIS — Z48817 Encounter for surgical aftercare following surgery on the skin and subcutaneous tissue: Secondary | ICD-10-CM

## 2023-10-06 DIAGNOSIS — C4492 Squamous cell carcinoma of skin, unspecified: Secondary | ICD-10-CM

## 2023-10-06 NOTE — Patient Instructions (Signed)
 Important Information  Due to recent changes in healthcare laws, you may see results of your pathology and/or laboratory studies on MyChart before the doctors have had a chance to review them. We understand that in some cases there may be results that are confusing or concerning to you. Please understand that not all results are received at the same time and often the doctors may need to interpret multiple results in order to provide you with the best plan of care or course of treatment. Therefore, we ask that you please give [place] 2 business days to thoroughly review all your results before contacting the office for clarification. Should we see a critical lab result, you will be contacted sooner.   If You Need Anything After Your Visit  If you have any questions or concerns for your doctor, please call our main line at [phone] If no one answers, please leave a voicemail as directed and we will return your call as soon as possible. Messages left after 4 pm will be answered the following business day.   You may also send [place] a message via MyChart. We typically respond to MyChart messages within 1-2 business days.  For prescription refills, please ask your pharmacy to contact our office. Our fax number is [phone].  If you have an urgent issue when the clinic is closed that cannot wait until the next business day, you can page your doctor at the number below.    Please note that while we do our best to be available for urgent issues outside of office hours, we are not available 24/7.   If you have an urgent issue and are unable to reach [place], you may choose to seek medical care at your doctor's office, retail clinic, urgent care center, or emergency room.  If you have a medical emergency, please immediately call 911 or go to the emergency department. In the event of inclement weather, please call our main line at [phone] for an update on the status of any delays or  closures.  Dermatology Medication Tips: Please keep the boxes that topical medications come in in order to help keep track of the instructions about where and how to use these. Pharmacies typically print the medication instructions only on the boxes and not directly on the medication tubes.   If your medication is too expensive, please contact our office at [phone] or send [place] a message through MyChart.   We are unable to tell what your co-pay for medications will be in advance as this is different depending on your insurance coverage. However, we may be able to find a substitute medication at lower cost or fill out paperwork to get insurance to cover a needed medication.   If a prior authorization is required to get your medication covered by your insurance company, please allow [place] 1-2 business days to complete this process.  Drug prices often vary depending on where the prescription is filled and some pharmacies may offer cheaper prices.  The website www.goodrx.com contains coupons for medications through different pharmacies. The prices here do not account for what the cost may be with help from insurance (it may be cheaper with your insurance), but the website can give you the price if you did not use any insurance.  - You can print the associated coupon and take it with your prescription to the pharmacy.  - You may also stop by our office during regular business hours and pick up a GoodRx coupon card.  - If  you need your prescription sent electronically to a different pharmacy, notify our office through [facility] or by phone at [phone]    Wound Care Instructions for After Surgery  On the day following your surgery, you should begin doing daily dressing changes until your sutures are removed: Remove the bandage. Cleanse the wound gently with soap and water.  Make sure you then dry the skin surrounding the wound completely or the tape will not stick to the skin. Do not  use cotton balls on the wound. After the wound is clean and dry, apply the ointment (either prescription antibiotic prescribed by your doctor or plain Vaseline if nothing was prescribed) gently with a Q-tip. If you are using a bandaid to cover: Apply a bandaid large enough to cover the entire wound. If you do not have a bandaid large enough to cover the wound OR if you are sensitive to bandaid adhesive: Cut a non-stick pad (such as Telfa) to fit the size of the wound.  Cover the wound with the non-stick pad. If the wound is draining, you may want to add a small amount of gauze on top of the non-stick pad for a little added compression to the area. Use tape to seal the area completely.  For the next 1-2 weeks: Be sure to keep the wound moist with ointment 24/7 to ensure best healing. If you are unable to cover the wound with a bandage to hold the ointment in place, you may need to reapply the ointment several times a day. Do not bend over or lift heavy items to reduce the chance of elevated blood pressure to the wound. Do not participate in particularly strenuous activities.  Below is a list of dressing supplies you might need.  Cotton-tipped applicators - Q-tips Gauze pads (2x2 and/or 4x4) - All-Purpose Sponges New and clean tube of petroleum jelly (Vaseline) OR prescription antibiotic ointment if prescribed Either a bandaid large enough to cover the entire wound OR non-stick dressing material (Telfa) and Tape (Paper or Hypafix)  FOR ADULT SURGERY PATIENTS: If you need something for pain relief, you may take 1 extra strength Tylenol (acetaminophen) and 2 ibuprofen (200 mg) together every 4 hours as needed. (Do not take these medications if you are allergic to them or if you know you cannot take them for any other reason). Typically you may only need pain medication for 1-3 days.   Comments on the Post-Operative Period Slight swelling and redness often appear around the wound. This is normal  and will disappear within several days following the surgery. The healing wound will drain a brownish-red-yellow discharge during healing. This is a normal phase of wound healing. As the wound begins to heal, the drainage may increase in amount. Again, this drainage is normal. Notify us if the drainage becomes persistently bloody, excessively swollen, or intensely painful or develops a foul odor or red streaks.  The healing wound will also typically be itchy. This is normal. If you have severe or persistent pain, Notify us if the discomfort is severe or persistent. Avoid alcoholic beverages when taking pain medicine.  In Case of Wound Hemorrhage A wound hemorrhage is when the bandage suddenly becomes soaked with bright red blood and flows profusely. If this happens, sit down or lie down with your head elevated. If the wound has a dressing on it, do not remove the dressing. Apply pressure to the existing gauze. If the wound is not covered, use a gauze pad to apply pressure and  continue applying the pressure for 20 minutes without peeking. DO NOT COVER THE WOUND WITH A LARGE TOWEL OR WASH CLOTH. Release your hand from the wound site but do not remove the dressing. If the bleeding has stopped, gently clean around the wound. Leave the dressing in place for 24 hours if possible. This wait time allows the blood vessels to close off so that you do not spark a new round of bleeding by disrupting the newly clotted blood vessels with an immediate dressing change. If the bleeding does not subside, continue to hold pressure for 40 minutes. If bleeding continues, page your physician, contact an After Hours clinic or go to the Emergency Room.

## 2023-10-06 NOTE — Progress Notes (Signed)
  New Patient Visit   Subjective  Angela Rose is a 77 y.o. female who presents for the following: follow up from Mohs surgery   The patient presents for follow up from Mohs surgery for a scc on the left dorsal hand, treated on 09/21/23, repaired with FTSG repair. The patient has been bandaging the wound as directed. The endorse the following concerns: no questions or concerns at this time.    The following portions of the chart were reviewed this encounter and updated as appropriate: medications, allergies, medical history  Review of Systems:  No other skin or systemic complaints except as noted in HPI or Assessment and Plan.  Objective  Well appearing patient in no apparent distress; mood and affect are within normal limits.  A full examination was performed including scalp, head, face and left hand. All findings within normal limits unless otherwise noted below.  Healing wound with mild erythema  Relevant physical exam findings are noted in the Assessment and Plan.       Assessment & Plan   Healing s/p Mohs for [facility], treated on 09/21/23, repaired with FTSG repair. - Reassured that wound is healing well - No evidence of infection - No swelling, induration, purulence, dehiscence, or tenderness out of proportion to the clinical exam, see photo above - Discussed that scars take up to 12 months to mature from the date of surgery - Recommend SPF 30+ to scar daily to prevent purple color from UV exposure during scar maturation process - Discussed that erythema and raised appearance of scar will fade over the next 4-6 months - OK to start scar massage at 4-6 weeks post-op - Can consider silicone based products for scar healing starting at 6 weeks post-op - Ok to continue ointment daily to wound under a bandage for another 2 wks or until wound is completely healed.    Return in about 2 weeks (around 10/20/2023) for f/u wound check.  Dominga Ferry, Surg Tech III, am acting as  scribe for Gwenith Daily, MD.   Documentation: I have reviewed the above documentation for accuracy and completeness, and I agree with the above.  Gwenith Daily, MD

## 2023-10-15 ENCOUNTER — Other Ambulatory Visit: Payer: Self-pay | Admitting: Nurse Practitioner

## 2023-10-17 ENCOUNTER — Other Ambulatory Visit: Payer: Self-pay | Admitting: *Deleted

## 2023-10-17 DIAGNOSIS — Z7984 Long term (current) use of oral hypoglycemic drugs: Secondary | ICD-10-CM

## 2023-10-17 DIAGNOSIS — E118 Type 2 diabetes mellitus with unspecified complications: Secondary | ICD-10-CM

## 2023-10-17 DIAGNOSIS — Z794 Long term (current) use of insulin: Secondary | ICD-10-CM

## 2023-10-17 DIAGNOSIS — N1832 Chronic kidney disease, stage 3b: Secondary | ICD-10-CM

## 2023-10-17 MED ORDER — ACCU-CHEK GUIDE ME W/DEVICE KIT
PACK | 0 refills | Status: AC
Start: 2023-10-17 — End: ?

## 2023-10-17 MED ORDER — ACCU-CHEK GUIDE TEST VI STRP
ORAL_STRIP | 11 refills | Status: AC
Start: 2023-10-17 — End: ?

## 2023-10-17 MED ORDER — TRESIBA FLEXTOUCH 100 UNIT/ML ~~LOC~~ SOPN
16.0000 [IU] | PEN_INJECTOR | Freq: Every day | SUBCUTANEOUS | 3 refills | Status: DC
Start: 2023-10-17 — End: 2024-01-04

## 2023-10-18 ENCOUNTER — Other Ambulatory Visit: Payer: Self-pay | Admitting: Internal Medicine

## 2023-10-18 DIAGNOSIS — E119 Type 2 diabetes mellitus without complications: Secondary | ICD-10-CM | POA: Diagnosis not present

## 2023-10-18 DIAGNOSIS — E1169 Type 2 diabetes mellitus with other specified complication: Secondary | ICD-10-CM

## 2023-10-20 VITALS — BP 105/47 | HR 58

## 2023-10-20 DIAGNOSIS — Z006 Encounter for examination for normal comparison and control in clinical research program: Secondary | ICD-10-CM

## 2023-10-20 MED ORDER — STUDY - LIBREXIA-ACS - MILVEXIAN 25 MG OR PLACEBO TABLET (PI-STUCKEY): 1.0000 | ORAL_TABLET | Freq: Two times a day (BID) | ORAL | 0 refills | Status: DC

## 2023-10-20 NOTE — Research (Cosign Needed Addendum)
 LIBREXIA ACS 39 WEEK VISIT    Medication Kit Accountability Dispensed Status [x] Dispensed [] Dispensed In Error [] Not Dispensed If 'Not Dispensed', provide reason: [] Medical Decision [] Medication Kit not available or damaged [] Study medication discontinued [] Visit skipped or administration skipped [] Medication kit dispensed in error Date Dispensed: 09/Jan/2025 Amount Dispensed: [id number] (Milvexian) 25mg  or Placebo -   [phone],  [phone],  [phone],  [phone]  Return Status: [] Damaged/Returned by subject [] Missing/Not returned by subject [x] Returned by subject If not returned reasons: [] Forgot [] Lost Date Returned: 09/Jan/2025 Amount Returned:  [phone] tablets [phone] tablets  [phone] 61 tablets [phone] 70 tablets   Compliance%: 89  Were any suspected endpoint events or adverse events experienced? [] Yes [x] No   Current Outpatient Medications:    aspirin  81 MG tablet, Take by mouth every evening. , Disp: , Rfl:    azelastine  (ASTELIN ) 0.1 % nasal spray, Place 1 spray into both nostrils 2 (two) times daily. Use in each nostril as directed, Disp: 30 mL, Rfl: 1   Blood Glucose Monitoring Suppl (ACCU-CHEK GUIDE ME) w/Device KIT, Use to check blood sugars twice a day , before breakfast and before bed., Disp: 1 kit, Rfl: 0   clopidogrel  (PLAVIX ) 75 MG tablet, Take 1 tablet (75 mg total) by mouth daily., Disp: 90 tablet, Rfl: 3   dapagliflozin  propanediol (FARXIGA ) 10 MG TABS tablet, Take 1 tablet (10 mg total) by mouth daily., Disp: 30 tablet, Rfl: 11   FLUoxetine  (PROZAC ) 40 MG capsule, Take 1 capsule (40 mg total) by mouth daily., Disp: 90 capsule, Rfl: 3   furosemide  (LASIX ) 40 MG tablet, Take 1 tablet (40 mg total) by mouth as needed for edema. 3 lb weight gain overnight., Disp: 30 tablet, Rfl: 5   Glucose Blood (ACCU-CHEK GUIDE TEST VI), by In Vitro route., Disp: , Rfl:    glucose blood (ACCU-CHEK GUIDE TEST) test strip, Use to check blood sugar before breakfast  and before bed., Disp: 100 each, Rfl: 11   Glucose Blood (BLOOD GLUCOSE TEST STRIPS) STRP, Use to check glucose twice daily, Disp: 100 strip, Rfl: 11   glucose blood test strip, Frequency:PHARMDIR   Dosage:0.0     Instructions:  Note:testing 3-4xqd, DX Code 250.00 Dose: 1, Disp: , Rfl:    insulin  degludec (TRESIBA  FLEXTOUCH) 100 UNIT/ML FlexTouch Pen, Inject 16 Units into the skin at bedtime. DX Code E11.8 , Z79.4, Disp: 15 mL, Rfl: 3   Insulin  Pen Needle (BD PEN NEEDLE NANO U/F) 32G X 4 MM MISC, Use to check glucose once daily, Disp: 100 each, Rfl: 6   levothyroxine  (SYNTHROID ) 50 MCG tablet, Take 1 tablet (50 mcg total) by mouth daily before breakfast. (Patient taking differently: Take 50 mcg by mouth at bedtime.), Disp: 90 tablet, Rfl: 3   metFORMIN  (GLUCOPHAGE -XR) 500 MG 24 hr tablet, Take 1 tablet (500 mg total) by mouth daily with breakfast. Resume on 01/18/23, Disp: 90 tablet, Rfl: 3   metoprolol  succinate (TOPROL -XL) 25 MG 24 hr tablet, Take 1 tablet (25 mg total) by mouth daily., Disp: 90 tablet, Rfl: 3   mupirocin  ointment (BACTROBAN ) 2 %, Apply 1 Application topically daily. To wound under a bandage, Disp: 22 g, Rfl: 0   nitroGLYCERIN  (NITROSTAT ) 0.4 MG SL tablet, Place 1 tablet (0.4 mg total) under the tongue every 5 (five) minutes as needed for chest pain., Disp: 25 tablet, Rfl: 3   REPATHA  SURECLICK 140 MG/ML SOAJ, ADMINISTER 1 ML UNDER THE SKIN 1 TIME EVERY 14 DAYS, Disp: 2 mL, Rfl: 12   sacubitril -valsartan  (  ENTRESTO ) 24-26 MG, Take 1 tablet by mouth 2 (two) times daily., Disp: 180 tablet, Rfl: 3   sodium chloride  (OCEAN) 0.65 % SOLN nasal spray, Place 1 spray into both nostrils as needed for congestion., Disp: 30 mL, Rfl: 0   spironolactone  (ALDACTONE ) 25 MG tablet, Take 0.5 tablets (12.5 mg total) by mouth daily., Disp: 45 tablet, Rfl: 3   Study - LIBREXIA-ACS - milvexian 25 mg or placebo tablet (PI-Stuckey), Take 1 tablet by mouth 2 (two) times daily. Take 1 tablet by mouth twice a day  with or without food. Bring bottle back to every visit. Please contact [facility] for any questions or concerns regarding the medication, Disp: 280 tablet, Rfl: 0   b complex vitamins capsule, Take 1 capsule by mouth daily. (Patient not taking: Reported on 10/20/2023), Disp: , Rfl:    cetirizine  (ZYRTEC ) 5 MG tablet, Take 1 tablet (5 mg total) by mouth daily. (Patient not taking: Reported on 10/20/2023), Disp: 30 tablet, Rfl: 0   ezetimibe  (ZETIA ) 10 MG tablet, Take 1 tablet (10 mg total) by mouth daily. (Patient not taking: Reported on 10/20/2023), Disp: 90 tablet, Rfl: 3   Vitamin D , Ergocalciferol , (DRISDOL ) 1.25 MG (50000 UNIT) CAPS capsule, Take 1 capsule (50,000 Units total) by mouth every 7 (seven) days. (Patient not taking: Reported on 10/20/2023), Disp: 10 capsule, Rfl: 1

## 2023-10-24 ENCOUNTER — Encounter: Payer: Self-pay | Admitting: Dermatology

## 2023-10-24 ENCOUNTER — Ambulatory Visit (INDEPENDENT_AMBULATORY_CARE_PROVIDER_SITE_OTHER): Payer: 59 | Admitting: Dermatology

## 2023-10-24 DIAGNOSIS — Z48817 Encounter for surgical aftercare following surgery on the skin and subcutaneous tissue: Secondary | ICD-10-CM

## 2023-10-24 DIAGNOSIS — Z85828 Personal history of other malignant neoplasm of skin: Secondary | ICD-10-CM

## 2023-10-24 DIAGNOSIS — C4492 Squamous cell carcinoma of skin, unspecified: Secondary | ICD-10-CM

## 2023-10-24 DIAGNOSIS — L905 Scar conditions and fibrosis of skin: Secondary | ICD-10-CM

## 2023-10-24 NOTE — Patient Instructions (Signed)

## 2023-10-24 NOTE — Progress Notes (Signed)
   Follow-Up Visit   Subjective  Deazia Lampi Lobban is a 78 y.o. female who presents for the following: Mohs of left dorsal hand Pt here to follow up on Mohs of left dorsal hand treated on 09/21/23 w FTSG. Hand doing well  The following portions of the chart were reviewed this encounter and updated as appropriate: medications, allergies, medical history  Review of Systems:  No other skin or systemic complaints except as noted in HPI or Assessment and Plan.  Objective  Well appearing patient in no apparent distress; mood and affect are within normal limits.    A focused examination was performed of the following areas: Left dorsal hand  Relevant exam findings are noted in the Assessment and Plan.    Assessment & Plan   Scar s/p Mohs for [facility], treated on 09/21/23, repaired with FTSG - Reassured that wound has healed well - Discussed that scars take up to 12 months to mature from the date of surgery - Recommend SPF 30+ to scar daily to prevent purple color - OK to start scar massage at 4-6 weeks post-op - Can consider silicone based products for scar healing  Return if symptoms worsen or fail to improve.  I, Darice Smock, CMA, am acting as scribe for RUFUS CHRISTELLA HOLY, MD.   Documentation: I have reviewed the above documentation for accuracy and completeness, and I agree with the above.  RUFUS CHRISTELLA HOLY, MD

## 2023-10-27 ENCOUNTER — Encounter: Payer: Self-pay | Admitting: Internal Medicine

## 2023-10-27 ENCOUNTER — Ambulatory Visit (INDEPENDENT_AMBULATORY_CARE_PROVIDER_SITE_OTHER): Payer: 59 | Admitting: Internal Medicine

## 2023-10-27 VITALS — BP 129/68 | HR 62 | Ht 66.0 in | Wt 165.6 lb

## 2023-10-27 DIAGNOSIS — C44629 Squamous cell carcinoma of skin of left upper limb, including shoulder: Secondary | ICD-10-CM | POA: Insufficient documentation

## 2023-10-27 DIAGNOSIS — E782 Mixed hyperlipidemia: Secondary | ICD-10-CM | POA: Diagnosis not present

## 2023-10-27 DIAGNOSIS — N1832 Chronic kidney disease, stage 3b: Secondary | ICD-10-CM | POA: Diagnosis not present

## 2023-10-27 DIAGNOSIS — E875 Hyperkalemia: Secondary | ICD-10-CM | POA: Diagnosis not present

## 2023-10-27 NOTE — Assessment & Plan Note (Signed)
History of statin intolerance.  Repatha started in April 2024.  Repeat lipid panel ordered today.

## 2023-10-27 NOTE — Assessment & Plan Note (Addendum)
Labs are consistent with CKD stage 3B.  Currently on ARNi and SGLT2.  Patient's mother died of complications related to ESRD.  Through shared decision making, a referral to nephrology was placed today.  Repeat BMP ordered.

## 2023-10-27 NOTE — Progress Notes (Signed)
Established Patient Office Visit  Subjective   Patient ID: Angela Rose, female    DOB: 1946-03-19  Age: 78 y.o. MRN: [id number]  Chief Complaint  Patient presents with   Care Management    Follow up    Angela Rose she was last evaluated by me in September 2024.  Keflex was prescribed for treatment of UTI.  No additional medication changes were made and 51-month follow-up was arranged.  In the interim, she has been seen by podiatry, cardiology, dermatology, and endocrinology for follow-up.  Seen at [facility] for an acute visit in late November for UTI.  She underwent Mohs surgery for removal of SCC on the left dorsal hand.  There have otherwise been no acute interval events.  Angela Rose reports feeling well today.  She is asymptomatic and has no acute concerns to discuss.  Past Medical History:  Diagnosis Date   Anxiety 07/10/2014   Basal cell carcinoma    Cancer (HCC) 12/13/2011   melanoma face   CHF (congestive heart failure), NYHA class II, acute on chronic, combined (HCC) 01/14/2023   Chronic kidney disease (CKD), stage III (moderate) (HCC) 02/17/2022   Chronic sinusitis 07/02/2022   Depression 07/10/2014   Diabetes mellitus without complication (HCC) 08/21/2014   Fracture of tibial plateau 12/31/2015   Hyperlipidemia 08/14/2015   Hypertension 09/03/2014   Hypothyroidism, postablative 02/08/2014   Melanoma (HCC)    Myocardial infarction (HCC) 1993   Tick bites 10/29/2014   Vitamin D deficiency 12/08/2022   Past Surgical History:  Procedure Laterality Date   ABDOMINAL HYSTERECTOMY     APPENDECTOMY     BASAL CELL CARCINOMA EXCISION     BREAST BIOPSY Right    needle bx-neg   CATARACT EXTRACTION, BILATERAL Bilateral 11/23/2018   CORONARY ARTERY BYPASS GRAFT  1996   x 3   CORONARY/GRAFT ACUTE MI REVASCULARIZATION N/A 01/13/2023   Procedure: Coronary/Graft Acute MI Revascularization;  Surgeon: Marykay Lex, MD;  Location: [facility] INVASIVE CV LAB;  Service:  Cardiovascular;  Laterality: N/A;   LEFT HEART CATH AND CORONARY ANGIOGRAPHY N/A 01/13/2023   Procedure: LEFT HEART CATH AND CORONARY ANGIOGRAPHY;  Surgeon: Marykay Lex, MD;  Location: [facility] INVASIVE CV LAB;  Service: Cardiovascular;  Laterality: N/A;   MELANOMA EXCISION     PARTIAL HYSTERECTOMY  1976   ovaries remain   Social History   Tobacco Use   Smoking status: Former    Current packs/day: 0.00    Types: Cigarettes    Quit date: 06/11/1982    Years since quitting: 41.4   Smokeless tobacco: Never   Tobacco comments:    smoking cessation materials not required  Vaping Use   Vaping status: Never Used  Substance Use Topics   Alcohol use: Yes    Alcohol/week: 0.0 standard drinks of alcohol    Comment: rare 1 wine glass of wine   Drug use: No   Family History  Problem Relation Age of Onset   Skin cancer Mother    Squamous cell carcinoma Mother    Basal cell carcinoma Mother    Heart disease Father    CAD Father    Breast cancer Neg Hx    Allergies  Allergen Reactions   Propoxyphene Nausea And Vomiting    Other reaction(s): Vomiting   Lidocaine-Epinephrine (Pf)     Other reaction(s): Other (See Comments) palpitatins and chest pain   Other Other (See Comments)    sutures - in her hand-very irritated   Tape Rash  BANDAIDS. BANDAIDS   Xylocaine  [Lidocaine Hcl]     Other reaction(s): Other (See Comments) palpitatins and chest pain   Statins     Muscle pain and weakness in legs   Jardiance [Empagliflozin] Other (See Comments)    Recurrent vaginal infections   Review of Systems  Constitutional:  Negative for chills and fever.  HENT:  Negative for sore throat.   Respiratory:  Negative for cough and shortness of breath.   Cardiovascular:  Negative for chest pain, palpitations and leg swelling.  Gastrointestinal:  Negative for abdominal pain, blood in stool, constipation, diarrhea, nausea and vomiting.  Genitourinary:  Negative for dysuria and hematuria.   Musculoskeletal:  Negative for myalgias.  Skin:  Negative for itching and rash.  Neurological:  Negative for dizziness and headaches.  Psychiatric/Behavioral:  Negative for depression and suicidal ideas.      Objective:     BP 129/68 (BP Location: Right Arm, Patient Position: Sitting, Cuff Size: Normal)   Pulse 62   Ht 5\' 6"  (1.676 m)   Wt 165 lb 9.6 oz (75.1 kg)   SpO2 97%   BMI 26.73 kg/m  BP Readings from Last 3 Encounters:  10/27/23 129/68  10/20/23 (!) 105/47  09/21/23 (!) 97/43   Physical Exam Vitals reviewed.  Constitutional:      General: She is not in acute distress.    Appearance: Normal appearance. She is not toxic-appearing.  HENT:     Head: Normocephalic and atraumatic.     Right Ear: External ear normal.     Left Ear: External ear normal.     Nose: Nose normal. No congestion or rhinorrhea.     Mouth/Throat:     Mouth: Mucous membranes are moist.     Pharynx: Oropharynx is clear. No oropharyngeal exudate or posterior oropharyngeal erythema.  Eyes:     General: No scleral icterus.    Extraocular Movements: Extraocular movements intact.     Conjunctiva/sclera: Conjunctivae normal.     Pupils: Pupils are equal, round, and reactive to light.  Cardiovascular:     Rate and Rhythm: Normal rate and regular rhythm.     Pulses: Normal pulses.     Heart sounds: Normal heart sounds. No murmur heard.    No friction rub. No gallop.  Pulmonary:     Effort: Pulmonary effort is normal.     Breath sounds: Normal breath sounds. No wheezing, rhonchi or rales.  Abdominal:     General: Abdomen is flat. Bowel sounds are normal. There is no distension.     Palpations: Abdomen is soft.     Tenderness: There is no abdominal tenderness.  Musculoskeletal:        General: No swelling. Normal range of motion.     Cervical back: Normal range of motion.     Right lower leg: No edema.     Left lower leg: No edema.  Lymphadenopathy:     Cervical: No cervical adenopathy.   Skin:    General: Skin is warm and dry.     Capillary Refill: Capillary refill takes less than 2 seconds.     Coloration: Skin is not jaundiced.  Neurological:     General: No focal deficit present.     Mental Status: She is alert and oriented to person, place, and time.  Psychiatric:        Mood and Affect: Mood normal.        Behavior: Behavior normal.   Last CBC Lab Results  Component Value  Date   WBC 7.9 01/15/2023   HGB 10.8 (L) 01/15/2023   HCT 33.0 (L) 01/15/2023   MCV 88.7 01/15/2023   MCH 29.0 01/15/2023   RDW 13.6 01/15/2023   PLT 242 01/15/2023   Last metabolic panel Lab Results  Component Value Date   GLUCOSE 141 (H) 09/02/2023   NA 139 09/02/2023   K 5.7 (H) 09/02/2023   CL 103 09/02/2023   CO2 22 09/02/2023   BUN 30 (H) 09/02/2023   CREATININE 1.54 (H) 09/02/2023   EGFR 34 (L) 09/02/2023   CALCIUM 9.0 09/02/2023   PROT 7.3 09/02/2023   ALBUMIN 4.2 09/02/2023   LABGLOB 3.1 09/02/2023   AGRATIO 1.6 12/27/2022   BILITOT 0.4 09/02/2023   ALKPHOS 133 (H) 09/02/2023   AST 29 09/02/2023   ALT 19 09/02/2023   ANIONGAP 6 01/16/2023   Last lipids Lab Results  Component Value Date   CHOL 242 (H) 01/12/2023   HDL 57 01/12/2023   LDLCALC 133 (H) 01/12/2023   TRIG 261 (H) 01/12/2023   CHOLHDL 4.2 01/12/2023   Last hemoglobin A1c Lab Results  Component Value Date   HGBA1C 8.4 (A) 09/05/2023   Last thyroid functions Lab Results  Component Value Date   TSH 2.660 09/02/2023   T4TOTAL 8.2 02/16/2016   Last vitamin D Lab Results  Component Value Date   VD25OH 21.6 (L) 12/06/2022     Assessment & Plan:   Problem List Items Addressed This Visit       SCC (squamous cell carcinoma), hand, left   S/p Mohs procedure for treatment of SCC on dorsal left hand.  She is closely followed by dermatology (Dr. Caralyn Guile) and has an appointment scheduled for follow-up next week (1/21).      Chronic kidney disease, stage 3b (HCC)   Labs are consistent with CKD  stage 3B.  Currently on ARNi and SGLT2.  Patient's mother died of complications related to ESRD.  Through shared decision making, a referral to nephrology was placed today.  Repeat BMP ordered.      Hyperlipidemia   History of statin intolerance.  Repatha started in April 2024.  Repeat lipid panel ordered today.      Hyperkalemia   Noted on previous labs from November.  Repeat BMP ordered today      Return in about 3 months (around 01/25/2024).   Billie Lade, MD

## 2023-10-27 NOTE — Assessment & Plan Note (Signed)
Noted on previous labs from November.  Repeat BMP ordered today

## 2023-10-27 NOTE — Assessment & Plan Note (Signed)
S/p Mohs procedure for treatment of SCC on dorsal left hand.  She is closely followed by dermatology (Dr. Caralyn Guile) and has an appointment scheduled for follow-up next week (1/21).

## 2023-10-27 NOTE — Patient Instructions (Signed)
It was a pleasure to see you today.  Thank you for giving [place] the opportunity to be involved in your care.  Below is a brief recap of your visit and next steps.  We will plan to see you again in 3 months.  Summary No medication changes today Nephrology referral placed today Repeat chemistry panel and cholesterol panel ordered today Follow up in 3 months

## 2023-10-28 ENCOUNTER — Encounter: Payer: Self-pay | Admitting: Internal Medicine

## 2023-10-28 LAB — LIPID PANEL
Chol/HDL Ratio: 4.6 {ratio} — ABNORMAL HIGH (ref 0.0–4.4)
Cholesterol, Total: 205 mg/dL — ABNORMAL HIGH (ref 100–199)
HDL: 45 mg/dL (ref >39)
LDL Chol Calc (NIH): 132 mg/dL — ABNORMAL HIGH (ref 0–99)
Triglycerides: 158 mg/dL — ABNORMAL HIGH (ref 0–149)
VLDL Cholesterol Cal: 28 mg/dL (ref 5–40)

## 2023-10-28 LAB — BASIC METABOLIC PANEL
BUN/Creatinine Ratio: 18 (ref 12–28)
BUN: 24 mg/dL (ref 8–27)
CO2: 21 mmol/L (ref 20–29)
Calcium: 8.9 mg/dL (ref 8.7–10.3)
Chloride: 104 mmol/L (ref 96–106)
Creatinine, Ser: 1.34 mg/dL — ABNORMAL HIGH (ref 0.57–1.00)
Glucose: 76 mg/dL (ref 70–99)
Potassium: 4.6 mmol/L (ref 3.5–5.2)
Sodium: 143 mmol/L (ref 134–144)
eGFR: 40 mL/min/{1.73_m2} — ABNORMAL LOW (ref >59)

## 2023-11-01 ENCOUNTER — Ambulatory Visit (INDEPENDENT_AMBULATORY_CARE_PROVIDER_SITE_OTHER): Payer: 59 | Admitting: Dermatology

## 2023-11-01 ENCOUNTER — Encounter: Payer: Self-pay | Admitting: Dermatology

## 2023-11-01 VITALS — BP 116/66 | HR 72

## 2023-11-01 DIAGNOSIS — L578 Other skin changes due to chronic exposure to nonionizing radiation: Secondary | ICD-10-CM | POA: Diagnosis not present

## 2023-11-01 DIAGNOSIS — W908XXA Exposure to other nonionizing radiation, initial encounter: Secondary | ICD-10-CM | POA: Diagnosis not present

## 2023-11-01 DIAGNOSIS — L905 Scar conditions and fibrosis of skin: Secondary | ICD-10-CM

## 2023-11-01 DIAGNOSIS — L821 Other seborrheic keratosis: Secondary | ICD-10-CM | POA: Diagnosis not present

## 2023-11-01 DIAGNOSIS — D229 Melanocytic nevi, unspecified: Secondary | ICD-10-CM

## 2023-11-01 DIAGNOSIS — Z85828 Personal history of other malignant neoplasm of skin: Secondary | ICD-10-CM

## 2023-11-01 DIAGNOSIS — L814 Other melanin hyperpigmentation: Secondary | ICD-10-CM | POA: Diagnosis not present

## 2023-11-01 DIAGNOSIS — Z1283 Encounter for screening for malignant neoplasm of skin: Secondary | ICD-10-CM

## 2023-11-01 DIAGNOSIS — D1801 Hemangioma of skin and subcutaneous tissue: Secondary | ICD-10-CM | POA: Diagnosis not present

## 2023-11-01 NOTE — Progress Notes (Signed)
   Follow-Up Visit   Subjective  Angela Rose is a 78 y.o. female who presents for the following: Skin Cancer Screening and Full Body Skin Exam  The patient presents for Total-Body Skin Exam (TBSE) for skin cancer screening and mole check. The patient has spots, moles and lesions to be evaluated, some may be new or changing. Pt has hx of SCC, on her left hand, treated with Mohs by me, repaired with FTSG   The following portions of the chart were reviewed this encounter and updated as appropriate: medications, allergies, medical history  Review of Systems:  No other skin or systemic complaints except as noted in HPI or Assessment and Plan.  Objective  Well appearing patient in no apparent distress; mood and affect are within normal limits.  A full examination was performed including scalp, head, eyes, ears, nose, lips, neck, chest, axillae, abdomen, back, buttocks, bilateral upper extremities, bilateral lower extremities, hands, feet, fingers, toes, fingernails, and toenails. All findings within normal limits unless otherwise noted below.   Relevant physical exam findings are noted in the Assessment and Plan.    Assessment & Plan   SKIN CANCER SCREENING PERFORMED TODAY.  ACTINIC DAMAGE - Chronic condition, secondary to cumulative UV/sun exposure - diffuse scaly erythematous macules with underlying dyspigmentation - Recommend daily broad spectrum sunscreen SPF 30+ to sun-exposed areas, reapply every 2 hours as needed.  - Staying in the shade or wearing long sleeves, sun glasses (UVA+UVB protection) and wide brim hats (4-inch brim around the entire circumference of the hat) are also recommended for sun protection.  - Call for new or changing lesions.  MELANOCYTIC NEVI - Tan-brown and/or pink-flesh-colored symmetric macules and papules - Benign appearing on exam today - Observation - Call clinic for new or changing moles - Recommend daily use of broad spectrum spf 30+  sunscreen to sun-exposed areas.   SEBORRHEIC KERATOSIS - Stuck-on, waxy, tan-brown papules and/or plaques  - Benign-appearing - Discussed benign etiology and prognosis. - Observe - Call for any changes  LENTIGINES Exam: scattered tan macules Due to sun exposure Treatment Plan: Benign-appearing, observe. Recommend daily broad spectrum sunscreen SPF 30+ to sun-exposed areas, reapply every 2 hours as needed.  Call for any changes    HEMANGIOMA Exam: red papule(s) Discussed benign nature. Recommend observation. Call for changes.   Scar s/p Mohs for [facility], treated on 09/21/23, repaired with FTSG - Reassured that wound has healed well - Discussed that scars take up to 12 months to mature from the date of surgery - Recommend SPF 30+ to scar daily to prevent purple color - OK to start scar massage at 4-6 weeks post-op - Can consider silicone based products for scar healing  Return in about 6 months (around 04/30/2024) for TBSE.  I, Tillie Fantasia, CMA, am acting as scribe for Gwenith Daily, MD.   Documentation: I have reviewed the above documentation for accuracy and completeness, and I agree with the above.  Gwenith Daily, MD

## 2023-11-01 NOTE — Patient Instructions (Signed)

## 2023-11-07 DIAGNOSIS — E113292 Type 2 diabetes mellitus with mild nonproliferative diabetic retinopathy without macular edema, left eye: Secondary | ICD-10-CM | POA: Diagnosis not present

## 2023-11-07 LAB — HM DIABETES EYE EXAM

## 2023-11-23 DIAGNOSIS — I130 Hypertensive heart and chronic kidney disease with heart failure and stage 1 through stage 4 chronic kidney disease, or unspecified chronic kidney disease: Secondary | ICD-10-CM | POA: Diagnosis not present

## 2023-11-23 DIAGNOSIS — E1129 Type 2 diabetes mellitus with other diabetic kidney complication: Secondary | ICD-10-CM | POA: Diagnosis not present

## 2023-11-23 DIAGNOSIS — E1122 Type 2 diabetes mellitus with diabetic chronic kidney disease: Secondary | ICD-10-CM | POA: Diagnosis not present

## 2023-11-23 DIAGNOSIS — R809 Proteinuria, unspecified: Secondary | ICD-10-CM | POA: Diagnosis not present

## 2023-11-27 DIAGNOSIS — E119 Type 2 diabetes mellitus without complications: Secondary | ICD-10-CM | POA: Diagnosis not present

## 2023-12-05 ENCOUNTER — Other Ambulatory Visit (HOSPITAL_COMMUNITY): Payer: Self-pay | Admitting: Nephrology

## 2023-12-05 DIAGNOSIS — R809 Proteinuria, unspecified: Secondary | ICD-10-CM

## 2023-12-05 DIAGNOSIS — N281 Cyst of kidney, acquired: Secondary | ICD-10-CM

## 2023-12-05 DIAGNOSIS — I129 Hypertensive chronic kidney disease with stage 1 through stage 4 chronic kidney disease, or unspecified chronic kidney disease: Secondary | ICD-10-CM

## 2023-12-05 DIAGNOSIS — N1832 Chronic kidney disease, stage 3b: Secondary | ICD-10-CM

## 2023-12-14 ENCOUNTER — Other Ambulatory Visit: Payer: Self-pay | Admitting: Internal Medicine

## 2023-12-14 DIAGNOSIS — E1169 Type 2 diabetes mellitus with other specified complication: Secondary | ICD-10-CM

## 2023-12-14 MED ORDER — REPATHA SURECLICK 140 MG/ML ~~LOC~~ SOAJ
140.0000 mg | SUBCUTANEOUS | 12 refills | Status: AC
Start: 2023-12-14 — End: ?

## 2023-12-14 NOTE — Telephone Encounter (Signed)
 Last Fill: 10/18/23  Last OV: 10/27/23 Next OV: 01/18/24 AWV  Routing to provider for review/authorization.

## 2023-12-14 NOTE — Telephone Encounter (Signed)
 Copied from CRM [id number]. Topic: Clinical - Medication Refill >> Dec 14, 2023  3:53 PM Higinio Roger wrote: Most Recent Primary Care Visit:  Provider: Christel Mormon E  Department: [facility] CARE  Visit Type: ACUTE  Date: 10/27/2023  Medication: REPATHA SURECLICK 140 MG/ML SOAJ  Has the patient contacted their pharmacy? Yes (Agent: If no, request that the patient contact the pharmacy for the refill. If patient does not wish to contact the pharmacy document the reason why and proceed with request.) (Agent: If yes, when and what did the pharmacy advise?) Needs Prior Authorization for [facility] for Dr Durwin Nora.  Is this the correct pharmacy for this prescription? Yes If no, delete pharmacy and type the correct one.  This is the patient's preferred pharmacy:   [facility] [phone] - [place], [place] - [address] AT [facility] [address]  [place] [address] Phone: [phone] Fax: [phone]   Has the prescription been filled recently? Yes  Is the patient out of the medication? Yes  Has the patient been seen for an appointment in the last year OR does the patient have an upcoming appointment? Yes  Can we respond through MyChart? Yes.  Agent: Please be advised that Rx refills may take up to 3 business days. We ask that you follow-up with your pharmacy.

## 2023-12-16 ENCOUNTER — Telehealth: Payer: Self-pay | Admitting: Internal Medicine

## 2023-12-16 NOTE — Telephone Encounter (Signed)
 Copied from CRM [id number]. Topic: General - Other >> Dec 16, 2023  4:10 PM Clide Dales wrote: Tamera Punt with [facility] called to get an update on the prior authorization for patients REPATHA SURECLICK 140 MG/ML SOAJ. Please advise. prescription# [phone]

## 2023-12-19 ENCOUNTER — Other Ambulatory Visit (HOSPITAL_COMMUNITY): Payer: Self-pay

## 2023-12-19 ENCOUNTER — Telehealth: Payer: Self-pay | Admitting: Pharmacy Technician

## 2023-12-19 NOTE — Telephone Encounter (Signed)
 Pharmacy Patient Advocate Encounter  Received notification from [facility] that Prior Authorization for REPATHA SURECLICK 140MG /ML has been APPROVED from 12/19/2023 to 06/20/2024. Ran test claim, Copay is $0.00. This test claim was processed through [facility]- copay amounts may vary at other pharmacies due to pharmacy/plan contracts, or as the patient moves through the different stages of their insurance plan.   PA #/Case ID/Reference #: [id number]

## 2023-12-19 NOTE — Telephone Encounter (Signed)
 PA request has been Started. New Encounter has been or will be created for follow up. For additional info see Pharmacy Prior Auth telephone encounter from 12/19/2023.

## 2023-12-19 NOTE — Telephone Encounter (Signed)
 Pharmacy Patient Advocate Encounter   Received notification from Pt Calls Messages that prior authorization for REPATHA SURECLICK 140MG /ML  is required/requested.   Insurance verification completed.   The patient is insured through [facility] .   Per test claim: PA required; PA submitted to above mentioned insurance via CoverMyMeds Key/confirmation #/EOC [id number] Status is pending

## 2023-12-26 ENCOUNTER — Other Ambulatory Visit: Payer: Self-pay

## 2023-12-26 DIAGNOSIS — R300 Dysuria: Secondary | ICD-10-CM

## 2023-12-27 ENCOUNTER — Telehealth: Payer: Self-pay

## 2023-12-27 DIAGNOSIS — R300 Dysuria: Secondary | ICD-10-CM | POA: Diagnosis not present

## 2023-12-27 DIAGNOSIS — E119 Type 2 diabetes mellitus without complications: Secondary | ICD-10-CM | POA: Diagnosis not present

## 2023-12-27 NOTE — Telephone Encounter (Signed)
 PA request has been Approved. New Encounter has been or will be created for follow up. For additional info see Pharmacy Prior Auth telephone encounter from 12-19-2023.

## 2023-12-27 NOTE — Telephone Encounter (Signed)
 Copied from CRM [id number]. Topic: Clinical - Prescription Issue >> Dec 12, 2023  3:35 PM Alessandra Bevels wrote: Reason for CRM: Rosezetta Schlatter calling [facility] calling to check on the the status of the PA on REPATHA SURECLICK 140 MG/ML SOAJ [[id number]] [phone]

## 2023-12-28 ENCOUNTER — Other Ambulatory Visit: Payer: Self-pay | Admitting: Internal Medicine

## 2023-12-28 DIAGNOSIS — N3001 Acute cystitis with hematuria: Secondary | ICD-10-CM

## 2023-12-28 MED ORDER — CIPROFLOXACIN HCL 250 MG PO TABS: 250.0000 mg | ORAL_TABLET | Freq: Two times a day (BID) | ORAL | 0 refills | Status: AC

## 2024-01-01 LAB — MICROSCOPIC EXAMINATION
Casts: NONE SEEN /LPF
Epithelial Cells (non renal): NONE SEEN /HPF (ref 0–10)
RBC, Urine: NONE SEEN /HPF (ref 0–2)
WBC, UA: >30 /HPF — AB (ref 0–5)

## 2024-01-01 LAB — UA/M W/RFLX CULTURE, ROUTINE
Bilirubin, UA: NEGATIVE
Ketones, UA: NEGATIVE
Nitrite, UA: POSITIVE — AB
Specific Gravity, UA: 1.018 (ref 1.005–1.030)
Urobilinogen, Ur: 0.2 mg/dL (ref 0.2–1.0)
pH, UA: 5.5 (ref 5.0–7.5)

## 2024-01-01 LAB — URINE CULTURE, REFLEX

## 2024-01-03 ENCOUNTER — Encounter: Payer: Self-pay | Admitting: Podiatry

## 2024-01-03 ENCOUNTER — Ambulatory Visit (INDEPENDENT_AMBULATORY_CARE_PROVIDER_SITE_OTHER): Payer: 59 | Admitting: Podiatry

## 2024-01-03 DIAGNOSIS — E118 Type 2 diabetes mellitus with unspecified complications: Secondary | ICD-10-CM

## 2024-01-03 DIAGNOSIS — M79674 Pain in right toe(s): Secondary | ICD-10-CM

## 2024-01-03 DIAGNOSIS — B351 Tinea unguium: Secondary | ICD-10-CM

## 2024-01-03 DIAGNOSIS — M79675 Pain in left toe(s): Secondary | ICD-10-CM | POA: Diagnosis not present

## 2024-01-03 NOTE — Progress Notes (Addendum)
This patient returns to my office for at risk foot care.  This patient requires this care by a professional since this patient will be at risk due to having diabetes.  This patient is unable to cut nails herself since the patient cannot reach her nails.These nails are painful walking and wearing shoes.  This patient presents for at risk foot care today.  General Appearance  Alert, conversant and in no acute stress.  Vascular  Dorsalis pedis and posterior tibial  pulses are palpable  bilaterally.  Capillary return is within normal limits  bilaterally. Temperature is within normal limits  bilaterally.  Neurologic  Senn-Weinstein monofilament wire test within normal limits  bilaterally. Muscle power within normal limits bilaterally.  Nails Thick disfigured discolored nails with subungual debris  from hallux to fifth toes bilaterally. No evidence of bacterial infection or drainage bilaterally.  Orthopedic  No limitations of motion  feet .  No crepitus or effusions noted.  No bony pathology or digital deformities noted.  Skin  normotropic skin with no porokeratosis noted bilaterally.  No signs of infections or ulcers noted.     Onychomycosis  Pain in right toes  Pain in left toes  Consent was obtained for treatment procedures.   Mechanical debridement of nails 1-5  bilaterally performed with a nail nipper.  Filed with dremel without incident.    Return office visit   4 months                   Told patient to return for periodic foot care and evaluation due to potential at risk complications.   Karmah Potocki DPM  

## 2024-01-04 ENCOUNTER — Encounter: Payer: Self-pay | Admitting: Nurse Practitioner

## 2024-01-04 ENCOUNTER — Ambulatory Visit (INDEPENDENT_AMBULATORY_CARE_PROVIDER_SITE_OTHER): Payer: Medicare HMO | Admitting: Nurse Practitioner

## 2024-01-04 VITALS — BP 92/64 | HR 68 | Ht 66.0 in | Wt 167.6 lb

## 2024-01-04 DIAGNOSIS — N1832 Chronic kidney disease, stage 3b: Secondary | ICD-10-CM

## 2024-01-04 DIAGNOSIS — E118 Type 2 diabetes mellitus with unspecified complications: Secondary | ICD-10-CM | POA: Diagnosis not present

## 2024-01-04 DIAGNOSIS — Z7984 Long term (current) use of oral hypoglycemic drugs: Secondary | ICD-10-CM | POA: Diagnosis not present

## 2024-01-04 DIAGNOSIS — I10 Essential (primary) hypertension: Secondary | ICD-10-CM | POA: Diagnosis not present

## 2024-01-04 DIAGNOSIS — E890 Postprocedural hypothyroidism: Secondary | ICD-10-CM

## 2024-01-04 DIAGNOSIS — E1122 Type 2 diabetes mellitus with diabetic chronic kidney disease: Secondary | ICD-10-CM

## 2024-01-04 DIAGNOSIS — Z794 Long term (current) use of insulin: Secondary | ICD-10-CM

## 2024-01-04 LAB — POCT GLYCOSYLATED HEMOGLOBIN (HGB A1C): Hemoglobin A1C: 7.5 % — AB (ref 4.0–5.6)

## 2024-01-04 MED ORDER — TRESIBA FLEXTOUCH 100 UNIT/ML ~~LOC~~ SOPN: 12.0000 [IU] | PEN_INJECTOR | Freq: Every day | SUBCUTANEOUS | 3 refills | Status: DC

## 2024-01-04 MED ORDER — METFORMIN HCL ER 500 MG PO TB24: 500.0000 mg | ORAL_TABLET | Freq: Every day | ORAL | 3 refills | Status: DC

## 2024-01-04 NOTE — Progress Notes (Signed)
 Endocrinology Follow Up Note       01/04/2024, 11:35 AM   Subjective:    Patient ID: Angela Rose, female    DOB: June 22, 1946.  Angela Rose is being seen in follow up after being seen in consultation for management of currently uncontrolled symptomatic diabetes requested by  Billie Lade, MD.   Past Medical History:  Diagnosis Date   Anxiety 07/10/2014   Basal cell carcinoma    Cancer (HCC) 12/13/2011   melanoma face   CHF (congestive heart failure), NYHA class II, acute on chronic, combined (HCC) 01/14/2023   Chronic kidney disease (CKD), stage III (moderate) (HCC) 02/17/2022   Chronic sinusitis 07/02/2022   Depression 07/10/2014   Diabetes mellitus without complication (HCC) 08/21/2014   Fracture of tibial plateau 12/31/2015   Hyperlipidemia 08/14/2015   Hypertension 09/03/2014   Hypothyroidism, postablative 02/08/2014   Melanoma (HCC)    Myocardial infarction (HCC) 1993   Tick bites 10/29/2014   Vitamin D deficiency 12/08/2022    Past Surgical History:  Procedure Laterality Date   ABDOMINAL HYSTERECTOMY     APPENDECTOMY     BASAL CELL CARCINOMA EXCISION     BREAST BIOPSY Right    needle bx-neg   CATARACT EXTRACTION, BILATERAL Bilateral 11/23/2018   CORONARY ARTERY BYPASS GRAFT  1996   x 3   CORONARY/GRAFT ACUTE MI REVASCULARIZATION N/A 01/13/2023   Procedure: Coronary/Graft Acute MI Revascularization;  Surgeon: Marykay Lex, MD;  Location: [facility] INVASIVE CV LAB;  Service: Cardiovascular;  Laterality: N/A;   LEFT HEART CATH AND CORONARY ANGIOGRAPHY N/A 01/13/2023   Procedure: LEFT HEART CATH AND CORONARY ANGIOGRAPHY;  Surgeon: Marykay Lex, MD;  Location: [facility] INVASIVE CV LAB;  Service: Cardiovascular;  Laterality: N/A;   MELANOMA EXCISION     PARTIAL HYSTERECTOMY  1976   ovaries remain    Social History   Socioeconomic History   Marital status: Significant Other     Spouse name: Not on file   Number of children: 3   Years of education: Not on file   Highest education level: Some college, no degree  Occupational History   Occupation: retired  Tobacco Use   Smoking status: Former    Current packs/day: 0.00    Types: Cigarettes    Quit date: 06/11/1982    Years since quitting: 41.5   Smokeless tobacco: Never   Tobacco comments:    smoking cessation materials not required  Vaping Use   Vaping status: Never Used  Substance and Sexual Activity   Alcohol use: Yes    Alcohol/week: 0.0 standard drinks of alcohol    Comment: rare 1 wine glass of wine   Drug use: No   Sexual activity: Not on file  Other Topics Concern   Not on file  Social History Narrative   Not on file   Social Drivers of Health   Financial Resource Strain: Low Risk  (01/14/2023)   Overall Financial Resource Strain (CARDIA)    Difficulty of Paying Living Expenses: Not very hard  Food Insecurity: No Food Insecurity (01/13/2023)   Hunger Vital Sign    Worried About Running Out of Food in the Last Year: Never true  Ran Out of Food in the Last Year: Never true  Transportation Needs: No Transportation Needs (01/13/2023)   PRAPARE - Administrator, Civil Service (Medical): No    Lack of Transportation (Non-Medical): No  Physical Activity: Inactive (12/14/2021)   Exercise Vital Sign    Days of Exercise per Week: 0 days    Minutes of Exercise per Session: 0 min  Stress: Stress Concern Present (12/14/2021)   [employer] of Occupational Health - Occupational Stress Questionnaire    Feeling of Stress : To some extent  Social Connections: Moderately Isolated (12/14/2021)   Social Connection and Isolation Panel [NHANES]    Frequency of Communication with Friends and Family: More than three times a week    Frequency of Social Gatherings with Friends and Family: Once a week    Attends Religious Services: Never    Database administrator or Organizations: No    Attends Museum/gallery exhibitions officer: Never    Marital Status: Living with partner    Family History  Problem Relation Age of Onset   Skin cancer Mother    Squamous cell carcinoma Mother    Basal cell carcinoma Mother    Heart disease Father    CAD Father    Breast cancer Neg Hx     Outpatient Encounter Medications as of 01/04/2024  Medication Sig   aspirin 81 MG tablet Take by mouth every evening.    azelastine (ASTELIN) 0.1 % nasal spray Place 1 spray into both nostrils 2 (two) times daily. Use in each nostril as directed   Blood Glucose Monitoring Suppl (ACCU-CHEK GUIDE ME) w/Device KIT Use to check blood sugars twice a day , before breakfast and before bed.   clopidogrel (PLAVIX) 75 MG tablet Take 1 tablet (75 mg total) by mouth daily.   FLUoxetine (PROZAC) 40 MG capsule Take 1 capsule (40 mg total) by mouth daily.   furosemide (LASIX) 40 MG tablet Take 1 tablet (40 mg total) by mouth as needed for edema. 3 lb weight gain overnight.   Glucose Blood (ACCU-CHEK GUIDE TEST VI) by In Vitro route.   glucose blood (ACCU-CHEK GUIDE TEST) test strip Use to check blood sugar before breakfast and before bed.   Glucose Blood (BLOOD GLUCOSE TEST STRIPS) STRP Use to check glucose twice daily   glucose blood test strip Frequency:PHARMDIR   Dosage:0.0     Instructions:  Note:testing 3-4xqd, DX Code 250.00 Dose: 1   Insulin Pen Needle (BD PEN NEEDLE NANO U/F) 32G X 4 MM MISC Use to check glucose once daily   levothyroxine (SYNTHROID) 50 MCG tablet Take 1 tablet (50 mcg total) by mouth daily before breakfast. (Patient taking differently: Take 50 mcg by mouth at bedtime.)   metoprolol succinate (TOPROL-XL) 25 MG 24 hr tablet Take 1 tablet (25 mg total) by mouth daily.   mupirocin ointment (BACTROBAN) 2 % Apply 1 Application topically daily. To wound under a bandage   nitroGLYCERIN (NITROSTAT) 0.4 MG SL tablet Place 1 tablet (0.4 mg total) under the tongue every 5 (five) minutes as needed for chest pain.   REPATHA  SURECLICK 140 MG/ML SOAJ Inject 140 mg into the skin every 14 (fourteen) days.   sacubitril-valsartan (ENTRESTO) 24-26 MG Take 1 tablet by mouth 2 (two) times daily.   sodium chloride (OCEAN) 0.65 % SOLN nasal spray Place 1 spray into both nostrils as needed for congestion.   spironolactone (ALDACTONE) 25 MG tablet Take 0.5 tablets (12.5 mg total) by mouth  daily.   Study - LIBREXIA-ACS - milvexian 25 mg or placebo tablet (PI-Stuckey) Take 1 tablet by mouth 2 (two) times daily. Take 1 tablet by mouth twice a day with or without food. Bring bottle back to every visit. Please contact [place] [facility] for any questions or concerns regarding the medication   Vitamin D, Ergocalciferol, (DRISDOL) 1.25 MG (50000 UNIT) CAPS capsule Take 1 capsule (50,000 Units total) by mouth every 7 (seven) days.   [DISCONTINUED] dapagliflozin propanediol (FARXIGA) 10 MG TABS tablet Take 1 tablet (10 mg total) by mouth daily.   [DISCONTINUED] insulin degludec (TRESIBA FLEXTOUCH) 100 UNIT/ML FlexTouch Pen Inject 16 Units into the skin at bedtime. DX Code E11.8 , Z79.4   [DISCONTINUED] metFORMIN (GLUCOPHAGE-XR) 500 MG 24 hr tablet Take 1 tablet (500 mg total) by mouth daily with breakfast. Resume on 01/18/23   b complex vitamins capsule Take 1 capsule by mouth daily. (Patient not taking: Reported on 10/20/2023)   cetirizine (ZYRTEC) 5 MG tablet Take 1 tablet (5 mg total) by mouth daily. (Patient not taking: Reported on 10/20/2023)   ezetimibe (ZETIA) 10 MG tablet Take 1 tablet (10 mg total) by mouth daily. (Patient not taking: Reported on 10/20/2023)   insulin degludec (TRESIBA FLEXTOUCH) 100 UNIT/ML FlexTouch Pen Inject 12 Units into the skin at bedtime. DX Code E11.8 , Z79.4   metFORMIN (GLUCOPHAGE-XR) 500 MG 24 hr tablet Take 1 tablet (500 mg total) by mouth daily with breakfast. Resume on 01/18/23   No facility-administered encounter medications on file as of 01/04/2024.    ALLERGIES: Allergies  Allergen Reactions    Propoxyphene Nausea And Vomiting    Other reaction(s): Vomiting   Lidocaine-Epinephrine (Pf)     Other reaction(s): Other (See Comments) palpitatins and chest pain   Other Other (See Comments)    sutures - in her hand-very irritated   Tape Rash    BANDAIDS. BANDAIDS   Xylocaine  [Lidocaine Hcl]     Other reaction(s): Other (See Comments) palpitatins and chest pain   Statins     Muscle pain and weakness in legs   Jardiance [Empagliflozin] Other (See Comments)    Recurrent vaginal infections    VACCINATION STATUS: Immunization History  Administered Date(s) Administered   Fluad Quad(high Dose 65+) 07/25/2019, 08/20/2020, 09/30/2021, 09/07/2022   Fluad Trivalent(High Dose 65+) 06/14/2023   Influenza, High Dose Seasonal PF 06/20/2017, 06/23/2018   Influenza,inj,Quad PF,6+ Mos 08/14/2015, 09/27/2016   PFIZER(Purple Top)SARS-COV-2 Vaccination 12/01/2019, 12/23/2019, 07/20/2020, 04/24/2021   Pneumococcal Conjugate-13 02/16/2016   Pneumococcal Polysaccharide-23 11/15/2008, 10/14/2014    Diabetes She presents for her follow-up diabetic visit. She has type 2 diabetes mellitus. Onset time: Diagnosed at approx age of 79. Her disease course has been improving. Hypoglycemia symptoms include nervousness/anxiousness, sweats and tremors. Pertinent negatives for diabetes include no fatigue, no polyuria and no weight loss. Hypoglycemia complications include nocturnal hypoglycemia. Symptoms are stable. Diabetic complications include heart disease and nephropathy. Risk factors for coronary artery disease include diabetes mellitus, dyslipidemia, family history, post-menopausal and sedentary lifestyle. Current diabetic treatment includes insulin injections and oral agent (dual therapy). She is compliant with treatment most of the time. Her weight is stable. She is following a generally healthy diet. When asked about meal planning, she reported none. She has not had a previous visit with a dietitian. She  rarely participates in exercise. Her home blood glucose trend is decreasing steadily. Her breakfast blood glucose range is generally <70 mg/dl. Her overall blood glucose range is 90-110 mg/dl. (She presents today with  her meter showing tight fasting glycemic profile.  Her POCT A1c today is 7.5%, improving from last visit of 8.4%.  She notes she was particularly stressed recently, recently lost a long-time friend.  She also notes she has had UTIs back to back. and admits to eating more sweets than she knows she should.  Analysis of her meter shows 7-day average of 105 (8 readings); 14-day average of 97 (15 readings); 30-day average of 100 (30 readings), 90-day average of 116 (78 readings).) An ACE inhibitor/angiotensin II receptor blocker is being taken. She sees a podiatrist.Eye exam is current.    Review of systems  Constitutional: + Minimally fluctuating body weight,  current Body mass index is 27.05 kg/m. , no fatigue, no subjective hyperthermia, no subjective hypothermia Eyes: no blurry vision, no xerophthalmia ENT: no sore throat, no nodules palpated in throat, no dysphagia/odynophagia, no hoarseness Cardiovascular: no chest pain, no shortness of breath, no palpitations, no leg swelling Respiratory: no cough, no shortness of breath Gastrointestinal: no nausea/vomiting/diarrhea Musculoskeletal: no muscle/joint aches Genitourinary: + polyuria, frequent UTIs Skin: no rashes, no hyperemia Neurological: no tremors, no numbness, no tingling, no dizziness Psychiatric: no depression, no anxiety  Objective:     BP 92/64 (BP Location: Right Arm, Patient Position: Sitting, Cuff Size: Large)   Pulse 68   Ht 5\' 6"  (1.676 m)   Wt 167 lb 9.6 oz (76 kg)   BMI 27.05 kg/m   Wt Readings from Last 3 Encounters:  01/04/24 167 lb 9.6 oz (76 kg)  10/27/23 165 lb 9.6 oz (75.1 kg)  09/05/23 164 lb 1.9 oz (74.4 kg)     BP Readings from Last 3 Encounters:  01/04/24 92/64  11/01/23 116/66  10/27/23  129/68       Physical Exam- Limited  Constitutional:  Body mass index is 27.05 kg/m. , not in acute distress, normal state of mind Eyes:  EOMI, no exophthalmos Musculoskeletal: no gross deformities, strength intact in all four extremities, no gross restriction of joint movements Skin:  no rashes, no hyperemia Neurological: no tremor with outstretched hands    CMP ( most recent) CMP     Component Value Date/Time   NA 143 10/27/2023 0932   K 4.6 10/27/2023 0932   CL 104 10/27/2023 0932   CO2 21 10/27/2023 0932   GLUCOSE 76 10/27/2023 0932   GLUCOSE 166 (H) 01/16/2023 0136   BUN 24 10/27/2023 0932   CREATININE 1.34 (H) 10/27/2023 0932   CALCIUM 8.9 10/27/2023 0932   PROT 7.3 09/02/2023 1206   ALBUMIN 4.2 09/02/2023 1206   AST 29 09/02/2023 1206   ALT 19 09/02/2023 1206   ALKPHOS 133 (H) 09/02/2023 1206   BILITOT 0.4 09/02/2023 1206   GFRNONAA 40 (L) 01/16/2023 0136   GFRAA 49 (L) 05/02/2020 1017     Diabetic Labs (most recent): Lab Results  Component Value Date   HGBA1C 7.5 (A) 01/04/2024   HGBA1C 8.4 (A) 09/05/2023   HGBA1C 7.8 (A) 05/02/2023     Lipid Panel ( most recent) Lipid Panel     Component Value Date/Time   CHOL 205 (H) 10/27/2023 0932   TRIG 158 (H) 10/27/2023 0932   HDL 45 10/27/2023 0932   CHOLHDL 4.6 (H) 10/27/2023 0932   CHOLHDL 4.2 01/12/2023 2339   VLDL 52 (H) 01/12/2023 2339   LDLCALC 132 (H) 10/27/2023 0932   LABVLDL 28 10/27/2023 0932      Lab Results  Component Value Date   TSH 2.660 09/02/2023   TSH  2.160 12/27/2022   TSH 2.300 12/06/2022   TSH 1.540 08/03/2022   TSH 3.170 02/10/2022   TSH 1.990 09/30/2021   TSH 6.140 (H) 04/27/2021   TSH 1.850 12/25/2019   TSH 4.690 (H) 12/25/2018   TSH 4.060 05/20/2017   FREET4 1.28 09/02/2023   FREET4 1.39 12/27/2022   FREET4 1.25 12/06/2022   FREET4 1.35 08/03/2022   FREET4 1.35 09/30/2021   FREET4 1.06 12/25/2019           Assessment & Plan:   1) Type 2 diabetes mellitus  with stage 3b chronic kidney disease, with long-term current use of insulin (HCC)  She presents today with her meter showing tight fasting glycemic profile.  Her POCT A1c today is 7.5%, improving from last visit of 8.4%.  She notes she was particularly stressed recently, recently lost a long-time friend.  She also notes she has had UTIs back to back. and admits to eating more sweets than she knows she should.  Analysis of her meter shows 7-day average of 105 (8 readings); 14-day average of 97 (15 readings); 30-day average of 100 (30 readings), 90-day average of 116 (78 readings).  - Angela Rose has currently uncontrolled symptomatic type 2 DM since 77 years of age.   -Recent labs reviewed.  - I had a long discussion with her about the progressive nature of diabetes and the pathology behind its complications. -her diabetes is complicated by CAD with MI and CKD stage 3 and she remains at a high risk for more acute and chronic complications which include CAD, CVA, CKD, retinopathy, and neuropathy. These are all discussed in detail with her.  The following Lifestyle Medicine recommendations according to American College of Lifestyle Medicine [facility]) were discussed and offered to patient and she agrees to start the journey:  A. Whole Foods, Plant-based plate comprising of fruits and vegetables, plant-based proteins, whole-grain carbohydrates was discussed in detail with the patient.   A list for source of those nutrients were also provided to the patient.  Patient will use only water or unsweetened tea for hydration. B.  The need to stay away from risky substances including alcohol, smoking; obtaining 7 to 9 hours of restorative sleep, at least 150 minutes of moderate intensity exercise weekly, the importance of healthy social connections,  and stress reduction techniques were discussed. C.  A full color page of  Calorie density of various food groups per pound showing examples of each food groups  was provided to the patient.  - Nutritional counseling repeated at each appointment due to patients tendency to fall back in to old habits.  - The patient admits there is a room for improvement in their diet and drink choices. -  Suggestion is made for the patient to avoid simple carbohydrates from their diet including Cakes, Sweet Desserts / Pastries, Ice Cream, Soda (diet and regular), Sweet Tea, Candies, Chips, Cookies, Sweet Pastries, Store Bought Juices, Alcohol in Excess of 1-2 drinks a day, Artificial Sweeteners, Coffee Creamer, and "Sugar-free" Products. This will help patient to have stable blood glucose profile and potentially avoid unintended weight gain.   - I encouraged the patient to switch to unprocessed or minimally processed complex starch and increased protein intake (animal or plant source), fruits, and vegetables.   - Patient is advised to stick to a routine mealtimes to eat 3 meals a day and avoid unnecessary snacks (to snack only to correct hypoglycemia).  - I have approached her with the following individualized plan to manage  her diabetes and patient agrees:   - She is advised to lower her Tresiba to 12 units SQ nightly and continue her Metformin 500 mg ER daily with breakfast.  I am stopping her Marcelline Deist today given her frequent UTIs since starting this medication.  Will cc them on this message as they may want to try something else in its place.  -she is encouraged to continue monitoring blood glucose twice daily, before breakfast and before bed, and to call the clinic if she has readings less than 70 or above 300 for 3 tests in a row.   - she is warned not to take insulin without proper monitoring per orders. - Adjustment parameters are given to her for hypo and hyperglycemia in writing.  - she is not an ideal candidate for incretin therapy due to body habitus and BMI of 25.  - Specific targets for  A1c; LDL, HDL, and Triglycerides were discussed with the  patient.  2) Blood Pressure /Hypertension:  her blood pressure is controlled to target.   she is advised to continue her current medications as prescribed by cardiology.  3) Lipids/Hyperlipidemia:    Review of her recent lipid panel from 01/12/23 showed uncontrolled LDL at 133 and elevated triglycerides of 261 .  she is advised to continue Repatha 140 mg every 14 days.  Side effects and precautions discussed with her.  4)  Weight/Diet:  her Body mass index is 27.05 kg/m.  -  she is NOT a candidate for weight loss.  Exercise, and detailed carbohydrates information provided  -  detailed on discharge instructions.  5) Hypothyroidism- RAI induced She was diagnosed with hyperthyroidism over 15 years ago which subsequently led to her RAI ablation and now hypothyroidism.    There are no recent TFTs to review.  She is advised to continue Levothyroxine 50 mcg po daily before breakfast.  Will recheck TFTs prior to next visit and adjust dose accordingly.   - The correct intake of thyroid hormone (Levothyroxine, Synthroid), is on empty stomach first thing in the morning, with water, separated by at least 30 minutes from breakfast and other medications,  and separated by more than 4 hours from calcium, iron, multivitamins, acid reflux medications (PPIs).  - This medication is a life-long medication and will be needed to correct thyroid hormone imbalances for the rest of your life.  The dose may change from time to time, based on thyroid blood work.  - It is extremely important to be consistent taking this medication, near the same time each morning.  -AVOID TAKING PRODUCTS CONTAINING BIOTIN (commonly found in Hair, Skin, Nails vitamins) AS IT INTERFERES WITH THE VALIDITY OF THYROID FUNCTION BLOOD TESTS.  6) Chronic Care/Health Maintenance: -she is on ACEI/ARB and not on Statin medications and is encouraged to initiate and continue to follow up with Ophthalmology, Dentist, Podiatrist at least yearly or  according to recommendations, and advised to stay away from smoking. I have recommended yearly flu vaccine and pneumonia vaccine at least every 5 years; moderate intensity exercise for up to 150 minutes weekly; and sleep for at least 7 hours a day.  - she is advised to maintain close follow up with Durwin Nora Lucina Mellow, MD for primary care needs, as well as her other providers for optimal and coordinated care.      I spent  41  minutes in the care of the patient today including review of labs from CMP, Lipids, Thyroid Function, Hematology (current and previous including abstractions from other  facilities); face-to-face time discussing  her blood glucose readings/logs, discussing hypoglycemia and hyperglycemia episodes and symptoms, medications doses, her options of short and long term treatment based on the latest standards of care / guidelines;  discussion about incorporating lifestyle medicine;  and documenting the encounter. Risk reduction counseling performed per USPSTF guidelines to reduce obesity and cardiovascular risk factors.     Please refer to Patient Instructions for Blood Glucose Monitoring and Insulin/Medications Dosing Guide"  in media tab for additional information. Please  also refer to " Patient Self Inventory" in the Media  tab for reviewed elements of pertinent patient history.   Jon Howley participated in the discussions, expressed understanding, and voiced agreement with the above plans.  All questions were answered to her satisfaction. she is encouraged to contact clinic should she have any questions or concerns prior to her return visit.     Follow up plan: - Return in about 4 months (around 05/05/2024) for Diabetes F/U with A1c in office, Thyroid follow up, Previsit labs, Bring meter and logs.   Ronny Bacon, [facility] [address] [place], [place] [address] Phone: [phone] Fax: [phone]  01/04/2024, 11:35  AM

## 2024-01-04 NOTE — Patient Instructions (Signed)

## 2024-01-09 VITALS — BP 113/52 | HR 61 | Temp 97.8°F | Resp 18

## 2024-01-09 DIAGNOSIS — Z006 Encounter for examination for normal comparison and control in clinical research program: Secondary | ICD-10-CM

## 2024-01-09 MED ORDER — STUDY - LIBREXIA-ACS - MILVEXIAN 25 MG OR PLACEBO TABLET (PI-STUCKEY): 1.0000 | ORAL_TABLET | Freq: Two times a day (BID) | ORAL | 0 refills | Status: DC

## 2024-01-09 NOTE — Research (Cosign Needed Addendum)
 LIBREXIA ACS 52 WEEK VISIT   EQ-5D-5L Assessment Completed? [x] Yes [] No Reason not done: [] Subject Forgot [] Subject too ill [] Subject refused [] Technical failure [] Other If Other, Specify Collection Date: 31/Mar/2025   [facility] Hematology/Chemistry drawn: [x] Yes [] No   Medication Kit Accountability Dispensed Status [x] Dispensed [] Dispensed In Error [] Not Dispensed If 'Not Dispensed', provide reason: [] Medical Decision [] Medication Kit not available or damaged [] Study medication discontinued [] Visit skipped or administration skipped [] Medication kit dispensed in error Date Dispensed: 31/Mar/2025 Amount Dispensed:  [id number] (Milvexian) 25mg  or Placebo [phone]  [id number] (Milvexian) 25mg  or Placebo [phone]  [id number] (Milvexian) 25mg  or Placebo [phone]  [id number] (Milvexian) 25mg  or Placebo [phone]  [id number] (Milvexian) 25mg  or Placebo [phone]  [id number] (Milvexian) 25mg  or Placebo [phone]   Return Status: [] Damaged/Returned by subject [] Missing/Not returned by subject [x] Returned by subject If not returned reasons: [] Forgot [] Lost Date Returned: 31/Mar/2025 Amount Returned:  [phone] tablets [phone] tablets [phone] tablets  [phone] tablets    Compliance%: 99   Were any suspected endpoint events or adverse events experienced? [] Yes [x] No   Subject re consented with Librexia ICF version 6.0 and copy given to subject.     Current Outpatient Medications:    aspirin  81 MG tablet, Take by mouth every evening. , Disp: , Rfl:    azelastine  (ASTELIN ) 0.1 % nasal spray, Place 1 spray into both nostrils 2 (two) times daily. Use in each nostril as directed, Disp: 30 mL, Rfl: 1   Blood Glucose Monitoring Suppl (ACCU-CHEK GUIDE ME) w/Device KIT, Use to check blood sugars twice a day , before breakfast and before bed., Disp: 1 kit, Rfl: 0   clopidogrel  (PLAVIX ) 75 MG tablet, Take 1 tablet (75 mg total) by mouth daily., Disp: 90 tablet,  Rfl: 3   FLUoxetine  (PROZAC ) 40 MG capsule, Take 1 capsule (40 mg total) by mouth daily., Disp: 90 capsule, Rfl: 3   furosemide  (LASIX ) 40 MG tablet, Take 1 tablet (40 mg total) by mouth as needed for edema. 3 lb weight gain overnight., Disp: 30 tablet, Rfl: 5   Glucose Blood (ACCU-CHEK GUIDE TEST VI), by In Vitro route., Disp: , Rfl:    glucose blood (ACCU-CHEK GUIDE TEST) test strip, Use to check blood sugar before breakfast and before bed., Disp: 100 each, Rfl: 11   Glucose Blood (BLOOD GLUCOSE TEST STRIPS) STRP, Use to check glucose twice daily, Disp: 100 strip, Rfl: 11   glucose blood test strip, Frequency:PHARMDIR   Dosage:0.0     Instructions:  Note:testing 3-4xqd, DX Code 250.00 Dose: 1, Disp: , Rfl:    insulin  degludec (TRESIBA  FLEXTOUCH) 100 UNIT/ML FlexTouch Pen, Inject 12 Units into the skin at bedtime. DX Code E11.8 , Z79.4, Disp: 12 mL, Rfl: 3   Insulin  Pen Needle (BD PEN NEEDLE NANO U/F) 32G X 4 MM MISC, Use to check glucose once daily, Disp: 100 each, Rfl: 6   metFORMIN  (GLUCOPHAGE -XR) 500 MG 24 hr tablet, Take 1 tablet (500 mg total) by mouth daily with breakfast. Resume on 01/18/23, Disp: 90 tablet, Rfl: 3   metoprolol  succinate (TOPROL -XL) 25 MG 24 hr tablet, Take 1 tablet (25 mg total) by mouth daily., Disp: 90 tablet, Rfl: 3   nitroGLYCERIN  (NITROSTAT ) 0.4 MG SL tablet, Place 1 tablet (0.4 mg total) under the tongue every 5 (five) minutes as needed for chest pain., Disp: 25 tablet, Rfl: 3   REPATHA  SURECLICK 140 MG/ML SOAJ, Inject 140 mg into the skin every 14 (fourteen) days., Disp: 2 mL, Rfl: 12  sacubitril -valsartan  (ENTRESTO ) 24-26 MG, Take 1 tablet by mouth 2 (two) times daily., Disp: 180 tablet, Rfl: 3   spironolactone  (ALDACTONE ) 25 MG tablet, Take 0.5 tablets (12.5 mg total) by mouth daily., Disp: 45 tablet, Rfl: 3   Study - LIBREXIA-ACS - milvexian 25 mg or placebo tablet (PI-Stuckey), Take 1 tablet by mouth 2 (two) times daily. Take 1 tablet by mouth twice a day with or  without food. Bring bottle back to every visit. Please contact [facility] for any questions or concerns regarding the medication. For Investigational Use Only., Disp: 420 tablet, Rfl: 0   b complex vitamins capsule, Take 1 capsule by mouth daily. (Patient not taking: Reported on 10/20/2023), Disp: , Rfl:    cetirizine  (ZYRTEC ) 5 MG tablet, Take 1 tablet (5 mg total) by mouth daily. (Patient not taking: Reported on 10/20/2023), Disp: 30 tablet, Rfl: 0   ezetimibe  (ZETIA ) 10 MG tablet, Take 1 tablet (10 mg total) by mouth daily. (Patient not taking: Reported on 10/20/2023), Disp: 90 tablet, Rfl: 3   levothyroxine  (SYNTHROID ) 50 MCG tablet, Take 1 tablet (50 mcg total) by mouth daily before breakfast. (Patient taking differently: Take 50 mcg by mouth at bedtime.), Disp: 90 tablet, Rfl: 3   mupirocin  ointment (BACTROBAN ) 2 %, Apply 1 Application topically daily. To wound under a bandage (Patient not taking: Reported on 01/09/2024), Disp: 22 g, Rfl: 0   sodium chloride  (OCEAN) 0.65 % SOLN nasal spray, Place 1 spray into both nostrils as needed for congestion. (Patient not taking: Reported on 01/09/2024), Disp: 30 mL, Rfl: 0   Vitamin D , Ergocalciferol , (DRISDOL ) 1.25 MG (50000 UNIT) CAPS capsule, Take 1 capsule (50,000 Units total) by mouth every 7 (seven) days. (Patient not taking: Reported on 01/09/2024), Disp: 10 capsule, Rfl: 1

## 2024-01-10 LAB — LAB REPORT - SCANNED: EGFR: 44

## 2024-01-12 NOTE — Research (Addendum)
 Are there any labs that are clinically significant?  Yes []  OR No[x]   Is the patient eligible to continue enrollment in the study after screening visit?  Yes [x]   OR No[]    Labs previously reviewed.  Currently stable.  Patient may remain in the trial.  Stable hemoglobin.    Maisie Scotland, MD

## 2024-01-17 ENCOUNTER — Ambulatory Visit
Admission: EM | Admit: 2024-01-17 | Discharge: 2024-01-17 | Disposition: A | Discharge status: Home / Self Care | Attending: Nurse Practitioner | Admitting: Nurse Practitioner

## 2024-01-17 DIAGNOSIS — R062 Wheezing: Secondary | ICD-10-CM | POA: Diagnosis not present

## 2024-01-17 DIAGNOSIS — J069 Acute upper respiratory infection, unspecified: Secondary | ICD-10-CM | POA: Diagnosis not present

## 2024-01-17 MED ORDER — BENZONATATE 100 MG PO CAPS: 100.0000 mg | ORAL_CAPSULE | Freq: Three times a day (TID) | ORAL | 0 refills | Status: DC | PRN

## 2024-01-17 MED ORDER — ALBUTEROL SULFATE HFA 108 (90 BASE) MCG/ACT IN AERS: 1.0000 | INHALATION_SPRAY | Freq: Four times a day (QID) | RESPIRATORY_TRACT | 0 refills | Status: DC | PRN

## 2024-01-17 MED ORDER — ALBUTEROL SULFATE (2.5 MG/3ML) 0.083% IN NEBU
2.5000 mg | INHALATION_SOLUTION | Freq: Once | RESPIRATORY_TRACT | Status: AC
  Administered 2024-01-17: 2.5 mg via RESPIRATORY_TRACT

## 2024-01-17 NOTE — Discharge Instructions (Addendum)
 As we discussed, your symptoms are likely caused by a viral infection.  We gave you a breathing treatment today which helped open up your airway.  Continue to use the rescue inhaler every 4-6 hours as needed for wheezing or shortness of breath.  Also recommend starting guaifenesin 600 mg twice daily to help the mucus thin out.  You can take the cough suppressant medicine every 8 hours as needed for dry cough.  Seek care if symptoms do not improve or if they worsen despite treatment.

## 2024-01-17 NOTE — ED Provider Notes (Signed)
 [facility]    CSN: [id number] Arrival date & time: 01/17/24  1534      History   Chief Complaint No chief complaint on file.   HPI Angela Rose is a 78 y.o. female.   Patient presents today with 5-day history of congested cough, shortness of breath, chest congestion, runny and stuffy nose, headache, bilateral ears feel stuffed up, decreased appetite, and fatigue.  She denies fever, body aches or chills, sore throat, abdominal pain, nausea/vomiting, and diarrhea.  Has not taken anything for symptoms so far.    Past Medical History:  Diagnosis Date   Anxiety 07/10/2014   Basal cell carcinoma    Cancer (HCC) 12/13/2011   melanoma face   CHF (congestive heart failure), NYHA class II, acute on chronic, combined (HCC) February 05, 2023   Chronic kidney disease (CKD), stage III (moderate) (HCC) 02/17/2022   Chronic sinusitis 07/02/2022   Depression 07/10/2014   Diabetes mellitus without complication (HCC) 08/21/2014   Fracture of tibial plateau 12/31/2015   Hyperlipidemia 08/14/2015   Hypertension 09/03/2014   Hypothyroidism, postablative 02/08/2014   Melanoma (HCC)    Myocardial infarction (HCC) 1993   Tick bites 10/29/2014   Vitamin D deficiency 12/08/2022    Patient Active Problem List   Diagnosis Date Noted   Hyperkalemia 10/27/2023   SCC (squamous cell carcinoma), hand, left 10/27/2023   Cystitis 06/14/2023   Need for influenza vaccination 06/14/2023   Vitamin D insufficiency 03/30/2023   CHF (congestive heart failure), NYHA class II, acute on chronic, combined (HCC) 2023/02/05   Ischemic cardiomyopathy 02/05/23   At risk for sudden cardiac death February 05, 2023   Acute ST elevation myocardial infarction (STEMI) of inferior wall (HCC) 01/13/2023   Hx of CABG 01/13/2023   Coronary artery disease involving native coronary artery of native heart with unstable angina pectoris (HCC) 01/13/2023   Coronary artery disease involving coronary bypass graft of  native heart with angina pectoris (HCC) 01/13/2023   Need for immunization against influenza 09/07/2022   Chronic sinusitis 07/02/2022   Headache 07/02/2022   Chronic kidney disease, stage 3b (HCC) 05/06/2022   Fatigue 02/10/2022   Allergies 11/09/2021   Constipation 11/09/2021   Cataract, nuclear sclerotic, both eyes 11/08/2018   OAB (overactive bladder) 08/11/2018   Tinnitus aurium, bilateral 08/11/2018   Renal cyst, right 05/01/2018   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   Other tear of medial meniscus, current injury, left knee, initial encounter 12/31/2015   Type II diabetes mellitus with complication (HCC) 08/14/2015   Hyperlipidemia 08/14/2015   Coronary artery disease involving native coronary artery of native heart without angina pectoris 08/14/2015   Basal cell carcinoma 08/11/2015   History of melanoma 08/11/2015   Major depression in partial remission (HCC) 08/08/2015   Non-proliferative diabetic retinopathy, both eyes (HCC) 08/08/2015   History of colon polyps 08/08/2015   Hypothyroidism, postablative 08/08/2015   Carotid artery plaque 09/03/2014   Essential (primary) hypertension 09/03/2014   APC (atrial premature contractions) 09/03/2014    Past Surgical History:  Procedure Laterality Date   ABDOMINAL HYSTERECTOMY     APPENDECTOMY     BASAL CELL CARCINOMA EXCISION     BREAST BIOPSY Right    needle bx-neg   CATARACT EXTRACTION, BILATERAL Bilateral 11/23/2018   CORONARY ARTERY BYPASS GRAFT  1996   x 3   CORONARY/GRAFT ACUTE MI REVASCULARIZATION N/A 01/13/2023   Procedure: Coronary/Graft Acute MI Revascularization;  Surgeon: Marykay Lex, MD;  Location: [facility] INVASIVE CV LAB;  Service: Cardiovascular;  Laterality: N/A;  LEFT HEART CATH AND CORONARY ANGIOGRAPHY N/A 01/13/2023   Procedure: LEFT HEART CATH AND CORONARY ANGIOGRAPHY;  Surgeon: Marykay Lex, MD;  Location: [facility] INVAS[facility] CV LAB;  Service: Cardiovascular;  Laterality: N/A;   MELANOMA EXCISION     PARTIAL  HYSTERECTOMY  1976   ovaries remain    OB History   No obstetric history on file.      Home Medications    Prior to Admission medications   Medication Sig Start Date End Date Taking? Authorizing Provider  albuterol (VENTOLIN HFA) 108 (90 Base) MCG/ACT inhaler Inhale 1-2 puffs into the lungs every 6 (six) hours as needed for wheezing or shortness of breath. 01/17/24  Yes Valentino Nose, NP  benzonatate (TESSALON) 100 MG capsule Take 1 capsule (100 mg total) by mouth 3 (three) times daily as needed for cough. Do not take with alcohol or while driving or operating heavy machinery.  May cause drowsiness. 01/17/24  Yes Valentino Nose, NP  aspirin 81 MG tablet Take by mouth every evening.     [provider]  azelastine (ASTELIN) 0.1 % nasal spray Place 1 spray into both nostrils 2 (two) times daily. Use in each nostril as directed 07/02/22   Gilmore Laroche, FNP  b complex vitamins capsule Take 1 capsule by mouth daily.    [provider]  Blood Glucose Monitoring Suppl (ACCU-CHEK GUIDE ME) w/Device KIT Use to check blood sugars twice a day , before breakfast and before bed. 10/17/23   Dani Gobble, NP  cetirizine (ZYRTEC) 5 MG tablet Take 1 tablet (5 mg total) by mouth daily. 07/02/22   Gilmore Laroche, FNP  clopidogrel (PLAVIX) 75 MG tablet Take 1 tablet (75 mg total) by mouth daily. 01/16/23   Duke, Roe Rutherford, PA  ezetimibe (ZETIA) 10 MG tablet Take 1 tablet (10 mg total) by mouth daily. 02/10/22   Donell Beers, FNP  FLUoxetine (PROZAC) 40 MG capsule Take 1 capsule (40 mg total) by mouth daily. 02/15/23   Billie Lade, MD  furosemide (LASIX) 40 MG tablet Take 1 tablet (40 mg total) by mouth as needed for edema. 3 lb weight gain overnight. 08/09/23   Maisie Fus, MD  Glucose Blood (ACCU-CHEK GUIDE TEST VI) by In Vitro route.    [provider]  glucose blood (ACCU-CHEK GUIDE TEST) test strip Use to check blood sugar before breakfast and before  bed. 10/17/23   Dani Gobble, NP  Glucose Blood (BLOOD GLUCOSE TEST STRIPS) STRP Use to check glucose twice daily 12/28/22   Dani Gobble, NP  glucose blood test strip Frequency:PHARMDIR   Dosage:0.0     Instructions:  Note:testing 3-4xqd, DX Code 250.00 Dose: 1 12/24/08   [provider]  insulin degludec (TRESIBA FLEXTOUCH) 100 UNIT/ML FlexTouch Pen Inject 12 Units into the skin at bedtime. DX Code E11.8 , Z79.4 01/04/24   Dani Gobble, NP  Insulin Pen Needle (BD PEN NEEDLE NANO U/F) 32G X 4 MM MISC Use to check glucose once daily 12/28/22   Dani Gobble, NP  levothyroxine (SYNTHROID) 50 MCG tablet Take 1 tablet (50 mcg total) by mouth daily before breakfast. Patient taking differently: Take 50 mcg by mouth at bedtime. 12/28/22 01/18/24  Dani Gobble, NP  metFORMIN (GLUCOPHAGE-XR) 500 MG 24 hr tablet Take 1 tablet (500 mg total) by mouth daily with breakfast. Resume on 01/18/23 01/04/24   Dani Gobble, NP  metoprolol succinate (TOPROL-XL) 25 MG 24 hr tablet Take  1 tablet (25 mg total) by mouth daily. 01/16/23   [facility], Roe Rutherford, PA  mupirocin ointment (BACTROBAN) 2 % Apply 1 Application topically daily. To wound under a bandage 09/21/23   Paci, Daisey Must, MD  nitroGLYCERIN (NITROSTAT) 0.4 MG SL tablet Place 1 tablet (0.4 mg total) under the tongue every 5 (five) minutes as needed for chest pain. 01/16/23 01/18/24  [facility], Roe Rutherford, PA  REPATHA SURECLICK 140 MG/ML SOAJ Inject 140 mg into the skin every 14 (fourteen) days. 12/14/23   Billie Lade, MD  sacubitril-valsartan (ENTRESTO) 24-26 MG Take 1 tablet by mouth 2 (two) times daily. 01/25/23   Maisie Fus, MD  sodium chloride (OCEAN) 0.65 % SOLN nasal spray Place 1 spray into both nostrils as needed for congestion. 11/09/21   Donell Beers, FNP  spironolactone (ALDACTONE) 25 MG tablet Take 0.5 tablets (12.5 mg total) by mouth daily. 01/25/23   Maisie Fus, MD  Study - LIBREXIA-ACS - milvexian 25 mg or  placebo tablet (PI-Stuckey) Take 1 tablet by mouth 2 (two) times daily. Take 1 tablet by mouth twice a day with or without food. Bring bottle back to every visit. Please contact [facility] for any questions or concerns regarding the medication. For Investigational Use Only. 01/09/24   Herby Abraham, MD  Vitamin D, Ergocalciferol, (DRISDOL) 1.25 MG (50000 UNIT) CAPS capsule Take 1 capsule (50,000 Units total) by mouth every 7 (seven) days. 12/08/22   Gilmore Laroche, FNP    Family History Family History  Problem Relation Age of Onset   Skin cancer Mother    Squamous cell carcinoma Mother    Basal cell carcinoma Mother    Heart disease Father    CAD Father    Breast cancer Neg Hx     Social History Social History   Tobacco Use   Smoking status: Former    Current packs/day: 0.00    Types: Cigarettes    Quit date: 06/11/1982    Years since quitting: 41.6   Smokeless tobacco: Never   Tobacco comments:    smoking cessation materials not required  Vaping Use   Vaping status: Never Used  Substance Use Topics   Alcohol use: Yes    Alcohol/week: 0.0 standard drinks of alcohol    Comment: rare 1 wine glass of wine   Drug use: No     Allergies   Propoxyphene, Lidocaine-epinephrine (pf), Other, Tape, Xylocaine  [lidocaine hcl], Statins, and Jardiance [empagliflozin]   Review of Systems Review of Systems Per HPI  Physical Exam Triage Vital Signs ED Triage Vitals  Encounter Vitals Group     BP 01/17/24 1540 104/64     Systolic BP Percentile --      Diastolic BP Percentile --      Pulse Rate 01/17/24 1540 70     Resp 01/17/24 1540 18     Temp 01/17/24 1540 98.1 F (36.7 C)     Temp Source 01/17/24 1540 Oral     SpO2 01/17/24 1540 97 %     Weight --      Height --      Head Circumference --      Peak Flow --      Pain Score 01/17/24 1543 0     Pain Loc --      Pain Education --      Exclude from Growth Chart --    No data found.  Updated Vital Signs BP  104/64 (BP Location: Right Arm)  Pulse 70   Temp 98.1 F (36.7 C) (Oral)   Resp 18   SpO2 97%   Visual Acuity Right Eye Distance:   Left Eye Distance:   Bilateral Distance:    Right Eye Near:   Left Eye Near:    Bilateral Near:     Physical Exam Vitals and nursing note reviewed.  Constitutional:      General: She is not in acute distress.    Appearance: Normal appearance. She is not ill-appearing or toxic-appearing.  HENT:     Head: Normocephalic and atraumatic.     Right Ear: Tympanic membrane, ear canal and external ear normal.     Left Ear: Tympanic membrane, ear canal and external ear normal.     Nose: Congestion present. No rhinorrhea.     Mouth/Throat:     Mouth: Mucous membranes are moist.     Pharynx: Oropharynx is clear. No oropharyngeal exudate or posterior oropharyngeal erythema.  Eyes:     General: No scleral icterus.    Extraocular Movements: Extraocular movements intact.  Cardiovascular:     Rate and Rhythm: Normal rate and regular rhythm.  Pulmonary:     Effort: Pulmonary effort is normal. No respiratory distress.     Breath sounds: Wheezing present. No rhonchi or rales.  Musculoskeletal:     Cervical back: Normal range of motion and neck supple.  Lymphadenopathy:     Cervical: No cervical adenopathy.  Skin:    General: Skin is warm and dry.     Coloration: Skin is not jaundiced or pale.     Findings: No erythema or rash.  Neurological:     Mental Status: She is alert and oriented to person, place, and time.  Psychiatric:        Behavior: Behavior is cooperative.      UC Treatments / Results  Labs (all labs ordered are listed, but only abnormal results are displayed) Labs Reviewed - No data to display  EKG   Radiology No results found.  Procedures Procedures (including critical care time)  Medications Ordered in UC Medications  albuterol (PROVENTIL) (2.5 MG/3ML) 0.083% nebulizer solution 2.5 mg (2.5 mg Nebulization Given 01/17/24  1559)    Initial Impression / Assessment and Plan / UC Course  I have reviewed the triage vital signs and the nursing notes.  Pertinent labs & imaging results that were available during my care of the patient were reviewed by me and considered in my medical decision making (see chart for details).   Patient is well-appearing, normotensive, afebrile, not tachycardic, not tachypneic, oxygenating well on room air.   1. Viral URI with cough 2. Wheezing Vitals and exam today are reassuring Suspect viral etiology Given length of symptoms, viral testing deferred Wheezing on exam, treated with albuterol nebulizer; patient tolerated well and improved air movement bilaterally with improvement in wheezing shortly after Start albuterol inhaler at home scheduled every 6 hours for 48 hours, then as needed Other supportive care discussed with patient including guaifenesin, cough suppressant medication ER and return precautions discussed with patient  The patient was given the opportunity to ask questions.  All questions answered to their satisfaction.  The patient is in agreement to this plan.   Final Clinical Impressions(s) / UC Diagnoses   Final diagnoses:  Viral URI with cough  Wheezing     Discharge Instructions      As we discussed, your symptoms are likely caused by a viral infection.  We gave you a breathing treatment today  which helped open up your airway.  Continue to use the rescue inhaler every 4-6 hours as needed for wheezing or shortness of breath.  Also recommend starting guaifenesin 600 mg twice daily to help the mucus thin out.  You can take the cough suppressant medicine every 8 hours as needed for dry cough.  Seek care if symptoms do not improve or if they worsen despite treatment.      ED Prescriptions     Medication Sig Dispense Auth. Provider   albuterol (VENTOLIN HFA) 108 (90 Base) MCG/ACT inhaler Inhale 1-2 puffs into the lungs every 6 (six) hours as needed for  wheezing or shortness of breath. 1 each Valentino Nose, NP   benzonatate (TESSALON) 100 MG capsule Take 1 capsule (100 mg total) by mouth 3 (three) times daily as needed for cough. Do not take with alcohol or while driving or operating heavy machinery.  May cause drowsiness. 21 capsule Valentino Nose, NP      PDMP not reviewed this encounter.   Valentino Nose, NP 01/18/24 [phone]

## 2024-01-17 NOTE — ED Triage Notes (Signed)
 Pt reports she has bilateral ear fullness, chest congestion, lethargic, and runny nose x 5 days

## 2024-01-18 ENCOUNTER — Ambulatory Visit (INDEPENDENT_AMBULATORY_CARE_PROVIDER_SITE_OTHER): Payer: 59

## 2024-01-18 VITALS — Ht 66.0 in | Wt 161.0 lb

## 2024-01-18 DIAGNOSIS — Z1231 Encounter for screening mammogram for malignant neoplasm of breast: Secondary | ICD-10-CM

## 2024-01-18 DIAGNOSIS — Z780 Asymptomatic menopausal state: Secondary | ICD-10-CM

## 2024-01-18 DIAGNOSIS — E119 Type 2 diabetes mellitus without complications: Secondary | ICD-10-CM

## 2024-01-18 DIAGNOSIS — Z0000 Encounter for general adult medical examination without abnormal findings: Secondary | ICD-10-CM | POA: Diagnosis not present

## 2024-01-18 NOTE — Progress Notes (Signed)
 Because this visit was a virtual/telehealth visit,  certain criteria was not obtained, such a blood pressure, CBG if applicable, and timed get up and go. Any medications not marked as "taking" were not mentioned during the medication reconciliation part of the visit. Any vitals not documented were not able to be obtained due to this being a telehealth visit or patient was unable to self-report a recent blood pressure reading due to a lack of equipment at home via telehealth. Vitals that have been documented are verbally provided by the patient.   Subjective:   Angela Rose is a 78 y.o. who presents for a Medicare Wellness preventive visit.  Visit Complete: Virtual I connected with  Angela Rose on 01/18/24 by a audio enabled telemedicine application and verified that I am speaking with the correct person using two identifiers.  Patient Location: Home  Provider Location: Home Office  I discussed the limitations of evaluation and management by telemedicine. The patient expressed understanding and agreed to proceed.  Vital Signs: Because this visit was a virtual/telehealth visit, some criteria may be missing or patient reported. Any vitals not documented were not able to be obtained and vitals that have been documented are patient reported.  VideoDeclined- This patient declined Librarian, academic. Therefore the visit was completed with audio only.  Persons Participating in Visit: Patient.  AWV Questionnaire: No: Patient Medicare AWV questionnaire was not completed prior to this visit.  Cardiac Risk Factors include: advanced age (>72men, >24 women);diabetes mellitus;dyslipidemia;sedentary lifestyle;hypertension;Other (see comment), Risk factor comments: CHF     Objective:    Today's Vitals   01/18/24 1408 01/18/24 1420  Weight: 161 lb (73 kg)   Height: 5\' 6"  (1.676 m)   PainSc:  0-No pain   Body mass index is 25.99 kg/m.     01/18/2024     2:53 PM 05/03/2023   12:46 PM 01/12/2023   11:27 PM 01/05/2023   10:16 AM 09/15/2022    4:51 PM 12/14/2021    2:08 PM 10/15/2019    2:23 PM  Advanced Directives  Does Patient Have a Medical Advance Directive? Yes Yes No No No No;Yes Yes  Type of Estate agent of [place];Living will     Healthcare Power of [employer] of [place];Living will  Does patient want to make changes to medical advance directive? No - Patient declined No - Patient declined    No - Patient declined Yes (MAU/Ambulatory/Procedural Areas - Information given)  Copy of Healthcare Power of Attorney in Chart? Yes - validated most recent copy scanned in chart (See row information)     No - copy requested No - copy requested  Would patient like information on creating a medical advance directive?   No - Patient declined Yes (MAU/Ambulatory/Procedural Areas - Information given)  No - Patient declined     Current Medications (verified) Outpatient Encounter Medications as of 01/18/2024  Medication Sig   albuterol (VENTOLIN HFA) 108 (90 Base) MCG/ACT inhaler Inhale 1-2 puffs into the lungs every 6 (six) hours as needed for wheezing or shortness of breath.   aspirin 81 MG tablet Take by mouth every evening.    azelastine (ASTELIN) 0.1 % nasal spray Place 1 spray into both nostrils 2 (two) times daily. Use in each nostril as directed   b complex vitamins capsule Take 1 capsule by mouth daily.   benzonatate (TESSALON) 100 MG capsule Take 1 capsule (100 mg total) by mouth 3 (three) times daily as needed  for cough. Do not take with alcohol or while driving or operating heavy machinery.  May cause drowsiness.   Blood Glucose Monitoring Suppl (ACCU-CHEK GUIDE ME) w/Device KIT Use to check blood sugars twice a day , before breakfast and before bed.   cetirizine (ZYRTEC) 5 MG tablet Take 1 tablet (5 mg total) by mouth daily.   clopidogrel (PLAVIX) 75 MG tablet Take 1 tablet (75 mg total) by mouth daily.    ezetimibe (ZETIA) 10 MG tablet Take 1 tablet (10 mg total) by mouth daily.   FLUoxetine (PROZAC) 40 MG capsule Take 1 capsule (40 mg total) by mouth daily.   furosemide (LASIX) 40 MG tablet Take 1 tablet (40 mg total) by mouth as needed for edema. 3 lb weight gain overnight.   Glucose Blood (ACCU-CHEK GUIDE TEST VI) by In Vitro route.   glucose blood (ACCU-CHEK GUIDE TEST) test strip Use to check blood sugar before breakfast and before bed.   Glucose Blood (BLOOD GLUCOSE TEST STRIPS) STRP Use to check glucose twice daily   glucose blood test strip Frequency:PHARMDIR   Dosage:0.0     Instructions:  Note:testing 3-4xqd, DX Code 250.00 Dose: 1   insulin degludec (TRESIBA FLEXTOUCH) 100 UNIT/ML FlexTouch Pen Inject 12 Units into the skin at bedtime. DX Code E11.8 , Z79.4   Insulin Pen Needle (BD PEN NEEDLE NANO U/F) 32G X 4 MM MISC Use to check glucose once daily   levothyroxine (SYNTHROID) 50 MCG tablet Take 1 tablet (50 mcg total) by mouth daily before breakfast. (Patient taking differently: Take 50 mcg by mouth at bedtime.)   metFORMIN (GLUCOPHAGE-XR) 500 MG 24 hr tablet Take 1 tablet (500 mg total) by mouth daily with breakfast. Resume on 01/18/23   metoprolol succinate (TOPROL-XL) 25 MG 24 hr tablet Take 1 tablet (25 mg total) by mouth daily.   mupirocin ointment (BACTROBAN) 2 % Apply 1 Application topically daily. To wound under a bandage   nitroGLYCERIN (NITROSTAT) 0.4 MG SL tablet Place 1 tablet (0.4 mg total) under the tongue every 5 (five) minutes as needed for chest pain.   REPATHA SURECLICK 140 MG/ML SOAJ Inject 140 mg into the skin every 14 (fourteen) days.   sacubitril-valsartan (ENTRESTO) 24-26 MG Take 1 tablet by mouth 2 (two) times daily.   sodium chloride (OCEAN) 0.65 % SOLN nasal spray Place 1 spray into both nostrils as needed for congestion.   spironolactone (ALDACTONE) 25 MG tablet Take 0.5 tablets (12.5 mg total) by mouth daily.   Study - LIBREXIA-ACS - milvexian 25 mg or placebo  tablet (PI-Stuckey) Take 1 tablet by mouth 2 (two) times daily. Take 1 tablet by mouth twice a day with or without food. Bring bottle back to every visit. Please contact [facility] for any questions or concerns regarding the medication. For Investigational Use Only.   Vitamin D, Ergocalciferol, (DRISDOL) 1.25 MG (50000 UNIT) CAPS capsule Take 1 capsule (50,000 Units total) by mouth every 7 (seven) days.   No facility-administered encounter medications on file as of 01/18/2024.    Allergies (verified) Propoxyphene, Lidocaine-epinephrine (pf), Other, Tape, Xylocaine  [lidocaine hcl], Statins, and Jardiance [empagliflozin]   History: Past Medical History:  Diagnosis Date   Anxiety 07/10/2014   Basal cell carcinoma    Cancer (HCC) 12/13/2011   melanoma face   CHF (congestive heart failure), NYHA class II, acute on chronic, combined (HCC) 01/14/2023   Chronic kidney disease (CKD), stage III (moderate) (HCC) 02/17/2022   Chronic sinusitis 07/02/2022   Depression 07/10/2014  Diabetes mellitus without complication (HCC) 08/21/2014   Fracture of tibial plateau 12/31/2015   Hyperlipidemia 08/14/2015   Hypertension 09/03/2014   Hypothyroidism, postablative 02/08/2014   Melanoma (HCC)    Myocardial infarction (HCC) 1993   Tick bites 10/29/2014   Vitamin D deficiency 12/08/2022   Past Surgical History:  Procedure Laterality Date   ABDOMINAL HYSTERECTOMY     APPENDECTOMY     BASAL CELL CARCINOMA EXCISION     BREAST BIOPSY Right    needle bx-neg   CATARACT EXTRACTION, BILATERAL Bilateral 11/23/2018   CORONARY ARTERY BYPASS GRAFT  1996   x 3   CORONARY/GRAFT ACUTE MI REVASCULARIZATION N/A 01/13/2023   Procedure: Coronary/Graft Acute MI Revascularization;  Surgeon: Marykay Lex, MD;  Location: [facility] INVASIVE CV LAB;  Service: Cardiovascular;  Laterality: N/A;   LEFT HEART CATH AND CORONARY ANGIOGRAPHY N/A 01/13/2023   Procedure: LEFT HEART CATH AND CORONARY ANGIOGRAPHY;  Surgeon:  Marykay Lex, MD;  Location: [facility] INVASIVE CV LAB;  Service: Cardiovascular;  Laterality: N/A;   MELANOMA EXCISION     PARTIAL HYSTERECTOMY  1976   ovaries remain   Family History  Problem Relation Age of Onset   Skin cancer Mother    Squamous cell carcinoma Mother    Basal cell carcinoma Mother    Heart disease Father    CAD Father    Breast cancer Neg Hx    Social History   Socioeconomic History   Marital status: Significant Other    Spouse name: Not on file   Number of children: 3   Years of education: Not on file   Highest education level: Some college, no degree  Occupational History   Occupation: retired  Tobacco Use   Smoking status: Former    Current packs/day: 0.00    Types: Cigarettes    Quit date: 06/11/1982    Years since quitting: 41.6   Smokeless tobacco: Never   Tobacco comments:    smoking cessation materials not required  Vaping Use   Vaping status: Never Used  Substance and Sexual Activity   Alcohol use: Yes    Alcohol/week: 0.0 standard drinks of alcohol    Comment: rare 1 wine glass of wine   Drug use: No   Sexual activity: Not on file  Other Topics Concern   Not on file  Social History Narrative   Not on file   Social Drivers of Health   Financial Resource Strain: Low Risk  (01/18/2024)   Overall Financial Resource Strain (CARDIA)    Difficulty of Paying Living Expenses: Not hard at all  Food Insecurity: No Food Insecurity (01/18/2024)   Hunger Vital Sign    Worried About Running Out of Food in the Last Year: Never true    Ran Out of Food in the Last Year: Never true  Transportation Needs: No Transportation Needs (01/18/2024)   PRAPARE - Administrator, Civil Service (Medical): No    Lack of Transportation (Non-Medical): No  Physical Activity: Inactive (01/18/2024)   Exercise Vital Sign    Days of Exercise per Week: 0 days    Minutes of Exercise per Session: 0 min  Stress: No Stress Concern Present (01/18/2024)   [employer]  of Occupational Health - Occupational Stress Questionnaire    Feeling of Stress : Not at all  Social Connections: Moderately Isolated (01/18/2024)   Social Connection and Isolation Panel [NHANES]    Frequency of Communication with Friends and Family: Three times a week  Frequency of Social Gatherings with Friends and Family: Once a week    Attends Religious Services: Never    Database administrator or Organizations: No    Attends Engineer, structural: Not on file    Marital Status: Living with partner    Tobacco Counseling Counseling given: Yes Tobacco comments: smoking cessation materials not required    Clinical Intake:  Pre-visit preparation completed: Yes  Pain : No/denies pain Pain Score: 0-No pain     BMI - recorded: 25.99 Nutritional Risks: None Diabetes: Yes CBG done?: No (telehealth visit.) Did pt. bring in CBG monitor from home?: No  Lab Results  Component Value Date   HGBA1C 7.5 (A) 01/04/2024   HGBA1C 8.4 (A) 09/05/2023   HGBA1C 7.8 (A) 05/02/2023     How often do you need to have someone help you when you read instructions, pamphlets, or other written materials from your doctor or pharmacy?: 1 - Never  Interpreter Needed?: No  Information entered by :: Maryjean Ka CMA   Activities of Daily Living     01/18/2024    2:53 PM  In your present state of health, do you have any difficulty performing the following activities:  Hearing? 1  Comment deaf in LT ear. wears hearing aid  Vision? 0  Difficulty concentrating or making decisions? 0  Walking or climbing stairs? 0  Dressing or bathing? 0  Doing errands, shopping? 0  Preparing Food and eating ? Y  Comment husband does most of the cooking  Using the Toilet? N  In the past six months, have you accidently leaked urine? N  Do you have problems with loss of bowel control? N  Managing your Medications? N  Managing your Finances? N  Housekeeping or managing your Housekeeping? N    Patient Care  Team: Billie Lade, MD as PCP - General (Internal Medicine) Maisie Fus, MD as PCP - Cardiology (Cardiology) Vanna Scotland, MD as Consulting Physician (Urology) Lajean Manes, [facility] (Inactive) (Pharmacist) Dani Gobble, NP as Nurse Practitioner (Endocrinology) Helane Gunther, DPM as Consulting Physician (Podiatry) Paci, Daisey Must, MD (Dermatology) Elwin Mocha, MD as Consulting Physician (Ophthalmology)  Indicate any recent Medical Services you may have received from other than [facility] providers in the past year (date may be approximate).     Assessment:   This is a routine wellness examination for Angela Rose.  Hearing/Vision screen Hearing Screening - Comments:: Dear in LT ear. Wears hearing aids  Vision Screening - Comments:: Patient wears reading glasses only. Up to date with yearly exams.  Sees Dr. Christiana Fuchs in [place]   Goals Addressed             This Visit's Progress    Patient Stated       I want to go to [place] and visit my son and grandchildren. I [place] to [place] and watch the birds and fish and the lack of humans.        Depression Screen     01/18/2024    2:55 PM 10/27/2023    8:51 AM 09/05/2023    3:38 PM 06/14/2023    3:33 PM 05/03/2023    1:09 PM 03/23/2023    2:47 PM 01/05/2023   10:20 AM  PHQ 2/9 Scores  PHQ - 2 Score 0 0 0 0 2 2 2   PHQ- 9 Score 0 0 0  10 4     Fall Risk     01/18/2024  2:52 PM 10/27/2023    8:51 AM 09/05/2023    3:38 PM 06/14/2023    3:33 PM 05/03/2023   12:46 PM  Fall Risk   Falls in the past year? 0 0 0 0 0  Number falls in past yr: 0 0 0 0 0  Injury with Fall? 0 0 0 0   Risk for fall due to : Impaired balance/gait;Orthopedic patient;Impaired mobility No Fall Risks No Fall Risks  Impaired balance/gait;Impaired mobility;Impaired vision  Follow up Falls prevention discussed;Education provided Falls evaluation completed Falls evaluation completed  Falls prevention discussed    MEDICARE RISK AT HOME:   Medicare Risk at Home Any stairs in or around the home?: Yes If so, are there any without handrails?: No Home free of loose throw rugs in walkways, pet beds, electrical cords, etc?: Yes Adequate lighting in your home to reduce risk of falls?: Yes Life alert?: No Use of a cane, walker or w/c?: No Grab bars in the bathroom?: Yes Shower chair or bench in shower?: No (has one but does not use it) Elevated toilet seat or a handicapped toilet?: Yes  TIMED UP AND GO:  Was the test performed?  No  Cognitive Function: Declined: Patient declined cognitive screening, but was able to answer questions in an accurate and timely manner. No cognitive impairments observed.    12/14/2021    2:14 PM  MMSE - Mini Mental State Exam  Not completed: Unable to complete        01/18/2024    2:31 PM 01/05/2023   10:21 AM 12/14/2021    2:14 PM 10/15/2019    2:45 PM 09/20/2018    2:07 PM  6CIT Screen  What Year? 0 points 0 points 0 points 0 points 0 points  What month? 0 points 0 points 0 points 0 points 0 points  What time? 0 points 0 points 0 points 0 points 0 points  Count back from 20 0 points 0 points 0 points 0 points 0 points  Months in reverse 0 points 0 points 0 points 0 points 0 points  Repeat phrase 0 points 0 points 0 points 0 points 2 points  Total Score 0 points 0 points 0 points 0 points 2 points    Immunizations Immunization History  Administered Date(s) Administered   Fluad Quad(high Dose 65+) 07/25/2019, 08/20/2020, 09/30/2021, 09/07/2022   Fluad Trivalent(High Dose 65+) 06/14/2023   Influenza, High Dose Seasonal PF 06/20/2017, 06/23/2018   Influenza,inj,Quad PF,6+ Mos 08/14/2015, 09/27/2016   PFIZER(Purple Top)SARS-COV-2 Vaccination 12/01/2019, 12/23/2019, 07/20/2020, 04/24/2021   Pneumococcal Conjugate-13 02/16/2016   Pneumococcal Polysaccharide-23 11/15/2008, 10/14/2014    Screening Tests Health Maintenance  Topic Date Due   Zoster Vaccines- Shingrix (1 of 2) Never done    DEXA SCAN  11/13/2020   COVID-19 Vaccine (5 - 2024-25 season) 06/12/2023   MAMMOGRAM  11/27/2023   Diabetic kidney evaluation - Urine ACR  12/07/2023   INFLUENZA VACCINE  05/11/2024   HEMOGLOBIN A1C  07/06/2024   Diabetic kidney evaluation - eGFR measurement  10/26/2024   OPHTHALMOLOGY EXAM  11/06/2024   FOOT EXAM  01/03/2025   Medicare Annual Wellness (AWV)  01/17/2025   Pneumonia Vaccine 6+ Years old  Completed   HPV VACCINES  Aged Out   DTaP/Tdap/Td  Discontinued   Colonoscopy  Discontinued   Hepatitis C Screening  Discontinued    Health Maintenance  Health Maintenance Due  Topic Date Due   Zoster Vaccines- Shingrix (1 of 2) Never done   DEXA SCAN  11/13/2020   COVID-19 Vaccine (5 - 2024-25 season) 06/12/2023   MAMMOGRAM  11/27/2023   Diabetic kidney evaluation - Urine ACR  12/07/2023   Health Maintenance Items Addressed: Mammogram ordered, DEXA ordered, UACR (Urine Albumin:Creatinine Ratio)  Additional Screening:  Vision Screening: Recommended annual ophthalmology exams for early detection of glaucoma and other disorders of the eye.  Dental Screening: Recommended annual dental exams for proper oral hygiene  Community Resource Referral / Chronic Care Management: CRR required this visit?  No   CCM required this visit?  No     Plan:     I have personally reviewed and noted the following in the patient's chart:   Medical and social history Use of alcohol, tobacco or illicit drugs  Current medications and supplements including opioid prescriptions. Patient is not currently taking opioid prescriptions. Functional ability and status Nutritional status Physical activity Advanced directives List of other physicians Hospitalizations, surgeries, and ER visits in previous 12 months Vitals Screenings to include cognitive, depression, and falls Referrals and appointments  In addition, I have reviewed and discussed with patient certain preventive protocols, quality  metrics, and best practice recommendations. A written personalized care plan for preventive services as well as general preventive health recommendations were provided to patient.     Jordan Hawks Cilicia Borden, CMA   01/18/2024   After Visit Summary: (MyChart) Due to this being a telephonic visit, the after visit summary with patients personalized plan was offered to patient via MyChart   Notes: Please refer to Routing Comments.

## 2024-01-18 NOTE — Patient Instructions (Signed)
 Angela Rose , Thank you for taking time to come for your Medicare Wellness Visit. I appreciate your ongoing commitment to your health goals. Please review the following plan we discussed and let me know if I can assist you in the future.   Please see your treatment plan below: Referrals:No referrals were placed today Preventative Screenings:An order has been placed for you to have your yearly mammogram. Please call the number below to schedule your appointment  [facility] at [facility] [address] -Radiology [place], [place] [address] [phone] Before you osteoporosis screening:  -Discontinue any medications that contain calcium at least 48 hours (2 days) prior to your scan. -Bring list of medications you are currently taking including any supplements.  -Make sure you wear comfortable 2 piece clothing   Labs:Please have the following labs drawn at [employer] at [facility]. You do not have to schedule an appointment for this.  A diabetic urine test has been ordered for you today. You may have this completed at the same lab that you get yearly labs drawn for your primary care provider.  Follow-Up:Next Medicare AWV: January 22, 2025 at 10:40 am video visit  Clinician Recommendations: Aim for 30 minutes of exercise or brisk walking, 6-8 glasses of water, and 5 servings of fruits and vegetables each day.  This is a list of the screening recommended for you and due dates:  Health Maintenance  Topic Date Due   Zoster (Shingles) Vaccine (1 of 2) Never done   DEXA scan (bone density measurement)  11/13/2020   COVID-19 Vaccine (5 - 2024-25 season) 06/12/2023   Mammogram  11/27/2023   Yearly kidney health urinalysis for diabetes  12/07/2023   Flu Shot  05/11/2024   Hemoglobin A1C  07/06/2024   Yearly kidney function blood test for diabetes  10/26/2024   Eye exam for diabetics  11/06/2024   Complete foot exam   01/03/2025   Medicare Annual Wellness Visit   01/17/2025   Pneumonia Vaccine  Completed   HPV Vaccine  Aged Out   DTaP/Tdap/Td vaccine  Discontinued   Colon Cancer Screening  Discontinued   Hepatitis C Screening  Discontinued    Advanced directives: (In Chart) A copy of your advanced directives are scanned into your chart should your provider ever need it.   Advance Care Planning is important because it:  [x]  Makes sure you receive the medical care that is consistent with your values, goals, and preferences  [x]  It provides guidance to your family and loved ones and it also reduces their decisional burden about whether or not they are making the right decisions based on what you want done  Follow the link provided in your after visit summary or read over the paperwork we have mailed to you to help you started getting your Advance Directives in place. If you need assistance in completing these, please reach out to [place] so that we can help you!   Next Medicare Annual Wellness Visit scheduled for next year: yes  Understanding Your Risk for Falls Millions of people have serious injuries from falls each year. It is important to understand your risk of falling. Talk with your health care provider about your risk and what you can do to lower it. If you do have a serious fall, make sure to tell your provider. Falling once raises your risk of falling again. How can falls affect me? Serious injuries from falls are common. These include: Broken bones, such as hip  fractures. Head injuries, such as traumatic brain injuries (TBI) or concussions. A fear of falling can cause you to avoid activities and stay at home. This can make your muscles weaker and raise your risk for a fall. What can increase my risk? There are a number of risk factors that increase your risk for falling. The more risk factors you have, the higher your risk of falling. Serious injuries from a fall happen most often to people who are older than 80 years old. Teenagers and young  adults ages 65-29 are also at higher risk. Common risk factors include: Weakness in the lower body. Being generally weak or confused due to long-term (chronic) illness. Dizziness or balance problems. Poor vision. Medicines that cause dizziness or drowsiness. These may include: Medicines for your blood pressure, heart, anxiety, insomnia, or swelling (edema). Pain medicines. Muscle relaxants. Other risk factors include: Drinking alcohol. Having had a fall in the past. Having foot pain or wearing improper footwear. Working at a dangerous job. Having any of the following in your home: Tripping hazards, such as floor clutter or loose rugs. Poor lighting. Pets. Having dementia or memory loss. What actions can I take to lower my risk of falling?     Physical activity Stay physically fit. Do strength and balance exercises. Consider taking a regular class to build strength and balance. Yoga and tai chi are good options. Vision Have your eyes checked every year and your prescription for glasses or contacts updated as needed. Shoes and walking aids Wear non-skid shoes. Wear shoes that have rubber soles and low heels. Do not wear high heels. Do not walk around the house in socks or slippers. Use a cane or walker as told by your provider. Home safety Attach secure railings on both sides of your stairs. Install grab bars for your bathtub, shower, and toilet. Use a non-skid mat in your bathtub or shower. Attach bath mats securely with double-sided, non-slip rug tape. Use good lighting in all rooms. Keep a flashlight near your bed. Make sure there is a clear path from your bed to the bathroom. Use night-lights. Do not use throw rugs. Make sure all carpeting is taped or tacked down securely. Remove all clutter from walkways and stairways, including extension cords. Repair uneven or broken steps and floors. Avoid walking on icy or slippery surfaces. Walk on the grass instead of on icy or  slick sidewalks. Use ice melter to get rid of ice on walkways in the winter. Use a cordless phone. Questions to ask your health care provider Can you help me check my risk for a fall? Do any of my medicines make me more likely to fall? Should I take a vitamin D supplement? What exercises can I do to improve my strength and balance? Should I make an appointment to have my vision checked? Do I need a bone density test to check for weak bones (osteoporosis)? Would it help to use a cane or a walker? Where to find more information Centers for Disease Control and Prevention, STEADI: TonerPromos.no Community-Based Fall Prevention Programs: TonerPromos.no [employer] on Aging: BaseRingTones.pl Contact a health care provider if: You fall at home. You are afraid of falling at home. You feel weak, drowsy, or dizzy. This information is not intended to replace advice given to you by your health care provider. Make sure you discuss any questions you have with your health care provider. Document Revised: 05/31/2022 Document Reviewed: 05/31/2022 Elsevier Patient Education  2024 [employer].

## 2024-01-19 ENCOUNTER — Other Ambulatory Visit: Payer: Self-pay | Admitting: Internal Medicine

## 2024-01-21 ENCOUNTER — Other Ambulatory Visit: Payer: Self-pay | Admitting: Internal Medicine

## 2024-01-25 ENCOUNTER — Ambulatory Visit (HOSPITAL_COMMUNITY): Payer: 59

## 2024-01-25 ENCOUNTER — Ambulatory Visit: Payer: 59 | Admitting: Internal Medicine

## 2024-01-25 ENCOUNTER — Encounter (HOSPITAL_COMMUNITY): Payer: Self-pay

## 2024-01-26 DIAGNOSIS — E119 Type 2 diabetes mellitus without complications: Secondary | ICD-10-CM | POA: Diagnosis not present

## 2024-02-01 ENCOUNTER — Telehealth: Payer: Self-pay

## 2024-02-01 NOTE — Telephone Encounter (Signed)
 Lvm to call office back

## 2024-02-01 NOTE — Telephone Encounter (Signed)
 Copied from CRM [id number]. Topic: Clinical - Lab/Test Results >> Feb 01, 2024 10:06 AM Oddis Bench wrote: Reason for CRM: Angela Rose is calling [facility] cb [phone] Mon-fri she is calling to go over results that are abnormal for the patient.

## 2024-02-08 ENCOUNTER — Other Ambulatory Visit: Payer: Self-pay | Admitting: Internal Medicine

## 2024-02-08 ENCOUNTER — Telehealth: Payer: Self-pay | Admitting: Internal Medicine

## 2024-02-08 NOTE — Telephone Encounter (Signed)
*  STAT* If patient is at the pharmacy, call can be transferred to refill team.   1. Which medications need to be refilled? (please list name of each medication and dose if known) clopidogrel  (PLAVIX ) 75 MG tablet   metoprolol  succinate (TOPROL -XL) 25 MG 24 hr tablet   2. Which pharmacy/location (including street and city if local pharmacy) is medication to be sent to?  [facility] [phone] - [place], [place] - [address] AT [facility] [address]    3. Do they need a 30 day or 90 day supply? 90

## 2024-02-09 ENCOUNTER — Other Ambulatory Visit: Payer: Self-pay | Admitting: Internal Medicine

## 2024-02-09 DIAGNOSIS — F324 Major depressive disorder, single episode, in partial remission: Secondary | ICD-10-CM

## 2024-02-09 MED ORDER — METOPROLOL SUCCINATE ER 25 MG PO TB24: 25.0000 mg | ORAL_TABLET | Freq: Every day | ORAL | 1 refills | Status: DC

## 2024-02-09 MED ORDER — CLOPIDOGREL BISULFATE 75 MG PO TABS: 75.0000 mg | ORAL_TABLET | Freq: Every day | ORAL | 1 refills | Status: DC

## 2024-02-09 NOTE — Telephone Encounter (Signed)
 Pt's medications were sent to pt's pharmacy as requested. Confirmation received.

## 2024-02-13 ENCOUNTER — Ambulatory Visit: Attending: Cardiology | Admitting: Cardiology

## 2024-02-13 ENCOUNTER — Other Ambulatory Visit (HOSPITAL_COMMUNITY): Payer: Self-pay

## 2024-02-13 ENCOUNTER — Encounter: Payer: Self-pay | Admitting: Cardiology

## 2024-02-13 VITALS — BP 137/66 | HR 60 | Resp 16 | Ht 66.0 in

## 2024-02-13 DIAGNOSIS — E785 Hyperlipidemia, unspecified: Secondary | ICD-10-CM | POA: Diagnosis not present

## 2024-02-13 DIAGNOSIS — I25118 Atherosclerotic heart disease of native coronary artery with other forms of angina pectoris: Secondary | ICD-10-CM

## 2024-02-13 DIAGNOSIS — I25708 Atherosclerosis of coronary artery bypass graft(s), unspecified, with other forms of angina pectoris: Secondary | ICD-10-CM | POA: Diagnosis not present

## 2024-02-13 DIAGNOSIS — I10 Essential (primary) hypertension: Secondary | ICD-10-CM | POA: Diagnosis not present

## 2024-02-13 MED ORDER — ISOSORBIDE MONONITRATE ER 30 MG PO TB24
30.0000 mg | ORAL_TABLET | Freq: Every day | ORAL | 3 refills | Status: DC
  Filled 2024-02-13: qty 90, 90d supply, fill #0

## 2024-02-13 NOTE — Progress Notes (Signed)
 Cardiology Office Note:  .   Date:  02/13/2024  ID:  Angela Rose, DOB 01-03-1946, MRN [id number] PCP: Tobi Fortes, MD  [facility] Providers Cardiologist:  Knox Perl, MD   History of Present Illness: .   Angela Rose is a 78 y.o. female with a hx of MI in 1993 with coronary angioplasty and stenting, CABG 1996, HTN, HLD, PAD, former smoker, carotid atherosclerosis.   Discussed the use of AI scribe software for clinical note transcription with the patient, who gave verbal consent to proceed.  History of Present Illness Angela Rose is an 78 year old female with coronary artery disease who presents with recent chest discomfort.  She experienced a burning sensation in her chest two to three days ago during the night, which was relieved by nitroglycerin . She denies frequent chest tightness or heaviness. She is currently enrolled in a clinical trial for Librexia and takes Repatha  for cholesterol management, having started it in January due to statin intolerance. Her current medications include aspirin , Plavix , and the research medication from the trial. She experiences fatigue quickly during activities such as cooking.  Labs   Lab Results  Component Value Date   CHOL 205 (H) 10/27/2023   HDL 45 10/27/2023   LDLCALC 132 (H) 10/27/2023   TRIG 158 (H) 10/27/2023   CHOLHDL 4.6 (H) 10/27/2023   Lab Results  Component Value Date   NA 143 10/27/2023   K 4.6 10/27/2023   CO2 21 10/27/2023   GLUCOSE 76 10/27/2023   BUN 24 10/27/2023   CREATININE 1.34 (H) 10/27/2023   CALCIUM  8.9 10/27/2023   EGFR 40 (L) 10/27/2023   GFRNONAA 40 (L) 01/16/2023      Latest Ref Rng & Units 10/27/2023    9:32 AM 09/02/2023   12:06 PM 03/23/2023    3:29 PM  BMP  Glucose 70 - 99 mg/dL 76  782  98   BUN 8 - 27 mg/dL 24  30  31    Creatinine 0.57 - 1.00 mg/dL 9.56  2.13  0.86   BUN/Creat Ratio 12 - 28 18  19  22    Sodium 134 - 144 mmol/L 143  139  139   Potassium 3.5 -  5.2 mmol/L 4.6  5.7  5.1   Chloride 96 - 106 mmol/L 104  103  101   CO2 20 - 29 mmol/L 21  22  22    Calcium  8.7 - 10.3 mg/dL 8.9  9.0  9.4       Latest Ref Rng & Units 01/15/2023    1:56 AM 01/14/2023    6:41 AM 01/13/2023    3:15 AM  CBC  WBC 4.0 - 10.5 K/uL 7.9  7.0  8.7   Hemoglobin 12.0 - 15.0 g/dL 57.8  46.9  62.9   Hematocrit 36.0 - 46.0 % 33.0  32.3  35.4   Platelets 150 - 400 K/uL 242  240  253    Lab Results  Component Value Date   HGBA1C 7.5 (A) 01/04/2024    ROS  Review of Systems  Cardiovascular:  Positive for chest pain. Negative for dyspnea on exertion and leg swelling.    Physical Exam:   VS:  BP 137/66 (BP Location: Left Arm, Patient Position: Sitting, Cuff Size: Normal)   Pulse 60   Resp 16   Ht 5\' 6"  (1.676 m)   SpO2 96%   BMI 25.99 kg/m    Wt Readings from Last 3 Encounters:  01/18/24 161  lb (73 kg)  01/04/24 167 lb 9.6 oz (76 kg)  10/27/23 165 lb 9.6 oz (75.1 kg)    Physical Exam Neck:     Vascular: No carotid bruit or JVD.  Cardiovascular:     Rate and Rhythm: Normal rate and regular rhythm.     Pulses: Intact distal pulses.     Heart sounds: Normal heart sounds. No murmur heard.    No gallop.  Pulmonary:     Effort: Pulmonary effort is normal.     Breath sounds: Normal breath sounds.  Abdominal:     General: Bowel sounds are normal.     Palpations: Abdomen is soft.  Musculoskeletal:     Right lower leg: No edema.     Left lower leg: No edema.    Studies Reviewed: Aaron Aas    CARDIAC CATHETERIZATION 01/13/2023   99% proximal sequential SVG-OM2-d RCA: The culprit lesion appears to be the downstream segment of the sequential vein graft, however unable to cross the upstream 99% in the first leg even with his 1.0 mm balloon. Likely no percutaneous options.   EKG:    EKG Interpretation Date/Time:  Monday Feb 13 2024 15:19:30 EDT Ventricular Rate:  61 PR Interval:  330 QRS Duration:  96 QT Interval:  410 QTC Calculation: 412 R  Axis:   54  Text Interpretation: EKG 02/13/2024: Sinus rhythm with first-degree block at rate of 61 bpm, normal axis, poor R progression, probably normal variant.  Nonspecific T abnormality.  Compared to 01/13/2023, frequent PVCs not present. Confirmed by Brinden Kincheloe, Jagadeesh ([id number]) on 02/13/2024 4:13:01 PM    Medications and allergies    Allergies  Allergen Reactions   Propoxyphene Nausea And Vomiting    Other reaction(s): Vomiting   Lidocaine -Epinephrine  (Pf)     Other reaction(s): Other (See Comments) palpitatins and chest pain   Other Other (See Comments)    sutures - in her hand-very irritated   Tape Rash    BANDAIDS. BANDAIDS   Xylocaine   [Lidocaine  Hcl]     Other reaction(s): Other (See Comments) palpitatins and chest pain   Statins     Muscle pain and weakness in legs   Jardiance  [Empagliflozin ] Other (See Comments)    Recurrent vaginal infections     Current Outpatient Medications:    albuterol  (VENTOLIN  HFA) 108 (90 Base) MCG/ACT inhaler, INHALE 1 TO 2 PUFFS INTO THE LUNGS EVERY 6 HOURS AS NEEDED FOR WHEEZING OR SHORTNESS OF BREATH, Disp: 18 g, Rfl: 1   azelastine  (ASTELIN ) 0.1 % nasal spray, Place 1 spray into both nostrils 2 (two) times daily. Use in each nostril as directed, Disp: 30 mL, Rfl: 1   b complex vitamins capsule, Take 1 capsule by mouth daily., Disp: , Rfl:    benzonatate  (TESSALON ) 100 MG capsule, Take 1 capsule (100 mg total) by mouth 3 (three) times daily as needed for cough. Do not take with alcohol or while driving or operating heavy machinery.  May cause drowsiness., Disp: 21 capsule, Rfl: 0   Blood Glucose Monitoring Suppl (ACCU-CHEK GUIDE ME) w/Device KIT, Use to check blood sugars twice a day , before breakfast and before bed., Disp: 1 kit, Rfl: 0   cetirizine  (ZYRTEC ) 5 MG tablet, Take 1 tablet (5 mg total) by mouth daily., Disp: 30 tablet, Rfl: 0   clopidogrel  (PLAVIX ) 75 MG tablet, Take 1 tablet (75 mg total) by mouth daily., Disp: 90 tablet, Rfl: 1    ezetimibe  (ZETIA ) 10 MG tablet, Take 1 tablet (10 mg total)  by mouth daily., Disp: 90 tablet, Rfl: 3   FLUoxetine  (PROZAC ) 40 MG capsule, TAKE 1 CAPSULE(40 MG) BY MOUTH DAILY, Disp: 90 capsule, Rfl: 3   furosemide  (LASIX ) 40 MG tablet, TAKE 1 TABLET BY MOUTH AS NEEDED FOR EDEMA(3 LB WEIGHT GAIN OVERNIGHT), Disp: 30 tablet, Rfl: 5   Glucose Blood (ACCU-CHEK GUIDE TEST VI), by In Vitro route., Disp: , Rfl:    glucose blood (ACCU-CHEK GUIDE TEST) test strip, Use to check blood sugar before breakfast and before bed., Disp: 100 each, Rfl: 11   Glucose Blood (BLOOD GLUCOSE TEST STRIPS) STRP, Use to check glucose twice daily, Disp: 100 strip, Rfl: 11   insulin  degludec (TRESIBA  FLEXTOUCH) 100 UNIT/ML FlexTouch Pen, Inject 12 Units into the skin at bedtime. DX Code E11.8 , Z79.4, Disp: 12 mL, Rfl: 3   Insulin  Pen Needle (BD PEN NEEDLE NANO U/F) 32G X 4 MM MISC, Use to check glucose once daily, Disp: 100 each, Rfl: 6   isosorbide mononitrate (IMDUR) 30 MG 24 hr tablet, Take 1 tablet (30 mg total) by mouth daily., Disp: 90 tablet, Rfl: 3   metFORMIN  (GLUCOPHAGE -XR) 500 MG 24 hr tablet, Take 1 tablet (500 mg total) by mouth daily with breakfast. Resume on 01/18/23, Disp: 90 tablet, Rfl: 3   metoprolol  succinate (TOPROL -XL) 25 MG 24 hr tablet, Take 1 tablet (25 mg total) by mouth daily., Disp: 90 tablet, Rfl: 1   nitroGLYCERIN  (NITROSTAT ) 0.4 MG SL tablet, Place 1 tablet (0.4 mg total) under the tongue every 5 (five) minutes as needed for chest pain., Disp: 25 tablet, Rfl: 3   REPATHA  SURECLICK 140 MG/ML SOAJ, Inject 140 mg into the skin every 14 (fourteen) days., Disp: 2 mL, Rfl: 12   sacubitril -valsartan  (ENTRESTO ) 24-26 MG, TAKE 1 TABLET BY MOUTH TWICE DAILY, Disp: 180 tablet, Rfl: 1   sodium chloride  (OCEAN) 0.65 % SOLN nasal spray, Place 1 spray into both nostrils as needed for congestion., Disp: 30 mL, Rfl: 0   spironolactone  (ALDACTONE ) 25 MG tablet, TAKE 1/2 TABLET(12.5 MG) BY MOUTH DAILY, Disp: 45 tablet,  Rfl: 1   Study - LIBREXIA-ACS - milvexian 25 mg or placebo tablet (PI-Stuckey), Take 1 tablet by mouth 2 (two) times daily. Take 1 tablet by mouth twice a day with or without food. Bring bottle back to every visit. Please contact [facility] for any questions or concerns regarding the medication. For Investigational Use Only., Disp: 420 tablet, Rfl: 0   levothyroxine  (SYNTHROID ) 50 MCG tablet, Take 1 tablet (50 mcg total) by mouth daily before breakfast. (Patient taking differently: Take 50 mcg by mouth at bedtime.), Disp: 90 tablet, Rfl: 3   Vitamin D , Ergocalciferol , (DRISDOL ) 1.25 MG (50000 UNIT) CAPS capsule, Take 1 capsule (50,000 Units total) by mouth every 7 (seven) days. (Patient not taking: Reported on 02/13/2024), Disp: 10 capsule, Rfl: 1   Meds ordered this encounter  Medications   isosorbide mononitrate (IMDUR) 30 MG 24 hr tablet    Sig: Take 1 tablet (30 mg total) by mouth daily.    Dispense:  90 tablet    Refill:  3     Medications Discontinued During This Encounter  Medication Reason   glucose blood test strip Duplicate   mupirocin  ointment (BACTROBAN ) 2 % Completed Course   aspirin  81 MG tablet Completed Course     ASSESSMENT AND PLAN: .      ICD-10-CM   1. Coronary artery disease of native artery of native heart with stable angina pectoris (HCC)  I25.118  isosorbide mononitrate (IMDUR) 30 MG 24 hr tablet    2. Coronary artery disease of bypass graft of native heart with stable angina pectoris (HCC)  I25.708 isosorbide mononitrate (IMDUR) 30 MG 24 hr tablet    3. Essential (primary) hypertension  I10 EKG 12-Lead    4. Hyperlipidemia LDL goal <70  E78.5 Lipid panel      Assessment and Plan Assessment & Plan Coronary artery disease of the native vessel and also coronary artery disease of the bypass graft with chronic stable angina pectoris Intermittent chest pain occurred two to three days ago, described as a burning sensation at night, and resolved with  nitroglycerin . No recurrent episodes since then. Possible angina is suspected due to the response to nitroglycerin .  I will start her on Imdur 30 mg daily.   She is enrolled in a [place] trial for cardiovascular health. Prescribe daily oral nitroglycerin  to be taken every morning to prevent chest pain during active work.  Instruct her to call if chest pain recurs or becomes more frequent. I will discontinue aspirin  to reduce the risk of bleeding, continue Plavix  in view of severe native vessel and bypass graft disease of the coronary arteries.  Hyperlipidemia   She has been on Repatha  since January due to intolerance to statins and is enrolled in a [place] trial for cardiovascular health. No recent blood tests for cholesterol levels have been conducted. Order a blood test for cholesterol levels today.  Primary hypertension Blood pressure is well-controlled on metoprolol  and Entresto  low-dose along with spironolactone .  Continue the same.  Constipation   Bowel movements occur every three days, likely related to medication use and reduced dietary intake. Recommend taking one spoon of Metamucil in a tall glass of water every night before bed to improve bowel regularity.  Signed,  Knox Perl, MD, [facility] 02/13/2024, 7:52 PM [facility] [address] [place], [place] [address] Phone: [phone]. Fax:  [phone]

## 2024-02-13 NOTE — Patient Instructions (Addendum)
 Medication Instructions:  Your physician has recommended you make the following change in your medication:   1) START isosorbide mononitrate (Imdur) 30 mg daily  *If you need a refill on your cardiac medications before your next appointment, please call your pharmacy*  Lab Work: TODAY (go to 1st floor lab): Lipid panel If you have labs (blood work) drawn today and your tests are completely normal, you will receive your results only by: MyChart Message (if you have MyChart) OR A paper copy in the mail If you have any lab test that is abnormal or we need to change your treatment, we will call you to review the results.  Follow-Up: At [facility], you and your health needs are our priority.  As part of our continuing mission to provide you with exceptional heart care, our providers are all part of one team.  This team includes your primary Cardiologist (physician) and Advanced Practice Providers or APPs (Physician Assistants and Nurse Practitioners) who all work together to provide you with the care you need, when you need it.  Your next appointment:   6 month(s)  The format for your next appointment:   In Person  Provider:   Knox Perl, MD{  We recommend signing up for the patient portal called "MyChart".  Sign up information is provided on this After Visit Summary.  MyChart is used to connect with patients for Virtual Visits (Telemedicine).  Patients are able to view lab/test results, encounter notes, upcoming appointments, etc.  Non-urgent messages can be sent to your provider as well.   To learn more about what you can do with MyChart, go to [email or web address].

## 2024-02-14 LAB — LIPID PANEL
Chol/HDL Ratio: 4.1 ratio (ref 0.0–4.4)
Cholesterol, Total: 200 mg/dL — ABNORMAL HIGH (ref 100–199)
HDL: 49 mg/dL (ref >39)
LDL Chol Calc (NIH): 131 mg/dL — ABNORMAL HIGH (ref 0–99)
Triglycerides: 113 mg/dL (ref 0–149)
VLDL Cholesterol Cal: 20 mg/dL (ref 5–40)

## 2024-02-15 ENCOUNTER — Encounter: Payer: Self-pay | Admitting: Cardiology

## 2024-02-15 ENCOUNTER — Telehealth: Payer: Self-pay | Admitting: Cardiology

## 2024-02-15 DIAGNOSIS — E1169 Type 2 diabetes mellitus with other specified complication: Secondary | ICD-10-CM

## 2024-02-15 MED ORDER — EZETIMIBE 10 MG PO TABS: 10.0000 mg | ORAL_TABLET | Freq: Every day | ORAL | 3 refills | Status: AC

## 2024-02-15 NOTE — Telephone Encounter (Signed)
 Spoke with patient and shared lab results per Dr. Berry Bristol:  Please call her and ask if she has been taking Repatha  regularly, her lipids are still very high.  I would go ahead and add Zetia  10 mg daily, she does not tolerate statins please ensure that Zetia  is not a statin. Also advised her to stop and discontinue aspirin .  She needs a repeat lipid profile testing in 2 to 3 months.  She can do this with PCP and have PCP send me the labs if she is seeing him otherwise we can order lipid profile testing.   Rx for Zetia  sent to [facility] pharmacy.  Patient verbalized understanding of the above and expressed appreciation for call.

## 2024-02-15 NOTE — Progress Notes (Signed)
 Please call her and ask if she has been taking Repatha  regularly, her lipids are still very high.  I would go ahead and add Zetia  10 mg daily, she does not tolerate statins please ensure that Zetia  is not a statin.  Also advised her to stop and discontinue aspirin .  She needs a repeat lipid profile testing in 2 to 3 months.  She can do this with PCP and have PCP send me the labs if she is seeing him otherwise we can order lipid profile testing.

## 2024-02-15 NOTE — Telephone Encounter (Signed)
 Patient returned RN call

## 2024-02-20 ENCOUNTER — Ambulatory Visit (HOSPITAL_COMMUNITY)
Admission: RE | Admit: 2024-02-20 | Discharge: 2024-02-20 | Disposition: A | Discharge status: Home / Self Care | Source: Ambulatory Visit | Attending: Nephrology | Admitting: Nephrology

## 2024-02-20 DIAGNOSIS — N1832 Chronic kidney disease, stage 3b: Secondary | ICD-10-CM

## 2024-02-20 DIAGNOSIS — R809 Proteinuria, unspecified: Secondary | ICD-10-CM | POA: Diagnosis not present

## 2024-02-20 DIAGNOSIS — I129 Hypertensive chronic kidney disease with stage 1 through stage 4 chronic kidney disease, or unspecified chronic kidney disease: Secondary | ICD-10-CM

## 2024-02-20 DIAGNOSIS — N281 Cyst of kidney, acquired: Secondary | ICD-10-CM | POA: Diagnosis not present

## 2024-02-20 DIAGNOSIS — N183 Chronic kidney disease, stage 3 unspecified: Secondary | ICD-10-CM | POA: Diagnosis not present

## 2024-02-23 ENCOUNTER — Other Ambulatory Visit (HOSPITAL_COMMUNITY): Payer: Self-pay

## 2024-02-27 DIAGNOSIS — R809 Proteinuria, unspecified: Secondary | ICD-10-CM | POA: Diagnosis not present

## 2024-02-27 DIAGNOSIS — I10 Essential (primary) hypertension: Secondary | ICD-10-CM | POA: Diagnosis not present

## 2024-02-27 DIAGNOSIS — E119 Type 2 diabetes mellitus without complications: Secondary | ICD-10-CM | POA: Diagnosis not present

## 2024-02-27 DIAGNOSIS — N189 Chronic kidney disease, unspecified: Secondary | ICD-10-CM | POA: Diagnosis not present

## 2024-02-27 DIAGNOSIS — D631 Anemia in chronic kidney disease: Secondary | ICD-10-CM | POA: Diagnosis not present

## 2024-02-27 DIAGNOSIS — E211 Secondary hyperparathyroidism, not elsewhere classified: Secondary | ICD-10-CM | POA: Diagnosis not present

## 2024-02-29 ENCOUNTER — Ambulatory Visit (HOSPITAL_COMMUNITY)
Admission: RE | Admit: 2024-02-29 | Discharge: 2024-02-29 | Disposition: A | Discharge status: Home / Self Care | Source: Ambulatory Visit | Attending: Internal Medicine | Admitting: Internal Medicine

## 2024-02-29 ENCOUNTER — Inpatient Hospital Stay (HOSPITAL_COMMUNITY)
Admission: EM | Admit: 2024-02-29 | Discharge: 2024-03-02 | DRG: 125 | Disposition: A | Discharge status: Home / Self Care | Attending: Internal Medicine | Admitting: Internal Medicine

## 2024-02-29 ENCOUNTER — Ambulatory Visit: Payer: Self-pay | Admitting: Internal Medicine

## 2024-02-29 ENCOUNTER — Encounter: Payer: Self-pay | Admitting: Emergency Medicine

## 2024-02-29 ENCOUNTER — Ambulatory Visit: Admission: EM | Admit: 2024-02-29 | Discharge: 2024-02-29 | Disposition: A | Discharge status: Home / Self Care

## 2024-02-29 ENCOUNTER — Other Ambulatory Visit: Payer: Self-pay

## 2024-02-29 ENCOUNTER — Telehealth: Payer: Self-pay | Admitting: Cardiology

## 2024-02-29 ENCOUNTER — Emergency Department (HOSPITAL_COMMUNITY)

## 2024-02-29 ENCOUNTER — Encounter (HOSPITAL_COMMUNITY): Payer: Self-pay

## 2024-02-29 DIAGNOSIS — Z7902 Long term (current) use of antithrombotics/antiplatelets: Secondary | ICD-10-CM | POA: Diagnosis not present

## 2024-02-29 DIAGNOSIS — Z87891 Personal history of nicotine dependence: Secondary | ICD-10-CM | POA: Diagnosis not present

## 2024-02-29 DIAGNOSIS — Z79890 Hormone replacement therapy (postmenopausal): Secondary | ICD-10-CM

## 2024-02-29 DIAGNOSIS — H5340 Unspecified visual field defects: Secondary | ICD-10-CM | POA: Diagnosis not present

## 2024-02-29 DIAGNOSIS — E1122 Type 2 diabetes mellitus with diabetic chronic kidney disease: Secondary | ICD-10-CM | POA: Diagnosis present

## 2024-02-29 DIAGNOSIS — I130 Hypertensive heart and chronic kidney disease with heart failure and stage 1 through stage 4 chronic kidney disease, or unspecified chronic kidney disease: Secondary | ICD-10-CM | POA: Diagnosis present

## 2024-02-29 DIAGNOSIS — H34231 Retinal artery branch occlusion, right eye: Principal | ICD-10-CM | POA: Diagnosis present

## 2024-02-29 DIAGNOSIS — I10 Essential (primary) hypertension: Secondary | ICD-10-CM | POA: Diagnosis present

## 2024-02-29 DIAGNOSIS — H35353 Cystoid macular degeneration, bilateral: Secondary | ICD-10-CM | POA: Diagnosis not present

## 2024-02-29 DIAGNOSIS — H34239 Retinal artery branch occlusion, unspecified eye: Secondary | ICD-10-CM

## 2024-02-29 DIAGNOSIS — E890 Postprocedural hypothyroidism: Secondary | ICD-10-CM | POA: Diagnosis present

## 2024-02-29 DIAGNOSIS — N1832 Chronic kidney disease, stage 3b: Secondary | ICD-10-CM | POA: Diagnosis present

## 2024-02-29 DIAGNOSIS — Z1231 Encounter for screening mammogram for malignant neoplasm of breast: Secondary | ICD-10-CM | POA: Insufficient documentation

## 2024-02-29 DIAGNOSIS — I639 Cerebral infarction, unspecified: Principal | ICD-10-CM | POA: Diagnosis present

## 2024-02-29 DIAGNOSIS — E118 Type 2 diabetes mellitus with unspecified complications: Secondary | ICD-10-CM | POA: Diagnosis present

## 2024-02-29 DIAGNOSIS — I5022 Chronic systolic (congestive) heart failure: Secondary | ICD-10-CM | POA: Diagnosis present

## 2024-02-29 DIAGNOSIS — E041 Nontoxic single thyroid nodule: Secondary | ICD-10-CM | POA: Diagnosis present

## 2024-02-29 DIAGNOSIS — G459 Transient cerebral ischemic attack, unspecified: Secondary | ICD-10-CM

## 2024-02-29 DIAGNOSIS — Z951 Presence of aortocoronary bypass graft: Secondary | ICD-10-CM

## 2024-02-29 DIAGNOSIS — H5461 Unqualified visual loss, right eye, normal vision left eye: Secondary | ICD-10-CM | POA: Diagnosis not present

## 2024-02-29 DIAGNOSIS — Z85820 Personal history of malignant melanoma of skin: Secondary | ICD-10-CM

## 2024-02-29 DIAGNOSIS — Z955 Presence of coronary angioplasty implant and graft: Secondary | ICD-10-CM | POA: Diagnosis not present

## 2024-02-29 DIAGNOSIS — E119 Type 2 diabetes mellitus without complications: Secondary | ICD-10-CM | POA: Diagnosis not present

## 2024-02-29 DIAGNOSIS — Z7982 Long term (current) use of aspirin: Secondary | ICD-10-CM

## 2024-02-29 DIAGNOSIS — F419 Anxiety disorder, unspecified: Secondary | ICD-10-CM | POA: Diagnosis present

## 2024-02-29 DIAGNOSIS — Z85828 Personal history of other malignant neoplasm of skin: Secondary | ICD-10-CM

## 2024-02-29 DIAGNOSIS — G319 Degenerative disease of nervous system, unspecified: Secondary | ICD-10-CM | POA: Diagnosis not present

## 2024-02-29 DIAGNOSIS — Z780 Asymptomatic menopausal state: Secondary | ICD-10-CM | POA: Insufficient documentation

## 2024-02-29 DIAGNOSIS — Z90711 Acquired absence of uterus with remaining cervical stump: Secondary | ICD-10-CM

## 2024-02-29 DIAGNOSIS — I6782 Cerebral ischemia: Secondary | ICD-10-CM | POA: Diagnosis not present

## 2024-02-29 DIAGNOSIS — E871 Hypo-osmolality and hyponatremia: Secondary | ICD-10-CM | POA: Diagnosis present

## 2024-02-29 DIAGNOSIS — I255 Ischemic cardiomyopathy: Secondary | ICD-10-CM | POA: Diagnosis present

## 2024-02-29 DIAGNOSIS — Z808 Family history of malignant neoplasm of other organs or systems: Secondary | ICD-10-CM

## 2024-02-29 DIAGNOSIS — I2510 Atherosclerotic heart disease of native coronary artery without angina pectoris: Secondary | ICD-10-CM | POA: Diagnosis present

## 2024-02-29 DIAGNOSIS — Z8249 Family history of ischemic heart disease and other diseases of the circulatory system: Secondary | ICD-10-CM

## 2024-02-29 DIAGNOSIS — Z794 Long term (current) use of insulin: Secondary | ICD-10-CM | POA: Diagnosis not present

## 2024-02-29 DIAGNOSIS — H53131 Sudden visual loss, right eye: Secondary | ICD-10-CM | POA: Diagnosis not present

## 2024-02-29 DIAGNOSIS — Z7984 Long term (current) use of oral hypoglycemic drugs: Secondary | ICD-10-CM | POA: Diagnosis not present

## 2024-02-29 DIAGNOSIS — I252 Old myocardial infarction: Secondary | ICD-10-CM | POA: Diagnosis not present

## 2024-02-29 DIAGNOSIS — I5043 Acute on chronic combined systolic (congestive) and diastolic (congestive) heart failure: Secondary | ICD-10-CM | POA: Diagnosis present

## 2024-02-29 DIAGNOSIS — Z7985 Long-term (current) use of injectable non-insulin antidiabetic drugs: Secondary | ICD-10-CM

## 2024-02-29 DIAGNOSIS — I6523 Occlusion and stenosis of bilateral carotid arteries: Secondary | ICD-10-CM | POA: Diagnosis not present

## 2024-02-29 DIAGNOSIS — E1151 Type 2 diabetes mellitus with diabetic peripheral angiopathy without gangrene: Secondary | ICD-10-CM | POA: Diagnosis present

## 2024-02-29 DIAGNOSIS — I672 Cerebral atherosclerosis: Secondary | ICD-10-CM | POA: Diagnosis not present

## 2024-02-29 DIAGNOSIS — F32A Depression, unspecified: Secondary | ICD-10-CM | POA: Diagnosis present

## 2024-02-29 DIAGNOSIS — R29818 Other symptoms and signs involving the nervous system: Secondary | ICD-10-CM | POA: Diagnosis not present

## 2024-02-29 DIAGNOSIS — I6501 Occlusion and stenosis of right vertebral artery: Secondary | ICD-10-CM | POA: Diagnosis not present

## 2024-02-29 DIAGNOSIS — E559 Vitamin D deficiency, unspecified: Secondary | ICD-10-CM | POA: Diagnosis present

## 2024-02-29 DIAGNOSIS — E785 Hyperlipidemia, unspecified: Secondary | ICD-10-CM | POA: Diagnosis present

## 2024-02-29 DIAGNOSIS — Z8673 Personal history of transient ischemic attack (TIA), and cerebral infarction without residual deficits: Secondary | ICD-10-CM | POA: Diagnosis not present

## 2024-02-29 DIAGNOSIS — Z79899 Other long term (current) drug therapy: Secondary | ICD-10-CM

## 2024-02-29 DIAGNOSIS — M8589 Other specified disorders of bone density and structure, multiple sites: Secondary | ICD-10-CM | POA: Diagnosis not present

## 2024-02-29 HISTORY — DX: Retinal artery branch occlusion, unspecified eye: H34.239

## 2024-02-29 LAB — CBC WITH DIFFERENTIAL/PLATELET
Abs Immature Granulocytes: 0.02 10*3/uL (ref 0.00–0.07)
Basophils Absolute: 0.1 10*3/uL (ref 0.0–0.1)
Basophils Relative: 1 %
Eosinophils Absolute: 0.2 10*3/uL (ref 0.0–0.5)
Eosinophils Relative: 3 %
HCT: 35.4 % — ABNORMAL LOW (ref 36.0–46.0)
Hemoglobin: 11.7 g/dL — ABNORMAL LOW (ref 12.0–15.0)
Immature Granulocytes: 0 %
Lymphocytes Relative: 27 %
Lymphs Abs: 1.9 10*3/uL (ref 0.7–4.0)
MCH: 30.2 pg (ref 26.0–34.0)
MCHC: 33.1 g/dL (ref 30.0–36.0)
MCV: 91.2 fL (ref 80.0–100.0)
Monocytes Absolute: 0.7 10*3/uL (ref 0.1–1.0)
Monocytes Relative: 10 %
Neutro Abs: 4.3 10*3/uL (ref 1.7–7.7)
Neutrophils Relative %: 59 %
Platelets: 276 10*3/uL (ref 150–400)
RBC: 3.88 MIL/uL (ref 3.87–5.11)
RDW: 14 % (ref 11.5–15.5)
WBC: 7.1 10*3/uL (ref 4.0–10.5)
nRBC: 0 % (ref 0.0–0.2)

## 2024-02-29 LAB — BASIC METABOLIC PANEL WITH GFR
Anion gap: 6 (ref 5–15)
BUN: 26 mg/dL — ABNORMAL HIGH (ref 8–23)
CO2: 24 mmol/L (ref 22–32)
Calcium: 8.3 mg/dL — ABNORMAL LOW (ref 8.9–10.3)
Chloride: 101 mmol/L (ref 98–111)
Creatinine, Ser: 1.13 mg/dL — ABNORMAL HIGH (ref 0.44–1.00)
GFR, Estimated: 49 mL/min — ABNORMAL LOW (ref >60)
Glucose, Bld: 102 mg/dL — ABNORMAL HIGH (ref 70–99)
Potassium: 4 mmol/L (ref 3.5–5.1)
Sodium: 131 mmol/L — ABNORMAL LOW (ref 135–145)

## 2024-02-29 LAB — PROTIME-INR
INR: 1 (ref 0.8–1.2)
Prothrombin Time: 13.6 s (ref 11.4–15.2)

## 2024-02-29 LAB — GLUCOSE, CAPILLARY: Glucose-Capillary: 173 mg/dL — ABNORMAL HIGH (ref 70–99)

## 2024-02-29 LAB — APTT: aPTT: 27 s (ref 24–36)

## 2024-02-29 MED ORDER — ACETAMINOPHEN 160 MG/5ML PO SOLN: 650.0000 mg | ORAL | Status: DC | PRN

## 2024-02-29 MED ORDER — EZETIMIBE 10 MG PO TABS
10.0000 mg | ORAL_TABLET | Freq: Every day | ORAL | Status: DC
  Administered 2024-03-01 (×2): 10 mg via ORAL
  Filled 2024-02-29 (×2): qty 1

## 2024-02-29 MED ORDER — ACETAMINOPHEN 650 MG RE SUPP: 650.0000 mg | RECTAL | Status: DC | PRN

## 2024-02-29 MED ORDER — ACETAMINOPHEN 325 MG PO TABS: 650.0000 mg | ORAL_TABLET | ORAL | Status: DC | PRN

## 2024-02-29 MED ORDER — INSULIN DEGLUDEC 100 UNIT/ML ~~LOC~~ SOPN: 5.0000 [IU] | PEN_INJECTOR | Freq: Every day | SUBCUTANEOUS | Status: DC

## 2024-02-29 MED ORDER — INSULIN ASPART 100 UNIT/ML IJ SOLN
0.0000 [IU] | Freq: Three times a day (TID) | INTRAMUSCULAR | Status: DC
Start: 2024-03-01 — End: 2024-03-02
  Administered 2024-03-01 – 2024-03-02 (×3): 3 [IU] via SUBCUTANEOUS

## 2024-02-29 MED ORDER — STROKE: EARLY STAGES OF RECOVERY BOOK
Freq: Once | Status: AC
  Filled 2024-02-29: qty 1

## 2024-02-29 MED ORDER — CLOPIDOGREL BISULFATE 75 MG PO TABS
75.0000 mg | ORAL_TABLET | Freq: Every day | ORAL | Status: DC
  Administered 2024-03-01 – 2024-03-02 (×2): 75 mg via ORAL
  Filled 2024-02-29 (×2): qty 1

## 2024-02-29 MED ORDER — LEVOTHYROXINE SODIUM 50 MCG PO TABS
50.0000 ug | ORAL_TABLET | Freq: Every day | ORAL | Status: DC
  Administered 2024-03-01 – 2024-03-02 (×2): 50 ug via ORAL
  Filled 2024-02-29 (×2): qty 1

## 2024-02-29 MED ORDER — FLUOXETINE HCL 20 MG PO CAPS
40.0000 mg | ORAL_CAPSULE | Freq: Every day | ORAL | Status: DC
  Administered 2024-03-01 – 2024-03-02 (×2): 40 mg via ORAL
  Filled 2024-02-29 (×2): qty 2

## 2024-02-29 MED ORDER — IOHEXOL 350 MG/ML SOLN
75.0000 mL | Freq: Once | INTRAVENOUS | Status: AC | PRN
  Administered 2024-02-29: 75 mL via INTRAVENOUS

## 2024-02-29 MED ORDER — NITROGLYCERIN 0.4 MG SL SUBL: 0.4000 mg | SUBLINGUAL_TABLET | SUBLINGUAL | 11 refills | Status: AC | PRN

## 2024-02-29 MED ORDER — ASPIRIN 81 MG PO CHEW
324.0000 mg | CHEWABLE_TABLET | Freq: Once | ORAL | Status: AC
  Administered 2024-02-29: 324 mg via ORAL
  Filled 2024-02-29: qty 4

## 2024-02-29 MED ORDER — SENNOSIDES-DOCUSATE SODIUM 8.6-50 MG PO TABS: 1.0000 | ORAL_TABLET | Freq: Every evening | ORAL | Status: DC | PRN

## 2024-02-29 NOTE — ED Provider Notes (Signed)
 [place] [facility]GENC[facility] DEPARTMENT AT [facility] Provider Note   CSN: [id number] Arrival date & time: 02/29/24  1708     History  Chief Complaint  Patient presents with   Loss of Vision    Angela Rose is a 78 y.o. female.  HPI  78 year old female on clopidogrel  with prior cardiac disease presenting with sudden loss of vision, painless in the right eye in the superior visual field.  This does not affect the left eye at all.  There is no associated neurologic symptoms, this occurred at about 1130 this morning approximately 6 hours prior to arrival.  She had gone to the urgent care prior to coming here but was redirected because of the vision loss  Home Medications Prior to Admission medications   Medication Sig Start Date End Date Taking? Authorizing Provider  aspirin  EC 81 MG tablet Take 1 tablet (81 mg total) by mouth daily. Swallow whole. 03/02/24  Yes Ezenduka, Nkeiruka J, MD  Cholecalciferol (VITAMIN D3) 50 MCG (2000 UT) capsule Take 2,000 Units by mouth daily.   Yes [provider]  clopidogrel  (PLAVIX ) 75 MG tablet Take 1 tablet (75 mg total) by mouth daily. 02/09/24  Yes BranchTomas Fountain, MD  ezetimibe  (ZETIA ) 10 MG tablet Take 1 tablet (10 mg total) by mouth daily. 02/15/24  Yes Knox Perl, MD  FLUoxetine  (PROZAC ) 40 MG capsule TAKE 1 CAPSULE(40 MG) BY MOUTH DAILY 02/09/24  Yes Tobi Fortes, MD  furosemide  (LASIX ) 40 MG tablet TAKE 1 TABLET BY MOUTH AS NEEDED FOR EDEMA(3 LB WEIGHT GAIN OVERNIGHT) Patient taking differently: Take 20 mg by mouth daily as needed for fluid or edema (weight gain over 3 lb overnight). 02/10/24  Yes BranchTomas Fountain, MD  insulin  degludec (TRESIBA  FLEXTOUCH) 100 UNIT/ML FlexTouch Pen Inject 12 Units into the skin at bedtime. DX Code E11.8 , Z79.4 01/04/24  Yes Reardon, Arminda Landmark, NP  levothyroxine  (SYNTHROID ) 50 MCG tablet Take 1 tablet (50 mcg total) by mouth daily before breakfast. Patient taking differently: Take 50 mcg by mouth at  bedtime. 12/28/22 02/29/24 Yes Wendel Hals, NP  metFORMIN  (GLUCOPHAGE -XR) 500 MG 24 hr tablet Take 1 tablet (500 mg total) by mouth daily with breakfast. Resume on 01/18/23 01/04/24  Yes Wendel Hals, NP  metoprolol  succinate (TOPROL -XL) 25 MG 24 hr tablet Take 1 tablet (25 mg total) by mouth daily. 02/09/24  Yes BranchTomas Fountain, MD  nitroGLYCERIN  (NITROSTAT ) 0.4 MG SL tablet Place 1 tablet (0.4 mg total) under the tongue every 5 (five) minutes as needed for chest pain. 02/29/24  Yes Knox Perl, MD  REPATHA  SURECLICK 140 MG/ML SOAJ Inject 140 mg into the skin every 14 (fourteen) days. 12/14/23  Yes Tobi Fortes, MD  sacubitril -valsartan  (ENTRESTO ) 24-26 MG TAKE 1 TABLET BY MOUTH TWICE DAILY 01/23/24  Yes Bridgette Campus, MD  spironolactone  (ALDACTONE ) 25 MG tablet TAKE 1/2 TABLET(12.5 MG) BY MOUTH DAILY 01/19/24  Yes Branch, Tomas Fountain, MD  Study - LIBREXIA-ACS - milvexian 25 mg or placebo tablet (PI-Stuckey) Take 1 tablet by mouth 2 (two) times daily. Take 1 tablet by mouth twice a day with or without food. Bring bottle back to every visit. Please contact [place] [facility] for any questions or concerns regarding the medication. For Investigational Use Only. 01/09/24  Yes Kristopher Pheasant, MD  Blood Glucose Monitoring Suppl (ACCU-CHEK GUIDE ME) w/Device KIT Use to check blood sugars twice a day , before breakfast and before bed. 10/17/23   Verlyn Goad,  Whitney J, NP  Glucose Blood (ACCU-CHEK GUIDE TEST VI) by In Vitro route.    [provider]  glucose blood (ACCU-CHEK GUIDE TEST) test strip Use to check blood sugar before breakfast and before bed. 10/17/23   Wendel Hals, NP  Glucose Blood (BLOOD GLUCOSE TEST STRIPS) STRP Use to check glucose twice daily 12/28/22   Wendel Hals, NP  Insulin  Pen Needle (BD PEN NEEDLE NANO U/F) 32G X 4 MM MISC Use to check glucose once daily 12/28/22   Wendel Hals, NP      Allergies    Propoxyphene, Lidocaine -epinephrine  (pf), Other, Statins, Tape,  Xylocaine   [lidocaine  hcl], and Jardiance  [empagliflozin ]    Review of Systems   Review of Systems  All other systems reviewed and are negative.   Physical Exam Updated Vital Signs BP (!) 114/43 (BP Location: Left Arm)   Pulse (!) 59   Temp 97.8 F (36.6 C) (Oral)   Resp 19   Ht 1.676 m (5\' 6" )   Wt 75.4 kg   SpO2 94%   BMI 26.83 kg/m  Physical Exam Vitals and nursing note reviewed.  Constitutional:      General: She is not in acute distress.    Appearance: She is well-developed.  HENT:     Head: Normocephalic and atraumatic.     Mouth/Throat:     Pharynx: No oropharyngeal exudate.  Eyes:     General: No scleral icterus.       Right eye: No discharge.        Left eye: No discharge.     Conjunctiva/sclera: Conjunctivae normal.     Pupils: Pupils are equal, round, and reactive to light.  Neck:     Thyroid : No thyromegaly.     Vascular: No JVD.  Cardiovascular:     Rate and Rhythm: Normal rate and regular rhythm.     Heart sounds: Normal heart sounds. No murmur heard.    No friction rub. No gallop.  Pulmonary:     Effort: Pulmonary effort is normal. No respiratory distress.     Breath sounds: Normal breath sounds. No wheezing or rales.  Abdominal:     General: Bowel sounds are normal. There is no distension.     Palpations: Abdomen is soft. There is no mass.     Tenderness: There is no abdominal tenderness.  Musculoskeletal:        General: No tenderness. Normal range of motion.     Cervical back: Normal range of motion and neck supple.     Right lower leg: No edema.  Lymphadenopathy:     Cervical: No cervical adenopathy.  Skin:    General: Skin is warm and dry.     Findings: No erythema or rash.  Neurological:     Mental Status: She is alert.     Coordination: Coordination normal.     Comments: Speech is clear, cranial nerves III through XII are intact, memory is intact, strength is normal in all 4 extremities including grips and strength at the bilateral  thighs, knees and ankles to extention and flexion, sensation is intact to light touch and pinprick in all 4 extremities. Coordination as tested by finger-nose-finger is normal, no limb ataxia. Normal gait,  The patient is totally normal visual acuity in the left eye, the right eye has normal visual acuity but the peripheral visual field of the right eye is such that she can only see the lower half.  She has her entire peripheral visual  field in the left eye.    Psychiatric:        Behavior: Behavior normal.     ED Results / Procedures / Treatments   Labs (all labs ordered are listed, but only abnormal results are displayed) Labs Reviewed  CBC WITH DIFFERENTIAL/PLATELET - Abnormal; Notable for the following components:      Result Value   Hemoglobin 11.7 (*)    HCT 35.4 (*)    All other components within normal limits  BASIC METABOLIC PANEL WITH GFR - Abnormal; Notable for the following components:   Sodium 131 (*)    Glucose, Bld 102 (*)    BUN 26 (*)    Creatinine, Ser 1.13 (*)    Calcium  8.3 (*)    GFR, Estimated 49 (*)    All other components within normal limits  LIPID PANEL - Abnormal; Notable for the following components:   Triglycerides 224 (*)    VLDL 45 (*)    All other components within normal limits  CBC - Abnormal; Notable for the following components:   Hemoglobin 11.5 (*)    All other components within normal limits  HEMOGLOBIN A1C - Abnormal; Notable for the following components:   Hgb A1c MFr Bld 7.4 (*)    All other components within normal limits  BASIC METABOLIC PANEL WITH GFR - Abnormal; Notable for the following components:   Glucose, Bld 118 (*)    BUN 28 (*)    Creatinine, Ser 1.14 (*)    Calcium  8.3 (*)    GFR, Estimated 49 (*)    All other components within normal limits  GLUCOSE, CAPILLARY - Abnormal; Notable for the following components:   Glucose-Capillary 173 (*)    All other components within normal limits  GLUCOSE, CAPILLARY - Abnormal;  Notable for the following components:   Glucose-Capillary 110 (*)    All other components within normal limits  GLUCOSE, CAPILLARY - Abnormal; Notable for the following components:   Glucose-Capillary 233 (*)    All other components within normal limits  BASIC METABOLIC PANEL WITH GFR - Abnormal; Notable for the following components:   Glucose, Bld 124 (*)    BUN 27 (*)    Creatinine, Ser 1.19 (*)    Calcium  8.1 (*)    GFR, Estimated 46 (*)    All other components within normal limits  GLUCOSE, CAPILLARY - Abnormal; Notable for the following components:   Glucose-Capillary 130 (*)    All other components within normal limits  GLUCOSE, CAPILLARY - Abnormal; Notable for the following components:   Glucose-Capillary 176 (*)    All other components within normal limits  GLUCOSE, CAPILLARY - Abnormal; Notable for the following components:   Glucose-Capillary 216 (*)    All other components within normal limits  PROTIME-INR  APTT  SEDIMENTATION RATE  C-REACTIVE PROTEIN  CBC    EKG EKG Interpretation Date/Time:  Wednesday Feb 29 2024 18:27:36 EDT Ventricular Rate:  54 PR Interval:  355 QRS Duration:  118 QT Interval:  421 QTC Calculation: 399 R Axis:   46  Text Interpretation: Duplicate Confirmed by Shawnee Dellen [phone]) on 03/01/2024 1:05:55 PM  Radiology ECHOCARDIOGRAM COMPLETE Result Date: 03/01/2024    ECHOCARDIOGRAM REPORT   Patient Name:   MERRELL RETTINGER Date of Exam: 03/01/2024 Medical Rec #:  [id number]            Height:       66.0 in Accession #:    [id number]  Weight:       166.2 lb Date of Birth:  02-Aug-1946            BSA:          1.849 m Patient Age:    78 years             BP:           134/52 mmHg Patient Gender: F                    HR:           58 bpm. Exam Location:  [facility] Procedure: 2D Echo, Color Doppler and Cardiac Doppler (Both Spectral and Color            Flow Doppler were utilized during procedure). Indications:    Stroke  History:         Patient has prior history of Echocardiogram examinations, most                 recent 04/20/2023.  Sonographer:    Andrena Bang Referring Phys: [id number] DAWOOD S ELGERGAWY IMPRESSIONS  1. Inferior basal akinesis . Left ventricular ejection fraction, by estimation, is 50 to 55%. The left ventricle has low normal function. The left ventricle demonstrates regional wall motion abnormalities (see scoring diagram/findings for description). The left ventricular internal cavity size was mildly dilated. Left ventricular diastolic parameters were normal.  2. Right ventricular systolic function is normal. The right ventricular size is normal.  3. Cannot r/o small PFO consider bubble study if clinically indicated.  4. The mitral valve is abnormal. Mild mitral valve regurgitation. No evidence of mitral stenosis. Moderate mitral annular calcification.  5. The aortic valve is tricuspid. There is moderate calcification of the aortic valve. There is moderate thickening of the aortic valve. Aortic valve regurgitation is not visualized. Mild aortic valve stenosis.  6. The inferior vena cava is normal in size with greater than 50% respiratory variability, suggesting right atrial pressure of 3 mmHg. FINDINGS  Left Ventricle: Inferior basal akinesis. Left ventricular ejection fraction, by estimation, is 50 to 55%. The left ventricle has low normal function. The left ventricle demonstrates regional wall motion abnormalities. Strain was performed and the global  longitudinal strain is indeterminate. The left ventricular internal cavity size was mildly dilated. There is no left ventricular hypertrophy. Left ventricular diastolic parameters were normal. Right Ventricle: The right ventricular size is normal. No increase in right ventricular wall thickness. Right ventricular systolic function is normal. Left Atrium: Left atrial size was normal in size. Right Atrium: Right atrial size was normal in size. Pericardium: There is no evidence of  pericardial effusion. Mitral Valve: The mitral valve is abnormal. There is mild thickening of the mitral valve leaflet(s). There is mild calcification of the mitral valve leaflet(s). Moderate mitral annular calcification. Mild mitral valve regurgitation. No evidence of mitral  valve stenosis. Tricuspid Valve: The tricuspid valve is normal in structure. Tricuspid valve regurgitation is not demonstrated. No evidence of tricuspid stenosis. Aortic Valve: The aortic valve is tricuspid. There is moderate calcification of the aortic valve. There is moderate thickening of the aortic valve. Aortic valve regurgitation is not visualized. Mild aortic stenosis is present. Aortic valve mean gradient measures 4.0 mmHg. Aortic valve peak gradient measures 7.2 mmHg. Aortic valve area, by VTI measures 1.57 cm. Pulmonic Valve: The pulmonic valve was normal in structure. Pulmonic valve regurgitation is not visualized. No evidence of pulmonic stenosis. Aorta: The aortic root is normal  in size and structure. Venous: The inferior vena cava is normal in size with greater than 50% respiratory variability, suggesting right atrial pressure of 3 mmHg. IAS/Shunts: The interatrial septum was not well visualized. Additional Comments: 3D was performed not requiring image post processing on an independent workstation and was indeterminate.  LEFT VENTRICLE PLAX 2D LVIDd:         5.00 cm      Diastology LVIDs:         3.80 cm      LV e' medial:  7.07 cm/s LV PW:         0.90 cm      LV e' lateral: 6.74 cm/s LV IVS:        1.10 cm LVOT diam:     1.90 cm LV SV:         52 LV SV Index:   28 LVOT Area:     2.84 cm  LV Volumes (MOD) LV vol d, MOD A2C: 126.0 ml LV vol d, MOD A4C: 111.0 ml LV vol s, MOD A2C: 49.9 ml LV vol s, MOD A4C: 49.7 ml LV SV MOD A2C:     76.1 ml LV SV MOD A4C:     111.0 ml LV SV MOD BP:      68.5 ml RIGHT VENTRICLE RV S prime:     7.07 cm/s TAPSE (M-mode): 1.2 cm LEFT ATRIUM             Index LA diam:        3.50 cm 1.89 cm/m LA  Vol (A2C):   55.1 ml 29.81 ml/m LA Vol (A4C):   73.6 ml 39.81 ml/m LA Biplane Vol: 64.0 ml 34.62 ml/m  AORTIC VALVE AV Area (Vmax):    1.67 cm AV Area (Vmean):   1.47 cm AV Area (VTI):     1.57 cm AV Vmax:           134.00 cm/s AV Vmean:          91.500 cm/s AV VTI:            0.333 m AV Peak Grad:      7.2 mmHg AV Mean Grad:      4.0 mmHg LVOT Vmax:         79.10 cm/s LVOT Vmean:        47.300 cm/s LVOT VTI:          0.184 m LVOT/AV VTI ratio: 0.55  AORTA Ao Root diam: 3.30 cm  SHUNTS Systemic VTI:  0.18 m Systemic Diam: 1.90 cm Janelle Mediate MD Electronically signed by Janelle Mediate MD Signature Date/Time: 03/01/2024/12:34:03 PM    Final    MR BRAIN WO CONTRAST Result Date: 03/01/2024 CLINICAL DATA:  Provided history: Stroke. EXAM: MRI HEAD WITHOUT CONTRAST TECHNIQUE: Multiplanar, multiecho pulse sequences of the brain and surrounding structures were obtained without intravenous contrast. COMPARISON:  Non-contrast head CT and CT angiogram head/neck 02/29/2024. FINDINGS: Brain: Generalized cerebral atrophy, mild for age. Multifocal T2 FLAIR hyperintense signal abnormality within the cerebral white matter and pons, nonspecific but compatible with mild chronic small vessel ischemic disease. Several nonspecific punctate chronic microhemorrhages scattered within the supratentorial and infratentorial brain. No cortical encephalomalacia is identified. There is no acute infarct. No evidence of an intracranial mass. No extra-axial fluid collection. No midline shift. Vascular: Maintained flow voids within the proximal large arterial vessels. Skull and upper cervical spine: No focal worrisome marrow lesion. Sinuses/Orbits: No mass or acute finding within the imaged orbits.  Prior bilateral ocular lens replacement. No significant paranasal sinus disease. IMPRESSION: 1.  No acute intracranial finding. 2. Mild chronic small vessel ischemic changes within the cerebral white matter and pons. 3. Several nonspecific punctate  chronic microhemorrhages within the supratentorial and infratentorial brain. Electronically Signed   By: Bascom Lily D.O.   On: 03/01/2024 11:32    Procedures Procedures    Medications Ordered in ED Medications  iohexol  (OMNIPAQUE ) 350 MG/ML injection 75 mL (75 mLs Intravenous Contrast Given 02/29/24 1834)  aspirin  chewable tablet 324 mg (324 mg Oral Given 02/29/24 2206)   stroke: early stages of recovery book ( Does not apply Given 03/01/24 1478)    ED Course/ Medical Decision Making/ A&P                                 Medical Decision Making Amount and/or Complexity of Data Reviewed Labs: ordered. Radiology: ordered.  Risk Prescription drug management. Decision regarding hospitalization.    This patient presents to the ED for concern of sudden painless loss of vision differential diagnosis includes central retinal artery or vein occlusion, optic neuritis, retinal detachment, posterior vitreous hemorrhage    Additional history obtained:  Additional history obtained from the record External records from outside source obtained and reviewed including electronic medical record shows prior office visits over the last couple of months, no recent admissions to the hospital, has been in cardiac rehab because of prior cardiac disease On bedside exam with ophthalmoscope the patient has no signs of abnormal retina, bedside ultrasound reveals no signs of abnormal retina, there is no blood and thunder retina or pale retina, it is symmetrical to the other side, optic disc is clear   Lab Tests:  I Ordered, and personally interpreted labs.  The pertinent results include: CBC unremarkable, metabolic panel with mild hyponatremia, renal function appears to be better than usual, A1c of 7.4   Imaging Studies ordered:  I ordered imaging studies including CT scan of the head and CT angiogram I independently visualized and interpreted imaging which showed plenty of cerebrovascular disease  without obvious infarction or hemorrhage I agree with the radiologist interpretation   Medicines ordered and prescription drug management:  I have reviewed the patients home medicines and have made adjustments as needed   Problem List / ED Course:  I discussed the care of this patient with the neurologist who recommended that the patient be admitted to the hospital for stroke workup.  I did discuss the care of the patient with ophthalmology as well who thought that this was more of a neurologic event like a local emboli to a branch artery to the retina.  Discussed with the hospitalist Dr. Osborne Blazer who has been kind enough to admit   Social Determinants of Health:  Elderly           Final Clinical Impression(s) / ED Diagnoses Final diagnoses:  TIA (transient ischemic attack)  Vision loss of right eye    Rx / DC Orders ED Discharge Orders          Ordered    aspirin  EC 81 MG tablet  Daily        03/02/24 1051    Increase activity slowly        03/02/24 1051    Diet - low sodium heart healthy        03/02/24 1051    Ambulatory referral to Neurology  Comments: An appointment is requested in approximately: 8 weeks   03/01/24 1331              Early Glisson, MD 03/03/24 [phone]

## 2024-02-29 NOTE — ED Provider Notes (Signed)
 [facility]    CSN: [id number] Arrival date & time: 02/29/24  1647      History   Chief Complaint No chief complaint on file.   HPI Angela Rose is a 78 y.o. female.   HPI  Past Medical History:  Diagnosis Date   Anxiety 07/10/2014   Basal cell carcinoma    Cancer (HCC) 12/13/2011   melanoma face   CHF (congestive heart failure), NYHA class II, acute on chronic, combined (HCC) 02-Feb-2023   Chronic kidney disease (CKD), stage III (moderate) (HCC) 02/17/2022   Chronic sinusitis 07/02/2022   Depression 07/10/2014   Diabetes mellitus without complication (HCC) 08/21/2014   Fracture of tibial plateau 12/31/2015   Hyperlipidemia 08/14/2015   Hypertension 09/03/2014   Hypothyroidism, postablative 02/08/2014   Melanoma (HCC)    Myocardial infarction (HCC) 1993   Tick bites 10/29/2014   Vitamin D  deficiency 12/08/2022    Patient Active Problem List   Diagnosis Date Noted   Hyperkalemia 10/27/2023   SCC (squamous cell carcinoma), hand, left 10/27/2023   Cystitis 06/14/2023   Need for influenza vaccination 06/14/2023   Vitamin D  insufficiency 03/30/2023   CHF (congestive heart failure), NYHA class II, acute on chronic, combined (HCC) 02-02-2023   Ischemic cardiomyopathy February 02, 2023   At risk for sudden cardiac death 02-02-23   Acute ST elevation myocardial infarction (STEMI) of inferior wall (HCC) 01/13/2023   Hx of CABG 01/13/2023   Coronary artery disease involving native coronary artery of native heart with unstable angina pectoris (HCC) 01/13/2023   Coronary artery disease involving coronary bypass graft of native heart with angina pectoris (HCC) 01/13/2023   Need for immunization against influenza 09/07/2022   Chronic sinusitis 07/02/2022   Headache 07/02/2022   Chronic kidney disease, stage 3b (HCC) 05/06/2022   Fatigue 02/10/2022   Allergies 11/09/2021   Constipation 11/09/2021   Cataract, nuclear sclerotic, both eyes 11/08/2018   OAB  (overactive bladder) 08/11/2018   Tinnitus aurium, bilateral 08/11/2018   Renal cyst, right 05/01/2018   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   Other tear of medial meniscus, current injury, left knee, initial encounter 12/31/2015   Type II diabetes mellitus with complication (HCC) 08/14/2015   Hyperlipidemia 08/14/2015   Coronary artery disease involving native coronary artery of native heart without angina pectoris 08/14/2015   Basal cell carcinoma 08/11/2015   History of melanoma 08/11/2015   Major depression in partial remission (HCC) 08/08/2015   Non-proliferative diabetic retinopathy, both eyes (HCC) 08/08/2015   History of colon polyps 08/08/2015   Hypothyroidism, postablative 08/08/2015   Carotid artery plaque 09/03/2014   Essential (primary) hypertension 09/03/2014   APC (atrial premature contractions) 09/03/2014    Past Surgical History:  Procedure Laterality Date   ABDOMINAL HYSTERECTOMY     APPENDECTOMY     BASAL CELL CARCINOMA EXCISION     BREAST BIOPSY Right    needle bx-neg   CATARACT EXTRACTION, BILATERAL Bilateral 11/23/2018   CORONARY ARTERY BYPASS GRAFT  1996   x 3   CORONARY/GRAFT ACUTE MI REVASCULARIZATION N/A 01/13/2023   Procedure: Coronary/Graft Acute MI Revascularization;  Surgeon: Arleen Lacer, MD;  Location: [facility] INVASIVE CV LAB;  Service: Cardiovascular;  Laterality: N/A;   LEFT HEART CATH AND CORONARY ANGIOGRAPHY N/A 01/13/2023   Procedure: LEFT HEART CATH AND CORONARY ANGIOGRAPHY;  Surgeon: Arleen Lacer, MD;  Location: [facility] INVASIVE CV LAB;  Service: Cardiovascular;  Laterality: N/A;   MELANOMA EXCISION     PARTIAL HYSTERECTOMY  1976   ovaries remain  OB History   No obstetric history on file.      Home Medications    Prior to Admission medications   Medication Sig Start Date End Date Taking? Authorizing Provider  albuterol  (VENTOLIN  HFA) 108 (90 Base) MCG/ACT inhaler INHALE 1 TO 2 PUFFS INTO THE LUNGS EVERY 6 HOURS AS NEEDED FOR WHEEZING OR  SHORTNESS OF BREATH 02/13/24   Tobi Fortes, MD  azelastine  (ASTELIN ) 0.1 % nasal spray Place 1 spray into both nostrils 2 (two) times daily. Use in each nostril as directed 07/02/22   Zarwolo, Gloria, FNP  b complex vitamins capsule Take 1 capsule by mouth daily.    [provider]  benzonatate  (TESSALON ) 100 MG capsule Take 1 capsule (100 mg total) by mouth 3 (three) times daily as needed for cough. Do not take with alcohol or while driving or operating heavy machinery.  May cause drowsiness. 01/17/24   Wilhemena Harbour, NP  Blood Glucose Monitoring Suppl (ACCU-CHEK GUIDE ME) w/Device KIT Use to check blood sugars twice a day , before breakfast and before bed. 10/17/23   Wendel Hals, NP  cetirizine  (ZYRTEC ) 5 MG tablet Take 1 tablet (5 mg total) by mouth daily. 07/02/22   Zarwolo, Gloria, FNP  clopidogrel  (PLAVIX ) 75 MG tablet Take 1 tablet (75 mg total) by mouth daily. 02/09/24   Bridgette Campus, MD  ezetimibe  (ZETIA ) 10 MG tablet Take 1 tablet (10 mg total) by mouth daily. 02/15/24   Knox Perl, MD  FLUoxetine  (PROZAC ) 40 MG capsule TAKE 1 CAPSULE(40 MG) BY MOUTH DAILY 02/09/24   Tobi Fortes, MD  furosemide  (LASIX ) 40 MG tablet TAKE 1 TABLET BY MOUTH AS NEEDED FOR EDEMA(3 LB WEIGHT GAIN OVERNIGHT) 02/10/24   Bridgette Campus, MD  Glucose Blood (ACCU-CHEK GUIDE TEST VI) by In Vitro route.    [provider]  glucose blood (ACCU-CHEK GUIDE TEST) test strip Use to check blood sugar before breakfast and before bed. 10/17/23   Wendel Hals, NP  Glucose Blood (BLOOD GLUCOSE TEST STRIPS) STRP Use to check glucose twice daily 12/28/22   Wendel Hals, NP  insulin  degludec (TRESIBA  FLEXTOUCH) 100 UNIT/ML FlexTouch Pen Inject 12 Units into the skin at bedtime. DX Code E11.8 , Z79.4 01/04/24   Wendel Hals, NP  Insulin  Pen Needle (BD PEN NEEDLE NANO U/F) 32G X 4 MM MISC Use to check glucose once daily 12/28/22   Wendel Hals, NP  isosorbide  mononitrate (IMDUR ) 30 MG 24 hr  tablet Take 1 tablet (30 mg total) by mouth daily. 02/13/24 05/13/24  Knox Perl, MD  levothyroxine  (SYNTHROID ) 50 MCG tablet Take 1 tablet (50 mcg total) by mouth daily before breakfast. Patient taking differently: Take 50 mcg by mouth at bedtime. 12/28/22 01/18/24  Wendel Hals, NP  metFORMIN  (GLUCOPHAGE -XR) 500 MG 24 hr tablet Take 1 tablet (500 mg total) by mouth daily with breakfast. Resume on 01/18/23 01/04/24   Wendel Hals, NP  metoprolol  succinate (TOPROL -XL) 25 MG 24 hr tablet Take 1 tablet (25 mg total) by mouth daily. 02/09/24   Bridgette Campus, MD  nitroGLYCERIN  (NITROSTAT ) 0.4 MG SL tablet Place 1 tablet (0.4 mg total) under the tongue every 5 (five) minutes as needed for chest pain. 02/29/24   Knox Perl, MD  REPATHA  SURECLICK 140 MG/ML SOAJ Inject 140 mg into the skin every 14 (fourteen) days. 12/14/23   Tobi Fortes, MD  sacubitril -valsartan  (ENTRESTO ) 24-26 MG TAKE 1 TABLET BY MOUTH TWICE DAILY  01/23/24   Bridgette Campus, MD  sodium chloride  (OCEAN) 0.65 % SOLN nasal spray Place 1 spray into both nostrils as needed for congestion. 11/09/21   Paseda, Folashade R, FNP  spironolactone  (ALDACTONE ) 25 MG tablet TAKE 1/2 TABLET(12.5 MG) BY MOUTH DAILY 01/19/24   Bridgette Campus, MD  Study - LIBREXIA-ACS - milvexian 25 mg or placebo tablet (PI-Stuckey) Take 1 tablet by mouth 2 (two) times daily. Take 1 tablet by mouth twice a day with or without food. Bring bottle back to every visit. Please contact [facility] for any questions or concerns regarding the medication. For Investigational Use Only. 01/09/24   Kristopher Pheasant, MD  Vitamin D , Ergocalciferol , (DRISDOL ) 1.25 MG (50000 UNIT) CAPS capsule Take 1 capsule (50,000 Units total) by mouth every 7 (seven) days. Patient not taking: Reported on 02/13/2024 12/08/22   Zarwolo, Gloria, FNP    Family History Family History  Problem Relation Age of Onset   Skin cancer Mother    Squamous cell carcinoma Mother    Basal cell carcinoma Mother     Heart disease Father    CAD Father    Breast cancer Neg Hx     Social History Social History   Tobacco Use   Smoking status: Former    Current packs/day: 0.00    Types: Cigarettes    Quit date: 06/11/1982    Years since quitting: 41.7   Smokeless tobacco: Never   Tobacco comments:    smoking cessation materials not required  Vaping Use   Vaping status: Never Used  Substance Use Topics   Alcohol use: Yes    Alcohol/week: 0.0 standard drinks of alcohol    Comment: rare 1 wine glass of wine   Drug use: No     Allergies   Propoxyphene, Lidocaine -epinephrine  (pf), Other, Tape, Xylocaine   [lidocaine  hcl], Statins, and Jardiance  [empagliflozin ]   Review of Systems Review of Systems   Physical Exam Triage Vital Signs ED Triage Vitals  Encounter Vitals Group     BP 02/29/24 1652 (!) 148/74     Systolic BP Percentile --      Diastolic BP Percentile --      Pulse Rate 02/29/24 1652 60     Resp 02/29/24 1652 18     Temp 02/29/24 1652 98.3 F (36.8 C)     Temp Source 02/29/24 1652 Oral     SpO2 02/29/24 1652 96 %     Weight --      Height --      Head Circumference --      Peak Flow --      Pain Score 02/29/24 1653 0     Pain Loc --      Pain Education --      Exclude from Growth Chart --    No data found.  Updated Vital Signs BP (!) 148/74 (BP Location: Right Arm)   Pulse 60   Temp 98.3 F (36.8 C) (Oral)   Resp 18   SpO2 96%   Visual Acuity Right Eye Distance:   Left Eye Distance:   Bilateral Distance:    Right Eye Near:   Left Eye Near:    Bilateral Near:     Physical Exam Vitals and nursing note reviewed.  Constitutional:      General: She is not in acute distress.    Appearance: Normal appearance.  HENT:     Head: Normocephalic.  Pulmonary:     Effort: Pulmonary effort is normal.  Musculoskeletal:     Cervical back: Normal range of motion.  Skin:    General: Skin is warm and dry.  Neurological:     General: No focal deficit present.      Mental Status: She is alert and oriented to person, place, and time.  Psychiatric:        Mood and Affect: Mood normal.        Behavior: Behavior normal.      UC Treatments / Results  Labs (all labs ordered are listed, but only abnormal results are displayed) Labs Reviewed - No data to display  EKG   Radiology   Procedures Procedures (including critical care time)  Medications Ordered in UC Medications - No data to display  Initial Impression / Assessment and Plan / UC Course  I have reviewed the triage vital signs and the nursing notes.  Pertinent labs & imaging results that were available during my care of the patient were reviewed by me and considered in my medical decision making (see chart for details).  Patient presents with sudden visual field defects that started today. Patient states she can only see half of her visual field. Denies slurred speech, double vision, headache, numbness, tingling or LE weakness. Patient was advised that given the sudden onset of her symptoms, it is recommended that she go to the emergency department immediately for further evaluation.  Patient and family member were in agreement with this plan of care and verbalized understanding.  All questions were answered.  Patient discharged to the emergency department.  Patient's vital signs are stable at discharge, she is able to travel via private vehicle to the emergency department.  Patient ambulatory at discharge.  Final Clinical Impressions(s) / UC Diagnoses   Final diagnoses:  Sudden loss of vision, right  Visual field defect of right eye   Discharge Instructions      Go to the emergency department for further evaluation.   ED Prescriptions   None    PDMP not reviewed this encounter.   Hardy Lia, NP 02/29/24 1722

## 2024-02-29 NOTE — Telephone Encounter (Signed)
*  STAT* If patient is at the pharmacy, call can be transferred to refill team.   1. Which medications need to be refilled? (please list name of each medication and dose if known) new prescription for Nitroglycerin    2. Would you like to learn more about the convenience, safety, & potential cost savings by using the [facility]?     3. Are you open to using the [facility] (Type [facility].   4. Which pharmacy/location (including street and city if local pharmacy) is medication to be sent to?[facility] [address] [place]   5. Do they need a 30 day or 90 day supply?

## 2024-02-29 NOTE — Discharge Instructions (Addendum)
 Go to the emergency department for further evaluation.

## 2024-02-29 NOTE — H&P (Signed)
 [facility] H&P   Patient Demographics:    Angela Rose, is a 78 y.o. female  MRN: [id number]   DOB - [birth year]  Admit Date - 02/29/2024  Outpatient Primary MD for the patient is Kermit Ped Heath Litten, MD  Referring MD/NP/PA: Dr. Annabell Key  Outpatient Specialists: Cardiology [facility]  Patient coming from: Home  Chief Complaint  Patient presents with   Loss of Vision      HPI:    Angela Rose  is a 78 y.o. female,  female with a hx of MI in 1993 with coronary angioplasty and stenting, CABG 1996, HTN, HLD, PAD, former smoker, carotid atherosclerosis.  Ischemic cardiomyopathy, chronic systolic CHF Patient is currently on Libraxa ACS study by cardiology Milvexian 25 mg versus placebo. - Presents to ED secondary to complaints of loss of vision, patient reports she took her meds this morning, reports she had painless loss of vision in the right eye, in the superior visual field, quit around 1130 this a.m., 6 hours prior to arrival to ED, initially she went to urgent care for which she was instructed to come to ED for her vision loss, she denies any other focal deficits, no tingling, no numbness, no altered mental status.  Does appear patient was on both aspirin  and Plavix , as well she is currently on Librexa , so aspirin  has been discussed during recent cardiology visit creased her risk of bleeding. - In ED CTA head and done showing significant small vessel disease, but no LVO, CT head with no intracranial abnormality, only significant for calcified left lobe nodule, [facility] consulted to admit.      Review of systems:   A full 10 point Review of Systems was done, except as stated above, all other Review of Systems were negative.   With Past History of the following :    Past Medical History:  Diagnosis Date   Anxiety 07/10/2014   Basal cell carcinoma    Cancer (HCC)  12/13/2011   melanoma face   CHF (congestive heart failure), NYHA class II, acute on chronic, combined (HCC) 01/14/2023   Chronic kidney disease (CKD), stage III (moderate) (HCC) 02/17/2022   Chronic sinusitis 07/02/2022   Depression 07/10/2014   Diabetes mellitus without complication (HCC) 08/21/2014   Fracture of tibial plateau 12/31/2015   Hyperlipidemia 08/14/2015   Hypertension 09/03/2014   Hypothyroidism, postablative 02/08/2014   Melanoma (HCC)    Myocardial infarction (HCC) 1993   Tick bites 10/29/2014   Vitamin D  deficiency 12/08/2022      Past Surgical History:  Procedure Laterality Date   ABDOMINAL HYSTERECTOMY     APPENDECTOMY     BASAL CELL CARCINOMA EXCISION     BREAST BIOPSY Right    needle bx-neg   CATARACT EXTRACTION, BILATERAL Bilateral 11/23/2018   CORONARY ARTERY BYPASS GRAFT  1996   x 3   CORONARY/GRAFT ACUTE MI REVASCULARIZATION N/A 01/13/2023  Procedure: Coronary/Graft Acute MI Revascularization;  Surgeon: Arleen Lacer, MD;  Location: [facility] INVASIVE CV LAB;  Service: Cardiovascular;  Laterality: N/A;   LEFT HEART CATH AND CORONARY ANGIOGRAPHY N/A 01/13/2023   Procedure: LEFT HEART CATH AND CORONARY ANGIOGRAPHY;  Surgeon: Arleen Lacer, MD;  Location: [facility] INVASIVE CV LAB;  Service: Cardiovascular;  Laterality: N/A;   MELANOMA EXCISION     PARTIAL HYSTERECTOMY  1976   ovaries remain      Social History:     Social History   Tobacco Use   Smoking status: Former    Current packs/day: 0.00    Types: Cigarettes    Quit date: 06/11/1982    Years since quitting: 41.7   Smokeless tobacco: Never   Tobacco comments:    smoking cessation materials not required  Substance Use Topics   Alcohol use: Yes    Alcohol/week: 0.0 standard drinks of alcohol    Comment: rare 1 wine glass of wine      Family History :     Family History  Problem Relation Age of Onset   Skin cancer Mother    Squamous cell carcinoma Mother    Basal cell carcinoma Mother     Heart disease Father    CAD Father    Breast cancer Neg Hx      Home Medications:   Prior to Admission medications   Medication Sig Start Date End Date Taking? Authorizing Provider  Cholecalciferol (VITAMIN D3) 50 MCG (2000 UT) capsule Take 2,000 Units by mouth daily.   Yes [provider]  clopidogrel  (PLAVIX ) 75 MG tablet Take 1 tablet (75 mg total) by mouth daily. 02/09/24  Yes BranchTomas Fountain, MD  ezetimibe  (ZETIA ) 10 MG tablet Take 1 tablet (10 mg total) by mouth daily. 02/15/24  Yes Knox Perl, MD  FLUoxetine  (PROZAC ) 40 MG capsule TAKE 1 CAPSULE(40 MG) BY MOUTH DAILY 02/09/24  Yes Tobi Fortes, MD  furosemide  (LASIX ) 40 MG tablet TAKE 1 TABLET BY MOUTH AS NEEDED FOR EDEMA(3 LB WEIGHT GAIN OVERNIGHT) Patient taking differently: Take 20 mg by mouth daily as needed for fluid or edema (weight gain over 3 lb overnight). 02/10/24  Yes BranchTomas Fountain, MD  insulin  degludec (TRESIBA  FLEXTOUCH) 100 UNIT/ML FlexTouch Pen Inject 12 Units into the skin at bedtime. DX Code E11.8 , Z79.4 01/04/24  Yes Reardon, Arminda Landmark, NP  levothyroxine  (SYNTHROID ) 50 MCG tablet Take 1 tablet (50 mcg total) by mouth daily before breakfast. Patient taking differently: Take 50 mcg by mouth at bedtime. 12/28/22 02/29/24 Yes Wendel Hals, NP  metFORMIN  (GLUCOPHAGE -XR) 500 MG 24 hr tablet Take 1 tablet (500 mg total) by mouth daily with breakfast. Resume on 01/18/23 01/04/24  Yes Wendel Hals, NP  metoprolol  succinate (TOPROL -XL) 25 MG 24 hr tablet Take 1 tablet (25 mg total) by mouth daily. 02/09/24  Yes BranchTomas Fountain, MD  nitroGLYCERIN  (NITROSTAT ) 0.4 MG SL tablet Place 1 tablet (0.4 mg total) under the tongue every 5 (five) minutes as needed for chest pain. 02/29/24  Yes Knox Perl, MD  REPATHA  SURECLICK 140 MG/ML SOAJ Inject 140 mg into the skin every 14 (fourteen) days. 12/14/23  Yes Tobi Fortes, MD  sacubitril -valsartan  (ENTRESTO ) 24-26 MG TAKE 1 TABLET BY MOUTH TWICE DAILY 01/23/24  Yes Bridgette Campus, MD   spironolactone  (ALDACTONE ) 25 MG tablet TAKE 1/2 TABLET(12.5 MG) BY MOUTH DAILY 01/19/24  Yes Branch, Tomas Fountain, MD  Study - LIBREXIA-ACS - milvexian 25 mg or  placebo tablet (PI-Stuckey) Take 1 tablet by mouth 2 (two) times daily. Take 1 tablet by mouth twice a day with or without food. Bring bottle back to every visit. Please contact [place] [facility] for any questions or concerns regarding the medication. For Investigational Use Only. 01/09/24  Yes Kristopher Pheasant, MD  Blood Glucose Monitoring Suppl (ACCU-CHEK GUIDE ME) w/Device KIT Use to check blood sugars twice a day , before breakfast and before bed. 10/17/23   Wendel Hals, NP  Glucose Blood (ACCU-CHEK GUIDE TEST VI) by In Vitro route.    [provider]  glucose blood (ACCU-CHEK GUIDE TEST) test strip Use to check blood sugar before breakfast and before bed. 10/17/23   Wendel Hals, NP  Glucose Blood (BLOOD GLUCOSE TEST STRIPS) STRP Use to check glucose twice daily 12/28/22   Wendel Hals, NP  Insulin  Pen Needle (BD PEN NEEDLE NANO U/F) 32G X 4 MM MISC Use to check glucose once daily 12/28/22   Wendel Hals, NP     Allergies:     Allergies  Allergen Reactions   Propoxyphene Nausea And Vomiting   Lidocaine -Epinephrine  (Pf) Palpitations and Other (See Comments)    Chest pain   Other Other (See Comments)    Sutures - in her hand - very irritated   Statins Other (See Comments)    Muscle pain and weakness in legs   Tape Rash    Bandaids   Xylocaine   [Lidocaine  Hcl] Palpitations and Other (See Comments)    Chest pain   Jardiance  [Empagliflozin ] Other (See Comments)    Recurrent vaginal infections     Physical Exam:   Vitals  Blood pressure 128/66, pulse (!) 48, temperature 97.7 F (36.5 C), temperature source Oral, resp. rate 18, SpO2 95%.   1. General Well-built female, laying in bed, no apparent distress  2. Normal affect and insight, Not Suicidal or Homicidal, Awake Alert, Oriented X 3.  3. No  F.N deficits, ALL C.Nerves Intact, Strength 5/5 all 4 extremities, Sensation intact all 4 extremities, Plantars down going.  4. Ears and Eyes appear Normal, Conjunctivae clear, PERRLA. Moist Oral Mucosa.  Right eye visual field defect, only 10 cm in the lower field.  5. Supple Neck, No JVD, No cervical lymphadenopathy appriciated, No Carotid Bruits.  6. Symmetrical Chest wall movement, Good air movement bilaterally, CTAB.  7. RRR, No Gallops, Rubs or Murmurs, No Parasternal Heave.  8. Positive Bowel Sounds, Abdomen Soft, No tenderness, No organomegaly appriciated,No rebound -guarding or rigidity.  9.  No Cyanosis, Normal Skin Turgor, No Skin Rash or Bruise.  10. Good muscle tone,  joints appear normal , no effusions, Normal ROM.     Data Review:    CBC Recent Labs  Lab 02/29/24 1852  WBC 7.1  HGB 11.7*  HCT 35.4*  PLT 276  MCV 91.2  MCH 30.2  MCHC 33.1  RDW 14.0  LYMPHSABS 1.9  MONOABS 0.7  EOSABS 0.2  BASOSABS 0.1   ------------------------------------------------------------------------------------------------------------------  Chemistries  Recent Labs  Lab 02/29/24 1852  NA 131*  K 4.0  CL 101  CO2 24  GLUCOSE 102*  BUN 26*  CREATININE 1.13*  CALCIUM  8.3*   ------------------------------------------------------------------------------------------------------------------ CrCl cannot be calculated (Unknown ideal weight.). ------------------------------------------------------------------------------------------------------------------ No results for input(s): "TSH", "T4TOTAL", "T3FREE", "THYROIDAB" in the last 72 hours.  Invalid input(s): "FREET3"  Coagulation profile Recent Labs  Lab 02/29/24 1852  INR 1.0   ------------------------------------------------------------------------------------------------------------------- No results for input(s): "DDIMER" in the last 72  hours. -------------------------------------------------------------------------------------------------------------------  Cardiac Enzymes No results for input(s): "CKMB", "TROPONINI", "MYOGLOBIN" in the last 168 hours.  Invalid input(s): "CK" ------------------------------------------------------------------------------------------------------------------ No results found for: "BNP"   ---------------------------------------------------------------------------------------------------------------  Urinalysis    Component Value Date/Time   COLORURINE YELLOW 11/02/2019 0947   APPEARANCEUR Cloudy (A) 12/27/2023 1200   LABSPEC 1.015 11/02/2019 0947   PHURINE 5.5 11/02/2019 0947   GLUCOSEU 3+ (A) 12/27/2023 1200   HGBUR NEGATIVE 11/02/2019 0947   BILIRUBINUR Negative 12/27/2023 1200   KETONESUR NEGATIVE 11/02/2019 0947   PROTEINUR Trace 12/27/2023 1200   PROTEINUR NEGATIVE 11/02/2019 0947   UROBILINOGEN 0.2 12/25/2019 1028   NITRITE Positive (A) 12/27/2023 1200   NITRITE NEGATIVE 11/02/2019 0947   LEUKOCYTESUR 2+ (A) 12/27/2023 1200   LEUKOCYTESUR MODERATE (A) 11/02/2019 0947    ----------------------------------------------------------------------------------------------------------------   Imaging Results:    CT ANGIO HEAD NECK W WO CM Result Date: 02/29/2024 CLINICAL DATA:  Neuro deficit, concern for stroke, sudden loss of vision in right eye this morning. EXAM: CT ANGIOGRAPHY HEAD AND NECK WITH AND WITHOUT CONTRAST TECHNIQUE: Multidetector CT imaging of the head and neck was performed using the standard protocol during bolus administration of intravenous contrast. Multiplanar CT image reconstructions and MIPs were obtained to evaluate the vascular anatomy. Carotid stenosis measurements (when applicable) are obtained utilizing NASCET criteria, using the distal internal carotid diameter as the denominator. RADIATION DOSE REDUCTION: This exam was performed according to the  departmental dose-optimization program which includes automated exposure control, adjustment of the mA and/or kV according to patient size and/or use of iterative reconstruction technique. CONTRAST:  75mL OMNIPAQUE  IOHEXOL  350 MG/ML SOLN COMPARISON:  CT head 07/13/2022. FINDINGS: CT HEAD FINDINGS Brain: No acute intracranial hemorrhage. No CT evidence of acute infarct. No edema, mass effect, or midline shift. The basilar cisterns are patent. Ventricles: Ventricles are normal in size and configuration. Vascular: No hyperdense vessel. Intracranial atherosclerotic calcifications noted. Skull: No acute or aggressive finding. Sinuses/orbits: The visualized paranasal sinuses are clear. Orbits are symmetric. Other: Mastoid air cells are clear. CTA NECK FINDINGS Aortic arch: Standard configuration of the aortic arch. Imaged portion shows no evidence of aneurysm or dissection. Mild atherosclerosis. No significant stenosis of the major arch vessel origins. Pulmonary arteries: As permitted by contrast timing, there are no filling defects in the visualized pulmonary arteries. Subclavian arteries: The subclavian arteries are patent bilaterally. Right carotid system: Patent. Mild atherosclerosis along the common carotid artery. Mild atherosclerosis at the carotid bifurcation without hemodynamically significant stenosis. No evidence of dissection. Left carotid system: No evidence of dissection, stenosis (50% or greater), or occlusion. Mild atherosclerosis along the common carotid artery. Vertebral arteries: Codominant. Limited evaluation of the bilateral V1 segments due to adjacent dense venous contrast. Atherosclerosis of the bilateral V4 segments. Focal severe stenosis of the right V4 segment. Mild stenosis of the left V4 segment. No evidence of dissection. Skeleton: No acute findings. Degenerative changes in the cervical spine. Partially visualized sequelae of median sternotomy. Other neck: The visualized airway is patent. No  cervical lymphadenopathy. Partially calcified nodule in the left thyroid  lobe which measures approximately 2 cm in diameter. Upper chest: Visualized lung apices are clear. Review of the MIP images confirms the above findings CTA HEAD FINDINGS ANTERIOR CIRCULATION: The intracranial ICAs are patent bilaterally. Atherosclerosis of the bilateral carotid siphons. Moderate stenosis of the bilateral supraclinoid ICAs. No proximal occlusion, aneurysm, or vascular malformation. MCAs: The right M1 segment is patent. Focal severe stenosis of the mid right M1 segment without evidence of occlusion. ACAs: Patent bilaterally. Moderate stenosis  of the proximal left A1 segment. Additional severe stenosis of the A3 segment left ACA. POSTERIOR CIRCULATION: No significant stenosis, proximal occlusion, aneurysm, or vascular malformation. PCAs: Patent bilaterally. Moderate stenosis of the P2 a segment right PCA. Additional moderate stenosis of the P2 a segment left PCA. Pcomm: The posterior communicating arteries are visualized bilaterally. SCAs: The superior cerebellar arteries are patent bilaterally. Basilar artery: Patent AICAs: Patent PICAs: Not well visualized. Vertebral arteries: As above. Venous sinuses: As permitted by contrast timing, patent. Anatomic variants: None Review of the MIP images confirms the above findings IMPRESSION: No large vessel occlusion. Multiple intracranial vascular stenoses as above. Severe stenosis of the mid M1 segment right MCA. Additional severe stenosis of the A3 segment left ACA. Moderate stenosis of the P2 segments of the bilateral PCAs. Atherosclerosis of the V4 segment right vertebral artery resulting in severe stenosis. Atherosclerosis at the carotid bifurcations without high-grade stenosis. No CT evidence of acute intracranial abnormality. Partially calcified nodule in the left thyroid  lobe likely measuring around 2 cm in diameter. Recommend correlation with nonemergent thyroid  ultrasound. Aortic  Atherosclerosis (ICD10-I70.0). Electronically Signed   By: Denny Flack M.D.   On: 02/29/2024 19:17   DG Bone Density Result Date: 02/29/2024 EXAM: DUAL X-RAY ABSORPTIOMETRY (DXA) FOR BONE MINERAL DENSITY 02/29/2024 11:23 am CLINICAL DATA:  78 year old Female Postmenopausal. Post menopausal estrogen deficient History of fragility fracture. Patient is or has been on bone building therapies. TECHNIQUE: An axial (e.g., hips, spine) and/or appendicular (e.g., radius) exam was performed, as appropriate, using [employer] Psychologist, sport and exercise at [facility]. Images are obtained for bone mineral density measurement and are not obtained for diagnostic purposes. [id number] Exclusions: Lumbar spine due to advanced degenerative changes COMPARISON:  None. New baseline. FINDINGS: Scan quality: Good. LEFT FEMORAL NECK: BMD (in g/cm2): 0.842 T-score: -1.4 Z-score: 0.8 LEFT TOTAL HIP: BMD (in g/cm2): 0.847 T-score: -1.3 Z-score: 0.7 RIGHT FEMORAL NECK: BMD (in g/cm2): 0.876 T-score: -1.2 Z-score: 1.0 RIGHT TOTAL HIP: BMD (in g/cm2): 0.860 T-score: -1.2 Z-score: 0.8 LEFT FOREARM (RADIUS 33%): BMD (in g/cm2): 0.647 T-score: -0.8 Z-score: 1.9 FRAX 10-YEAR PROBABILITY OF FRACTURE: 10-year fracture risk is performed using the [facility] FRAX calculator based on patient-reported risk factors. Major osteoporotic fracture: 31.1% Hip fracture: 16.5% Other situations known to alter the reliability of the FRAX score should be considered when making treatment decisions, including chronic glucocorticoid use and past treatments. Further guidance on treatment can be found at the [facility] [employer]oporosis Foundation's website [email or web address]/. IMPRESSION: Osteopenia based on BMD. Fracture risk is unknown due to history of bone building therapy. RECOMMENDATIONS: 1. All patients should optimize calcium  and vitamin D  intake. 2. Consider FDA-approved medical therapies in postmenopausal women and men aged 45 years and older, based on the  following: - A hip or vertebral (clinical or morphometric) fracture - T-score less than or equal to -2.5 and secondary causes have been excluded. - Low bone mass (T-score between -1.0 and -2.5) and a 10-year probability of a hip fracture greater than or equal to 3% or a 10-year probability of a major osteoporosis-related fracture greater than or equal to 20% based on the [place] -adapted WHO algorithm. - Clinician judgment and/or patient preferences may indicate treatment for people with 10-year fracture probabilities above or below these levels 3. Patients with diagnosis of osteoporosis or at high risk for fracture should have regular bone mineral density tests. For patients eligible for Medicare, routine testing is allowed once every 2 years. The testing frequency can be increased  to one year for patients who have rapidly progressing disease, those who are receiving or discontinuing medical therapy to restore bone mass, or have additional risk factors. Electronically Signed   By: Dina  Arceo M.D.   On: 02/29/2024 13:39     EKG: Vent. rate 54 BPM PR interval 355 ms QRS duration 118 ms QT/QTcB 421/399 ms P-R-T axes 13 46 133 Sinus rhythm Prolonged PR interval Nonspecific intraventricular conduction delay Borderline low voltage, extremity leads Nonspecific T abnormalities, lateral leads    Principal Problem:   Stroke (HCC) Active Problems:   Essential (primary) hypertension   Type II diabetes mellitus with complication (HCC)   Hyperlipidemia   Chronic kidney disease, stage 3b (HCC)   CHF (congestive heart failure), NYHA class II, acute on chronic, combined (HCC)   CAD (coronary artery disease)   Right eye visual field defect, stroke versus retinal artery occlusion -Above finding concerning for acute CVA versus, artery occlusion - Admitted under CVA pathway, MRI brain ordered, is pending, CTA head and neck significant for multiple intracranial vascular severe stenosis, and atherosclerosis at  carotid purification without high-grade stenosis, but no acute CVA. - Will obtain 2D echo. - Will check lipid panel. - Will check A1c - Will obtain MRI brain. - Will consult PT, OT, SLP, bedside swallow evaluation, and if tolerates advance diet as tolerated - She is on home Plavix , and Libraxa ACS study by cardiology Milvexian 25 mg versus placebo.  She will be given full dose aspirin  in ED, continue home dose Plavix , will await further recommendation from neurology regarding her lab recs ACS study if it can be resumed or not, as if she acute CVA, will need clearance before resume anticoagulation. - Teleneurology consult placed in epic patient can be admitted to any pain as discussed with neurology at [facility]  CAD Ischemic cardiomyopathy Chronic systolic CHF Hypertension -Concern of acute CVA, will hold Entresto , Aldactone  and Toprol -XL to allow for permissive hypertension - Follow on 2D echo. - routine  cardiology consult will be placed in epic  given she is on Librexa of study, but for now we will hold till acute CVA is ruled out and neuro cleared her to resume  Hyperlipidemia - Continue with home medications, she is on Zetia  and Repatha  - Check LDL  Hypothyroidism -Continue with Synthroid   Incidental finding of thyroid  nodule -routine thyroid  ultrasound as an outpatient  Diabetes mellitus - Hold long-acting insulin  as she is still n.p.o. awaiting to pass her swallow evaluation, will keep an insulin  sliding scale   DVT Prophylaxis  SCDs   AM Labs Ordered, also please review Full Orders  Family Communication: Admission, patients condition and plan of care including tests being ordered have been discussed with the patient and spent, daughter who indicate understanding and agree with the plan and Code Status.  Code Status Full  Likely DC to  home  Condition GUARDED    Consults called: D/W neuro by phone  Admission status: inpatient    Time spent in minutes : 70  minutes   Seena Dadds M.D on 02/29/2024 at 9:59 PM   [facility] - Office  [phone]

## 2024-02-29 NOTE — Telephone Encounter (Signed)
 New Message:      Patient says all of a sudden, her right eye feels like a curtain, with 2 penis coming out the side of the curtain is coming down over her eye.It is such a weird feeling. She said the only thing different today, is she took Furosemide  for the first time.     Pt c/o medication issue:  1. Name of Medication: Furosemide   2. How are you currently taking this medication (dosage and times per day)?   3. Are you having a reaction (difficulty breathing--STAT)?   4. What is your medication issue? Took this medicine for the first time today- had and is still having something really weird happening to her right eye

## 2024-02-29 NOTE — ED Notes (Signed)
 Patient is being discharged from the Urgent Care and sent to the Emergency Department via private vehicle . Per NP, patient is in need of higher level of care due to loss of vision in right eye. Patient is aware and verbalizes understanding of plan of care.  Vitals:   02/29/24 1652  BP: (!) 148/74  Pulse: 60  Resp: 18  Temp: 98.3 F (36.8 C)  SpO2: 96%

## 2024-02-29 NOTE — Telephone Encounter (Addendum)
 Spoke with pt and who reports symptoms started today after getting out of her car.  Patient reports it is if a curtain is coming down across her right eye in her field of vision.  Pt states when she looks at the TV she can only see half of the screen from the bottom down.  Pt states she does not have an optometrist or ophthalmologist.  Pt denies any additional symptoms at this time. Pt advised she should visit an UC or ED an not wait as complaint could be related to a serious eye issue.  Pt verbalizes understanding and states she will have her neighbor drive her.  Pt thanked Charity fundraiser for the call.

## 2024-02-29 NOTE — Telephone Encounter (Signed)
 Pt's medication was sent to pt's pharmacy as requested. Confirmation received.

## 2024-02-29 NOTE — ED Triage Notes (Signed)
 States she can only see half vision in right eye that started today.

## 2024-02-29 NOTE — ED Triage Notes (Signed)
 Pt states she had a sudden loss of vision in her right eye approx at 1130 this morning. Dr. Annabell Key at bedside to evaluate pt.

## 2024-03-01 ENCOUNTER — Inpatient Hospital Stay (HOSPITAL_COMMUNITY)

## 2024-03-01 ENCOUNTER — Encounter (HOSPITAL_COMMUNITY): Payer: Self-pay | Admitting: Internal Medicine

## 2024-03-01 DIAGNOSIS — H34231 Retinal artery branch occlusion, right eye: Secondary | ICD-10-CM

## 2024-03-01 DIAGNOSIS — Z7902 Long term (current) use of antithrombotics/antiplatelets: Secondary | ICD-10-CM | POA: Diagnosis not present

## 2024-03-01 DIAGNOSIS — H5340 Unspecified visual field defects: Secondary | ICD-10-CM | POA: Diagnosis not present

## 2024-03-01 LAB — ECHOCARDIOGRAM COMPLETE
AR max vel: 1.67 cm2
AV Area VTI: 1.57 cm2
AV Area mean vel: 1.47 cm2
AV Mean grad: 4 mmHg
AV Peak grad: 7.2 mmHg
Ao pk vel: 1.34 m/s
Calc EF: 57.4 %
Height: 66 in
S' Lateral: 3.8 cm
Single Plane A2C EF: 60.4 %
Single Plane A4C EF: 55.2 %
Weight: 2659.63 [oz_av]

## 2024-03-01 LAB — GLUCOSE, CAPILLARY
Glucose-Capillary: 110 mg/dL — ABNORMAL HIGH (ref 70–99)
Glucose-Capillary: 233 mg/dL — ABNORMAL HIGH (ref 70–99)

## 2024-03-01 LAB — BASIC METABOLIC PANEL WITH GFR
Anion gap: 7 (ref 5–15)
BUN: 28 mg/dL — ABNORMAL HIGH (ref 8–23)
CO2: 23 mmol/L (ref 22–32)
Calcium: 8.3 mg/dL — ABNORMAL LOW (ref 8.9–10.3)
Chloride: 106 mmol/L (ref 98–111)
Creatinine, Ser: 1.14 mg/dL — ABNORMAL HIGH (ref 0.44–1.00)
GFR, Estimated: 49 mL/min — ABNORMAL LOW (ref >60)
Glucose, Bld: 118 mg/dL — ABNORMAL HIGH (ref 70–99)
Potassium: 3.8 mmol/L (ref 3.5–5.1)
Sodium: 136 mmol/L (ref 135–145)

## 2024-03-01 LAB — HEMOGLOBIN A1C
Hgb A1c MFr Bld: 7.4 % — ABNORMAL HIGH (ref 4.8–5.6)
Mean Plasma Glucose: 165.68 mg/dL

## 2024-03-01 LAB — CBC
HCT: 36.5 % (ref 36.0–46.0)
Hemoglobin: 11.5 g/dL — ABNORMAL LOW (ref 12.0–15.0)
MCH: 29.2 pg (ref 26.0–34.0)
MCHC: 31.5 g/dL (ref 30.0–36.0)
MCV: 92.6 fL (ref 80.0–100.0)
Platelets: 247 10*3/uL (ref 150–400)
RBC: 3.94 MIL/uL (ref 3.87–5.11)
RDW: 13.9 % (ref 11.5–15.5)
WBC: 6.4 10*3/uL (ref 4.0–10.5)
nRBC: 0 % (ref 0.0–0.2)

## 2024-03-01 LAB — LIPID PANEL
Cholesterol: 126 mg/dL (ref 0–200)
HDL: 41 mg/dL (ref >40)
LDL Cholesterol: 40 mg/dL (ref 0–99)
Total CHOL/HDL Ratio: 3.1 ratio
Triglycerides: 224 mg/dL — ABNORMAL HIGH (ref <150)
VLDL: 45 mg/dL — ABNORMAL HIGH (ref 0–40)

## 2024-03-01 LAB — SEDIMENTATION RATE: Sed Rate: 18 mm/h (ref 0–22)

## 2024-03-01 LAB — C-REACTIVE PROTEIN: CRP: 0.7 mg/dL (ref <1.0)

## 2024-03-01 MED ORDER — ASPIRIN 81 MG PO CHEW: 81.0000 mg | CHEWABLE_TABLET | Freq: Every day | ORAL | Status: DC

## 2024-03-01 MED ORDER — ASPIRIN 325 MG PO TABS: 325.0000 mg | ORAL_TABLET | Freq: Every day | ORAL | Status: DC

## 2024-03-01 MED ORDER — ASPIRIN 81 MG PO CHEW
81.0000 mg | CHEWABLE_TABLET | Freq: Every day | ORAL | Status: DC
  Administered 2024-03-01 – 2024-03-02 (×2): 81 mg via ORAL
  Filled 2024-03-01 (×2): qty 1

## 2024-03-01 NOTE — Evaluation (Signed)
 Physical Therapy Evaluation Patient Details Name: Angela Rose MRN: [id number] DOB: December 04, 1945 Today's Date: 03/01/2024  History of Present Illness  Angela Rose  is a 78 y.o. female,  female with a hx of MI in 1993 with coronary angioplasty and stenting, CABG 1996, HTN, HLD, PAD, former smoker, carotid atherosclerosis.  Ischemic cardiomyopathy, chronic systolic CHF  Patient is currently on Libraxa ACS study by cardiology Milvexian 25 mg versus placebo.  - Presents to ED secondary to complaints of loss of vision, patient reports she took her meds this morning, reports she had painless loss of vision in the right eye, in the superior visual field, quit around 1130 this a.m., 6 hours prior to arrival to ED, initially she went to urgent care for which she was instructed to come to ED for her vision loss, she denies any other focal deficits, no tingling, no numbness, no altered mental status.  Does appear patient was on both aspirin  and Plavix , as well she is currently on Librexa , so aspirin  has been discussed during recent cardiology visit creased her risk of bleeding.  - In ED CTA head and done showing significant small vessel disease, but no LVO, CT head with no intracranial abnormality, only significant for calcified left lobe nodule, [facility] hospitalist consulted to admit.   Clinical Impression  Patient functioning near baseline for functional mobility and gait other than c/o decreased visual field in right eye, otherwise demonstrates good return for completing functional activities and ambulating in room/hallways without loss of balance or bumping into objects. Plan:  Patient discharged from physical therapy to care of nursing for ambulation daily as tolerated for length of stay.          If plan is discharge home, recommend the following: Help with stairs or ramp for entrance   Can travel by private vehicle        Equipment Recommendations None recommended by PT  Recommendations  for Other Services       Functional Status Assessment Patient has not had a recent decline in their functional status     Precautions / Restrictions Precautions Precautions: None Recall of Precautions/Restrictions: Intact Restrictions Weight Bearing Restrictions Per Provider Order: No      Mobility  Bed Mobility Overal bed mobility: Independent                  Transfers Overall transfer level: Independent                      Ambulation/Gait Ambulation/Gait assistance: Modified independent (Device/Increase time) Gait Distance (Feet): 150 Feet Assistive device: None Gait Pattern/deviations: WFL(Within Functional Limits) Gait velocity: decreased     General Gait Details: grossly WFL with good return for ambulating in room, hallways without loss of balance or bumping into objects  Stairs            Wheelchair Mobility     Tilt Bed    Modified Rankin (Stroke Patients Only)       Balance Overall balance assessment: Independent                                           Pertinent Vitals/Pain Pain Assessment Pain Assessment: No/denies pain    Home Living Family/patient expects to be discharged to:: Private residence Living Arrangements: Spouse/significant other Available Help at Discharge: Available 24 hours/day;Other (Comment) Type of Home: House Home  Access: Stairs to enter   [employer] of Steps: 1   Home Layout: One level;Other (Comment) Home Equipment: Rolling Walker (2 wheels);Shower seat      Prior Function Prior Level of Function : Independent/Modified Independent;Driving             Mobility Comments: Tourist information centre manager without AD, drives ADLs Comments: Independent.     Extremity/Trunk Assessment   Upper Extremity Assessment Upper Extremity Assessment: Defer to OT evaluation    Lower Extremity Assessment Lower Extremity Assessment: Overall WFL for tasks assessed    Cervical /  Trunk Assessment Cervical / Trunk Assessment: Normal  Communication   Communication Communication: Impaired Factors Affecting Communication: Hearing impaired    Cognition Arousal: Alert Behavior During Therapy: WFL for tasks assessed/performed   PT - Cognitive impairments: No apparent impairments                         Following commands: Intact       Cueing Cueing Techniques: Verbal cues     General Comments      Exercises     Assessment/Plan    PT Assessment Patient does not need any further PT services  PT Problem List         PT Treatment Interventions      PT Goals (Current goals can be found in the Care Plan section)  Acute Rehab PT Goals Patient Stated Goal: return home with family to assist PT Goal Formulation: With patient Time For Goal Achievement: 03/01/24 Potential to Achieve Goals: Good    Frequency       Co-evaluation PT/OT/SLP Co-Evaluation/Treatment: Yes Reason for Co-Treatment: To address functional/ADL transfers PT goals addressed during session: Mobility/safety with mobility;Balance         AM-PAC PT "6 Clicks" Mobility  Outcome Measure Help needed turning from your back to your side while in a flat bed without using bedrails?: None Help needed moving from lying on your back to sitting on the side of a flat bed without using bedrails?: None Help needed moving to and from a bed to a chair (including a wheelchair)?: None Help needed standing up from a chair using your arms (e.g., wheelchair or bedside chair)?: None Help needed to walk in hospital room?: None Help needed climbing 3-5 steps with a railing? : A Little 6 Click Score: 23    End of Session   Activity Tolerance: Patient tolerated treatment well Patient left: in bed;with call bell/phone within reach Nurse Communication: Mobility status PT Visit Diagnosis: Unsteadiness on feet (R26.81);Other abnormalities of gait and mobility (R26.89);Muscle weakness  (generalized) (M62.81)    Time: 0810-0830 PT Time Calculation (min) (ACUTE ONLY): 20 min   Charges:   PT Evaluation $PT Eval Moderate Complexity: 1 Mod PT Treatments $Therapeutic Activity: 8-22 mins PT General Charges $$ ACUTE PT VISIT: 1 Visit         1:57 PM, 03/01/24 Walton Guppy, MPT Physical Therapist with [facility] 336 [phone] office [phone] mobile phone

## 2024-03-01 NOTE — Progress Notes (Signed)
 SLP Cancellation Note  Patient Details Name: Angela Rose MRN: [id number] DOB: [birth year]   Cancelled treatment:       Reason Eval/Treat Not Completed: SLP screened, no needs identified, will sign off. Speech, language and cognition are functioning at baseline. Thank you for this referral,  Kern Gingras H. Vergil Glasser, CCC-SLP Speech Language Pathologist    Florina Husbands 03/01/2024, 2:30 PM

## 2024-03-01 NOTE — Plan of Care (Signed)
  Problem: Education: Goal: Ability to describe self-care measures that may prevent or decrease complications (Diabetes Survival Skills Education) will improve Outcome: Progressing Goal: Individualized Educational Video(s) Outcome: Progressing   Problem: Coping: Goal: Ability to adjust to condition or change in health will improve Outcome: Progressing   Problem: Coping: Goal: Will verbalize positive feelings about self Outcome: Progressing   Problem: Self-Care: Goal: Ability to participate in self-care as condition permits will improve Outcome: Progressing Goal: Verbalization of feelings and concerns over difficulty with self-care will improve Outcome: Progressing   Problem: Coping: Goal: Level of anxiety will decrease Outcome: Progressing   Problem: Skin Integrity: Goal: Risk for impaired skin integrity will decrease Outcome: Progressing

## 2024-03-01 NOTE — Evaluation (Signed)
 Occupational Therapy Evaluation Patient Details Name: Angela Rose MRN: [id number] DOB: 1946-03-27 Today's Date: 03/01/2024   History of Present Illness   Angela Rose  is a 78 y.o. female,  female with a hx of MI in 1993 with coronary angioplasty and stenting, CABG 1996, HTN, HLD, PAD, former smoker, carotid atherosclerosis.  Ischemic cardiomyopathy, chronic systolic CHF  Patient is currently on Libraxa ACS study by cardiology Milvexian 25 mg versus placebo.  - Presents to ED secondary to complaints of loss of vision, patient reports she took her meds this morning, reports she had painless loss of vision in the right eye, in the superior visual field, quit around 1130 this a.m., 6 hours prior to arrival to ED, initially she went to urgent care for which she was instructed to come to ED for her vision loss, she denies any other focal deficits, no tingling, no numbness, no altered mental status.  Does appear patient was on both aspirin  and Plavix , as well she is currently on Librexa , so aspirin  has been discussed during recent cardiology visit creased her risk of bleeding.  - In ED CTA head and done showing significant small vessel disease, but no LVO, CT head with no intracranial abnormality, only significant for calcified left lobe nodule, [facility] hospitalist consulted to admit. (per MD)     Clinical Impressions Pt agreeable to OT and PT co-evaluation. Pt appears to be near functional baseline other than visual deficit of R upper quadrant loss of vision, despite this report the pt tested fairly well for peripheral vision via clock test and identification of an object in R visual field. Pt did have lack of convergence with R eye. Able to ambulate and complete ADL's independently. Pt left in the bed with call bell within reach. Pt is not recommended for further acute OT services and will be discharged to care of nursing staff for remaining length of stay.      If plan is discharge home,  recommend the following:   Assist for transportation     Functional Status Assessment   Patient has not had a recent decline in their functional status     Equipment Recommendations   None recommended by OT     Recommendations for Other Services   Other (comment) (Vision specialist evaluation.)     Precautions/Restrictions   Precautions Precautions: None Restrictions Weight Bearing Restrictions Per Provider Order: No     Mobility Bed Mobility Overal bed mobility: Independent                  Transfers Overall transfer level: Independent                        Balance Overall balance assessment: Independent                                         ADL either performed or assessed with clinical judgement   ADL Overall ADL's : Independent                                             Vision Baseline Vision/History: 1 Wears glasses Ability to See in Adequate Light: 1 Impaired Patient Visual Report: Peripheral vision impairment (Missing half of R visual field in the  upper quadrant per the pt.) Vision Assessment?: Yes Eye Alignment: Within Functional Limits Tracking/Visual Pursuits: Able to track stimulus in all quads without difficulty Convergence: Impaired (comment) (no R eye convergence) Visual Fields: Other (comment) (Pt reports R visual field deficit but testing did not indicate significant issues nor did the clock test, but pt reports missing sight in the upper R quadrant if using only the R eye.)     Perception Perception: Not tested       Praxis Praxis: Not tested       Pertinent Vitals/Pain Pain Assessment Pain Assessment: No/denies pain     Extremity/Trunk Assessment Upper Extremity Assessment Upper Extremity Assessment: Overall WFL for tasks assessed   Lower Extremity Assessment Lower Extremity Assessment: Defer to PT evaluation   Cervical / Trunk Assessment Cervical / Trunk  Assessment: Normal   Communication Communication Communication: Impaired Factors Affecting Communication: Hearing impaired (Deaf in L ear.)   Cognition Arousal: Alert Behavior During Therapy: WFL for tasks assessed/performed Cognition: No apparent impairments                               Following commands: Intact       Cueing  General Comments   Cueing Techniques: Verbal cues                 Home Living Family/patient expects to be discharged to:: Private residence Living Arrangements: Spouse/significant other Available Help at Discharge: Available 24 hours/day;Other (Comment) (partner) Type of Home: House Home Access: Stairs to enter [employer] of Steps: 1 Entrance Stairs-Rails:  (pillars) Home Layout: One level;Other (Comment) (has attick)     Bathroom Shower/Tub: Chief Strategy Officer: Handicapped height Bathroom Accessibility: No   Home Equipment: Agricultural consultant (2 wheels);Shower seat          Prior Functioning/Environment Prior Level of Function : Independent/Modified Independent             Mobility Comments: Tourist information centre manager without AD. ADLs Comments: Independent.                            Co-evaluation PT/OT/SLP Co-Evaluation/Treatment: Yes Reason for Co-Treatment: To address functional/ADL transfers   OT goals addressed during session: ADL's and self-care      AM-PAC OT "6 Clicks" Daily Activity     Outcome Measure Help from another person eating meals?: None Help from another person taking care of personal grooming?: None Help from another person toileting, which includes using toliet, bedpan, or urinal?: None Help from another person bathing (including washing, rinsing, drying)?: None Help from another person to put on and taking off regular upper body clothing?: None Help from another person to put on and taking off regular lower body clothing?: None 6 Click Score: 24   End of  Session    Activity Tolerance: Patient tolerated treatment well Patient left: in bed;with call bell/phone within reach  OT Visit Diagnosis: Low vision, both eyes (H54.2)                Time: 1610-9604 OT Time Calculation (min): 18 min Charges:  OT General Charges $OT Visit: 1 Visit OT Evaluation $OT Eval Low Complexity: 1 Low  Robi Dewolfe OT, MOT   Thurnell Floss 03/01/2024, 9:52 AM

## 2024-03-01 NOTE — Progress Notes (Signed)
*  PRELIMINARY RESULTS* Echocardiogram 2D Echocardiogram has been performed.  Angela Rose 03/01/2024, 12:23 PM

## 2024-03-01 NOTE — Consult Note (Signed)
 I connected with  Angela Rose on 03/01/24 by a video enabled telemedicine application and verified that I am speaking with the correct person using two identifiers.   I discussed the limitations of evaluation and management by telemedicine. The patient expressed understanding and agreed to proceed.  [facility]: [facility]  Neurology Consultation Reason for Consult: Stroke Referring Physician: Dr Janas Meadows  CC: Right eye vision loss  History is obtained from: Patient, chart review  HPI: Angela Rose is a 78 y.o. female with past medical history of hypertension, hyperlipidemia, type 2 diabetes, congestive heart failure, chronic kidney disease, anxiety, depression, basal cell carcinoma of face, hypothyroidism, coronary artery disease status post CABG in 1996, prior nicotine use disorder who presented with vision loss in right eye. States at around 1130 am yesterday she noticed she couldn't see out of the top of her right eye. Denies any pain, numbness, weakness, similar symptoms in past.   Patient is on Plavix  75mg  daily at home.  She was also on aspirin  81 mg daily which was discontinued at the beginning of May.  Of note patient is on librexia trial getting either placebo or the study drug milvexian.   Last known normal: 02/29/2024 at around 1130 Event happened at home No tPA as outside window No thrombectomy as no large vessel occlusion mRS 0  ROS: All other systems reviewed and negative except as noted in the HPI.   Past Medical History:  Diagnosis Date   Anxiety 07/10/2014   Basal cell carcinoma    Cancer (HCC) 12/13/2011   melanoma face   CHF (congestive heart failure), NYHA class II, acute on chronic, combined (HCC) 01/14/2023   Chronic kidney disease (CKD), stage III (moderate) (HCC) 02/17/2022   Chronic sinusitis 07/02/2022   Depression 07/10/2014   Diabetes mellitus without complication  (HCC) 08/21/2014   Fracture of tibial plateau 12/31/2015   Hyperlipidemia 08/14/2015   Hypertension 09/03/2014   Hypothyroidism, postablative 02/08/2014   Melanoma (HCC)    Myocardial infarction [facility]) 1993   Tick bites 10/29/2014   Vitamin D  deficiency 12/08/2022     Family History  Problem Relation Age of Onset   Skin cancer Mother    Squamous cell carcinoma Mother    Basal cell carcinoma Mother    Heart disease Father    CAD Father    Breast cancer Neg Hx     Social History:  reports that she quit smoking about 41 years ago. Her smoking use included cigarettes. She has never used smokeless tobacco. She reports current alcohol use. She reports that she does not use drugs.   Medications Prior to Admission  Medication Sig Dispense Refill Last Dose/Taking   Cholecalciferol (VITAMIN D3) 50 MCG (2000 UT) capsule Take 2,000 Units by mouth daily.   02/29/2024 Morning   clopidogrel  (PLAVIX ) 75 MG tablet Take 1 tablet (75 mg total) by mouth daily. 90 tablet 1 02/29/2024 at  7:45 AM   ezetimibe  (ZETIA ) 10 MG tablet Take 1 tablet (10 mg total) by mouth daily. 90 tablet 3 02/28/2024 Bedtime   FLUoxetine  (PROZAC ) 40 MG capsule TAKE 1 CAPSULE(40 MG) BY MOUTH DAILY 90 capsule 3 02/29/2024 Morning   furosemide  (LASIX ) 40 MG tablet TAKE 1 TABLET BY MOUTH AS NEEDED FOR EDEMA(3 LB WEIGHT GAIN OVERNIGHT) (Patient taking differently: Take 20 mg by mouth daily as needed for fluid or edema (weight gain over 3 lb overnight).) 30 tablet 5 02/29/2024 Morning  insulin  degludec (TRESIBA  FLEXTOUCH) 100 UNIT/ML FlexTouch Pen Inject 12 Units into the skin at bedtime. DX Code E11.8 , Z79.4 12 mL 3 02/28/2024 Bedtime   levothyroxine  (SYNTHROID ) 50 MCG tablet Take 1 tablet (50 mcg total) by mouth daily before breakfast. (Patient taking differently: Take 50 mcg by mouth at bedtime.) 90 tablet 3 02/29/2024 Morning   metFORMIN  (GLUCOPHAGE -XR) 500 MG 24 hr tablet Take 1 tablet (500 mg total) by mouth daily with breakfast.  Resume on 01/18/23 90 tablet 3 02/29/2024 Morning   metoprolol  succinate (TOPROL -XL) 25 MG 24 hr tablet Take 1 tablet (25 mg total) by mouth daily. 90 tablet 1 02/29/2024 Morning   nitroGLYCERIN  (NITROSTAT ) 0.4 MG SL tablet Place 1 tablet (0.4 mg total) under the tongue every 5 (five) minutes as needed for chest pain. 25 tablet 11 02/29/2024 at  9:00 AM   REPATHA  SURECLICK 140 MG/ML SOAJ Inject 140 mg into the skin every 14 (fourteen) days. 2 mL 12 Past Month   sacubitril -valsartan  (ENTRESTO ) 24-26 MG TAKE 1 TABLET BY MOUTH TWICE DAILY 180 tablet 1 02/29/2024 Morning   spironolactone  (ALDACTONE ) 25 MG tablet TAKE 1/2 TABLET(12.5 MG) BY MOUTH DAILY 45 tablet 1 02/29/2024 Morning   Study - LIBREXIA-ACS - milvexian 25 mg or placebo tablet (PI-Stuckey) Take 1 tablet by mouth 2 (two) times daily. Take 1 tablet by mouth twice a day with or without food. Bring bottle back to every visit. Please contact [facility] for any questions or concerns regarding the medication. For Investigational Use Only. 420 tablet 0 02/29/2024 Morning   Blood Glucose Monitoring Suppl (ACCU-CHEK GUIDE ME) w/Device KIT Use to check blood sugars twice a day , before breakfast and before bed. 1 kit 0    Glucose Blood (ACCU-CHEK GUIDE TEST VI) by In Vitro route.      glucose blood (ACCU-CHEK GUIDE TEST) test strip Use to check blood sugar before breakfast and before bed. 100 each 11    Glucose Blood (BLOOD GLUCOSE TEST STRIPS) STRP Use to check glucose twice daily 100 strip 11    Insulin  Pen Needle (BD PEN NEEDLE NANO U/F) 32G X 4 MM MISC Use to check glucose once daily 100 each 6       Exam: Current vital signs: BP (!) 134/52 (BP Location: Right Arm)   Pulse (!) 57   Temp 97.7 F (36.5 C) (Oral)   Resp 18   Ht 5\' 6"  (1.676 m)   Wt 75.4 kg   SpO2 98%   BMI 26.83 kg/m  Vital signs in last 24 hours: Temp:  [96.7 F (35.9 C)-98.3 F (36.8 C)] 97.7 F (36.5 C) (05/22 0320) Pulse Rate:  [48-60] 57 (05/22 0320) Resp:   [12-20] 18 (05/22 0320) BP: (127-156)/(52-78) 134/52 (05/22 0320) SpO2:  [95 %-99 %] 98 % (05/22 0320) FiO2 (%):  [21 %] 21 % (05/21 2158) Weight:  [75.4 kg] 75.4 kg (05/21 2328)   Physical Exam  Constitutional: Appears well-developed and well-nourished.  Psych: Affect appropriate to situation Neuro: AOX3, CN grossly intact except right superior hemianopia, antigravity strength in all extremities, FTN intact, sensory intact   NIHSS 1  INPUTS: 1A: Level of consciousness --> 0 = Alert; keenly responsive 1B: Ask month and age --> 0 = Both questions right 1C: 'Blink eyes' & 'squeeze hands' --> 0 = Performs both tasks 2: Horizontal extraocular movements --> 0 = Normal 3: Visual fields --> 1 = Partial hemianopia  4: Facial palsy --> 0 = Normal symmetry 5A: Left arm motor  drift --> 0 = No drift for 10 seconds 5B: Right arm motor drift --> 0 = No drift for 10 seconds 6A: Left leg motor drift --> 0 = No drift for 5 seconds 6B: Right leg motor drift --> 0 = No drift for 5 seconds 7: Limb Ataxia --> 0 = No ataxia 8: Sensation --> 0 = Normal; no sensory loss 9: Language/aphasia --> 0 = Normal; no aphasia 10: Dysarthria --> 0 = Normal 11: Extinction/inattention --> 0 = No abnormality   I have reviewed labs in epic and the results pertinent to this consultation are: CBC:  Recent Labs  Lab 02/29/24 1852 03/01/24 0451  WBC 7.1 6.4  NEUTROABS 4.3  --   HGB 11.7* 11.5*  HCT 35.4* 36.5  MCV 91.2 92.6  PLT 276 247    Basic Metabolic Panel:  Lab Results  Component Value Date   NA 136 03/01/2024   K 3.8 03/01/2024   CO2 23 03/01/2024   GLUCOSE 118 (H) 03/01/2024   BUN 28 (H) 03/01/2024   CREATININE 1.14 (H) 03/01/2024   CALCIUM  8.3 (L) 03/01/2024   GFRNONAA 49 (L) 03/01/2024   GFRAA 49 (L) 05/02/2020   Lipid Panel:  Lab Results  Component Value Date   LDLCALC 40 03/01/2024   HgbA1c:  Lab Results  Component Value Date   HGBA1C 7.4 (H) 02/29/2024   Urine Drug Screen: No  results found for: "LABOPIA", "COCAINSCRNUR", "LABBENZ", "AMPHETMU", "THCU", "LABBARB"  Alcohol Level No results found for: "ETH"   I have reviewed the images obtained:  CT head without contrast 02/29/2024: No acute abnormality  CTA head and neck with and without contrast 02/29/2024: No large vessel occlusion. Multiple intracranial vascular stenoses as above. Severe stenosis of the mid M1 segment right MCA. Additional severe stenosis of the A3 segment left ACA. Moderate stenosis of the P2 segments of the bilateral PCAs. Atherosclerosis of the V4 segment right vertebral artery resulting in severe stenosis. Atherosclerosis at the carotid bifurcations without high-grade stenosis.  MRI brain without contrast 03/01/2024: No acute intracranial finding. Mild chronic small vessel ischemic changes within the cerebral white matter and pons. Several nonspecific punctate chronic microhemorrhages within the supratentorial and infratentorial brain.   ASSESSMENT/PLAN: 78yo F with right superior hemianopia.   Branch retinal artery occlusion ( BRAO), right - MRI didnt show any stroke. Symptoms likely due to BRAO  Recommendations: - Discussed with Dr Berry [place], [place] to resume ASA 81mg  daily and plavix  75mg  daily for now. - Didn't tolerate statin, continue Repatha  - 30 day cardiac monitor to look for par A fib - Refer to ophthalmology as outpatient - Goal BP: normotension - PT/OT. No driving for now due to vision disturbance - F/u with neuro in 2-3 months ( order placed) - Discussed with patient, husband and daughter at bedside - Discussed with hospitalist via secure chat   Thank you for allowing us  to participate in the care of this patient. If you have any further questions, please contact  me or neurohospitalist.   Roxy Cordial Epilepsy [facility] neurohospitalist

## 2024-03-01 NOTE — TOC CM/SW Note (Signed)
 Transition of Care [facility]) - Inpatient Brief Assessment   Patient Details  Name: Angela Rose MRN: [id number] Date of Birth: 01-19-1946  Transition of Care [facility]) CM/SW Contact:    Grandville Lax, LCSWA Phone Number: 03/01/2024, 12:19 PM   Clinical Narrative: Transition of Care [facility]) has reviewed patient and no TOC needs have been identified at this time. We will continue to monitor patient advancement through interdiciplinary progression rounds. If new patient transition needs arise, please place a TOC consult.   Transition of Care Asessment: Insurance and Status: Insurance coverage has been reviewed Patient has primary care physician: Yes Home environment has been reviewed: From home Prior level of function:: Independent Prior/Current Home Services: No current home services Social Drivers of Health Review: SDOH reviewed no interventions necessary Readmission risk has been reviewed: Yes Transition of care needs: no transition of care needs at this time

## 2024-03-01 NOTE — Progress Notes (Signed)
 PROGRESS NOTE  Angela Rose:[id number] DOB: [birth year] DOA: 02/29/2024 PCP: Tobi Fortes, MD  HPI/Recap of past 24 hours: Angela Rose is a 78 y.o. female with a hx of MI in 1993 s/p stent, CABG 1996, HTN, HLD, PAD, former smoker, carotid atherosclerosis, chronic systolic CHF, presents to ED secondary to complaints of painless loss of vision in the upper field of right eye started around 11:30 am on 02/29/24. Initially, pt went to urgent care for which she was instructed to go to ED. Denied any other focal neurologic deficits.  In the ED, vital signs fairly stable, except with heart rates in the 50s.  Labs showed mild hyponatremia, creatinine 1.1, A1c 7.4. CTA head and neck showed significant small vessel disease, but no LVO, CT head with no intracranial abnormality, only significant for calcified nodule in the left thyroid  lobe around 2 cm.  Of note, patient is currently on a Librexia ACS study by cardiology (Milvexian 25 mg Vs placebo).  Patient was also on aspirin  and Plavix , of which ASA was recently discontinued by her cardiologist about a week ago due to risk of bleeding.  [facility] hospital[facility] admitted for further management.   Today, patient still with right eye upper visual field defect.  Denies any headaches, new focal neurologic deficits, chest pain, shortness of breath, fever/chills.   Assessment/Plan: Principal Problem:   Stroke [facility]) Active Problems:   Essential (primary) hypertension   Type II diabetes mellitus with complication (HCC)   Hyperlipidemia   Chronic kidney disease, stage 3b (HCC)   CHF (congestive heart failure), NYHA class II, acute on chronic, combined (HCC)   CAD (coronary artery disease)   Right eye visual field defect ??CVA versus retinal artery occlusion CTA head and neck significant for multiple intracranial vascular severe stenosis, and atherosclerosis at carotid purification without high-grade stenosis, but no acute CVA MRI brain  pending ECHO pending  A1c 7.4, LDL 40  PT, OT, SLP Continue home Plavix , received ASA in ED Discussed with PI from research Dr Judy Null on 03/01/2024, recommended hold Milvexian which is part of the Libraxa ACS study by cardiology Neurology consulted, appreciate recommendation Telemetry, frequent neurochecks  CAD Ischemic cardiomyopathy Chronic systolic CHF Hypertension Hold Entresto , Aldactone  and Toprol -XL to allow for permissive hypertension Pending Routine cardiology consult was placed in epic given she is on Librexa study, which is currently on hold as mentioned above pending MRI brain and further recommendations by neurology as well as cardiology  Hyperlipidemia Continue with home medications, she is on Zetia  and Repatha  LDL 40   Hypothyroidism Continue with Synthroid    Incidental finding of thyroid  nodule Routine thyroid  ultrasound as an outpatient   Diabetes mellitus Last A1c 7.4 Continue SSI, hold home Tresiba  pending CBGs      Estimated body mass index is 26.83 kg/m as calculated from the following:   Height as of this encounter: 5\' 6"  (1.676 m).   Weight as of this encounter: 75.4 kg.     Code Status: Full  Family Communication: None at bedside  Disposition Plan: Status is: Inpatient Remains inpatient appropriate because: Level of care      Consultants: Neurology  Procedures: None  Antimicrobials: None  DVT prophylaxis: None   Objective: Vitals:   02/29/24 2300 02/29/24 2328 03/01/24 0213 03/01/24 0320  BP: (!) 145/58   (!) 134/52  Pulse: (!) 56   (!) 57  Resp: 19   18  Temp: 97.8 F (36.6 C)   97.7 F (36.5 C)  TempSrc: Oral  Oral  SpO2: 98%   98%  Weight:  75.4 kg    Height:   5\' 6"  (1.676 m)    No intake or output data in the 24 hours ending 03/01/24 1038 Filed Weights   02/29/24 2328  Weight: 75.4 kg    Exam: General: NAD  Cardiovascular: S1, S2 present Respiratory: CTAB Abdomen: Soft, nontender, nondistended, bowel  sounds present Musculoskeletal: No bilateral pedal edema noted Skin: Normal Psychiatry: Normal mood  Neurology: Noted right upper visual field defects, strength equal in all extremities, sensation intact, gait untested.   Data Reviewed: CBC: Recent Labs  Lab 02/29/24 1852 03/01/24 0451  WBC 7.1 6.4  NEUTROABS 4.3  --   HGB 11.7* 11.5*  HCT 35.4* 36.5  MCV 91.2 92.6  PLT 276 247   Basic Metabolic Panel: Recent Labs  Lab 02/29/24 1852 03/01/24 0451  NA 131* 136  K 4.0 3.8  CL 101 106  CO2 24 23  GLUCOSE 102* 118*  BUN 26* 28*  CREATININE 1.13* 1.14*  CALCIUM  8.3* 8.3*   GFR: Estimated Creatinine Clearance: 40.8 mL/min (A) (by C-G formula based on SCr of 1.14 mg/dL (H)). Liver Function Tests: No results for input(s): "AST", "ALT", "ALKPHOS", "BILITOT", "PROT", "ALBUMIN" in the last 168 hours. No results for input(s): "LIPASE", "AMYLASE" in the last 168 hours. No results for input(s): "AMMONIA" in the last 168 hours. Coagulation Profile: Recent Labs  Lab 02/29/24 1852  INR 1.0   Cardiac Enzymes: No results for input(s): "CKTOTAL", "CKMB", "CKMBINDEX", "TROPONINI" in the last 168 hours. BNP (last 3 results) No results for input(s): "PROBNP" in the last 8760 hours. HbA1C: Recent Labs    02/29/24 1852  HGBA1C 7.4*   CBG: Recent Labs  Lab 02/29/24 2311 03/01/24 0804  GLUCAP 173* 110*   Lipid Profile: Recent Labs    03/01/24 0451  CHOL 126  HDL 41  LDLCALC 40  TRIG 224*  CHOLHDL 3.1   Thyroid  Function Tests: No results for input(s): "TSH", "T4TOTAL", "FREET4", "T3FREE", "THYROIDAB" in the last 72 hours. Anemia Panel: No results for input(s): "VITAMINB12", "FOLATE", "FERRITIN", "TIBC", "IRON", "RETICCTPCT" in the last 72 hours. Urine analysis:    Component Value Date/Time   COLORURINE YELLOW 11/02/2019 0947   APPEARANCEUR Cloudy (A) 12/27/2023 1200   LABSPEC 1.015 11/02/2019 0947   PHURINE 5.5 11/02/2019 0947   GLUCOSEU 3+ (A) 12/27/2023 1200    HGBUR NEGATIVE 11/02/2019 0947   BILIRUBINUR Negative 12/27/2023 1200   KETONESUR NEGATIVE 11/02/2019 0947   PROTEINUR Trace 12/27/2023 1200   PROTEINUR NEGATIVE 11/02/2019 0947   UROBILINOGEN 0.2 12/25/2019 1028   NITRITE Positive (A) 12/27/2023 1200   NITRITE NEGATIVE 11/02/2019 0947   LEUKOCYTESUR 2+ (A) 12/27/2023 1200   LEUKOCYTESUR MODERATE (A) 11/02/2019 0947   Sepsis Labs: @LABRCNTIP (procalcitonin:4,lacticidven:4)  )No results found for this or any previous visit (from the past 240 hours).    Studies: CT ANGIO HEAD NECK W WO CM Result Date: 02/29/2024 CLINICAL DATA:  Neuro deficit, concern for stroke, sudden loss of vision in right eye this morning. EXAM: CT ANGIOGRAPHY HEAD AND NECK WITH AND WITHOUT CONTRAST TECHNIQUE: Multidetector CT imaging of the head and neck was performed using the standard protocol during bolus administration of intravenous contrast. Multiplanar CT image reconstructions and MIPs were obtained to evaluate the vascular anatomy. Carotid stenosis measurements (when applicable) are obtained utilizing NASCET criteria, using the distal internal carotid diameter as the denominator. RADIATION DOSE REDUCTION: This exam was performed according to the departmental dose-optimization program which  includes automated exposure control, adjustment of the mA and/or kV according to patient size and/or use of iterative reconstruction technique. CONTRAST:  75mL OMNIPAQUE  IOHEXOL  350 MG/ML SOLN COMPARISON:  CT head 07/13/2022. FINDINGS: CT HEAD FINDINGS Brain: No acute intracranial hemorrhage. No CT evidence of acute infarct. No edema, mass effect, or midline shift. The basilar cisterns are patent. Ventricles: Ventricles are normal in size and configuration. Vascular: No hyperdense vessel. Intracranial atherosclerotic calcifications noted. Skull: No acute or aggressive finding. Sinuses/orbits: The visualized paranasal sinuses are clear. Orbits are symmetric. Other: Mastoid air cells  are clear. CTA NECK FINDINGS Aortic arch: Standard configuration of the aortic arch. Imaged portion shows no evidence of aneurysm or dissection. Mild atherosclerosis. No significant stenosis of the major arch vessel origins. Pulmonary arteries: As permitted by contrast timing, there are no filling defects in the visualized pulmonary arteries. Subclavian arteries: The subclavian arteries are patent bilaterally. Right carotid system: Patent. Mild atherosclerosis along the common carotid artery. Mild atherosclerosis at the carotid bifurcation without hemodynamically significant stenosis. No evidence of dissection. Left carotid system: No evidence of dissection, stenosis (50% or greater), or occlusion. Mild atherosclerosis along the common carotid artery. Vertebral arteries: Codominant. Limited evaluation of the bilateral V1 segments due to adjacent dense venous contrast. Atherosclerosis of the bilateral V4 segments. Focal severe stenosis of the right V4 segment. Mild stenosis of the left V4 segment. No evidence of dissection. Skeleton: No acute findings. Degenerative changes in the cervical spine. Partially visualized sequelae of median sternotomy. Other neck: The visualized airway is patent. No cervical lymphadenopathy. Partially calcified nodule in the left thyroid  lobe which measures approximately 2 cm in diameter. Upper chest: Visualized lung apices are clear. Review of the MIP images confirms the above findings CTA HEAD FINDINGS ANTERIOR CIRCULATION: The intracranial ICAs are patent bilaterally. Atherosclerosis of the bilateral carotid siphons. Moderate stenosis of the bilateral supraclinoid ICAs. No proximal occlusion, aneurysm, or vascular malformation. MCAs: The right M1 segment is patent. Focal severe stenosis of the mid right M1 segment without evidence of occlusion. ACAs: Patent bilaterally. Moderate stenosis of the proximal left A1 segment. Additional severe stenosis of the A3 segment left ACA. POSTERIOR  CIRCULATION: No significant stenosis, proximal occlusion, aneurysm, or vascular malformation. PCAs: Patent bilaterally. Moderate stenosis of the P2 a segment right PCA. Additional moderate stenosis of the P2 a segment left PCA. Pcomm: The posterior communicating arteries are visualized bilaterally. SCAs: The superior cerebellar arteries are patent bilaterally. Basilar artery: Patent AICAs: Patent PICAs: Not well visualized. Vertebral arteries: As above. Venous sinuses: As permitted by contrast timing, patent. Anatomic variants: None Review of the MIP images confirms the above findings IMPRESSION: No large vessel occlusion. Multiple intracranial vascular stenoses as above. Severe stenosis of the mid M1 segment right MCA. Additional severe stenosis of the A3 segment left ACA. Moderate stenosis of the P2 segments of the bilateral PCAs. Atherosclerosis of the V4 segment right vertebral artery resulting in severe stenosis. Atherosclerosis at the carotid bifurcations without high-grade stenosis. No CT evidence of acute intracranial abnormality. Partially calcified nodule in the left thyroid  lobe likely measuring around 2 cm in diameter. Recommend correlation with nonemergent thyroid  ultrasound. Aortic Atherosclerosis (ICD10-I70.0). Electronically Signed   By: Denny Flack M.D.   On: 02/29/2024 19:17   DG Bone Density Result Date: 02/29/2024 EXAM: DUAL X-RAY ABSORPTIOMETRY (DXA) FOR BONE MINERAL DENSITY 02/29/2024 11:23 am CLINICAL DATA:  78 year old Female Postmenopausal. Post menopausal estrogen deficient History of fragility fracture. Patient is or has been on bone building  therapies. TECHNIQUE: An axial (e.g., hips, spine) and/or appendicular (e.g., radius) exam was performed, as appropriate, using GE Psychologist, sport and exercise at [facility]. Images are obtained for bone mineral density measurement and are not obtained for diagnostic purposes. [id number] Exclusions: Lumbar spine due to advanced degenerative  changes COMPARISON:  None. New baseline. FINDINGS: Scan quality: Good. LEFT FEMORAL NECK: BMD (in g/cm2): 0.842 T-score: -1.4 Z-score: 0.8 LEFT TOTAL HIP: BMD (in g/cm2): 0.847 T-score: -1.3 Z-score: 0.7 RIGHT FEMORAL NECK: BMD (in g/cm2): 0.876 T-score: -1.2 Z-score: 1.0 RIGHT TOTAL HIP: BMD (in g/cm2): 0.860 T-score: -1.2 Z-score: 0.8 LEFT FOREARM (RADIUS 33%): BMD (in g/cm2): 0.647 T-score: -0.8 Z-score: 1.9 FRAX 10-YEAR PROBABILITY OF FRACTURE: 10-year fracture risk is performed using the [facility] FRAX calculator based on patient-reported risk factors. Major osteoporotic fracture: 31.1% Hip fracture: 16.5% Other situations known to alter the reliability of the FRAX score should be considered when making treatment decisions, including chronic glucocorticoid use and past treatments. Further guidance on treatment can be found at the [facility]'s website [email or web address]/. IMPRESSION: Osteopenia based on BMD. Fracture risk is unknown due to history of bone building therapy. RECOMMENDATIONS: 1. All patients should optimize calcium  and vitamin D  intake. 2. Consider FDA-approved medical therapies in postmenopausal women and men aged 30 years and older, based on the following: - A hip or vertebral (clinical or morphometric) fracture - T-score less than or equal to -2.5 and secondary causes have been excluded. - Low bone mass (T-score between -1.0 and -2.5) and a 10-year probability of a hip fracture greater than or equal to 3% or a 10-year probability of a major osteoporosis-related fracture greater than or equal to 20% based on the [place] -adapted WHO algorithm. - Clinician judgment and/or patient preferences may indicate treatment for people with 10-year fracture probabilities above or below these levels 3. Patients with diagnosis of osteoporosis or at high risk for fracture should have regular bone mineral density tests. For patients eligible for Medicare, routine testing is allowed once  every 2 years. The testing frequency can be increased to one year for patients who have rapidly progressing disease, those who are receiving or discontinuing medical therapy to restore bone mass, or have additional risk factors. Electronically Signed   By: Dina  Arceo M.D.   On: 02/29/2024 13:39    Scheduled Meds:  clopidogrel   75 mg Oral Daily   ezetimibe   10 mg Oral QHS   FLUoxetine   40 mg Oral Daily   insulin  aspart  0-9 Units Subcutaneous TID WC   levothyroxine   50 mcg Oral QAC breakfast    Continuous Infusions:   LOS: 1 day     [place] Kretz J Mahogani Holohan, MD [facility]  If 7PM-7AM, please contact night-coverage [email or web address] 03/01/2024, 10:38 AM

## 2024-03-02 ENCOUNTER — Telehealth: Payer: Self-pay | Admitting: *Deleted

## 2024-03-02 DIAGNOSIS — H34231 Retinal artery branch occlusion, right eye: Secondary | ICD-10-CM | POA: Diagnosis not present

## 2024-03-02 DIAGNOSIS — H35353 Cystoid macular degeneration, bilateral: Secondary | ICD-10-CM | POA: Diagnosis not present

## 2024-03-02 DIAGNOSIS — I639 Cerebral infarction, unspecified: Secondary | ICD-10-CM

## 2024-03-02 DIAGNOSIS — H5340 Unspecified visual field defects: Secondary | ICD-10-CM | POA: Diagnosis not present

## 2024-03-02 LAB — BASIC METABOLIC PANEL WITH GFR
Anion gap: 5 (ref 5–15)
BUN: 27 mg/dL — ABNORMAL HIGH (ref 8–23)
CO2: 25 mmol/L (ref 22–32)
Calcium: 8.1 mg/dL — ABNORMAL LOW (ref 8.9–10.3)
Chloride: 106 mmol/L (ref 98–111)
Creatinine, Ser: 1.19 mg/dL — ABNORMAL HIGH (ref 0.44–1.00)
GFR, Estimated: 46 mL/min — ABNORMAL LOW
Glucose, Bld: 124 mg/dL — ABNORMAL HIGH (ref 70–99)
Potassium: 4.2 mmol/L (ref 3.5–5.1)
Sodium: 136 mmol/L (ref 135–145)

## 2024-03-02 LAB — CBC
HCT: 37 % (ref 36.0–46.0)
Hemoglobin: 12.2 g/dL (ref 12.0–15.0)
MCH: 30.7 pg (ref 26.0–34.0)
MCHC: 33 g/dL (ref 30.0–36.0)
MCV: 93 fL (ref 80.0–100.0)
Platelets: 253 K/uL (ref 150–400)
RBC: 3.98 MIL/uL (ref 3.87–5.11)
RDW: 13.9 % (ref 11.5–15.5)
WBC: 6.4 K/uL (ref 4.0–10.5)
nRBC: 0 % (ref 0.0–0.2)

## 2024-03-02 LAB — GLUCOSE, CAPILLARY
Glucose-Capillary: 216 mg/dL — ABNORMAL HIGH (ref 70–99)
Glucose-Capillary: 176 mg/dL — ABNORMAL HIGH (ref 70–99)
Glucose-Capillary: 130 mg/dL — ABNORMAL HIGH (ref 70–99)

## 2024-03-02 MED ORDER — ASPIRIN 81 MG PO TBEC: 81.0000 mg | DELAYED_RELEASE_TABLET | Freq: Every day | ORAL | 0 refills | Status: AC

## 2024-03-02 NOTE — Plan of Care (Signed)
  Problem: Education: Goal: Ability to describe self-care measures that may prevent or decrease complications (Diabetes Survival Skills Education) will improve Outcome: Progressing   Problem: Fluid Volume: Goal: Ability to maintain a balanced intake and output will improve Outcome: Progressing   Problem: Metabolic: Goal: Ability to maintain appropriate glucose levels will improve Outcome: Progressing   Problem: Education: Goal: Knowledge of disease or condition will improve Outcome: Progressing   Problem: Ischemic Stroke/TIA Tissue Perfusion: Goal: Complications of ischemic stroke/TIA will be minimized Outcome: Progressing   Problem: Coping: Goal: Will identify appropriate support needs Outcome: Progressing   Problem: Self-Care: Goal: Ability to participate in self-care as condition permits will improve Outcome: Progressing Goal: Verbalization of feelings and concerns over difficulty with self-care will improve Outcome: Progressing Goal: Ability to communicate needs accurately will improve Outcome: Progressing   Problem: Nutrition: Goal: Risk of aspiration will decrease Outcome: Progressing Goal: Dietary intake will improve Outcome: Progressing

## 2024-03-02 NOTE — Telephone Encounter (Signed)
Pt enrolled in [employer]. Order placed.  

## 2024-03-02 NOTE — Telephone Encounter (Signed)
-----   Message from Cadence Rebekah Canada sent at 03/02/2024 10:59 AM EDT ----- Regarding: 30 day heart monitor Patient needs 30 day heart monitor for stroke. Can also schedule hospital follow-up in 2 months.

## 2024-03-02 NOTE — Discharge Summary (Signed)
 Physician Discharge Summary   Patient: Angela Rose MRN: [id number] DOB: October 08, 1946  Admit date:     02/29/2024  Discharge date: 03/02/24  Discharge Physician: Veronica Gordon   PCP: Tobi Fortes, MD   Recommendations at discharge:   Follow-up with ophthalmology on 03/02/2024 Follow-up with neurology Follow-up with PCP Follow-up with cardiology as scheduled  Discharge Diagnoses: Principal Problem:   Stroke [facility]) Active Problems:   Essential (primary) hypertension   Type II diabetes mellitus with complication (HCC)   Hyperlipidemia   Chronic kidney disease, stage 3b (HCC)   CHF (congestive heart failure), NYHA class II, acute on chronic, combined (HCC)   CAD (coronary artery disease)    Hospital Course: Angela Rose is a 78 y.o. female with a hx of MI in 1993 s/p stent, CABG 1996, HTN, HLD, PAD, former smoker, carotid atherosclerosis, chronic systolic CHF, presents to ED secondary to complaints of painless loss of vision in the upper field of right eye started around 11:30 am on 02/29/24. Initially, pt went to urgent care for which she was instructed to go to ED. Denied any other focal neurologic deficits.  In the ED, vital signs fairly stable, except with heart rates in the 50s.  Labs showed mild hyponatremia, creatinine 1.1, A1c 7.4. CTA head and neck showed significant small vessel disease, but no LVO, CT head with no intracranial abnormality, only significant for calcified nodule in the left thyroid  lobe around 2 cm.  Of note, patient is currently on a Librexia ACS study by cardiology (Milvexian 25 mg Vs placebo).  Patient was also on aspirin  and Plavix , of which ASA was recently discontinued by her cardiologist about a week ago due to risk of bleeding.  [facility] hospitalist admitted for further management.     Today, patient denies any new complaints.  Reports persistent visual field defects, reports as blurry.  Patient denies any chest pain, shortness of breath,  abdominal pain, nausea/vomiting, fever/chills.  Patient advised to follow-up with ophthalmology of which she got an appointment with Dr. Roslynn Coombes on 5/23 in [place].  Daughter and husband will be taking her straight there upon discharge.    Assessment and Plan:  Right eye visual field defect Likely branch retinal artery occlusion Acute CVA ruled out CTA head and neck significant for multiple intracranial vascular severe stenosis, and atherosclerosis at carotid purification without high-grade stenosis, but no acute CVA MRI brain with no acute intracranial finding ECHO with EF of 50 to 55%, improved from previous, noted regional wall motion abnormality (noted on previous) A1c 7.4, LDL 40  PT, OT, SLP-no further recommendation Neurology consulted, recommend aspirin , Plavix , 30-day heart monitor (discussed with cardiology who will set it up), outpatient follow-up with neurology Neurology also recommended outpatient ophthalmology appointment which is scheduled for today 03/02/2024 Continue home Plavix , restart aspirin  81 mg daily Discussed with PI from research Dr Judy Null on 03/01/2024, recommended hold Milvexian which is part of the Libraxa ACS study by cardiology   CAD Ischemic cardiomyopathy Chronic systolic CHF Hypertension Continue Entresto , Aldactone  and Toprol -XL   Hyperlipidemia Continue with home medications, she is on Zetia  and Repatha  LDL 40   Hypothyroidism Continue with Synthroid    Incidental finding of thyroid  nodule Routine thyroid  ultrasound as an outpatient PCP to follow-up   Diabetes mellitus Last A1c 7.4 Continue home regimen        Consultants: Neurology Procedures performed: None Disposition: Home    Diet recommendation:  Discharge Diet Orders (From admission, onward)     Start  Ordered   03/02/24 0000  Diet - low sodium heart healthy        03/02/24 1051           Cardiac and Carb modified diet DISCHARGE MEDICATION: Allergies as of  03/02/2024       Reactions   Propoxyphene Nausea And Vomiting   Lidocaine -epinephrine  (pf) Palpitations, Other (See Comments)   Chest pain   Other Other (See Comments)   Sutures - in her hand - very irritated   Statins Other (See Comments)   Muscle pain and weakness in legs   Tape Rash   Bandaids   Xylocaine   [lidocaine  Hcl] Palpitations, Other (See Comments)   Chest pain   Jardiance  [empagliflozin ] Other (See Comments)   Recurrent vaginal infections        Medication List     PAUSE taking these medications    LIBREXIA-ACS milvexian or placebo 25 mg tablet Wait to take this until your doctor or other care provider tells you to start again. Take 1 tablet by mouth 2 (two) times daily. Take 1 tablet by mouth twice a day with or without food. Bring bottle back to every visit. Please contact [facility] for any questions or concerns regarding the medication. For Investigational Use Only.       TAKE these medications    Accu-Chek Guide Me w/Device Kit Use to check blood sugars twice a day , before breakfast and before bed.   aspirin  EC 81 MG tablet Take 1 tablet (81 mg total) by mouth daily. Swallow whole.   BD Pen Needle Nano U/F 32G X 4 MM Misc Generic drug: Insulin  Pen Needle Use to check glucose once daily   ACCU-CHEK GUIDE TEST VI by In Vitro route.   BLOOD GLUCOSE TEST STRIPS Strp Use to check glucose twice daily   Accu-Chek Guide Test test strip Generic drug: glucose blood Use to check blood sugar before breakfast and before bed.   clopidogrel  75 MG tablet Commonly known as: PLAVIX  Take 1 tablet (75 mg total) by mouth daily.   Entresto  24-26 MG Generic drug: sacubitril -valsartan  TAKE 1 TABLET BY MOUTH TWICE DAILY   ezetimibe  10 MG tablet Commonly known as: Zetia  Take 1 tablet (10 mg total) by mouth daily.   FLUoxetine  40 MG capsule Commonly known as: PROZAC  TAKE 1 CAPSULE(40 MG) BY MOUTH DAILY   furosemide  40 MG tablet Commonly known as:  LASIX  TAKE 1 TABLET BY MOUTH AS NEEDED FOR EDEMA(3 LB WEIGHT GAIN OVERNIGHT) What changed: See the new instructions.   levothyroxine  50 MCG tablet Commonly known as: SYNTHROID  Take 1 tablet (50 mcg total) by mouth daily before breakfast. What changed: when to take this   metFORMIN  500 MG 24 hr tablet Commonly known as: GLUCOPHAGE -XR Take 1 tablet (500 mg total) by mouth daily with breakfast. Resume on 01/18/23   metoprolol  succinate 25 MG 24 hr tablet Commonly known as: TOPROL -XL Take 1 tablet (25 mg total) by mouth daily.   nitroGLYCERIN  0.4 MG SL tablet Commonly known as: Nitrostat  Place 1 tablet (0.4 mg total) under the tongue every 5 (five) minutes as needed for chest pain.   Repatha  SureClick 140 MG/ML Soaj Generic drug: Evolocumab  Inject 140 mg into the skin every 14 (fourteen) days.   spironolactone  25 MG tablet Commonly known as: ALDACTONE  TAKE 1/2 TABLET(12.5 MG) BY MOUTH DAILY   Tresiba  FlexTouch 100 UNIT/ML FlexTouch Pen Generic drug: insulin  degludec Inject 12 Units into the skin at bedtime. DX Code E11.8 , Z79.4  Vitamin D3 50 MCG (2000 UT) capsule Take 2,000 Units by mouth daily.        Follow-up Information     Rudine Cos, MD. Call today.   Specialty: Ophthalmology Why: Call Dr Elden Greenhouse office ASAP to follow up on your blurry vision Contact information: [address] [place] [address] [phone]         Tobi Fortes, MD. Schedule an appointment as soon as possible for a visit in 1 week(s).   Specialty: Internal Medicine Contact information: [address] [place] [address] [phone]                Discharge Exam: Cleavon Curls Weights   02/29/24 2328  Weight: 75.4 kg   General: NAD  Cardiovascular: S1, S2 present Respiratory: CTAB Abdomen: Soft, nontender, nondistended, bowel sounds present Musculoskeletal: No bilateral pedal edema noted Skin: Normal Psychiatry: Normal mood   Condition at discharge:  stable  The results of significant diagnostics from this hospitalization (including imaging, microbiology, ancillary and laboratory) are listed below for reference.   Imaging Studies: ECHOCARDIOGRAM COMPLETE Result Date: 03/01/2024    ECHOCARDIOGRAM REPORT   Patient Name:   GAE BIHL Date of Exam: 03/01/2024 Medical Rec #:  [id number]            Height:       66.0 in Accession #:    [id number]           Weight:       166.2 lb Date of Birth:  29-Dec-1945            BSA:          1.849 m Patient Age:    78 years             BP:           134/52 mmHg Patient Gender: F                    HR:           58 bpm. Exam Location:  [facility] Procedure: 2D Echo, Color Doppler and Cardiac Doppler (Both Spectral and Color            Flow Doppler were utilized during procedure). Indications:    Stroke  History:        Patient has prior history of Echocardiogram examinations, most                 recent 04/20/2023.  Sonographer:    Andrena Bang Referring Phys: 2 DAWOOD S ELGERGAWY IMPRESSIONS  1. Inferior basal akinesis . Left ventricular ejection fraction, by estimation, is 50 to 55%. The left ventricle has low normal function. The left ventricle demonstrates regional wall motion abnormalities (see scoring diagram/findings for description). The left ventricular internal cavity size was mildly dilated. Left ventricular diastolic parameters were normal.  2. Right ventricular systolic function is normal. The right ventricular size is normal.  3. Cannot r/o small PFO consider bubble study if clinically indicated.  4. The mitral valve is abnormal. Mild mitral valve regurgitation. No evidence of mitral stenosis. Moderate mitral annular calcification.  5. The aortic valve is tricuspid. There is moderate calcification of the aortic valve. There is moderate thickening of the aortic valve. Aortic valve regurgitation is not visualized. Mild aortic valve stenosis.  6. The inferior vena cava is normal in size with greater than  50% respiratory variability, suggesting right atrial pressure of 3 mmHg. FINDINGS  Left  Ventricle: Inferior basal akinesis. Left ventricular ejection fraction, by estimation, is 50 to 55%. The left ventricle has low normal function. The left ventricle demonstrates regional wall motion abnormalities. Strain was performed and the global  longitudinal strain is indeterminate. The left ventricular internal cavity size was mildly dilated. There is no left ventricular hypertrophy. Left ventricular diastolic parameters were normal. Right Ventricle: The right ventricular size is normal. No increase in right ventricular wall thickness. Right ventricular systolic function is normal. Left Atrium: Left atrial size was normal in size. Right Atrium: Right atrial size was normal in size. Pericardium: There is no evidence of pericardial effusion. Mitral Valve: The mitral valve is abnormal. There is mild thickening of the mitral valve leaflet(s). There is mild calcification of the mitral valve leaflet(s). Moderate mitral annular calcification. Mild mitral valve regurgitation. No evidence of mitral  valve stenosis. Tricuspid Valve: The tricuspid valve is normal in structure. Tricuspid valve regurgitation is not demonstrated. No evidence of tricuspid stenosis. Aortic Valve: The aortic valve is tricuspid. There is moderate calcification of the aortic valve. There is moderate thickening of the aortic valve. Aortic valve regurgitation is not visualized. Mild aortic stenosis is present. Aortic valve mean gradient measures 4.0 mmHg. Aortic valve peak gradient measures 7.2 mmHg. Aortic valve area, by VTI measures 1.57 cm. Pulmonic Valve: The pulmonic valve was normal in structure. Pulmonic valve regurgitation is not visualized. No evidence of pulmonic stenosis. Aorta: The aortic root is normal in size and structure. Venous: The inferior vena cava is normal in size with greater than 50% respiratory variability, suggesting right atrial  pressure of 3 mmHg. IAS/Shunts: The interatrial septum was not well visualized. Additional Comments: 3D was performed not requiring image post processing on an independent workstation and was indeterminate.  LEFT VENTRICLE PLAX 2D LVIDd:         5.00 cm      Diastology LVIDs:         3.80 cm      LV e' medial:  7.07 cm/s LV PW:         0.90 cm      LV e' lateral: 6.74 cm/s LV IVS:        1.10 cm LVOT diam:     1.90 cm LV SV:         52 LV SV Index:   28 LVOT Area:     2.84 cm  LV Volumes (MOD) LV vol d, MOD A2C: 126.0 ml LV vol d, MOD A4C: 111.0 ml LV vol s, MOD A2C: 49.9 ml LV vol s, MOD A4C: 49.7 ml LV SV MOD A2C:     76.1 ml LV SV MOD A4C:     111.0 ml LV SV MOD BP:      68.5 ml RIGHT VENTRICLE RV S prime:     7.07 cm/s TAPSE (M-mode): 1.2 cm LEFT ATRIUM             Index LA diam:        3.50 cm 1.89 cm/m LA Vol (A2C):   55.1 ml 29.81 ml/m LA Vol (A4C):   73.6 ml 39.81 ml/m LA Biplane Vol: 64.0 ml 34.62 ml/m  AORTIC VALVE AV Area (Vmax):    1.67 cm AV Area (Vmean):   1.47 cm AV Area (VTI):     1.57 cm AV Vmax:           134.00 cm/s AV Vmean:          91.500 cm/s AV VTI:  0.333 m AV Peak Grad:      7.2 mmHg AV Mean Grad:      4.0 mmHg LVOT Vmax:         79.10 cm/s LVOT Vmean:        47.300 cm/s LVOT VTI:          0.184 m LVOT/AV VTI ratio: 0.55  AORTA Ao Root diam: 3.30 cm  SHUNTS Systemic VTI:  0.18 m Systemic Diam: 1.90 cm Janelle Mediate MD Electronically signed by Janelle Mediate MD Signature Date/Time: 03/01/2024/12:34:03 PM    Final    MR BRAIN WO CONTRAST Result Date: 03/01/2024 CLINICAL DATA:  Provided history: Stroke. EXAM: MRI HEAD WITHOUT CONTRAST TECHNIQUE: Multiplanar, multiecho pulse sequences of the brain and surrounding structures were obtained without intravenous contrast. COMPARISON:  Non-contrast head CT and CT angiogram head/neck 02/29/2024. FINDINGS: Brain: Generalized cerebral atrophy, mild for age. Multifocal T2 FLAIR hyperintense signal abnormality within the cerebral white  matter and pons, nonspecific but compatible with mild chronic small vessel ischemic disease. Several nonspecific punctate chronic microhemorrhages scattered within the supratentorial and infratentorial brain. No cortical encephalomalacia is identified. There is no acute infarct. No evidence of an intracranial mass. No extra-axial fluid collection. No midline shift. Vascular: Maintained flow voids within the proximal large arterial vessels. Skull and upper cervical spine: No focal worrisome marrow lesion. Sinuses/Orbits: No mass or acute finding within the imaged orbits. Prior bilateral ocular lens replacement. No significant paranasal sinus disease. IMPRESSION: 1.  No acute intracranial finding. 2. Mild chronic small vessel ischemic changes within the cerebral white matter and pons. 3. Several nonspecific punctate chronic microhemorrhages within the supratentorial and infratentorial brain. Electronically Signed   By: Bascom Lily D.O.   On: 03/01/2024 11:32   CT ANGIO HEAD NECK W WO CM Result Date: 02/29/2024 CLINICAL DATA:  Neuro deficit, concern for stroke, sudden loss of vision in right eye this morning. EXAM: CT ANGIOGRAPHY HEAD AND NECK WITH AND WITHOUT CONTRAST TECHNIQUE: Multidetector CT imaging of the head and neck was performed using the standard protocol during bolus administration of intravenous contrast. Multiplanar CT image reconstructions and MIPs were obtained to evaluate the vascular anatomy. Carotid stenosis measurements (when applicable) are obtained utilizing NASCET criteria, using the distal internal carotid diameter as the denominator. RADIATION DOSE REDUCTION: This exam was performed according to the departmental dose-optimization program which includes automated exposure control, adjustment of the mA and/or kV according to patient size and/or use of iterative reconstruction technique. CONTRAST:  75mL OMNIPAQUE  IOHEXOL  350 MG/ML SOLN COMPARISON:  CT head 07/13/2022. FINDINGS: CT HEAD  FINDINGS Brain: No acute intracranial hemorrhage. No CT evidence of acute infarct. No edema, mass effect, or midline shift. The basilar cisterns are patent. Ventricles: Ventricles are normal in size and configuration. Vascular: No hyperdense vessel. Intracranial atherosclerotic calcifications noted. Skull: No acute or aggressive finding. Sinuses/orbits: The visualized paranasal sinuses are clear. Orbits are symmetric. Other: Mastoid air cells are clear. CTA NECK FINDINGS Aortic arch: Standard configuration of the aortic arch. Imaged portion shows no evidence of aneurysm or dissection. Mild atherosclerosis. No significant stenosis of the major arch vessel origins. Pulmonary arteries: As permitted by contrast timing, there are no filling defects in the visualized pulmonary arteries. Subclavian arteries: The subclavian arteries are patent bilaterally. Right carotid system: Patent. Mild atherosclerosis along the common carotid artery. Mild atherosclerosis at the carotid bifurcation without hemodynamically significant stenosis. No evidence of dissection. Left carotid system: No evidence of dissection, stenosis (50% or greater), or occlusion. Mild atherosclerosis along  the common carotid artery. Vertebral arteries: Codominant. Limited evaluation of the bilateral V1 segments due to adjacent dense venous contrast. Atherosclerosis of the bilateral V4 segments. Focal severe stenosis of the right V4 segment. Mild stenosis of the left V4 segment. No evidence of dissection. Skeleton: No acute findings. Degenerative changes in the cervical spine. Partially visualized sequelae of median sternotomy. Other neck: The visualized airway is patent. No cervical lymphadenopathy. Partially calcified nodule in the left thyroid  lobe which measures approximately 2 cm in diameter. Upper chest: Visualized lung apices are clear. Review of the MIP images confirms the above findings CTA HEAD FINDINGS ANTERIOR CIRCULATION: The intracranial ICAs are  patent bilaterally. Atherosclerosis of the bilateral carotid siphons. Moderate stenosis of the bilateral supraclinoid ICAs. No proximal occlusion, aneurysm, or vascular malformation. MCAs: The right M1 segment is patent. Focal severe stenosis of the mid right M1 segment without evidence of occlusion. ACAs: Patent bilaterally. Moderate stenosis of the proximal left A1 segment. Additional severe stenosis of the A3 segment left ACA. POSTERIOR CIRCULATION: No significant stenosis, proximal occlusion, aneurysm, or vascular malformation. PCAs: Patent bilaterally. Moderate stenosis of the P2 a segment right PCA. Additional moderate stenosis of the P2 a segment left PCA. Pcomm: The posterior communicating arteries are visualized bilaterally. SCAs: The superior cerebellar arteries are patent bilaterally. Basilar artery: Patent AICAs: Patent PICAs: Not well visualized. Vertebral arteries: As above. Venous sinuses: As permitted by contrast timing, patent. Anatomic variants: None Review of the MIP images confirms the above findings IMPRESSION: No large vessel occlusion. Multiple intracranial vascular stenoses as above. Severe stenosis of the mid M1 segment right MCA. Additional severe stenosis of the A3 segment left ACA. Moderate stenosis of the P2 segments of the bilateral PCAs. Atherosclerosis of the V4 segment right vertebral artery resulting in severe stenosis. Atherosclerosis at the carotid bifurcations without high-grade stenosis. No CT evidence of acute intracranial abnormality. Partially calcified nodule in the left thyroid  lobe likely measuring around 2 cm in diameter. Recommend correlation with nonemergent thyroid  ultrasound. Aortic Atherosclerosis (ICD10-I70.0). Electronically Signed   By: Denny Flack M.D.   On: 02/29/2024 19:17   DG Bone Density Result Date: 02/29/2024 EXAM: DUAL X-RAY ABSORPTIOMETRY (DXA) FOR BONE MINERAL DENSITY 02/29/2024 11:23 am CLINICAL DATA:  78 year old Female Postmenopausal. Post  menopausal estrogen deficient History of fragility fracture. Patient is or has been on bone building therapies. TECHNIQUE: An axial (e.g., hips, spine) and/or appendicular (e.g., radius) exam was performed, as appropriate, using GE Psychologist, sport and exercise at [facility]. Images are obtained for bone mineral density measurement and are not obtained for diagnostic purposes. [id number] Exclusions: Lumbar spine due to advanced degenerative changes COMPARISON:  None. New baseline. FINDINGS: Scan quality: Good. LEFT FEMORAL NECK: BMD (in g/cm2): 0.842 T-score: -1.4 Z-score: 0.8 LEFT TOTAL HIP: BMD (in g/cm2): 0.847 T-score: -1.3 Z-score: 0.7 RIGHT FEMORAL NECK: BMD (in g/cm2): 0.876 T-score: -1.2 Z-score: 1.0 RIGHT TOTAL HIP: BMD (in g/cm2): 0.860 T-score: -1.2 Z-score: 0.8 LEFT FOREARM (RADIUS 33%): BMD (in g/cm2): 0.647 T-score: -0.8 Z-score: 1.9 FRAX 10-YEAR PROBABILITY OF FRACTURE: 10-year fracture risk is performed using the [facility] FRAX calculator based on patient-reported risk factors. Major osteoporotic fracture: 31.1% Hip fracture: 16.5% Other situations known to alter the reliability of the FRAX score should be considered when making treatment decisions, including chronic glucocorticoid use and past treatments. Further guidance on treatment can be found at the [facility] Osteoporosis Foundation's website [email or web address] IMPRESSION: Osteopenia based on BMD. Fracture risk is unknown due to history of  bone building therapy. RECOMMENDATIONS: 1. All patients should optimize calcium  and vitamin D  intake. 2. Consider FDA-approved medical therapies in postmenopausal women and men aged 24 years and older, based on the following: - A hip or vertebral (clinical or morphometric) fracture - T-score less than or equal to -2.5 and secondary causes have been excluded. - Low bone mass (T-score between -1.0 and -2.5) and a 10-year probability of a hip fracture greater than or equal to 3% or a 10-year  probability of a major osteoporosis-related fracture greater than or equal to 20% based on the [place] -adapted WHO algorithm. - Clinician judgment and/or patient preferences may indicate treatment for people with 10-year fracture probabilities above or below these levels 3. Patients with diagnosis of osteoporosis or at high risk for fracture should have regular bone mineral density tests. For patients eligible for Medicare, routine testing is allowed once every 2 years. The testing frequency can be increased to one year for patients who have rapidly progressing disease, those who are receiving or discontinuing medical therapy to restore bone mass, or have additional risk factors. Electronically Signed   By: Dina  Arceo M.D.   On: 02/29/2024 13:39   US  RENAL Result Date: 02/20/2024 CLINICAL DATA:  Stage 3 chronic kidney disease, questioned area. EXAM: RENAL / URINARY TRACT ULTRASOUND COMPLETE COMPARISON:  CT abdomen pelvis May 01, 2018 FINDINGS: Right Kidney: Renal measurements: 11.2 x 5.3 x 4.9 cm = volume: 152.7 mL. Echogenicity within normal limits. No hydronephrosis visualized. Simple cysts are identified within the right kidney, largest measures 2.4 x 3.3 x 2.5 cm. No follow-up is recommended. Left Kidney: Renal measurements: 10.3 x 6.1 x 5.5 cm = volume: 180.3 mL. Echogenicity within normal limits. No mass or hydronephrosis visualized. Bladder: Appears normal for degree of bladder distention. Bilateral ureteral jets are noted. Pre void volume of 268.1 cc, postvoid volume of 56.3 cc. Other: None. IMPRESSION: No acute abnormality identified. No hydronephrosis bilaterally. Electronically Signed   By: Anna Barnes M.D.   On: 02/20/2024 16:10    Microbiology: Results for orders placed or performed in visit on 12/26/23  Microscopic Examination     Status: Abnormal   Collection Time: 12/27/23 12:00 PM  Result Value Ref Range Status   WBC, UA >30 (A) 0 - 5 /hpf Final    Comment: Clumps of leukocytes present.    RBC, Urine None seen 0 - 2 /hpf Final   Epithelial Cells (non renal) None seen 0 - 10 /hpf Final   Casts None seen None seen /lpf Final   Bacteria, UA Many (A) None seen/Few Final  Urine Culture, Reflex     Status: Abnormal   Collection Time: 12/27/23 12:00 PM  Result Value Ref Range Status   Urine Culture, Routine Final report (A)  Final   Organism ID, Bacteria Klebsiella oxytoca (A)  Final    Comment: Greater than 100,000 colony forming units per mL   Antimicrobial Susceptibility Comment  Final    Comment:       ** S = Susceptible; I = Intermediate; R = Resistant **                    P = Positive; N = Negative             MICS are expressed in micrograms per mL    Antibiotic                 RSLT#1    RSLT#2    RSLT#3  RSLT#4 Amoxicillin /Clavulanic Acid    S Ampicillin                     R Cefepime                       S Cefoxitin                      S Cefpodoxime                    S Ceftriaxone                    S Ciprofloxacin                   S Ertapenem                      S Gentamicin                     S Levofloxacin                   S Meropenem                      S Nitrofurantoin                  S Tetracycline                   S Tobramycin                     S Trimethoprim /Sulfa              S     Labs: CBC: Recent Labs  Lab 02/29/24 1852 03/01/24 0451 03/02/24 0505  WBC 7.1 6.4 6.4  NEUTROABS 4.3  --   --   HGB 11.7* 11.5* 12.2  HCT 35.4* 36.5 37.0  MCV 91.2 92.6 93.0  PLT 276 247 253   Basic Metabolic Panel: Recent Labs  Lab 02/29/24 1852 03/01/24 0451 03/02/24 0505  NA 131* 136 136  K 4.0 3.8 4.2  CL 101 106 106  CO2 24 23 25   GLUCOSE 102* 118* 124*  BUN 26* 28* 27*  CREATININE 1.13* 1.14* 1.19*  CALCIUM  8.3* 8.3* 8.1*   Liver Function Tests: No results for input(s): "AST", "ALT", "ALKPHOS", "BILITOT", "PROT", "ALBUMIN" in the last 168 hours. CBG: Recent Labs  Lab 03/01/24 0804 03/01/24 1117 03/01/24 1653 03/01/24 2007  03/02/24 0758  GLUCAP 110* 233* 216* 176* 130*    Discharge time spent: greater than 30 minutes.  Signed: Veronica Gordon, MD [facility] 03/02/2024

## 2024-03-05 DIAGNOSIS — N1832 Chronic kidney disease, stage 3b: Secondary | ICD-10-CM | POA: Diagnosis not present

## 2024-03-05 DIAGNOSIS — E875 Hyperkalemia: Secondary | ICD-10-CM | POA: Diagnosis not present

## 2024-03-05 DIAGNOSIS — I5042 Chronic combined systolic (congestive) and diastolic (congestive) heart failure: Secondary | ICD-10-CM | POA: Diagnosis not present

## 2024-03-05 DIAGNOSIS — I2510 Atherosclerotic heart disease of native coronary artery without angina pectoris: Secondary | ICD-10-CM | POA: Diagnosis not present

## 2024-03-06 ENCOUNTER — Telehealth: Payer: Self-pay

## 2024-03-06 DIAGNOSIS — Z006 Encounter for examination for normal comparison and control in clinical research program: Secondary | ICD-10-CM

## 2024-03-06 LAB — GLUCOSE, CAPILLARY: Glucose-Capillary: 227 mg/dL — ABNORMAL HIGH (ref 70–99)

## 2024-03-06 NOTE — Patient Instructions (Signed)
 Visit Information  Thank you for taking time to visit with me today. Please don't hesitate to contact me if I can be of assistance to you before our next scheduled telephone appointment.  Our next appointment is by telephone on 03/13/24 at 11aam  Following is a copy of your care plan:   Goals Addressed             This Visit's Progress    VBCI Transitions of Care (TOC) Care Plan       Problems:  Recent Hospitalization for treatment of Stroke - blurry vision right eye Patient has multiple MD follow up appointments - at beginning of call stated she is overwhelmed - reviewed with patient  Goal:  Over the next 30 days, the patient will not experience hospital readmission  Interventions:  Transitions of Care: Doctor Visits  - discussed the importance of doctor visits Reviewed all medications - 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 and appears to have a good understanding of diet - "rinses the canned pinto beans" Reviewed ophthalmology appointment that patient reports she completed day of discharge 03/02/24 with Dr Roslynn Coombes related to blurry vision    Diabetes Interventions: Assessed patient's understanding of A1c goal: <6.5% Reviewed medications with patient and discussed importance of medication adherence Discussed plans with patient for ongoing care management follow up and provided patient with direct contact information for care management team Reviewed scheduled/upcoming provider appointments including: PCP, Dr Saint Cranker - 03/08/24 - patient to call and make them aware this needs to be a hospital follow up, Patient also with cardiology appointment 03/07/24 Ervin Heath P.A. and Neurology [facility] Lab Results  Component  Value Date   HGBA1C 7.4 (H) 02/29/2024    Patient Self Care Activities:  Attend all scheduled provider appointments Call pharmacy for medication refills 3-7 days in advance of running out of medications Call provider office for new concerns or questions  Notify RN Care Manager of [facility] call rescheduling needs Participate in Transition of Care Program/Attend TOC scheduled calls Take medications as prescribed   take the blood sugar log to all doctor visits Seek medical attention immediately with any stroke symptoms including worsening blurred vision, weakness, facial droop, difficulty speaking  Plan:  Telephone follow up appointment with care management team member scheduled for:  03/13/24 11am with primary TOC RN, Orpha Blade  The patient has been provided with contact information for the care management team and has been advised to call with any health related questions or concerns.         Patient verbalizes understanding of instructions and care plan provided today and agrees to view in MyChart. Active MyChart status and patient understanding of how to access instructions and care plan via MyChart confirmed with patient.     Telephone follow up appointment with care management team member scheduled for:03/13/24 11am with Orpha Blade, RN The patient has been provided with contact information for the care management team and has been advised to call with any health related questions or concerns.   Please call the care guide team at [phone] if you need to cancel or reschedule your appointment.   Please call the Suicide and Crisis Lifeline: 988 call 1-800-273-TALK (toll free, 24 hour hotline) call  911 if you are experiencing a Mental Health or Behavioral Health Crisis or need someone to talk to.  Tonia Frankel RN, CCM [place]  VBCI-Population Health RN Care Manager [phone]

## 2024-03-06 NOTE — Research (Signed)
 Called and spoke to patient about ophthalmology appt. Patient states it is okay for us  to contact ophthalmology and request records of visit. Instructed patient to hold study IP for Librexia trial until we receive results of ophthalmology consult. Pt verbalized understanding.

## 2024-03-06 NOTE — Transitions of Care (Post Inpatient/ED Visit) (Signed)
 03/06/2024  Name: Angela Rose MRN: [id number] DOB: 1946/08/20  Today's TOC FU Call Status: Today's TOC FU Call Status:: Successful TOC FU Call Completed TOC FU Call Complete Date: 03/06/24 Patient's Name and Date of Birth confirmed.  Transition Care Management Follow-up Telephone Call Date of Discharge: 03/02/24 Discharge [facility] Type of Discharge: Inpatient Admission Primary Inpatient Discharge Diagnosis:: Stroke- CTA head and neck significant for multiple intracranial vascular severe stenosis, and atherosclerosis at carotid purification without high-grade stenosis, but no acute CVA How have you been since you were released from the hospital?: Better Any questions or concerns?: No  Items Reviewed: Did you receive and understand the discharge instructions provided?: Yes Medications obtained,verified, and reconciled?: Yes (Medications Reviewed) Any new allergies since your discharge?: No Dietary orders reviewed?: Yes Type of Diet Ordered:: low sodium heart healthy Do you have support at home?: Yes People in Home [RPT]: significant other Name of Support/Comfort Primary Source: patient refers to her significant other of 22 years as her husband and states he assists as needed  Medications Reviewed Today: Medications Reviewed Today     Reviewed by Sharmaine Dearth, RN (Registered Nurse) on 03/06/24 at 1453  Med List Status: <None>   Medication Order Taking? Sig Documenting Provider Last Dose Status Informant  aspirin  EC 81 MG tablet [id number] Yes Take 1 tablet (81 mg total) by mouth daily. Swallow whole. Ezenduka, Nkeiruka J, MD Taking Active   Blood Glucose Monitoring Suppl (ACCU-CHEK GUIDE ME) w/Device KIT [id number] Yes Use to check blood sugars twice a day , before breakfast and before bed. Wendel Hals, NP Taking Active Self, Spouse/Significant Other  Cholecalciferol (VITAMIN D3) 50 MCG (2000 UT) capsule [id number] Yes Take 2,000 Units by mouth  daily. [provider] Taking Active Self, Spouse/Significant Other  clopidogrel  (PLAVIX ) 75 MG tablet [id number] Yes Take 1 tablet (75 mg total) by mouth daily. Bridgette Campus, MD Taking Active Self, Spouse/Significant Other  ezetimibe  (ZETIA ) 10 MG tablet [id number] Yes Take 1 tablet (10 mg total) by mouth daily. Knox Perl, MD Taking Active Self, Spouse/Significant Other  FLUoxetine  (PROZAC ) 40 MG capsule [id number] Yes TAKE 1 CAPSULE(40 MG) BY MOUTH DAILY Dixon, Phillip E, MD Taking Active Self, Spouse/Significant Other  furosemide  (LASIX ) 40 MG tablet [id number] Yes TAKE 1 TABLET BY MOUTH AS NEEDED FOR EDEMA(3 LB WEIGHT GAIN OVERNIGHT)  Patient taking differently: Take 20 mg by mouth daily as needed for fluid or edema (weight gain over 3 lb overnight).   Bridgette Campus, MD Taking Active Self, Spouse/Significant Other  Glucose Blood (ACCU-CHEK GUIDE TEST VI) [id number] Yes by In Vitro route. [provider] Taking Active Self, Spouse/Significant Other  glucose blood (ACCU-CHEK GUIDE TEST) test strip [id number] Yes Use to check blood sugar before breakfast and before bed. Wendel Hals, NP Taking Active Self, Spouse/Significant Other  Glucose Blood (BLOOD GLUCOSE TEST STRIPS) STRP [id number] Yes Use to check glucose twice daily Wendel Hals, NP Taking Active Self, Spouse/Significant Other  insulin  degludec (TRESIBA  FLEXTOUCH) 100 UNIT/ML FlexTouch Pen [id number] Yes Inject 12 Units into the skin at bedtime. DX Code E11.8 , Z79.4 Wendel Hals, NP Taking Active Self, Spouse/Significant Other  Insulin  Pen Needle (BD PEN NEEDLE NANO U/F) 32G X 4 MM MISC [id number] Yes Use to check glucose once daily Wendel Hals, NP Taking Active Self, Spouse/Significant Other  levothyroxine  (SYNTHROID ) 50 MCG tablet [id number] Yes Take 1 tablet (50 mcg total) by mouth daily before breakfast.  Patient taking differently: Take  50 mcg by mouth at bedtime.   Wendel Hals, NP Taking  Active Self, Spouse/Significant Other           Med Note (WARD, ANGELICA G   Wed Feb 29, 2024  8:07 PM)    metFORMIN  (GLUCOPHAGE -XR) 500 MG 24 hr tablet [id number] Yes Take 1 tablet (500 mg total) by mouth daily with breakfast. Resume on 01/18/23 Wendel Hals, NP Taking Active Self, Spouse/Significant Other  metoprolol  succinate (TOPROL -XL) 25 MG 24 hr tablet [id number] Yes Take 1 tablet (25 mg total) by mouth daily. Bridgette Campus, MD Taking Active Self, Spouse/Significant Other  nitroGLYCERIN  (NITROSTAT ) 0.4 MG SL tablet [id number] Yes Place 1 tablet (0.4 mg total) under the tongue every 5 (five) minutes as needed for chest pain. Knox Perl, MD Taking Active Self, Spouse/Significant Other  REPATHA  SURECLICK 140 MG/ML SOAJ [id number] Yes Inject 140 mg into the skin every 14 (fourteen) days. Tobi Fortes, MD Taking Active Self, Spouse/Significant Other  sacubitril -valsartan  (ENTRESTO ) 24-26 MG [id number] Yes TAKE 1 TABLET BY MOUTH TWICE DAILY Branch, Tomas Fountain, MD Taking Active Self, Spouse/Significant Other  spironolactone  (ALDACTONE ) 25 MG tablet [id number] Yes TAKE 1/2 TABLET(12.5 MG) BY MOUTH DAILY Branch, Tomas Fountain, MD Taking Active Self, Spouse/Significant Other  Study - LIBREXIA-ACS - milvexian 25 mg or placebo tablet (PI-Stuckey) [id number] No Take 1 tablet by mouth 2 (two) times daily. Take 1 tablet by mouth twice a day with or without food. Bring bottle back to every visit. Please contact [facility] for any questions or concerns regarding the medication. For Investigational Use Only.  Patient not taking: Reported on 03/06/2024   Kristopher Pheasant, MD Not Taking Active Self, Spouse/Significant Other            Home Care and Equipment/Supplies: Were Home Health Services Ordered?: No Any new equipment or medical supplies ordered?: No  Functional Questionnaire: Do you need assistance with bathing/showering or dressing?: No Do you need assistance with meal preparation?: No Do you  need assistance with eating?: No Do you have difficulty maintaining continence: Yes (wears depends) Do you need assistance with getting out of bed/getting out of a chair/moving?: No Do you have difficulty managing or taking your medications?: No  Follow up appointments reviewed: PCP Follow-up appointment confirmed?: Yes Date of PCP follow-up appointment?: 03/08/24 Follow-up Provider: Saint Cranker Specialist [facility] Follow-up appointment confirmed?: Yes Date of Specialist follow-up appointment?: 03/07/24 Follow-Up Specialty Provider:: Cardiology, Ervin Heath P.A Do you need transportation to your follow-up appointment?: No (husband/or daughter will drive) Do you understand care options if your condition(s) worsen?: Yes-patient verbalized understanding  SDOH Interventions Today    Flowsheet Row Most Recent Value  SDOH Interventions   Food Insecurity Interventions Intervention Not Indicated  [patient states she and her significant other both get food stamps]  Housing Interventions Intervention Not Indicated  Transportation Interventions Intervention Not Indicated  Utilities Interventions Intervention Not Indicated       Goals Addressed             This Visit's Progress    VBCI Transitions of Care (TOC) Care Plan       Problems:  Recent Hospitalization for treatment of Stroke - blurry vision right eye Patient has multiple MD follow up appointments - at beginning of call stated she is overwhelmed - reviewed with patient  Goal:  Over the next 30 days, the patient will not experience hospital readmission  Interventions:  Transitions of Care: Doctor Visits  - discussed the importance of doctor  visits Reviewed all medications - 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  Reviewed low sodium heart healthy diet/patient with  history of MI in 1993 with stent and CABG 1996 and appears to have a good understanding of diet - "rinses the canned pinto beans" Reviewed ophthalmology appointment that patient reports she completed day of discharge 03/02/24 with Dr Roslynn Coombes related to blurry vision    Diabetes Interventions: Assessed patient's understanding of A1c goal: <6.5% Reviewed medications with patient and discussed importance of medication adherence Discussed plans with patient for ongoing care management follow up and provided patient with direct contact information for care management team Reviewed scheduled/upcoming provider appointments including: PCP, Dr Saint Cranker - 03/08/24 - patient to call and make them aware this needs to be a hospital follow up, Patient also with cardiology appointment 03/07/24 Ervin Heath P.A. and Neurology [facility] Lab Results  Component Value Date   HGBA1C 7.4 (H) 02/29/2024    Patient Self Care Activities:  Attend all scheduled provider appointments Call pharmacy for medication refills 3-7 days in advance of running out of medications Call provider office for new concerns or questions  Notify RN Care Manager of [facility] call rescheduling needs Participate in Transition of Care Program/Attend TOC scheduled calls Take medications as prescribed   take the blood sugar log to all doctor visits Seek medical attention immediately with any stroke symptoms including worsening blurred vision, weakness, facial droop, difficulty speaking  Plan:  Telephone follow up appointment with care management team member scheduled for:  03/13/24 11am with primary TOC RN, Orpha Blade  The patient has been provided with contact information for the care management team and has been advised to call with any health related questions or concerns.          Tonia Frankel RN, CCM [place]  VBCI-Population Health RN Care Manager [phone]

## 2024-03-07 ENCOUNTER — Other Ambulatory Visit (HOSPITAL_COMMUNITY): Payer: Self-pay | Admitting: Internal Medicine

## 2024-03-07 ENCOUNTER — Ambulatory Visit: Attending: Physician Assistant | Admitting: Physician Assistant

## 2024-03-07 ENCOUNTER — Encounter: Payer: Self-pay | Admitting: *Deleted

## 2024-03-07 ENCOUNTER — Encounter: Payer: Self-pay | Admitting: Physician Assistant

## 2024-03-07 ENCOUNTER — Telehealth: Payer: Self-pay | Admitting: *Deleted

## 2024-03-07 VITALS — BP 112/58 | HR 56 | Ht 66.0 in | Wt 168.6 lb

## 2024-03-07 DIAGNOSIS — G459 Transient cerebral ischemic attack, unspecified: Secondary | ICD-10-CM

## 2024-03-07 DIAGNOSIS — R928 Other abnormal and inconclusive findings on diagnostic imaging of breast: Secondary | ICD-10-CM

## 2024-03-07 DIAGNOSIS — I749 Embolism and thrombosis of unspecified artery: Secondary | ICD-10-CM

## 2024-03-07 DIAGNOSIS — E785 Hyperlipidemia, unspecified: Secondary | ICD-10-CM | POA: Diagnosis not present

## 2024-03-07 DIAGNOSIS — I10 Essential (primary) hypertension: Secondary | ICD-10-CM

## 2024-03-07 DIAGNOSIS — I25810 Atherosclerosis of coronary artery bypass graft(s) without angina pectoris: Secondary | ICD-10-CM | POA: Diagnosis not present

## 2024-03-07 DIAGNOSIS — Z006 Encounter for examination for normal comparison and control in clinical research program: Secondary | ICD-10-CM

## 2024-03-07 NOTE — Telephone Encounter (Signed)
 Open in error

## 2024-03-07 NOTE — Progress Notes (Unsigned)
 Patient enrolled for [facility] [employer]/ [employer] to ship a 30 day cardiac event monitor to her address on file.  Dr. Berry Bristol to read.

## 2024-03-07 NOTE — Patient Instructions (Signed)
 Medication Instructions:  NO CHANGES *If you need a refill on your cardiac medications before your next appointment, please call your pharmacy*  Lab Work: NO LABS If you have labs (blood work) drawn today and your tests are completely normal, you will receive your results only by: MyChart Message (if you have MyChart) OR A paper copy in the mail If you have any lab test that is abnormal or we need to change your treatment, we will call you to review the results.  Testing/Procedures: [employer] Cardiac Event Monitor Instructions  Your physician has requested you wear your cardiac event monitor for 30 days, (1-30). [employer] may call or text to confirm a shipping address. The monitor will be sent to a land address via UPS. [employer] will not ship a monitor to a PO BOX. It typically takes 3-5 days to receive your monitor after it has been enrolled. [employer] will assist with USPS tracking if your package is delayed. The telephone number for [employer] is [phone]. Once you have received your monitor, please review the enclosed instructions. Instruction tutorials can also be viewed under help and settings on the enclosed cell phone. Your monitor has already been registered assigning a specific monitor serial # to you.  Billing and Self Pay Discount Information  [employer] has been provided the insurance information we had on file for you.  If your insurance has been updated, please call [employer] at [phone] to provide them with your updated insurance information.   [employer] offers a discounted Self Pay option for patients who have insurance that does not cover their cardiac event monitor or patients without insurance.  The discounted cost of a Self Pay Cardiac Event Monitor would be $225.00 , if the patient contacts [employer] at [phone] within 7 days of applying the monitor to make payment arrangements.  If the patient does not contact [employer] within 7 days of applying  the monitor, the cost of the cardiac event monitor will be $350.00.  Applying the monitor  Remove cell phone from case and turn it on. The cell phone works as IT consultant and needs to be within [employer] of you at all times. The cell phone will need to be charged on a daily basis. We recommend you plug the cell phone into the enclosed charger at your bedside table every night.  Monitor batteries: You will receive two monitor batteries labelled #1 and #2. These are your recorders. Plug battery #2 onto the second connection on the enclosed charger. Keep one battery on the charger at all times. This will keep the monitor battery deactivated. It will also keep it fully charged for when you need to switch your monitor batteries. A small light will be blinking on the battery emblem when it is charging. The light on the battery emblem will remain on when the battery is fully charged.  Open package of a Monitor strip. Insert battery #1 into black hood on strip and gently squeeze monitor battery onto connection as indicated in instruction booklet. Set aside while preparing skin.  Choose location for your strip, vertical or horizontal, as indicated in the instruction booklet. Shave to remove all hair from location. There cannot be any lotions, oils, powders, or colognes on skin where monitor is to be applied. Wipe skin clean with enclosed Saline wipe. Dry skin completely.  Peel paper labeled #1 off the back of the Monitor strip exposing the adhesive. Place the monitor on the chest in the vertical or horizontal position shown in the instruction booklet.  One arrow on the monitor strip must be pointing upward. Carefully remove paper labeled #2, attaching remainder of strip to your skin. Try not to create any folds or wrinkles in the strip as you apply it.  Firmly press and release the circle in the center of the monitor battery. You will hear a small beep. This is turning the monitor battery on. The  heart emblem on the monitor battery will light up every 5 seconds if the monitor battery in turned on and connected to the patient securely. Do not push and hold the circle down as this turns the monitor battery off. The cell phone will locate the monitor battery. A screen will appear on the cell phone checking the connection of your monitor strip. This may read poor connection initially but change to good connection within the next minute. Once your monitor accepts the connection you will hear a series of 3 beeps followed by a climbing crescendo of beeps. A screen will appear on the cell phone showing the two monitor strip placement options. Touch the picture that demonstrates where you applied the monitor strip.  Your monitor strip and battery are waterproof. You are able to shower, bathe, or swim with the monitor on. They just ask you do not submerge deeper than 3 feet underwater. We recommend removing the monitor if you are swimming in a lake, river, or ocean.  Your monitor battery will need to be switched to a fully charged monitor battery approximately once a week. The cell phone will alert you of an action which needs to be made.  On the cell phone, tap for details to reveal connection status, monitor battery status, and cell phone battery status. The green dots indicates your monitor is in good status. A red dot indicates there is something that needs your attention.  To record a symptom, click the circle on the monitor battery. In 30-60 seconds a list of symptoms will appear on the cell phone. Select your symptom and tap save. Your monitor will record a sustained or significant arrhythmia regardless of you clicking the button. Some patients do not feel the heart rhythm irregularities. [employer] will notify us  of any serious or critical events.  Refer to instruction booklet for instructions on switching batteries, changing strips, the Do not disturb or Pause features, or any  additional questions.  Call [employer] at [phone], to confirm your monitor is transmitting and record your baseline. They will answer any questions you may have regarding the monitor instructions at that time.  Returning the monitor to [employer]  Place all equipment back into blue box. Peel off strip of paper to expose adhesive and close box securely. There is a prepaid UPS shipping label on this box. Drop in a UPS drop box, or at a UPS facility like [employer]. You may also contact [employer] to arrange UPS to pick up monitor package at your home.   Follow-Up: At [facility], you and your health needs are our priority.  As part of our continuing mission to provide you with exceptional heart care, our providers are all part of one team.  This team includes your primary Cardiologist (physician) and Advanced Practice Providers or APPs (Physician Assistants and Nurse Practitioners) who all work together to provide you with the care you need, when you need it.  Your next appointment:   4 month(s)  Provider:   Knox Perl, MD

## 2024-03-07 NOTE — Research (Signed)
 Called Dr Roslynn Coombes office to request records for patient.  Patient gave ok to request yesterday verbally.  Left message on VM   Bernett Brill :) RN BSN  Clinical Research Nurse  Be strong and take heart, all you who hope in the [place]. ~ Psalm 31:24

## 2024-03-07 NOTE — Progress Notes (Unsigned)
 Cardiology Office Note:  .   Date:  03/09/2024  ID:  Angela Rose, DOB [birth year], MRN [id number] PCP: Tobi Fortes, MD  [facility] Providers Cardiologist:  Knox Perl, MD     History of Present Illness: .   Angela Rose is a 78 y.o. female with past medical history of CAD s/p 3v CABG 1996, hypertension, hyperlipidemia, PAD, former smoker and carotid atherosclerosis.  Patient had a MI in 1993 and underwent coronary angioplasty and stenting.  Heart monitor at [facility] in April 2020 showed the [facility], PVCs, asymptomatic bradycardia, sinus tachycardia but no A-fib.  Most recent cardiac catheterization performed on 01/13/2023 showed 99% proximal sequential SVG-OM 2-distal RCA, culprit lesion appears to be downstream segment of sequential vein graft however was unable to be crossed, likely no percutaneous option.  Patient was last seen by Dr. Berry Bristol on 02/13/2024 due to intermittent chest discomfort which she described as a burning sensation.  She was started on Imdur  30 mg daily.  She was also enrolled in Librax trial for cardiovascular health and was prescribed daily oral nitroglycerin  to be taken every morning to prevent chest pain during active work.  She was continued on Plavix  monotherapy and the aspirin  was discontinued.  She has a history of intolerance of statins and was prescribed Repatha  and Zetia .  More recently, patient was admitted to the hospital on 03/02/2024 secondary to complaint of painless loss of vision in the upper field of the right eye.  CTA of the head and neck shows significant small vessel disease but no acute finding.  Echo cardiogram showed EF 50 to 55%, there was regional wall motion abnormality.  MRI of the brain showed no acute intracranial finding.  Neurology was consulted recommended aspirin , Plavix  and a 30-day heart monitor.  The case was discussed with Dr. Judy Null who recommended to hold mL vaccine which is part of the Librax to trial.   Patient presents  today accompanied by husband.  She says she never started on the Imdur  that was prescribed during the last office visit since she never received it at her pharmacy.  Her blood pressure is borderline today, therefore I do not feel strongly about restarting on the Imdur .  She has not had any chest discomfort in the past 3 weeks.  Since discharge, she continued to have a right visual cut.  She was seen by [facility] ophthalmology service Dr. Dema Filler who suggested she may had a embolic plaque that traveled up.  While waiting on the record from [facility] ophthalmology.  We will set up a 30-day heart monitor for this patient.  She will follow-up in September with Dr. Berry Bristol to review the results.    ROS:   She denies chest pain, palpitations, dyspnea, pnd, orthopnea, n, v, dizziness, syncope, edema, weight gain, or early satiety. All other systems reviewed and are otherwise negative except as noted above.    Studies Reviewed: .        Cardiac Studies & Procedures   ______________________________________________________________________________________________ CARDIAC CATHETERIZATION  CARDIAC CATHETERIZATION 01/13/2023  Conclusion   Dist LM to Ost LAD lesion is 90% stenosed.  Prox LAD to Mid LAD lesion is 100% stenosed (after SP1).   LIMA-(D2)-LAD graft was visualized by angiography and is moderate in size.  The graft exhibits no disease.   Mid Cx lesion is 100% stenosed.   Prox RCA to Dist RCA lesion is 100% stenosed.   Seq SVG- Sequential SVG-OM2-distal\ RCA graft was visualized by angiography and is large.  Mid Graft lesion before 2nd Mrg  is 99% stenosed. -> Balloon angioplasty was performed using a BALLN SAPPHIRE 1.5X20. & Balloon angioplasty was performed using a BALLN SAPPHIRE 1.0X10. = Neither balloon was able to cross the lesion.  Post intervention, there is a 95% residual stenosis.   Dist Graft lesion before 2nd Mrg  is 80% stenosed.  Remainder of the proximal limb is free of disease  until the first anastomosis   First anastomosis is to the 2nd Mrg lesion-> retrograde flow reveals 65% stenosed proximal to SVG insertion site.   Prox Graft lesion between 2nd Mrg and Dist RCA  is 90% stenosed.  Prox Graft to Insertion lesion between 2nd Mrg and Dist RCA is 80% stenosed-diffuse thrombus beyond the 90% stenosis.   There is moderate left ventricular systolic dysfunction.   LV end diastolic pressure is normal.   The left ventricular ejection fraction is 35-45% by visual estimate.   Post-Cath Diagnoses: 99% proximal sequential SVG-OM2-d RCA: The culprit lesion appears to be the downstream segment of the sequential vein graft, however unable to cross the upstream 99% in the first leg even with his 1.0 mm balloon.  Likely no percutaneous options. Severe native disease with near ostial LAD occlusion after SP1, as well as proximal RCA, mid LCx occlusion Moderate to mildly reduced EF with regional wall motion abnormalities suggesting basal to mid inferior hypo to akinesis and then inferoapical hypokinesis. Normal LVEDP   PLAN OF CARE: Admit to inpatient  -> will start heparin  to run 48 hours beginning 8 hours after sheath removal.  Will need to plan medical management of basically occluded distal leg of the vein graft and unable to cross the more proximal limb. Will treat with Brilinta  today and then convert on Friday, 01/14/2023 to Plavix  treating with 300 mg x 1 and then on 01/15/2023 75 mg daily. Will check lipid panel, A1c and LP(a)-has statin intolerance, is on PCSK9 inhibitor at baseline, this will be continued at discharge. Will hold ACE inhibitor today and restart on 01/14/2023.  Hold HCTZ during hospitalization and continue amlodipine . Monitor heart rate to see if she can tolerate beta-blocker.  Findings Coronary Findings Diagnostic  Dominance: Right  Left Main Vessel was injected. Vessel is normal in caliber. There is moderate diffuse disease throughout the vessel. There is  moderate focal disease in the vessel. Dist LM to Ost LAD lesion is 90% stenosed.  Left Anterior Descending There is moderate diffuse disease throughout the vessel. Prox LAD to Mid LAD lesion is 100% stenosed.  First Diagonal Branch Vessel is small in size.  Second Diagonal Branch There is moderate disease in the vessel.  Left Circumflex Mid Cx lesion is 100% stenosed.  First Obtuse Marginal Branch Vessel is small in size.  Second Obtuse Marginal Branch 2nd Mrg lesion is 65% stenosed.  Right Coronary Artery Vessel was injected. Vessel is normal in caliber. There is severe diffuse disease throughout the vessel. There is severe focal disease in the vessel. Prox RCA to Dist RCA lesion is 100% stenosed.  Right Ventricular Branch Vessel is small in size.  Right Posterior Descending Artery Vessel is small in size. There is severe disease in the vessel.  Inferior Septal Collaterals Inf Sept filled by collaterals from 1st Sept.  Right Posterior Atrioventricular Artery There is severe disease in the vessel.  First Right Posterolateral Branch There is severe disease in the vessel.  Second Right Posterolateral Branch There is severe disease in the vessel.  Sequential LIMA LIMA Graft To  2nd Diag, Mid LAD LIMA graft was visualized by angiography and is moderate in size.  The graft exhibits no disease. It appears to have an anastomosis with a diagonal branch before the LAD.  Sequential Jump Graft Graft To 2nd Mrg, Dist RCA Seq SVG- Sequential SVG-OM2-distalRCA graft was visualized by angiography and is large. Mid Graft lesion before 2nd Mrg  is 99% stenosed. Vessel is not the culprit lesion. The lesion is focal. The lesion is severely calcified. Dist Graft lesion before 2nd Mrg  is 80% stenosed. The lesion is focal and eccentric. The lesion is calcified. Prox Graft lesion between 2nd Mrg and Dist RCA  is 90% stenosed. Vessel is the culprit lesion. The lesion is concentric,  irregular and ulcerative. Unable to reach this lesion as a I was unable to cross the initial lesion in leg 1. Prox Graft to Insertion lesion between 2nd Mrg and Dist RCA  is 85% stenosed. The lesion is heavily thrombotic.  Intervention  Mid Graft lesion before 2nd Mrg (Sequential Jump Graft Graft To 2nd Mrg, Dist RCA) Angioplasty Lesion length:  8 mm. CATHETER LAUNCHER 6FR AL1 guide catheter was inserted. WIRE ASAHI PROWATER 180CM guidewire used to cross lesion. Balloon angioplasty was performed using a BALLN SAPPHIRE 1.0X10. 1) BALLN EMERGE MR 2.5X12-would not cross, attempted angioplasty at the beginning of the lesion, but still could not cross; 2) BALLN SAPPHIRE 1.5X20 -&gt; also, would not cross Maximum pressure: 8 atm. Inflation time: 10 sec. Post-Intervention Lesion Assessment The intervention was unsuccessful due to inability to cross the lesion with balloon. Pre-interventional TIMI flow is 3. Post-intervention TIMI flow is 3. Treated lesion length:  8 mm. No complications occurred at this lesion. Not able to cross lesion, therefore not able to address the other lesions downstream. There is a 95% residual stenosis post intervention.     ECHOCARDIOGRAM  ECHOCARDIOGRAM COMPLETE 03/01/2024  Narrative ECHOCARDIOGRAM REPORT    Patient Name:   AURIELLE SLINGERLAND Date of Exam: 03/01/2024 Medical Rec #:  [id number]            Height:       66.0 in Accession #:    [id number]           Weight:       166.2 lb Date of Birth:  [birth year]            BSA:          1.849 m Patient Age:    78 years             BP:           134/52 mmHg Patient Gender: F                    HR:           58 bpm. Exam Location:  [facility]  Procedure: 2D Echo, Color Doppler and Cardiac Doppler (Both Spectral and Color Flow Doppler were utilized during procedure).  Indications:    Stroke  History:        Patient has prior history of Echocardiogram examinations, most recent 04/20/2023.  Sonographer:    Andrena Bang Referring Phys: [address]  IMPRESSIONS   1. Inferior basal akinesis . Left ventricular ejection fraction, by estimation, is 50 to 55%. The left ventricle has low normal function. The left ventricle demonstrates regional wall motion abnormalities (see scoring diagram/findings for description). The left ventricular internal cavity size was mildly dilated. Left ventricular diastolic parameters were  normal. 2. Right ventricular systolic function is normal. The right ventricular size is normal. 3. Cannot r/o small PFO consider bubble study if clinically indicated. 4. The mitral valve is abnormal. Mild mitral valve regurgitation. No evidence of mitral stenosis. Moderate mitral annular calcification. 5. The aortic valve is tricuspid. There is moderate calcification of the aortic valve. There is moderate thickening of the aortic valve. Aortic valve regurgitation is not visualized. Mild aortic valve stenosis. 6. The inferior vena cava is normal in size with greater than 50% respiratory variability, suggesting right atrial pressure of 3 mmHg.  FINDINGS Left Ventricle: Inferior basal akinesis. Left ventricular ejection fraction, by estimation, is 50 to 55%. The left ventricle has low normal function. The left ventricle demonstrates regional wall motion abnormalities. Strain was performed and the global longitudinal strain is indeterminate. The left ventricular internal cavity size was mildly dilated. There is no left ventricular hypertrophy. Left ventricular diastolic parameters were normal.  Right Ventricle: The right ventricular size is normal. No increase in right ventricular wall thickness. Right ventricular systolic function is normal.  Left Atrium: Left atrial size was normal in size.  Right Atrium: Right atrial size was normal in size.  Pericardium: There is no evidence of pericardial effusion.  Mitral Valve: The mitral valve is abnormal. There is mild thickening of the mitral  valve leaflet(s). There is mild calcification of the mitral valve leaflet(s). Moderate mitral annular calcification. Mild mitral valve regurgitation. No evidence of mitral valve stenosis.  Tricuspid Valve: The tricuspid valve is normal in structure. Tricuspid valve regurgitation is not demonstrated. No evidence of tricuspid stenosis.  Aortic Valve: The aortic valve is tricuspid. There is moderate calcification of the aortic valve. There is moderate thickening of the aortic valve. Aortic valve regurgitation is not visualized. Mild aortic stenosis is present. Aortic valve mean gradient measures 4.0 mmHg. Aortic valve peak gradient measures 7.2 mmHg. Aortic valve area, by VTI measures 1.57 cm.  Pulmonic Valve: The pulmonic valve was normal in structure. Pulmonic valve regurgitation is not visualized. No evidence of pulmonic stenosis.  Aorta: The aortic root is normal in size and structure.  Venous: The inferior vena cava is normal in size with greater than 50% respiratory variability, suggesting right atrial pressure of 3 mmHg.  IAS/Shunts: The interatrial septum was not well visualized.  Additional Comments: 3D was performed not requiring image post processing on an independent workstation and was indeterminate.   LEFT VENTRICLE PLAX 2D LVIDd:         5.00 cm      Diastology LVIDs:         3.80 cm      LV e' medial:  7.07 cm/s LV PW:         0.90 cm      LV e' lateral: 6.74 cm/s LV IVS:        1.10 cm LVOT diam:     1.90 cm LV SV:         52 LV SV Index:   28 LVOT Area:     2.84 cm  LV Volumes (MOD) LV vol d, MOD A2C: 126.0 ml LV vol d, MOD A4C: 111.0 ml LV vol s, MOD A2C: 49.9 ml LV vol s, MOD A4C: 49.7 ml LV SV MOD A2C:     76.1 ml LV SV MOD A4C:     111.0 ml LV SV MOD BP:      68.5 ml  RIGHT VENTRICLE RV S prime:     7.07 cm/s TAPSE (  M-mode): 1.2 cm  LEFT ATRIUM             Index LA diam:        3.50 cm 1.89 cm/m LA Vol (A2C):   55.1 ml 29.81 ml/m LA Vol (A4C):    73.6 ml 39.81 ml/m LA Biplane Vol: 64.0 ml 34.62 ml/m AORTIC VALVE AV Area (Vmax):    1.67 cm AV Area (Vmean):   1.47 cm AV Area (VTI):     1.57 cm AV Vmax:           134.00 cm/s AV Vmean:          91.500 cm/s AV VTI:            0.333 m AV Peak Grad:      7.2 mmHg AV Mean Grad:      4.0 mmHg LVOT Vmax:         79.10 cm/s LVOT Vmean:        47.300 cm/s LVOT VTI:          0.184 m LVOT/AV VTI ratio: 0.55  AORTA Ao Root diam: 3.30 cm   SHUNTS Systemic VTI:  0.18 m Systemic Diam: 1.90 cm  Janelle Mediate MD Electronically signed by Janelle Mediate MD Signature Date/Time: 03/01/2024/12:34:03 PM    Final    MONITORS  LONG TERM MONITOR (3-14 DAYS) 10/23/2021  Narrative Agree with findings below. Sinus rhythm with PVCs. No significant tachyarrhythmia or bradyarrhythmia. No atrial fibrillation or flutter.    Patch Wear Time:  13 days and 3 hours (2022-12-13T21:54:18-0500 to 2022-12-27T01:14:53-0500)  Patient had a min HR of 43 bpm, max HR of 156 bpm, and avg HR of 73 bpm. Predominant underlying rhythm was Sinus Rhythm. First Degree AV Block was present. 5 Ventricular Tachycardia runs occurred, the run with the fastest interval lasting 4 beats with a max rate of 156 bpm, the longest lasting 6 beats with an avg rate of 120 bpm. Isolated SVEs were frequent (23.8%, [id number]), SVE Couplets were rare (<1.0%, 1319), and SVE Triplets were rare (<1.0%, 270). Isolated VEs were occasional (4.7%, [id number]), VE Couplets were rare (<1.0%, 1880), and VE Triplets were rare (<1.0%, 143). Ventricular Bigeminy and Trigeminy were present.       ______________________________________________________________________________________________      Risk Assessment/Calculations:             Physical Exam:   VS:  BP (!) 112/58   Pulse (!) 56   Ht 5\' 6"  (1.676 m)   Wt 168 lb 9.6 oz (76.5 kg)   SpO2 95%   BMI 27.21 kg/m    Wt Readings from Last 3 Encounters:  03/08/24 170 lb 3.2 oz (77.2 kg)   03/07/24 168 lb 9.6 oz (76.5 kg)  03/06/24 163 lb (73.9 kg)    GEN: Well nourished, well developed in no acute distress NECK: No JVD; No carotid bruits CARDIAC: RRR, no murmurs, rubs, gallops RESPIRATORY:  Clear to auscultation without rales, wheezing or rhonchi  ABDOMEN: Soft, non-tender, non-distended EXTREMITIES:  No edema; No deformity   ASSESSMENT AND PLAN: .    Recent TIA: Per ophthalmology service, there is some evidence of embolic plaque.  She is currently on aspirin  and Plavix .  At the recommendation of neurology service, we will proceed with a 30-day heart monitor to rule out A-fib  CAD: Denies any chest pain.  Hypertension: Blood pressure stable  Hyperlipidemia: On Repatha        Dispo: Follow-up with Dr. Berry Bristol in September  Signed, Zilphia Kozinski,  PA

## 2024-03-08 ENCOUNTER — Encounter: Payer: Self-pay | Admitting: Internal Medicine

## 2024-03-08 ENCOUNTER — Ambulatory Visit (INDEPENDENT_AMBULATORY_CARE_PROVIDER_SITE_OTHER): Admitting: Internal Medicine

## 2024-03-08 VITALS — BP 114/68 | HR 65 | Ht 66.0 in | Wt 170.2 lb

## 2024-03-08 DIAGNOSIS — I10 Essential (primary) hypertension: Secondary | ICD-10-CM

## 2024-03-08 DIAGNOSIS — E118 Type 2 diabetes mellitus with unspecified complications: Secondary | ICD-10-CM | POA: Diagnosis not present

## 2024-03-08 DIAGNOSIS — H34231 Retinal artery branch occlusion, right eye: Secondary | ICD-10-CM

## 2024-03-08 DIAGNOSIS — E782 Mixed hyperlipidemia: Secondary | ICD-10-CM | POA: Diagnosis not present

## 2024-03-08 DIAGNOSIS — N1832 Chronic kidney disease, stage 3b: Secondary | ICD-10-CM | POA: Diagnosis not present

## 2024-03-08 NOTE — Progress Notes (Unsigned)
 Established Patient Office Visit  Subjective   Patient ID: Angela Rose, female    DOB: [birth year]  Age: 78 y.o. MRN: [id number]  Chief Complaint  Patient presents with   Care Management    Three month follow up    Angela Rose returns to care today for routine follow-up.  She was last evaluated by me on 1/16 at which time she was referred to nephrology in the setting of chronic kidney disease.  Repeat labs were ordered and 45-month follow-up was arranged.  In the interim, she presented to the emergency department 5/21 endorsing sudden onset, painless loss of vision in the superior visual field of the right eye.  Admitted 5/21 - 5/23 for suspected branch retinal artery occlusion.  Seen by ophthalmology for follow-up on the day of hospital discharge (5/23).  She was continued on Plavix , ASA 81 mg daily added.  Seen by cardiology for follow-up yesterday (5/28).  Today she reports feeling well and have no acute concerns to discuss.  Past Medical History:  Diagnosis Date   Anxiety 07/10/2014   Basal cell carcinoma    Cancer (HCC) 12/13/2011   melanoma face   CHF (congestive heart failure), NYHA class II, acute on chronic, combined (HCC) 01/14/2023   Chronic kidney disease (CKD), stage III (moderate) (HCC) 02/17/2022   Chronic sinusitis 07/02/2022   Depression 07/10/2014   Diabetes mellitus without complication (HCC) 08/21/2014   Fracture of tibial plateau 12/31/2015   Hyperlipidemia 08/14/2015   Hypertension 09/03/2014   Hypothyroidism, postablative 02/08/2014   Melanoma (HCC)    Myocardial infarction (HCC) 1993   Tick bites 10/29/2014   Vitamin D  deficiency 12/08/2022   Past Surgical History:  Procedure Laterality Date   ABDOMINAL HYSTERECTOMY     APPENDECTOMY     BASAL CELL CARCINOMA EXCISION     BREAST BIOPSY Right    needle bx-neg   CATARACT EXTRACTION, BILATERAL Bilateral 11/23/2018   CORONARY ARTERY BYPASS GRAFT  1996   x 3   CORONARY/GRAFT ACUTE MI  REVASCULARIZATION N/A 01/13/2023   Procedure: Coronary/Graft Acute MI Revascularization;  Surgeon: Arleen Lacer, MD;  Location: [facility] INVASIVE CV LAB;  Service: Cardiovascular;  Laterality: N/A;   LEFT HEART CATH AND CORONARY ANGIOGRAPHY N/A 01/13/2023   Procedure: LEFT HEART CATH AND CORONARY ANGIOGRAPHY;  Surgeon: Arleen Lacer, MD;  Location: [facility] INVASIVE CV LAB;  Service: Cardiovascular;  Laterality: N/A;   MELANOMA EXCISION     PARTIAL HYSTERECTOMY  1976   ovaries remain   Social History   Tobacco Use   Smoking status: Former    Current packs/day: 0.00    Types: Cigarettes    Quit date: 06/11/1982    Years since quitting: 41.7   Smokeless tobacco: Never   Tobacco comments:    smoking cessation materials not required  Vaping Use   Vaping status: Never Used  Substance Use Topics   Alcohol use: Yes    Alcohol/week: 0.0 standard drinks of alcohol    Comment: rare 1 wine glass of wine   Drug use: No   Family History  Problem Relation Age of Onset   Skin cancer Mother    Squamous cell carcinoma Mother    Basal cell carcinoma Mother    Heart disease Father    CAD Father    Breast cancer Neg Hx    Allergies  Allergen Reactions   Propoxyphene Nausea And Vomiting   Lidocaine -Epinephrine  (Pf) Palpitations and Other (See Comments)    Chest pain  Other Other (See Comments)    Sutures - in her hand - very irritated   Statins Other (See Comments)    Muscle pain and weakness in legs   Tape Rash    Bandaids   Xylocaine   [Lidocaine  Hcl] Palpitations and Other (See Comments)    Chest pain   Jardiance  [Empagliflozin ] Other (See Comments)    Recurrent vaginal infections   Review of Systems  Constitutional:  Negative for chills and fever.  HENT:  Negative for sore throat.   Eyes:        Loss of vision in superior visual field of right eye.  Respiratory:  Negative for cough and shortness of breath.   Cardiovascular:  Negative for chest pain, palpitations and leg swelling.   Gastrointestinal:  Negative for abdominal pain, blood in stool, constipation, diarrhea, nausea and vomiting.  Genitourinary:  Negative for dysuria and hematuria.  Musculoskeletal:  Negative for myalgias.  Skin:  Negative for itching and rash.  Neurological:  Negative for dizziness and headaches.  Psychiatric/Behavioral:  Negative for depression and suicidal ideas.      Objective:     BP 114/68   Pulse 65   Ht 5\' 6"  (1.676 m)   Wt 170 lb 3.2 oz (77.2 kg)   SpO2 93%   BMI 27.47 kg/m  BP Readings from Last 3 Encounters:  03/08/24 114/68  03/07/24 (!) 112/58  03/02/24 (!) 114/43   Physical Exam Vitals reviewed.  Constitutional:      General: She is not in acute distress.    Appearance: Normal appearance. She is not toxic-appearing.  HENT:     Head: Normocephalic and atraumatic.     Right Ear: External ear normal.     Left Ear: External ear normal.     Nose: Nose normal. No congestion or rhinorrhea.     Mouth/Throat:     Mouth: Mucous membranes are moist.     Pharynx: Oropharynx is clear. No oropharyngeal exudate or posterior oropharyngeal erythema.  Eyes:     General: No scleral icterus.    Extraocular Movements: Extraocular movements intact.     Conjunctiva/sclera: Conjunctivae normal.     Pupils: Pupils are equal, round, and reactive to light.     Comments: Reduced superior visual field in right eye  Cardiovascular:     Rate and Rhythm: Normal rate and regular rhythm.     Pulses: Normal pulses.     Heart sounds: Normal heart sounds. No murmur heard.    No friction rub. No gallop.  Pulmonary:     Effort: Pulmonary effort is normal.     Breath sounds: Normal breath sounds. No wheezing, rhonchi or rales.  Abdominal:     General: Abdomen is flat. Bowel sounds are normal. There is no distension.     Palpations: Abdomen is soft.     Tenderness: There is no abdominal tenderness.  Musculoskeletal:        General: No swelling. Normal range of motion.     Cervical back:  Normal range of motion.     Right lower leg: No edema.     Left lower leg: No edema.  Lymphadenopathy:     Cervical: No cervical adenopathy.  Skin:    General: Skin is warm and dry.     Capillary Refill: Capillary refill takes less than 2 seconds.     Coloration: Skin is not jaundiced.  Neurological:     General: No focal deficit present.     Mental Status: She is  alert and oriented to person, place, and time.  Psychiatric:        Mood and Affect: Mood normal.        Behavior: Behavior normal.   Last CBC Lab Results  Component Value Date   WBC 6.4 03/02/2024   HGB 12.2 03/02/2024   HCT 37.0 03/02/2024   MCV 93.0 03/02/2024   MCH 30.7 03/02/2024   RDW 13.9 03/02/2024   PLT 253 03/02/2024   Last metabolic panel Lab Results  Component Value Date   GLUCOSE 124 (H) 03/02/2024   NA 136 03/02/2024   K 4.2 03/02/2024   CL 106 03/02/2024   CO2 25 03/02/2024   BUN 27 (H) 03/02/2024   CREATININE 1.19 (H) 03/02/2024   GFRNONAA 46 (L) 03/02/2024   CALCIUM  8.1 (L) 03/02/2024   PROT 7.3 09/02/2023   ALBUMIN 4.2 09/02/2023   LABGLOB 3.1 09/02/2023   AGRATIO 1.6 12/27/2022   BILITOT 0.4 09/02/2023   ALKPHOS 133 (H) 09/02/2023   AST 29 09/02/2023   ALT 19 09/02/2023   ANIONGAP 5 03/02/2024   Last lipids Lab Results  Component Value Date   CHOL 126 03/01/2024   HDL 41 03/01/2024   LDLCALC 40 03/01/2024   TRIG 224 (H) 03/01/2024   CHOLHDL 3.1 03/01/2024   Last hemoglobin A1c Lab Results  Component Value Date   HGBA1C 7.4 (H) 02/29/2024   Last thyroid  functions Lab Results  Component Value Date   TSH 2.660 09/02/2023   T4TOTAL 8.2 02/16/2016   Last vitamin D  Lab Results  Component Value Date   VD25OH 21.6 (L) 12/06/2022     Assessment & Plan:   Problem List Items Addressed This Visit       Essential (primary) hypertension (Chronic)   Adequately controlled on current antihypertensive regimen.  No medication changes are indicated today.      Branch retinal  artery occlusion of right eye - Primary   Recent hospital admission 5/21 - 5/23 after sudden onset, painless loss of the superior visual field in the right eye.  Ultimately found to have a branch retinal artery occlusion.  Seen by ophthalmology 5/23.  Currently on DAPT (ASA/Plavix ) and Repatha .  Neurology follow-up 7/14 and ophthalmology follow-up is scheduled for September.      Type II diabetes mellitus with complication (HCC) (Chronic)   Followed by endocrinology.  A1c 7.4 on labs from last week.  She is currently prescribed metformin  and Tresiba .  Endocrinology follow-up is scheduled for late July.      Chronic kidney disease, stage 3b (HCC)   Renal function stable on latest labs.  She was previously referred to nephrology and has established care with Dr. Carrolyn Clan.  She remains on ARNi.       Hyperlipidemia   Lipid panel updated 5/22.  Total cholesterol 126 and LDL 40.  Curiously, her previous lipid panel from 5/5 showed LDL 131.  She is currently prescribed Repatha .      Return in about 3 months (around 06/08/2024).   Tobi Fortes, MD

## 2024-03-08 NOTE — Patient Instructions (Signed)
 It was a pleasure to see you today.  Thank you for giving [place] the opportunity to be involved in your care.  Below is a brief recap of your visit and next steps.  We will plan to see you again in 3 months.  Summary No medication changes today Follow up in 3 months

## 2024-03-09 ENCOUNTER — Ambulatory Visit: Attending: Cardiology

## 2024-03-09 DIAGNOSIS — G459 Transient cerebral ischemic attack, unspecified: Secondary | ICD-10-CM

## 2024-03-09 DIAGNOSIS — H34231 Retinal artery branch occlusion, right eye: Secondary | ICD-10-CM | POA: Insufficient documentation

## 2024-03-09 NOTE — Assessment & Plan Note (Signed)
 Lipid panel updated 5/22.  Total cholesterol 126 and LDL 40.  Curiously, her previous lipid panel from 5/5 showed LDL 131.  She is currently prescribed Repatha .

## 2024-03-09 NOTE — Assessment & Plan Note (Signed)
 Recent hospital admission 5/21 - 5/23 after sudden onset, painless loss of the superior visual field in the right eye.  Ultimately found to have a branch retinal artery occlusion.  Seen by ophthalmology 5/23.  Currently on DAPT (ASA/Plavix ) and Repatha .  Neurology follow-up 7/14 and ophthalmology follow-up is scheduled for September.

## 2024-03-09 NOTE — Assessment & Plan Note (Signed)
 Adequately controlled on current antihypertensive regimen.  No medication changes are indicated today.

## 2024-03-09 NOTE — Assessment & Plan Note (Addendum)
 Renal function stable on latest labs.  She was previously referred to nephrology and has established care with Dr. Carrolyn Clan.  She remains on ARNi.

## 2024-03-09 NOTE — Assessment & Plan Note (Signed)
 Followed by endocrinology.  A1c 7.4 on labs from last week.  She is currently prescribed metformin  and Tresiba .  Endocrinology follow-up is scheduled for late July.

## 2024-03-12 ENCOUNTER — Telehealth: Payer: Self-pay | Admitting: Nurse Practitioner

## 2024-03-12 DIAGNOSIS — E1122 Type 2 diabetes mellitus with diabetic chronic kidney disease: Secondary | ICD-10-CM

## 2024-03-12 DIAGNOSIS — E890 Postprocedural hypothyroidism: Secondary | ICD-10-CM

## 2024-03-12 NOTE — Telephone Encounter (Signed)
 Pt needs labs updated

## 2024-03-12 NOTE — Telephone Encounter (Signed)
 Orders Placed This Encounter  Procedures   T4, free   TSH   Comprehensive metabolic panel with GFR   updated

## 2024-03-13 ENCOUNTER — Other Ambulatory Visit: Payer: Self-pay

## 2024-03-13 DIAGNOSIS — H5203 Hypermetropia, bilateral: Secondary | ICD-10-CM | POA: Diagnosis not present

## 2024-03-13 DIAGNOSIS — H40033 Anatomical narrow angle, bilateral: Secondary | ICD-10-CM | POA: Diagnosis not present

## 2024-03-13 NOTE — Transitions of Care (Post Inpatient/ED Visit) (Signed)
 Transition of Care week 2  Visit Note  03/13/2024  Name: Angela Rose MRN: [id number]          DOB: 05/04/1946  Situation: Patient enrolled in [facility] 30-day program. Visit completed with pt by telephone.   Background:   Initial Transition Care Management Follow-up Telephone Call    Past Medical History:  Diagnosis Date   Anxiety 07/10/2014   Basal cell carcinoma    Branch retinal artery occlusion 02/29/2024   Cancer (HCC) 12/13/2011   melanoma face   CHF (congestive heart failure), NYHA class II, acute on chronic, combined (HCC) 01/14/2023   Chronic kidney disease (CKD), stage III (moderate) (HCC) 02/17/2022   Chronic sinusitis 07/02/2022   Depression 07/10/2014   Diabetes mellitus without complication (HCC) 08/21/2014   Fracture of tibial plateau 12/31/2015   Hyperlipidemia 08/14/2015   Hypertension 09/03/2014   Hypothyroidism, postablative 02/08/2014   Melanoma (HCC)    Myocardial infarction (HCC) 1993   Tick bites 10/29/2014   Vitamin D  deficiency 12/08/2022    Assessment: Patient reports that she is doing well.  Reports continued vision problems but able to up and about.  Reports being overwhelmed with many MD appointments.  Saw Dr Kermit Ped for follow up.  Planning to get away for 1-2 months after July 14th.  Patient reports her main concern right now is bed bugs. Reports that they come out about 2am and she stays up with a UVB light and kills them. Reports that she has encased her mattress and has a bug killing strip in the bag that encases her mattress. Reports that she got bed bugs from a book at [place].   Offered to assist with a SW referral and patient declined.  Patient reports that she has it under good control.  Patient Reported Symptoms: Cognitive Cognitive Status: Able to follow simple commands, Alert and oriented to person, place, and time, Normal speech and language skills      Neurological Neurological Review of Symptoms: Vision changes Neurological  Conditions: Stroke, ischemic Neurological Management Strategies: Coping strategies Neurological Self-Management Outcome: 4 (good)  HEENT HEENT Symptoms Reported: No symptoms reported      Cardiovascular Cardiovascular Symptoms Reported: Swelling in legs or feet Does patient have uncontrolled Hypertension?: No Cardiovascular Conditions: Heart failure Cardiovascular Comment: Reviewed importance of daily weights and low salt diet.  Respiratory Respiratory Symptoms Reported: No symptoms reported    Endocrine Is patient diabetic?: Yes Is patient checking blood sugars at home?: Yes    Gastrointestinal Gastrointestinal Symptoms Reported: No symptoms reported      Genitourinary Genitourinary Symptoms Reported: No symptoms reported    Integumentary Integumentary Symptoms Reported: Other Other Integumentary Symptoms: reports bed bugs    Musculoskeletal Musculoskelatal Symptoms Reviewed: No symptoms reported        Psychosocial Psychosocial Symptoms Reported: No symptoms reported         There were no vitals filed for this visit.  Medications Reviewed Today     Reviewed by Vanetta Generous, RN (Registered Nurse) on 03/13/24 at 1119  Med List Status: <None>   Medication Order Taking? Sig Documenting Provider Last Dose Status Informant  albuterol  (VENTOLIN  HFA) 108 (90 Base) MCG/ACT inhaler [id number] No Inhale into the lungs.  Patient not taking: Reported on 03/08/2024   [provider] Not Taking Active   aspirin  EC 81 MG tablet [id number] No Take 1 tablet (81 mg total) by mouth daily. Swallow whole. Ezenduka, Nkeiruka J, MD Taking Active   Blood Glucose Monitoring Suppl (ACCU-CHEK GUIDE  ME) w/Device KIT [id number] No Use to check blood sugars twice a day , before breakfast and before bed. Wendel Hals, NP Taking Active Self, Spouse/Significant Other  Cholecalciferol (VITAMIN D3) 50 MCG (2000 UT) capsule [id number] No Take 2,000 Units by mouth daily. [provider]  Taking Active Self, Spouse/Significant Other  clopidogrel  (PLAVIX ) 75 MG tablet [id number] No Take 1 tablet (75 mg total) by mouth daily. Bridgette Campus, MD Taking Active Self, Spouse/Significant Other  ezetimibe  (ZETIA ) 10 MG tablet [id number] No Take 1 tablet (10 mg total) by mouth daily. Knox Perl, MD Taking Active Self, Spouse/Significant Other  FLUoxetine  (PROZAC ) 40 MG capsule [id number] No TAKE 1 CAPSULE(40 MG) BY MOUTH DAILY Dixon, Phillip E, MD Taking Active Self, Spouse/Significant Other  furosemide  (LASIX ) 40 MG tablet [id number] No TAKE 1 TABLET BY MOUTH AS NEEDED FOR EDEMA(3 LB WEIGHT GAIN OVERNIGHT)  Patient taking differently: Take 20 mg by mouth daily as needed for fluid or edema (weight gain over 3 lb overnight).   Bridgette Campus, MD Taking Active Self, Spouse/Significant Other  Glucose Blood (ACCU-CHEK GUIDE TEST VI) [id number] No by In Vitro route. [provider] Taking Active Self, Spouse/Significant Other  glucose blood (ACCU-CHEK GUIDE TEST) test strip [id number] No Use to check blood sugar before breakfast and before bed. Wendel Hals, NP Taking Active Self, Spouse/Significant Other  Glucose Blood (BLOOD GLUCOSE TEST STRIPS) STRP [id number] No Use to check glucose twice daily Wendel Hals, NP Taking Active Self, Spouse/Significant Other  insulin  degludec (TRESIBA  FLEXTOUCH) 100 UNIT/ML FlexTouch Pen [id number] No Inject 12 Units into the skin at bedtime. DX Code E11.8 , Z79.4 Reardon, Arminda Landmark, NP Taking Active Self, Spouse/Significant Other  Insulin  Pen Needle (BD PEN NEEDLE NANO U/F) 32G X 4 MM MISC [id number] No Use to check glucose once daily Wendel Hals, NP Taking Active Self, Spouse/Significant Other  levothyroxine  (SYNTHROID ) 50 MCG tablet [id number] No Take 1 tablet (50 mcg total) by mouth daily before breakfast.  Patient taking differently: Take 50 mcg by mouth at bedtime.   Wendel Hals, NP Taking Active Self, Spouse/Significant Other            Med Note (WARD, ANGELICA G   Wed Feb 29, 2024  8:07 PM)    metFORMIN  (GLUCOPHAGE -XR) 500 MG 24 hr tablet [id number] No Take 1 tablet (500 mg total) by mouth daily with breakfast. Resume on 01/18/23 Wendel Hals, NP Taking Active Self, Spouse/Significant Other  metoprolol  succinate (TOPROL -XL) 25 MG 24 hr tablet [id number] No Take 1 tablet (25 mg total) by mouth daily. Bridgette Campus, MD Taking Active Self, Spouse/Significant Other  nitroGLYCERIN  (NITROSTAT ) 0.4 MG SL tablet [id number] No Place 1 tablet (0.4 mg total) under the tongue every 5 (five) minutes as needed for chest pain. Knox Perl, MD Taking Active Self, Spouse/Significant Other  REPATHA  SURECLICK 140 MG/ML SOAJ [id number] No Inject 140 mg into the skin every 14 (fourteen) days. Tobi Fortes, MD Taking Active Self, Spouse/Significant Other  sacubitril -valsartan  (ENTRESTO ) 24-26 MG [id number] No TAKE 1 TABLET BY MOUTH TWICE DAILY Branch, Tomas Fountain, MD Taking Active Self, Spouse/Significant Other  spironolactone  (ALDACTONE ) 25 MG tablet [id number] No TAKE 1/2 TABLET(12.5 MG) BY MOUTH DAILY  Patient not taking: Reported on 03/08/2024   Bridgette Campus, MD Not Taking Active Self, Spouse/Significant Other  Study - LIBREXIA-ACS - milvexian 25 mg or placebo tablet (PI-Stuckey) [id number] No Take 1 tablet by mouth 2 (two) times daily. Take 1 tablet by mouth twice  a day with or without food. Bring bottle back to every visit. Please contact [place] cardiology for any questions or concerns regarding the medication. For Investigational Use Only. Kristopher Pheasant, MD Taking Active Self, Spouse/Significant Other           Patient reports that she is self managing well and declines additional calls. Case closed.  Recommendation:   PCP Follow-up  as planned  Follow Up Plan:   Closing From:  Transitions of Care Program  Orpha Blade, RN, BSN, [employer]- Transition of Care Team.  [facility] [phone]

## 2024-03-14 DIAGNOSIS — G459 Transient cerebral ischemic attack, unspecified: Secondary | ICD-10-CM | POA: Diagnosis not present

## 2024-03-14 DIAGNOSIS — I749 Embolism and thrombosis of unspecified artery: Secondary | ICD-10-CM | POA: Diagnosis not present

## 2024-03-15 ENCOUNTER — Other Ambulatory Visit (HOSPITAL_COMMUNITY): Payer: Self-pay | Admitting: Internal Medicine

## 2024-03-15 ENCOUNTER — Encounter (HOSPITAL_COMMUNITY): Payer: Self-pay

## 2024-03-15 ENCOUNTER — Ambulatory Visit (HOSPITAL_COMMUNITY)
Admission: RE | Admit: 2024-03-15 | Discharge: 2024-03-15 | Disposition: A | Discharge status: Home / Self Care | Source: Ambulatory Visit | Attending: Internal Medicine | Admitting: Internal Medicine

## 2024-03-15 DIAGNOSIS — R928 Other abnormal and inconclusive findings on diagnostic imaging of breast: Secondary | ICD-10-CM | POA: Diagnosis not present

## 2024-03-15 DIAGNOSIS — N6315 Unspecified lump in the right breast, overlapping quadrants: Secondary | ICD-10-CM | POA: Diagnosis not present

## 2024-03-15 DIAGNOSIS — N6311 Unspecified lump in the right breast, upper outer quadrant: Secondary | ICD-10-CM | POA: Diagnosis not present

## 2024-03-15 DIAGNOSIS — R92333 Mammographic heterogeneous density, bilateral breasts: Secondary | ICD-10-CM | POA: Diagnosis not present

## 2024-03-15 NOTE — Research (Signed)
 Rande Bushy Informed Consent   Subject Name: Angela Rose  Subject met inclusion and exclusion criteria.  The informed consent form, study requirements and expectations were reviewed with the subject and questions and concerns were addressed prior to the signing of the consent form.  The subject verbalized understanding of the trial requirements.  The subject agreed to participate in the [place] trial and signed the informed consent at 1413 on 31/Mar/2025.  The informed consent was obtained prior to performance of any protocol-specific procedures for the subject.  A copy of the signed informed consent was given to the subject and a copy was placed in the subject's medical record.     Subject re-consented to  Version: 6.0 IRB approved: 17/Mar/2025  Polo Brisk Letisia Schwalb

## 2024-03-16 ENCOUNTER — Other Ambulatory Visit (HOSPITAL_COMMUNITY): Payer: Self-pay | Admitting: Internal Medicine

## 2024-03-16 DIAGNOSIS — R928 Other abnormal and inconclusive findings on diagnostic imaging of breast: Secondary | ICD-10-CM

## 2024-03-16 DIAGNOSIS — H34231 Retinal artery branch occlusion, right eye: Secondary | ICD-10-CM | POA: Diagnosis not present

## 2024-03-16 LAB — HM DIABETES EYE EXAM

## 2024-03-17 ENCOUNTER — Other Ambulatory Visit: Payer: Self-pay | Admitting: Nurse Practitioner

## 2024-03-17 DIAGNOSIS — E890 Postprocedural hypothyroidism: Secondary | ICD-10-CM

## 2024-03-22 DIAGNOSIS — Z006 Encounter for examination for normal comparison and control in clinical research program: Secondary | ICD-10-CM

## 2024-03-22 NOTE — Research (Signed)
 Called patient to let her know per [facility] medical monitor and PI, study intervention will not be restarted after experiencing trial endpoint. Patient verbalized understanding.

## 2024-03-23 ENCOUNTER — Other Ambulatory Visit: Payer: Self-pay | Admitting: Family Medicine

## 2024-03-27 ENCOUNTER — Ambulatory Visit (HOSPITAL_COMMUNITY)
Admission: RE | Admit: 2024-03-27 | Discharge: 2024-03-27 | Disposition: A | Discharge status: Home / Self Care | Source: Ambulatory Visit | Attending: Internal Medicine | Admitting: Internal Medicine

## 2024-03-27 DIAGNOSIS — R928 Other abnormal and inconclusive findings on diagnostic imaging of breast: Secondary | ICD-10-CM | POA: Insufficient documentation

## 2024-03-27 DIAGNOSIS — D241 Benign neoplasm of right breast: Secondary | ICD-10-CM | POA: Diagnosis not present

## 2024-03-27 DIAGNOSIS — N6341 Unspecified lump in right breast, subareolar: Secondary | ICD-10-CM | POA: Diagnosis not present

## 2024-03-27 DIAGNOSIS — N6081 Other benign mammary dysplasias of right breast: Secondary | ICD-10-CM | POA: Diagnosis not present

## 2024-03-27 DIAGNOSIS — R921 Mammographic calcification found on diagnostic imaging of breast: Secondary | ICD-10-CM | POA: Diagnosis not present

## 2024-03-27 HISTORY — PX: BREAST BIOPSY: SHX20

## 2024-03-28 LAB — SURGICAL PATHOLOGY

## 2024-03-31 ENCOUNTER — Other Ambulatory Visit: Payer: Self-pay | Admitting: Internal Medicine

## 2024-04-01 DIAGNOSIS — E119 Type 2 diabetes mellitus without complications: Secondary | ICD-10-CM | POA: Diagnosis not present

## 2024-04-03 DIAGNOSIS — Z006 Encounter for examination for normal comparison and control in clinical research program: Secondary | ICD-10-CM

## 2024-04-03 NOTE — Research (Cosign Needed Addendum)
 LIBREXIA ACS 65 REMOTE WEEK VISIT    Medication Kit Accountability Dispensed Status [] Dispensed [] Dispensed In Error [] Not Dispensed If 'Not Dispensed', provide reason: [] Medical Decision [] Medication Kit not available or damaged [] Study medication discontinued [] Visit skipped or administration skipped [] Medication kit dispensed in error Date Dispensed:  Amount Dispensed:  Return Status: [] Damaged/Returned by subject [x] Missing/Not returned by subject [] Returned by subject If not returned reasons: [] Forgot [] Lost Date Returned:  Amount Returned: Patient will return IP to me on 04/30/24 after another appointment.   Medication Numbers Returned Medication Number [id number] Date 30-Apr-2024 Amount Returned 70 Tablets Medication Number [phone] Date 30-Apr-2024 Amount Returned 70 Tablets Medication Number [id number] Date 30-Apr-2024 Amount Returned 0 Tablets Medication Number [id number] Date 30-Apr-2024 Amount Returned 70 Tablets Medication Number [id number] Date 30-Apr-2024 Amount Returned 51 Tablets Medication Number [id number] Date 30-Apr-2024 Amount Returned 70 Tablets   Compliance%: 87    Were any suspected endpoint events or adverse events experienced? [x] Yes [] No Patient experienced endpoint event on 02/29/2024 and has since been discontinued off IP and will be followed remotely.    Current Outpatient Medications:    albuterol  (VENTOLIN  HFA) 108 (90 Base) MCG/ACT inhaler, INHALE 1 TO 2 PUFFS INTO THE LUNGS EVERY 6 HOURS AS NEEDED FOR WHEEZING OR SHORTNESS OF BREATH, Disp: 18 g, Rfl: 0   aspirin  EC 81 MG tablet, Take 1 tablet (81 mg total) by mouth daily. Swallow whole., Disp: 30 tablet, Rfl: 0   BD PEN NEEDLE NANO 2ND GEN 32G X 4 MM MISC, USE AS DIRECTED TO INJECT INSULIN  TWICE DAILY, Disp: 100 each, Rfl: 6   Blood Glucose Monitoring Suppl (ACCU-CHEK GUIDE ME) w/Device KIT, Use to check blood sugars twice a day , before breakfast and before bed., Disp: 1 kit, Rfl: 0   Cholecalciferol  (VITAMIN D3) 50 MCG (2000 UT) capsule, Take 2,000 Units by mouth daily., Disp: , Rfl:    clopidogrel  (PLAVIX ) 75 MG tablet, Take 1 tablet (75 mg total) by mouth daily., Disp: 90 tablet, Rfl: 1   ezetimibe  (ZETIA ) 10 MG tablet, Take 1 tablet (10 mg total) by mouth daily., Disp: 90 tablet, Rfl: 3   FLUoxetine  (PROZAC ) 40 MG capsule, TAKE 1 CAPSULE(40 MG) BY MOUTH DAILY, Disp: 90 capsule, Rfl: 3   furosemide  (LASIX ) 40 MG tablet, TAKE 1 TABLET BY MOUTH AS NEEDED FOR EDEMA(3 LB WEIGHT GAIN OVERNIGHT), Disp: 30 tablet, Rfl: 5   Glucose Blood (ACCU-CHEK GUIDE TEST VI), by In Vitro route., Disp: , Rfl:    glucose blood (ACCU-CHEK GUIDE TEST) test strip, Use to check blood sugar before breakfast and before bed., Disp: 100 each, Rfl: 11   Glucose Blood (BLOOD GLUCOSE TEST STRIPS) STRP, Use to check glucose twice daily, Disp: 100 strip, Rfl: 11   insulin  degludec (TRESIBA  FLEXTOUCH) 100 UNIT/ML FlexTouch Pen, Inject 12 Units into the skin at bedtime. DX Code E11.8 , Z79.4, Disp: 12 mL, Rfl: 3   levothyroxine  (SYNTHROID ) 50 MCG tablet, Take 1 tablet (50 mcg total) by mouth at bedtime., Disp: 90 tablet, Rfl: 1   metFORMIN  (GLUCOPHAGE -XR) 500 MG 24 hr tablet, Take 1 tablet (500 mg total) by mouth daily with breakfast. Resume on 01/18/23, Disp: 90 tablet, Rfl: 3   metoprolol  succinate (TOPROL -XL) 25 MG 24 hr tablet, Take 1 tablet (25 mg total) by mouth daily., Disp: 90 tablet, Rfl: 1   nitroGLYCERIN  (NITROSTAT ) 0.4 MG SL tablet, Place 1 tablet (0.4 mg total) under the tongue every 5 (five) minutes as needed for chest pain., Disp: 25 tablet, Rfl:  11   REPATHA  SURECLICK 140 MG/ML SOAJ, Inject 140 mg into the skin every 14 (fourteen) days., Disp: 2 mL, Rfl: 12   sacubitril -valsartan  (ENTRESTO ) 24-26 MG, TAKE 1 TABLET BY MOUTH TWICE DAILY, Disp: 180 tablet, Rfl: 1   spironolactone  (ALDACTONE ) 25 MG tablet, TAKE 1/2 TABLET(12.5 MG) BY MOUTH DAILY, Disp: 45 tablet, Rfl: 1   [Paused] Study - LIBREXIA-ACS - milvexian 25 mg  or placebo tablet (PI-Stuckey), Take 1 tablet by mouth 2 (two) times daily. Take 1 tablet by mouth twice a day with or without food. Bring bottle back to every visit. Please contact [place] cardiology for any questions or concerns regarding the medication. For Investigational Use Only. (Patient not taking: Reported on 04/03/2024), Disp: 420 tablet, Rfl: 0

## 2024-04-04 DIAGNOSIS — M791 Myalgia, unspecified site: Secondary | ICD-10-CM | POA: Diagnosis not present

## 2024-04-04 DIAGNOSIS — M545 Low back pain, unspecified: Secondary | ICD-10-CM | POA: Diagnosis not present

## 2024-04-09 ENCOUNTER — Ambulatory Visit: Attending: Physician Assistant

## 2024-04-09 DIAGNOSIS — G459 Transient cerebral ischemic attack, unspecified: Secondary | ICD-10-CM | POA: Diagnosis not present

## 2024-04-09 DIAGNOSIS — I749 Embolism and thrombosis of unspecified artery: Secondary | ICD-10-CM

## 2024-04-10 ENCOUNTER — Ambulatory Visit: Payer: Self-pay | Admitting: Physician Assistant

## 2024-04-23 ENCOUNTER — Inpatient Hospital Stay: Admitting: Diagnostic Neuroimaging

## 2024-04-25 ENCOUNTER — Other Ambulatory Visit: Payer: Self-pay

## 2024-04-30 ENCOUNTER — Ambulatory Visit (INDEPENDENT_AMBULATORY_CARE_PROVIDER_SITE_OTHER): Admitting: Diagnostic Neuroimaging

## 2024-04-30 ENCOUNTER — Encounter: Payer: Self-pay | Admitting: Diagnostic Neuroimaging

## 2024-04-30 VITALS — BP 124/76 | HR 55 | Ht 66.0 in | Wt 173.0 lb

## 2024-04-30 DIAGNOSIS — H34231 Retinal artery branch occlusion, right eye: Secondary | ICD-10-CM
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
## 2024-04-30 NOTE — Patient Instructions (Addendum)
  RIGHT Branch retinal artery occlusion (BRAO; 02/29/24) - continue aspirin  81mg  daily and plavix  75mg  daily - continue Repatha  and zetia  (cannot tolerate statin) - continue DM and BP control - 30 day cardiac monitor --> no evidence of Afib; mild heart block noted during sleep, follow up with cardiology and sleep study - follow up with ophthalmology; no driving for now due to vision disturbance until cleared by ophthalmology

## 2024-05-01 ENCOUNTER — Ambulatory Visit: Payer: 59 | Admitting: Dermatology

## 2024-05-01 DIAGNOSIS — E119 Type 2 diabetes mellitus without complications: Secondary | ICD-10-CM | POA: Diagnosis not present

## 2024-05-08 ENCOUNTER — Ambulatory Visit: Admitting: Nurse Practitioner

## 2024-05-22 ENCOUNTER — Other Ambulatory Visit: Payer: Self-pay

## 2024-05-23 ENCOUNTER — Ambulatory Visit: Admitting: Student

## 2024-05-27 DIAGNOSIS — S0003XA Contusion of scalp, initial encounter: Secondary | ICD-10-CM | POA: Diagnosis not present

## 2024-05-27 DIAGNOSIS — S0181XA Laceration without foreign body of other part of head, initial encounter: Secondary | ICD-10-CM | POA: Diagnosis not present

## 2024-05-27 DIAGNOSIS — I252 Old myocardial infarction: Secondary | ICD-10-CM | POA: Diagnosis not present

## 2024-05-27 DIAGNOSIS — E1122 Type 2 diabetes mellitus with diabetic chronic kidney disease: Secondary | ICD-10-CM | POA: Diagnosis not present

## 2024-05-27 DIAGNOSIS — S42211A Unspecified displaced fracture of surgical neck of right humerus, initial encounter for closed fracture: Secondary | ICD-10-CM | POA: Diagnosis not present

## 2024-05-27 DIAGNOSIS — S199XXA Unspecified injury of neck, initial encounter: Secondary | ICD-10-CM | POA: Diagnosis not present

## 2024-05-27 DIAGNOSIS — N189 Chronic kidney disease, unspecified: Secondary | ICD-10-CM | POA: Diagnosis not present

## 2024-05-27 DIAGNOSIS — I509 Heart failure, unspecified: Secondary | ICD-10-CM | POA: Diagnosis not present

## 2024-05-27 DIAGNOSIS — Z8673 Personal history of transient ischemic attack (TIA), and cerebral infarction without residual deficits: Secondary | ICD-10-CM | POA: Diagnosis not present

## 2024-05-30 DIAGNOSIS — S63501A Unspecified sprain of right wrist, initial encounter: Secondary | ICD-10-CM | POA: Diagnosis not present

## 2024-05-30 DIAGNOSIS — S42231A 3-part fracture of surgical neck of right humerus, initial encounter for closed fracture: Secondary | ICD-10-CM | POA: Diagnosis not present

## 2024-05-30 DIAGNOSIS — M25531 Pain in right wrist: Secondary | ICD-10-CM | POA: Diagnosis not present

## 2024-05-31 DIAGNOSIS — E119 Type 2 diabetes mellitus without complications: Secondary | ICD-10-CM | POA: Diagnosis not present

## 2024-06-05 ENCOUNTER — Ambulatory Visit: Payer: Self-pay

## 2024-06-05 ENCOUNTER — Ambulatory Visit: Admitting: Podiatry

## 2024-06-05 DIAGNOSIS — M25511 Pain in right shoulder: Secondary | ICD-10-CM | POA: Diagnosis not present

## 2024-06-05 DIAGNOSIS — M25562 Pain in left knee: Secondary | ICD-10-CM | POA: Insufficient documentation

## 2024-06-05 DIAGNOSIS — M25531 Pain in right wrist: Secondary | ICD-10-CM | POA: Diagnosis not present
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
## 2024-06-05 NOTE — Telephone Encounter (Signed)
 Appt made.

## 2024-06-06 DIAGNOSIS — M25511 Pain in right shoulder: Secondary | ICD-10-CM | POA: Diagnosis not present

## 2024-06-13 ENCOUNTER — Inpatient Hospital Stay: Admitting: Neurology

## 2024-06-14 ENCOUNTER — Other Ambulatory Visit: Payer: Self-pay

## 2024-06-14 DIAGNOSIS — S42294D Other nondisplaced fracture of upper end of right humerus, subsequent encounter for fracture with routine healing: Secondary | ICD-10-CM | POA: Diagnosis not present

## 2024-06-20 ENCOUNTER — Inpatient Hospital Stay: Payer: Self-pay | Admitting: Family Medicine

## 2024-06-22 ENCOUNTER — Ambulatory Visit: Attending: Cardiology | Admitting: Cardiology

## 2024-06-22 ENCOUNTER — Encounter: Payer: Self-pay | Admitting: Cardiology

## 2024-06-22 VITALS — BP 124/74 | HR 69 | Resp 16 | Ht 66.0 in | Wt 172.0 lb

## 2024-06-22 DIAGNOSIS — I25118 Atherosclerotic heart disease of native coronary artery with other forms of angina pectoris: Secondary | ICD-10-CM

## 2024-06-22 DIAGNOSIS — R809 Proteinuria, unspecified: Secondary | ICD-10-CM | POA: Diagnosis not present

## 2024-06-22 DIAGNOSIS — E785 Hyperlipidemia, unspecified: Secondary | ICD-10-CM

## 2024-06-22 DIAGNOSIS — H34231 Retinal artery branch occlusion, right eye: Secondary | ICD-10-CM | POA: Diagnosis not present

## 2024-06-22 DIAGNOSIS — I25708 Atherosclerosis of coronary artery bypass graft(s), unspecified, with other forms of angina pectoris: Secondary | ICD-10-CM | POA: Diagnosis not present

## 2024-06-22 DIAGNOSIS — E1122 Type 2 diabetes mellitus with diabetic chronic kidney disease: Secondary | ICD-10-CM | POA: Diagnosis not present

## 2024-06-22 DIAGNOSIS — E890 Postprocedural hypothyroidism: Secondary | ICD-10-CM | POA: Diagnosis not present

## 2024-06-22 DIAGNOSIS — D631 Anemia in chronic kidney disease: Secondary | ICD-10-CM | POA: Diagnosis not present

## 2024-06-22 DIAGNOSIS — Z794 Long term (current) use of insulin: Secondary | ICD-10-CM | POA: Diagnosis not present

## 2024-06-22 DIAGNOSIS — N1832 Chronic kidney disease, stage 3b: Secondary | ICD-10-CM | POA: Diagnosis not present

## 2024-06-22 NOTE — Progress Notes (Signed)
 Cardiology Office Note:  .   Date:  06/22/2024  ID:  Angela Rose, DOB [birth year], MRN [id number] PCP: Melvenia Manus BRAVO, MD  [facility] Providers Cardiologist:  Gordy Bergamo, MD   History of Present Illness: .   Angela Rose is a 78 y.o.  female with a hx of MI in 1993 with coronary angioplasty and stenting, CABG 1996, HTN, HLD, PAD, former smoker, carotid atherosclerosis.  She is statin intolerant and presently on Repatha  and Zetia .  Patient was enrolled in [place] trial for cardiovascular health and has met the endpoint as she had stroke and hence now out of the therapy arm and now being observed only.  I had seen her on 02/13/2023 and she had complained about exertional chest pain, she was prescribed Imdur  which she never started however presented with right eye partial vision loss and felt to be TIA/stroke and aspirin  was restarted along with continuing Plavix .  Cardiac Studies relevent.    CARDIAC CATHETERIZATION 01/13/2023    99% proximal sequential SVG-OM2-d RCA: The culprit lesion appears to be the downstream segment of the sequential vein graft, however unable to cross the upstream 99% in the first leg even with his 1.0 mm balloon. Likely no percutaneous options.     ECHOCARDIOGRAM COMPLETE 03/01/2024  1. Inferior basal akinesis . Left ventricular ejection fraction, by estimation, is 50 to 55%. The left ventricle has low normal function. The left ventricle demonstrates regional wall motion abnormalities (see scoring diagram/findings for description). The left ventricular internal cavity size was mildly dilated. Left ventricular diastolic parameters were normal. 2. Right ventricular systolic function is normal. The right ventricular size is normal. 3. Cannot r/o small PFO consider bubble study if clinically indicated.  Remote extended EKG monitoring for 30 days starting 03/14/2024 for TIA: Predominant rhythm was normal sinus rhythm.  Maximum heart rate was 107 bpm  with a minimum heart rate of 44 bpm with average rate of 60 bpm.  During monitoring, there were PVCs (8% burden) and PACs (5% burden). Symptomatic events/accidental push, revealing occasional PVCs and ventricular bigeminy. There were episodes of first-degree AV block and ventricular bigeminy at nocturnal hours between 12:30 AM and 4:30 PM and episodes of idioventricular rhythm with underlying junctional escape with left bundle branch block morphology at 4:41 AM. There was no atrial fibrillation, there was no high degree AV block.   Rec: Consider sleep study to evaluate arrhythmia.       Discussed the use of AI scribe software for clinical note transcription with the patient, who gave verbal consent to proceed.  History of Present Illness Angela Rose is an 78 year old female with coronary artery disease who presents for follow-up of her cardiovascular health.  She experienced a stroke affecting her right eye, resulting in partial vision loss. Atrial fibrillation was not detected on monitoring. An eye doctor confirmed retinal involvement.  She is on Repatha  for cholesterol management, with an LDL of 40 mg/dL and an Lp(a) level of 798 mg/dL. She is reducing meat and fried food intake due to high triglycerides.  She has coronary artery disease with blocked native arteries and bypass grafts. Her medications include Repatha , aspirin , Plavix , and Zetia . She experienced back pain and shortness of breath upon waking in a cold environment, considering nitroglycerin  use. She has not filled isosorbide  mononitrate due to availability issues.  She has participated in clinical trials for 20 years and wishes to continue. She experiences fatigue and weight gain, which she attributes to blood  pressure medications. Occasional snoring is noted, and she uses albuterol  nasal spray, which has alleviated previous breathing issues.  Labs   Lab Results  Component Value Date   CHOL 126 03/01/2024   HDL 41  03/01/2024   LDLCALC 40 03/01/2024   TRIG 224 (H) 03/01/2024   CHOLHDL 3.1 03/01/2024   Lipoprotein (a)  Date/Time Value Ref Range Status  01/13/2023 03:15 AM 201.1 (H) <75.0 nmol/L Final    Comment:    (NOTE) Note:  Values greater than or equal to 75.0 nmol/L may       indicate an independent risk factor for CHD,       but must be evaluated with caution when applied       to non-Caucasian populations due to the       influence of genetic factors on Lp(a) across       ethnicities. Performed At: [facility] [address] [place], [place] [id number] [name] MD Ey:[phone]     Recent Labs    02/29/24 1852 03/01/24 0451 03/02/24 0505  NA 131* 136 136  K 4.0 3.8 4.2  CL 101 106 106  CO2 24 23 25   GLUCOSE 102* 118* 124*  BUN 26* 28* 27*  CREATININE 1.13* 1.14* 1.19*  CALCIUM  8.3* 8.3* 8.1*  GFRNONAA 49* 49* 46*    Lab Results  Component Value Date   ALT 19 09/02/2023   AST 29 09/02/2023   ALKPHOS 133 (H) 09/02/2023   BILITOT 0.4 09/02/2023      Latest Ref Rng & Units 03/02/2024    5:05 AM 03/01/2024    4:51 AM 02/29/2024    6:52 PM  CBC  WBC 4.0 - 10.5 K/uL 6.4  6.4  7.1   Hemoglobin 12.0 - 15.0 g/dL 87.7  88.4  88.2   Hematocrit 36.0 - 46.0 % 37.0  36.5  35.4   Platelets 150 - 400 K/uL 253  247  276    Lab Results  Component Value Date   HGBA1C 7.4 (H) 02/29/2024    Lab Results  Component Value Date   TSH 2.660 09/02/2023    ROS  Review of Systems  Cardiovascular:  Positive for chest pain. Negative for dyspnea on exertion and leg swelling.   Physical Exam:   VS:  BP 124/74 (BP Location: Left Arm, Patient Position: Sitting, Cuff Size: Large)   Pulse 69   Resp 16   Ht 5' 6 (1.676 m)   Wt 172 lb (78 kg)   SpO2 99%   BMI 27.76 kg/m    Wt Readings from Last 3 Encounters:  06/22/24 172 lb (78 kg)  04/30/24 173 lb (78.5 kg)  03/08/24 170 lb 3.2 oz (77.2 kg)    BP Readings from Last 3 Encounters:  06/22/24 124/74  04/30/24 124/76   03/08/24 114/68   Physical Exam Neck:     Vascular: No carotid bruit or JVD.  Cardiovascular:     Rate and Rhythm: Normal rate and regular rhythm.     Heart sounds: Normal heart sounds. No murmur heard.    No gallop.  Pulmonary:     Effort: Pulmonary effort is normal.     Breath sounds: Normal breath sounds.  Abdominal:     General: Bowel sounds are normal.     Palpations: Abdomen is soft.  Musculoskeletal:     Right lower leg: No edema.     Left lower leg: No edema.    EKG:    EKG Interpretation Date/Time:  Friday June 22 2024 13:54:28 EDT Ventricular Rate:  69 PR Interval:  336 QRS Duration:  102 QT Interval:  380 QTC Calculation: 407 R Axis:   34  Text Interpretation: EKG 06/22/2024: Sinus rhythm with first-degree block at the rate of 69 bpm, LVH with repolarization abnormality, cannot exclude high lateral ischemia. Compared to 02/29/2024, ST depression with T wave inversion slightly more prominent but overall stable. Confirmed by Khiem Gargis, Jagadeesh ([id number]) on 06/22/2024 2:40:46 PM    ASSESSMENT AND PLAN: .      ICD-10-CM   1. Coronary artery disease of bypass graft of native heart with stable angina pectoris (HCC)  I25.708 EKG 12-Lead    2. Coronary artery disease of native artery of native heart with stable angina pectoris (HCC)  I25.118     3. Hyperlipidemia LDL goal <70  E78.5     4. Retinal artery occlusion, branch, right  H34.231      Assessment & Plan Retinal artery branch occlusion, right eye Retinal artery branch occlusion in the right eye confirmed, with impaired vision from the nose down. The condition is stable with no complete vision loss. - No further intervention available at this time. - No etiology found.  - Now on Repatha  for lipid management  Atherosclerotic heart disease of native coronary artery and coronary artery bypass grafts with stable angina Atherosclerotic heart disease with blockages in native coronary arteries and bypass grafts,  experiencing stable angina. Isosorbide  mononitrate was not filled due to pharmacy issues; managing with nitroglycerin  as needed. No signs of heart failure. Recent back pain and shortness of breath, but nitroglycerin  was not required. Continues aspirin , Plavix , and Zetia . - Continue aspirin , Plavix , and Zetia . - Use nitroglycerin  as needed for chest pain.  Hyperlipidemia with elevated Lp(a) Hyperlipidemia with Lp(a) at 201 (should be <75). LDL cholesterol decreased to 40 with Repatha , which is effective in reducing Lp(a) by 25-30%. Dietary modifications include reducing meat, fried foods, and starches to manage triglycerides. - Continue Repatha . - Maintain dietary modifications to reduce meat, fried foods, and starches.   Follow up: 6 months  Signed,  Gordy Bergamo, MD, [facility] 06/22/2024, 2:41 PM [facility] [address] [place], [place] [address] Phone: [phone]. Fax:  [phone]

## 2024-06-22 NOTE — Patient Instructions (Signed)
 Medication Instructions:  No medication changes were made at this visit. Continue current regimen.   *If you need a refill on your cardiac medications before your next appointment, please call your pharmacy*  Lab Work: NONE If you have labs (blood work) drawn today and your tests are completely normal, you will receive your results only by: MyChart Message (if you have MyChart) OR A paper copy in the mail If you have any lab test that is abnormal or we need to change your treatment, we will call you to review the results.  Testing/Procedures: NONE  Follow-Up: At [facility], you and your health needs are our priority.  As part of our continuing mission to provide you with exceptional heart care, our providers are all part of one team.  This team includes your primary Cardiologist (physician) and Advanced Practice Providers or APPs (Physician Assistants and Nurse Practitioners) who all work together to provide you with the care you need, when you need it.  Your next appointment:   6 Months   Provider:   Gordy Bergamo, MD    We recommend signing up for the patient portal called MyChart.  Sign up information is provided on this After Visit Summary.  MyChart is used to connect with patients for Virtual Visits (Telemedicine).  Patients are able to view lab/test results, encounter notes, upcoming appointments, etc.  Non-urgent messages can be sent to your provider as well.   To learn more about what you can do with MyChart, go to [email or web address].

## 2024-06-23 LAB — T4, FREE: Free T4: 1.18 ng/dL (ref 0.82–1.77)

## 2024-06-23 LAB — COMPREHENSIVE METABOLIC PANEL WITH GFR
ALT: 12 IU/L (ref 0–32)
AST: 16 IU/L (ref 0–40)
Albumin: 3.7 g/dL — ABNORMAL LOW (ref 3.8–4.8)
Alkaline Phosphatase: 136 IU/L — ABNORMAL HIGH (ref 44–121)
BUN/Creatinine Ratio: 20 (ref 12–28)
BUN: 24 mg/dL (ref 8–27)
Bilirubin Total: 0.2 mg/dL (ref 0.0–1.2)
CO2: 19 mmol/L — ABNORMAL LOW (ref 20–29)
Calcium: 8.8 mg/dL (ref 8.7–10.3)
Chloride: 100 mmol/L (ref 96–106)
Creatinine, Ser: 1.21 mg/dL — ABNORMAL HIGH (ref 0.57–1.00)
Globulin, Total: 2.7 g/dL (ref 1.5–4.5)
Glucose: 329 mg/dL — ABNORMAL HIGH (ref 70–99)
Potassium: 5.3 mmol/L — ABNORMAL HIGH (ref 3.5–5.2)
Sodium: 136 mmol/L (ref 134–144)
Total Protein: 6.4 g/dL (ref 6.0–8.5)
eGFR: 45 mL/min/1.73 — ABNORMAL LOW (ref >59)

## 2024-06-23 LAB — TSH: TSH: 2.07 u[IU]/mL (ref 0.450–4.500)

## 2024-06-25 ENCOUNTER — Ambulatory Visit (INDEPENDENT_AMBULATORY_CARE_PROVIDER_SITE_OTHER)

## 2024-06-25 VITALS — BP 106/67 | HR 78 | Ht 66.0 in | Wt 170.0 lb

## 2024-06-25 DIAGNOSIS — E118 Type 2 diabetes mellitus with unspecified complications: Secondary | ICD-10-CM | POA: Diagnosis not present

## 2024-06-25 DIAGNOSIS — S42291D Other displaced fracture of upper end of right humerus, subsequent encounter for fracture with routine healing: Secondary | ICD-10-CM | POA: Diagnosis not present

## 2024-06-25 DIAGNOSIS — C439 Malignant melanoma of skin, unspecified: Secondary | ICD-10-CM | POA: Insufficient documentation

## 2024-06-25 DIAGNOSIS — Z23 Encounter for immunization: Secondary | ICD-10-CM | POA: Diagnosis not present

## 2024-06-25 DIAGNOSIS — S42291S Other displaced fracture of upper end of right humerus, sequela: Secondary | ICD-10-CM | POA: Diagnosis not present

## 2024-06-25 MED ORDER — COVID-19 MRNA VACC (MODERNA) 50 MCG/0.5ML IM SUSY: 0.5000 mL | PREFILLED_SYRINGE | Freq: Once | INTRAMUSCULAR | 0 refills | Status: DC

## 2024-06-25 MED ORDER — COVID-19 MRNA VAC-TRIS(PFIZER) 30 MCG/0.3ML IM SUSY: 0.3000 mL | PREFILLED_SYRINGE | Freq: Once | INTRAMUSCULAR | 0 refills | Status: AC

## 2024-06-25 NOTE — Progress Notes (Unsigned)
 Established Patient Office Visit  Subjective   Patient ID: Angela Rose, female    DOB: 07-17-1946  Age: 78 y.o. MRN: [id number]  Chief Complaint  Patient presents with   Care Management    Three month follow up    HPI Discussed the use of AI scribe software for clinical note transcription with the patient, who gave verbal consent to proceed.  History of Present Illness   Angela Rose is an 78 year old female who presents with a recent shoulder injury and pain. She is accompanied by her significant other, Angela Rose.  Right shoulder and hand pain and functional limitation - Sustained injury to right shoulder after being pulled by a dog - Significant pain in right shoulder and right hand - Difficulty making a fist and opening right hand - Right hand is almost as sore as right shoulder - Functional limitations in right upper extremity, requiring use of sling - Uses left arm to assist with daily activities due to right-sided injury  Decreased mobility and weight gain - Decreased activity since shoulder injury due to pain and fear of falling - Gained 10 pounds since injury - History of heart failure further limits exertional capacity  Diabetes mellitus and medication management - History of diabetes mellitus - Previous issues with medication management, including changes in metformin  prescription by prior provider - Currently under care of endocrinologist with recent adjustments to diabetes management  Gastrointestinal symptoms - Nausea and sensation of impending vomiting - Symptoms suspected to be related to pain medication - No prior history of gastrointestinal problems  Immunization status and infectious disease history - History of polio - Received all recommended vaccines, including polio vaccines in childhood - History of [place] spotted fever from tick bites - No history of Lyme disease       Patient Active Problem List   Diagnosis Date  Noted   Closed fracture of right proximal humerus 06/27/2024   Melanoma of skin (HCC) 06/25/2024   Pain in joint of right shoulder 06/05/2024   Arthralgia of left knee 06/05/2024   Pain in right wrist 06/05/2024   Branch retinal artery occlusion of right eye 03/09/2024   CAD (coronary artery disease) 02/29/2024   Stroke (HCC) 02/29/2024   Hyperkalemia 10/27/2023   SCC (squamous cell carcinoma), hand, left 10/27/2023   Cystitis 06/14/2023   Need for influenza vaccination 06/14/2023   Vitamin D  insufficiency 03/30/2023   CHF (congestive heart failure), NYHA class II, acute on chronic, combined (HCC) 23-Jan-2023   Ischemic cardiomyopathy 01-23-23   At risk for sudden cardiac death January 23, 2023   Acute ST elevation myocardial infarction (STEMI) of inferior wall (HCC) 01/13/2023   Hx of CABG 01/13/2023   Coronary artery disease involving native coronary artery of native heart with unstable angina pectoris (HCC) 01/13/2023   Coronary artery disease involving coronary bypass graft of native heart with angina pectoris (HCC) 01/13/2023   Need for immunization against influenza 09/07/2022   Chronic sinusitis 07/02/2022   Headache 07/02/2022   Chronic kidney disease, stage 3b (HCC) 05/06/2022   Fatigue 02/10/2022   Allergies 11/09/2021   Constipation 11/09/2021   Cataract, nuclear sclerotic, both eyes 11/08/2018   OAB (overactive bladder) 08/11/2018   Tinnitus aurium, bilateral 08/11/2018   Renal cyst, right 05/01/2018   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   Other tear of medial meniscus, current injury, left knee, initial encounter 12/31/2015   Type II diabetes mellitus with complication (HCC) 08/14/2015   Hyperlipidemia 08/14/2015   Coronary  artery disease involving native coronary artery of native heart without angina pectoris 08/14/2015   Basal cell carcinoma 08/11/2015   History of melanoma 08/11/2015   Major depression in partial remission (HCC) 08/08/2015   Non-proliferative diabetic  retinopathy, both eyes (HCC) 08/08/2015   History of colon polyps 08/08/2015   Hypothyroidism, postablative 08/08/2015   Carotid artery plaque 09/03/2014   Essential (primary) hypertension 09/03/2014   APC (atrial premature contractions) 09/03/2014   ROS    Objective:     BP 106/67   Pulse 78   Ht 5' 6 (1.676 m)   Wt 170 lb (77.1 kg)   SpO2 96%   BMI 27.44 kg/m  BP Readings from Last 3 Encounters:  06/25/24 106/67  06/22/24 124/74  04/30/24 124/76   Wt Readings from Last 3 Encounters:  06/25/24 170 lb (77.1 kg)  06/22/24 172 lb (78 kg)  04/30/24 173 lb (78.5 kg)     Physical Exam Vitals and nursing note reviewed.  Constitutional:      Appearance: Normal appearance.  HENT:     Head: Normocephalic.  Eyes:     Extraocular Movements: Extraocular movements intact.     Pupils: Pupils are equal, round, and reactive to light.  Cardiovascular:     Rate and Rhythm: Normal rate and regular rhythm.  Pulmonary:     Effort: Pulmonary effort is normal.     Breath sounds: Normal breath sounds.  Musculoskeletal:     Right shoulder: Deformity and tenderness present. Decreased range of motion. Decreased strength.     Left shoulder: Normal.     Cervical back: Normal range of motion and neck supple.  Neurological:     Mental Status: She is alert and oriented to person, place, and time.  Psychiatric:        Mood and Affect: Mood normal.        Thought Content: Thought content normal.    No results found for any visits on 06/25/24.    The ASCVD Risk score (Arnett DK, et al., 2019) failed to calculate for the following reasons:   The 2019 ASCVD risk score is only valid for ages 29 to 18   Risk score cannot be calculated because patient has a medical history suggesting prior/existing ASCVD    Assessment & Plan:   Problem List Items Addressed This Visit       Endocrine   Type II diabetes mellitus with complication (HCC) (Chronic)   Managed by endocrinologist. -  Continue management with endocrinologist Angela Rose.        Musculoskeletal and Integument   Closed fracture of right proximal humerus - Primary   Fracture with pain and limited mobility. Orthopedic specialist does not anticipate surgery. - Follow up with orthopedic specialist on June 27, 2024, to assess healing and determine surgery necessity. - Consider sling removal based on orthopedic evaluation. - Continue using grab rails for support in shower and bathroom.      Other Visit Diagnoses       Encounter for immunization       Relevant Orders   Flu vaccine HIGH DOSE PF(Fluzone Trivalent) (Completed)           No follow-ups on file.    Leita Longs, FNP

## 2024-06-27 DIAGNOSIS — S42201A Unspecified fracture of upper end of right humerus, initial encounter for closed fracture: Secondary | ICD-10-CM | POA: Insufficient documentation

## 2024-06-27 NOTE — Assessment & Plan Note (Signed)
 Fracture with pain and limited mobility. Orthopedic specialist does not anticipate surgery. - Follow up with orthopedic specialist on June 27, 2024, to assess healing and determine surgery necessity. - Consider sling removal based on orthopedic evaluation. - Continue using grab rails for support in shower and bathroom.

## 2024-06-27 NOTE — Assessment & Plan Note (Signed)
 Managed by endocrinologist. - Continue management with endocrinologist Benton Rio.

## 2024-06-28 ENCOUNTER — Ambulatory Visit: Admitting: Nurse Practitioner

## 2024-06-28 ENCOUNTER — Telehealth: Payer: Self-pay | Admitting: Nurse Practitioner

## 2024-06-28 DIAGNOSIS — I10 Essential (primary) hypertension: Secondary | ICD-10-CM

## 2024-06-28 DIAGNOSIS — E890 Postprocedural hypothyroidism: Secondary | ICD-10-CM

## 2024-06-28 DIAGNOSIS — E1122 Type 2 diabetes mellitus with diabetic chronic kidney disease: Secondary | ICD-10-CM

## 2024-06-28 DIAGNOSIS — Z794 Long term (current) use of insulin: Secondary | ICD-10-CM

## 2024-06-28 DIAGNOSIS — S42294D Other nondisplaced fracture of upper end of right humerus, subsequent encounter for fracture with routine healing: Secondary | ICD-10-CM | POA: Diagnosis not present

## 2024-06-28 DIAGNOSIS — Z7984 Long term (current) use of oral hypoglycemic drugs: Secondary | ICD-10-CM

## 2024-06-28 NOTE — Telephone Encounter (Signed)
 Pt moved appt to 09/04/24 due to a broken arm, does she need to redo labs

## 2024-06-28 NOTE — Telephone Encounter (Signed)
 No, she wont need to repeat labs before that visit.

## 2024-06-28 NOTE — Telephone Encounter (Signed)
 Sent pt mychart message to let her know she does not need to repeat labs

## 2024-06-29 ENCOUNTER — Other Ambulatory Visit: Payer: Self-pay

## 2024-06-29 MED ORDER — SPIRONOLACTONE 25 MG PO TABS: 12.5000 mg | ORAL_TABLET | Freq: Every day | ORAL | 3 refills | Status: AC

## 2024-06-30 DIAGNOSIS — E119 Type 2 diabetes mellitus without complications: Secondary | ICD-10-CM | POA: Diagnosis not present

## 2024-07-02 ENCOUNTER — Ambulatory Visit (INDEPENDENT_AMBULATORY_CARE_PROVIDER_SITE_OTHER): Admitting: Podiatry

## 2024-07-02 ENCOUNTER — Encounter: Payer: Self-pay | Admitting: Podiatry

## 2024-07-02 ENCOUNTER — Ambulatory Visit: Admitting: Dermatology

## 2024-07-02 ENCOUNTER — Encounter: Payer: Self-pay | Admitting: Dermatology

## 2024-07-02 VITALS — BP 139/63 | HR 73

## 2024-07-02 DIAGNOSIS — M79674 Pain in right toe(s): Secondary | ICD-10-CM

## 2024-07-02 DIAGNOSIS — L570 Actinic keratosis: Secondary | ICD-10-CM | POA: Diagnosis not present

## 2024-07-02 DIAGNOSIS — B351 Tinea unguium: Secondary | ICD-10-CM

## 2024-07-02 DIAGNOSIS — D229 Melanocytic nevi, unspecified: Secondary | ICD-10-CM

## 2024-07-02 DIAGNOSIS — L578 Other skin changes due to chronic exposure to nonionizing radiation: Secondary | ICD-10-CM | POA: Diagnosis not present

## 2024-07-02 DIAGNOSIS — Z1283 Encounter for screening for malignant neoplasm of skin: Secondary | ICD-10-CM

## 2024-07-02 DIAGNOSIS — E118 Type 2 diabetes mellitus with unspecified complications: Secondary | ICD-10-CM | POA: Diagnosis not present

## 2024-07-02 DIAGNOSIS — Z85828 Personal history of other malignant neoplasm of skin: Secondary | ICD-10-CM

## 2024-07-02 DIAGNOSIS — W908XXA Exposure to other nonionizing radiation, initial encounter: Secondary | ICD-10-CM

## 2024-07-02 DIAGNOSIS — D485 Neoplasm of uncertain behavior of skin: Secondary | ICD-10-CM | POA: Diagnosis not present

## 2024-07-02 DIAGNOSIS — L821 Other seborrheic keratosis: Secondary | ICD-10-CM

## 2024-07-02 DIAGNOSIS — D0439 Carcinoma in situ of skin of other parts of face: Secondary | ICD-10-CM | POA: Diagnosis not present

## 2024-07-02 DIAGNOSIS — Z85820 Personal history of malignant melanoma of skin: Secondary | ICD-10-CM | POA: Diagnosis not present

## 2024-07-02 DIAGNOSIS — L905 Scar conditions and fibrosis of skin: Secondary | ICD-10-CM

## 2024-07-02 DIAGNOSIS — D1801 Hemangioma of skin and subcutaneous tissue: Secondary | ICD-10-CM | POA: Diagnosis not present

## 2024-07-02 DIAGNOSIS — M79675 Pain in left toe(s): Secondary | ICD-10-CM | POA: Diagnosis not present

## 2024-07-02 DIAGNOSIS — L814 Other melanin hyperpigmentation: Secondary | ICD-10-CM

## 2024-07-02 NOTE — Progress Notes (Signed)
 This patient returns to my office for at risk foot care.  This patient requires this care by a professional since this patient will be at risk due to having diabetes.  This patient is unable to cut nails herself since the patient cannot reach her nails.These nails are painful walking and wearing shoes.  This patient presents for at risk foot care today.  General Appearance  Alert, conversant and in no acute stress.  Vascular  Dorsalis pedis and posterior tibial  pulses are palpable  bilaterally.  Capillary return is within normal limits  bilaterally. Temperature is within normal limits  bilaterally.  Neurologic  Senn-Weinstein monofilament wire test within normal limits  bilaterally. Muscle power within normal limits bilaterally.  Nails Thick disfigured discolored nails with subungual debris  from hallux to fifth toes bilaterally. No evidence of bacterial infection or drainage bilaterally.  Orthopedic  No limitations of motion  feet .  No crepitus or effusions noted.  No bony pathology or digital deformities noted.  Skin  normotropic skin with no porokeratosis noted bilaterally.  No signs of infections or ulcers noted.     Onychomycosis  Pain in right toes  Pain in left toes  Consent was obtained for treatment procedures.   Mechanical debridement of nails 1-5  bilaterally performed with a nail nipper.  Filed with dremel without incident.    Return office visit   4 months                   Told patient to return for periodic foot care and evaluation due to potential at risk complications.   Cordella Bold DPM

## 2024-07-02 NOTE — Progress Notes (Signed)
 Follow-Up Visit   Subjective  Angela Rose is a 78 y.o. female who presents for the following: Skin Cancer Screening and Full Body Skin Exam  The patient presents for Total-Body Skin Exam (TBSE) for skin cancer screening and mole check. The patient has spots, moles and lesions to be evaluated, some may be new or changing.  Patient experienced a traumatic fall over the summer where she was dragged by some dogs and has a scar on her right forehead.   The following portions of the chart were reviewed this encounter and updated as appropriate: medications, allergies, medical history  Review of Systems:  No other skin or systemic complaints except as noted in HPI or Assessment and Plan.  Objective  Well appearing patient in no apparent distress; mood and affect are within normal limits.  A full examination was performed including scalp, head, eyes, ears, nose, lips, neck, chest, axillae, abdomen, back, buttocks, bilateral upper extremities, bilateral lower extremities, hands, feet, fingers, toes, fingernails, and toenails. All findings within normal limits unless otherwise noted below.   Relevant physical exam findings are noted in the Assessment and Plan.  Left Forehead 4mm pink crusted papule   Left Temple, Right Buccal Cheek Erythematous thin papules/macules with gritty scale.   Assessment & Plan   SKIN CANCER SCREENING PERFORMED TODAY.  ACTINIC DAMAGE - Chronic condition, secondary to cumulative UV/sun exposure - diffuse scaly erythematous macules with underlying dyspigmentation - Recommend daily broad spectrum sunscreen SPF 30+ to sun-exposed areas, reapply every 2 hours as needed.  - Staying in the shade or wearing long sleeves, sun glasses (UVA+UVB protection) and wide brim hats (4-inch brim around the entire circumference of the hat) are also recommended for sun protection.  - Call for new or changing lesions.  LENTIGINES, SEBORRHEIC KERATOSES, HEMANGIOMAS - Benign  normal skin lesions - Benign-appearing - Call for any changes  MELANOCYTIC NEVI - Tan-brown and/or pink-flesh-colored symmetric macules and papules - Benign appearing on exam today - Observation - Call clinic for new or changing moles - Recommend daily use of broad spectrum spf 30+ sunscreen to sun-exposed areas.   HISTORY OF BASAL CELL CARCINOMA OF THE SKIN left malar cheek. - No evidence of recurrence today - Recommend regular full body skin exams - Recommend daily broad spectrum sunscreen SPF 30+ to sun-exposed areas, reapply every 2 hours as needed.  - Call if any new or changing lesions are noted between office visits   Scar- right forehead - From traumatic fall - Healing well   HISTORY OF MELANOMA right forearm - No evidence of recurrence today - No lymphadenopathy - Recommend regular full body skin exams - Recommend daily broad spectrum sunscreen SPF 30+ to sun-exposed areas, reapply every 2 hours as needed.  - Call if any new or changing lesions are noted between office visits   NEOPLASM OF UNCERTAIN BEHAVIOR OF SKIN Left Forehead Skin / nail biopsy Type of biopsy: tangential   Informed consent: discussed and consent obtained   Timeout: patient name, date of birth, surgical site, and procedure verified   Procedure prep:  Patient was prepped and draped in usual sterile fashion Prep type:  Isopropyl alcohol Anesthesia: the lesion was anesthetized in a standard fashion   Anesthetic:  1% lidocaine  w/ epinephrine  1-100,000 buffered w/ 8.4% NaHCO3 Instrument used: DermaBlade   Hemostasis achieved with: aluminum chloride   Outcome: patient tolerated procedure well   Post-procedure details: sterile dressing applied and wound care instructions given   Dressing type: petrolatum gauze  and bandage    Specimen 1 - Surgical pathology Differential Diagnosis: r/o NMSC vs other  Check Margins: No AK (ACTINIC KERATOSIS) (2) Left Temple, Right Buccal Cheek Destruction of lesion  - Left Temple, Right Buccal Cheek Complexity: simple   Destruction method: cryotherapy   Informed consent: discussed and consent obtained   Timeout:  patient name, date of birth, surgical site, and procedure verified Lesion destroyed using liquid nitrogen: Yes   Region frozen until ice ball extended beyond lesion: Yes   Outcome: patient tolerated procedure well with no complications   Post-procedure details: wound care instructions given    ACTINIC SKIN DAMAGE   MULTIPLE BENIGN NEVI   LENTIGINES   SEBORRHEIC KERATOSES   CHERRY ANGIOMA   HISTORY OF NONMELANOMA SKIN CANCER   HISTORY OF MELANOMA   SCAR   Return in about 6 months (around 12/30/2024) for TBSC.  I, Berwyn Lesches, Surg Tech III, am acting as scribe for RUFUS CHRISTELLA HOLY, MD.   Documentation: I have reviewed the above documentation for accuracy and completeness, and I agree with the above.  RUFUS CHRISTELLA HOLY, MD

## 2024-07-02 NOTE — Patient Instructions (Signed)

## 2024-07-03 ENCOUNTER — Ambulatory Visit: Payer: Self-pay | Admitting: Dermatology

## 2024-07-03 LAB — SURGICAL PATHOLOGY

## 2024-07-07 ENCOUNTER — Other Ambulatory Visit: Payer: Self-pay | Admitting: Family Medicine

## 2024-07-09 DIAGNOSIS — E1122 Type 2 diabetes mellitus with diabetic chronic kidney disease: Secondary | ICD-10-CM | POA: Diagnosis not present

## 2024-07-09 DIAGNOSIS — E1129 Type 2 diabetes mellitus with other diabetic kidney complication: Secondary | ICD-10-CM | POA: Diagnosis not present

## 2024-07-09 DIAGNOSIS — R809 Proteinuria, unspecified: Secondary | ICD-10-CM | POA: Diagnosis not present

## 2024-07-09 DIAGNOSIS — N1832 Chronic kidney disease, stage 3b: Secondary | ICD-10-CM | POA: Diagnosis not present

## 2024-07-10 ENCOUNTER — Encounter

## 2024-07-10 ENCOUNTER — Telehealth: Payer: Self-pay | Admitting: Pharmacy Technician

## 2024-07-10 ENCOUNTER — Other Ambulatory Visit (HOSPITAL_COMMUNITY): Payer: Self-pay

## 2024-07-10 NOTE — Telephone Encounter (Signed)
 Pharmacy Patient Advocate Encounter  Received notification from [facility] that Prior Authorization for Repatha  SureClick 140MG /ML auto-injectors has been APPROVED from 07/10/2024 to 10/10/2024. Unable to obtain price due to refill too soon rejection, last fill date 07/10/2024 next available fill date10/21/2025.   PA #/Case ID/Reference #: [id number]

## 2024-07-10 NOTE — Telephone Encounter (Signed)
 Pharmacy Patient Advocate Encounter   Received notification from CoverMyMeds that prior authorization for Repatha  SureClick 140MG /ML auto-injectors is required/requested.   Insurance verification completed.   The patient is insured through [employer] .   Per test claim: PA required; PA submitted to above mentioned insurance via Latent Key/confirmation #/EOC [facility] Status is pending

## 2024-07-18 DIAGNOSIS — Z006 Encounter for examination for normal comparison and control in clinical research program: Secondary | ICD-10-CM

## 2024-07-18 NOTE — Research (Signed)
 LIBREXIA ACS 78 WEEK VISIT Remote per Librexia monitors after endpoint event    EQ-5D-5L Assessment Completed? [x] Yes [] No Reason not done: [] Subject Forgot [] Subject too ill [] Subject refused [] Technical failure [] Other If Other, Specify Collection Date: 08/Oct/2025   [facility] Hematology/Chemistry drawn: [] Yes [x] No N/A   Medication Kit Accountability Dispensed Status [] Dispensed [] Dispensed In Error [x] Not Dispensed If 'Not Dispensed', provide reason: [] Medical Decision [] Medication Kit not available or damaged [x] Study medication discontinued [] Visit skipped or administration skipped [] Medication kit dispensed in error  Compliance%: N/A   Were any suspected endpoint events or adverse events experienced? [x] Yes [] No Pt fell in [place] when walking daughter's dogs. Hit head and R arm. No surgery on R humerus. Experiencing pain/numbness/pins and needles in R hand.  Took nitroglycerin  a few days ago for what she wasn't sure was cardiac related or not. Had an upset stomach with no other symptoms. Symptoms subsided with nitroglycerin .  Per subject, had hyperkalemia per kidney doctor and was started on sodium supplements TID and Veltassa powder about 2 weeks ago on 07/04/24. Unable to find in chart.    Current Outpatient Medications:    aspirin  EC 81 MG tablet, Take 1 tablet (81 mg total) by mouth daily. Swallow whole., Disp: 30 tablet, Rfl: 0   BD PEN NEEDLE NANO 2ND GEN 32G X 4 MM MISC, USE AS DIRECTED TO INJECT INSULIN  TWICE DAILY, Disp: 100 each, Rfl: 6   Blood Glucose Monitoring Suppl (ACCU-CHEK GUIDE ME) w/Device KIT, Use to check blood sugars twice a day , before breakfast and before bed., Disp: 1 kit, Rfl: 0   clopidogrel  (PLAVIX ) 75 MG tablet, Take 1 tablet (75 mg total) by mouth daily., Disp: 90 tablet, Rfl: 1   ezetimibe  (ZETIA ) 10 MG tablet, Take 1 tablet (10 mg total) by mouth daily., Disp: 90 tablet, Rfl: 3   FLUoxetine  (PROZAC ) 40 MG capsule, TAKE 1 CAPSULE(40 MG) BY MOUTH  DAILY, Disp: 90 capsule, Rfl: 3   Glucose Blood (ACCU-CHEK GUIDE TEST VI), by In Vitro route., Disp: , Rfl:    glucose blood (ACCU-CHEK GUIDE TEST) test strip, Use to check blood sugar before breakfast and before bed., Disp: 100 each, Rfl: 11   Glucose Blood (BLOOD GLUCOSE TEST STRIPS) STRP, Use to check glucose twice daily, Disp: 100 strip, Rfl: 11   insulin  degludec (TRESIBA  FLEXTOUCH) 100 UNIT/ML FlexTouch Pen, Inject 12 Units into the skin at bedtime. DX Code E11.8 , Z79.4, Disp: 12 mL, Rfl: 3   levothyroxine  (SYNTHROID ) 50 MCG tablet, Take 1 tablet (50 mcg total) by mouth at bedtime., Disp: 90 tablet, Rfl: 1   metFORMIN  (GLUCOPHAGE -XR) 500 MG 24 hr tablet, Take 1 tablet (500 mg total) by mouth daily with breakfast. Resume on 01/18/23, Disp: 90 tablet, Rfl: 3   metoprolol  succinate (TOPROL -XL) 25 MG 24 hr tablet, Take 1 tablet (25 mg total) by mouth daily., Disp: 90 tablet, Rfl: 1   nitroGLYCERIN  (NITROSTAT ) 0.4 MG SL tablet, Place 1 tablet (0.4 mg total) under the tongue every 5 (five) minutes as needed for chest pain., Disp: 25 tablet, Rfl: 11   REPATHA  SURECLICK 140 MG/ML SOAJ, Inject 140 mg into the skin every 14 (fourteen) days., Disp: 2 mL, Rfl: 12   sacubitril -valsartan  (ENTRESTO ) 24-26 MG, TAKE 1 TABLET BY MOUTH TWICE DAILY, Disp: 180 tablet, Rfl: 1   spironolactone  (ALDACTONE ) 25 MG tablet, Take 0.5 tablets (12.5 mg total) by mouth daily., Disp: 45 tablet, Rfl: 3   albuterol  (VENTOLIN  HFA) 108 (90 Base) MCG/ACT inhaler, INHALE 1  TO 2 PUFFS INTO THE LUNGS EVERY 6 HOURS AS NEEDED FOR WHEEZING OR SHORTNESS OF BREATH (Patient not taking: Reported on 07/18/2024), Disp: 18 g, Rfl: 0   furosemide  (LASIX ) 40 MG tablet, TAKE 1 TABLET BY MOUTH AS NEEDED FOR EDEMA(3 LB WEIGHT GAIN OVERNIGHT) (Patient not taking: Reported on 07/18/2024), Disp: 30 tablet, Rfl: 5

## 2024-07-23 ENCOUNTER — Encounter: Payer: Self-pay | Admitting: Dermatology

## 2024-07-23 ENCOUNTER — Ambulatory Visit (INDEPENDENT_AMBULATORY_CARE_PROVIDER_SITE_OTHER): Admitting: Dermatology

## 2024-07-23 VITALS — BP 120/46 | HR 71 | Temp 98.4°F

## 2024-07-23 DIAGNOSIS — L578 Other skin changes due to chronic exposure to nonionizing radiation: Secondary | ICD-10-CM | POA: Diagnosis not present

## 2024-07-23 DIAGNOSIS — C4492 Squamous cell carcinoma of skin, unspecified: Secondary | ICD-10-CM

## 2024-07-23 DIAGNOSIS — D0439 Carcinoma in situ of skin of other parts of face: Secondary | ICD-10-CM

## 2024-07-23 DIAGNOSIS — L814 Other melanin hyperpigmentation: Secondary | ICD-10-CM | POA: Diagnosis not present

## 2024-07-23 NOTE — Patient Instructions (Signed)

## 2024-07-23 NOTE — Progress Notes (Signed)
 Follow-Up Visit   Subjective  Angela Rose is a 78 y.o. female who presents for the following: Mohs of a Squamous Carcinoma in Situ on the left forehead, biopsied by Dr. Corey.   The following portions of the chart were reviewed this encounter and updated as appropriate: medications, allergies, medical history  Review of Systems:  No other skin or systemic complaints except as noted in HPI or Assessment and Plan.  Objective  Well appearing patient in no apparent distress; mood and affect are within normal limits.  A focused examination was performed of the following areas: Left forehead Relevant physical exam findings are noted in the Assessment and Plan.   Left Forehead Hyperkeratotic papule   Assessment & Plan   SQUAMOUS CELL CARCINOMA OF SKIN Left Forehead Mohs surgery  Consent obtained: written  Anticoagulation: Was the anticoagulation regimen changed prior to Mohs? No    Anesthesia: Anesthesia method: local infiltration Local anesthetic: lidocaine  1% WITH epi  Procedure Details: Timeout: pre-procedure verification complete Procedure Prep: patient was prepped and draped in usual sterile fashion Prep type: chlorhexidine  Biopsy accession number: [id number] Pre-Op diagnosis: squamous cell carcinoma SCC subtype: in situ MohsAIQ Surgical site (if tumor spans multiple areas, please select predominant area): forehead (non-eyebrow) Surgery side: left Surgical site (from skin exam): Left Forehead Pre-operative length (cm): 0.5 Pre-operative width (cm): 0.4 Indications for Mohs surgery: anatomic location where tissue conservation is critical  Micrographic Surgery Details: Post-operative length (cm): 1.3 Post-operative width (cm): 1.1 Number of Mohs stages: 1 Post surgery depth of defect: subcutaneous fat  Stage 1    Tumor features identified on Mohs section: no tumor identified  Reconstruction: Was the defect reconstructed? Yes   Was reconstruction  performed by the same Mohs surgeon? Yes   Setting of reconstruction: outpatient office When was reconstruction performed? same day Type of reconstruction: linear Linear reconstruction: complex  Skin repair Complexity:  Complex Final length (cm):  3.2 Informed consent: discussed and consent obtained   Timeout: patient name, date of birth, surgical site, and procedure verified   Procedure prep:  Patient was prepped and draped in usual sterile fashion Prep type:  Chlorhexidine  Anesthesia: the lesion was anesthetized in a standard fashion   Anesthetic:  1% lidocaine  w/ epinephrine  1-100,000 buffered w/ 8.4% NaHCO3 Reason for type of repair: reduce tension to allow closure, preserve normal anatomy, preserve normal anatomical and functional relationships, avoid adjacent structures and allow side-to-side closure without requiring a flap or graft   Undermining: area extensively undermined   Subcutaneous layers (deep stitches):  Suture size:  5-0 Suture type: Monocryl (poliglecaprone 25)   Stitches:  Buried vertical mattress Fine/surface layer approximation (top stitches):  Suture size:  6-0 Suture type: fast-absorbing plain gut   Stitches: simple running   Hemostasis achieved with: suture, pressure and electrodesiccation Outcome: patient tolerated procedure well with no complications   Post-procedure details: sterile dressing applied and wound care instructions given      Return in about 4 weeks (around 08/20/2024) for wound check.  LILLETTE Berwyn Lesches, Surg Tech III, am acting as scribe for RUFUS CHRISTELLA COREY, MD.    07/23/2024  HISTORY OF PRESENT ILLNESS  Angela Rose is seen in consultation at the request of Dr. Corey for biopsy-proven Squamous Carcinoma in Situ on the left forehead. They note that the area has been present for about 2 months increasing in size with time.  There is no history of previous treatment.  Reports no other new or changing lesions and has  no other  complaints today.  Medications and allergies: see patient chart.  Review of systems: Reviewed 8 systems and notable for the above skin cancer.  All other systems reviewed are unremarkable/negative, unless noted in the HPI. Past medical history, surgical history, family history, social history were also reviewed and are noted in the chart/questionnaire.    PHYSICAL EXAMINATION  General: Well-appearing, in no acute distress, alert and oriented x 4. Vitals reviewed in chart (if available).   Skin: Exam reveals a 0.5 x 0.4 cm erythematous papule and biopsy scar on the left forehead. There are rhytids, telangiectasias, and lentigines, consistent with photodamage.  Biopsy report(s) reviewed, confirming the diagnosis.   ASSESSMENT  1) Squamous Carcinoma in Situ on the left forehead 2) photodamage 3) solar lentigines   PLAN   1. Due to location, size, histology, or recurrence and the likelihood of subclinical extension as well as the need to conserve normal surrounding tissue, the patient was deemed acceptable for Mohs micrographic surgery (MMS).  The nature and purpose of the procedure, associated benefits and risks including recurrence and scarring, possible complications such as pain, infection, and bleeding, and alternative methods of treatment if appropriate were discussed with the patient during consent. The lesion location was verified by the patient, by reviewing previous notes, pathology reports, and by photographs as well as angulation measurements if available.  Informed consent was reviewed and signed by the patient, and timeout was performed at 9:30 AM. See op note below.  2. For the photodamage and solar lentigines, sun protection discussed/information given on OTC sunscreens, and we recommend continued regular follow-up with primary dermatologist every 6 months or sooner for any growing, bleeding, or changing lesions. 3. Prognosis and future surveillance discussed. 4. Letter with  treatment outcome sent to referring provider. 5. Pain acetaminophen /ibuprofen  MOHS MICROGRAPHIC SURGERY AND RECONSTRUCTION  Initial size:   0.5 x 0.4 cm Surgical defect/wound size: 1.3 x 1.1 cm Anesthesia:    0.33% lidocaine  with 1:200,000 epinephrine  EBL:    <5 mL Complications:  None Repair type:   Complex SQ suture:   5-0 Monocryl Cutaneous suture:  6-0 Plain gut Final size of the repair: 3.2 cm  Stages: 1  STAGE I: Anesthesia achieved with 0.5% lidocaine  with 1:200,000 epinephrine . ChloraPrep applied. 1 section(s) excised using Mohs technique (this includes total peripheral and deep tissue margin excision and evaluation with frozen sections, excised and interpreted by the same physician). The tumor was first debulked and then excised with an approx. 2mm margin.  Hemostasis was achieved with electrocautery as needed.  The specimen was then oriented, subdivided/relaxed, inked, and processed using Mohs technique.    Frozen section analysis revealed a clear deep and peripheral margin.   Reconstruction  The surgical wound was then cleaned, prepped, and re-anesthetized as above. Wound edges were undermined extensively along at least one entire edge and at a distance equal to or greater than the width of the defect (see wound defect size above) in order to achieve closure and decrease wound tension and anatomic distortion. Redundant tissue repair including standing cone removal was performed. Hemostasis was achieved with electrocautery. Subcutaneous and epidermal tissues were approximated with the above sutures. The surgical site was then lightly scrubbed with sterile, saline-soaked gauze. The area was then bandaged using Vaseline ointment, non-adherent gauze, gauze pads, and tape to provide an adequate pressure dressing. The patient tolerated the procedure well, was given detailed written and verbal wound care instructions, and was discharged in good condition.   The patient  will follow-up:  4 weeks.    Documentation: I have reviewed the above documentation for accuracy and completeness, and I agree with the above.  RUFUS CHRISTELLA HOLY, MD

## 2024-07-26 ENCOUNTER — Other Ambulatory Visit: Payer: Self-pay

## 2024-07-30 ENCOUNTER — Encounter: Payer: Self-pay | Admitting: Dermatology

## 2024-07-30 DIAGNOSIS — E119 Type 2 diabetes mellitus without complications: Secondary | ICD-10-CM | POA: Diagnosis not present

## 2024-07-30 MED ORDER — SACUBITRIL-VALSARTAN 24-26 MG PO TABS: 1.0000 | ORAL_TABLET | Freq: Two times a day (BID) | ORAL | 3 refills | Status: AC

## 2024-07-31 ENCOUNTER — Other Ambulatory Visit: Payer: Self-pay

## 2024-08-09 ENCOUNTER — Other Ambulatory Visit: Payer: Self-pay | Admitting: Cardiology

## 2024-08-09 MED ORDER — METOPROLOL SUCCINATE ER 25 MG PO TB24: 25.0000 mg | ORAL_TABLET | Freq: Every day | ORAL | 3 refills | Status: AC

## 2024-08-09 MED ORDER — CLOPIDOGREL BISULFATE 75 MG PO TABS: 75.0000 mg | ORAL_TABLET | Freq: Every day | ORAL | 3 refills | Status: AC

## 2024-08-10 DIAGNOSIS — N1831 Chronic kidney disease, stage 3a: Secondary | ICD-10-CM | POA: Diagnosis not present

## 2024-08-10 DIAGNOSIS — R809 Proteinuria, unspecified: Secondary | ICD-10-CM | POA: Diagnosis not present

## 2024-08-10 DIAGNOSIS — I129 Hypertensive chronic kidney disease with stage 1 through stage 4 chronic kidney disease, or unspecified chronic kidney disease: Secondary | ICD-10-CM | POA: Diagnosis not present

## 2024-08-10 DIAGNOSIS — E1122 Type 2 diabetes mellitus with diabetic chronic kidney disease: Secondary | ICD-10-CM | POA: Diagnosis not present

## 2024-08-15 ENCOUNTER — Other Ambulatory Visit: Payer: Self-pay

## 2024-08-16 MED ORDER — FUROSEMIDE 40 MG PO TABS: ORAL_TABLET | ORAL | 11 refills | Status: AC

## 2024-08-22 ENCOUNTER — Encounter: Payer: Self-pay | Admitting: Dermatology

## 2024-08-22 ENCOUNTER — Telehealth: Payer: Self-pay

## 2024-08-22 ENCOUNTER — Ambulatory Visit: Admitting: Dermatology

## 2024-08-22 VITALS — BP 120/61 | HR 66 | Temp 98.2°F

## 2024-08-22 DIAGNOSIS — L570 Actinic keratosis: Secondary | ICD-10-CM

## 2024-08-22 DIAGNOSIS — C4492 Squamous cell carcinoma of skin, unspecified: Secondary | ICD-10-CM

## 2024-08-22 DIAGNOSIS — Z85828 Personal history of other malignant neoplasm of skin: Secondary | ICD-10-CM

## 2024-08-22 DIAGNOSIS — W908XXA Exposure to other nonionizing radiation, initial encounter: Secondary | ICD-10-CM | POA: Diagnosis not present

## 2024-08-22 DIAGNOSIS — L905 Scar conditions and fibrosis of skin: Secondary | ICD-10-CM

## 2024-08-22 NOTE — Patient Instructions (Signed)

## 2024-08-22 NOTE — Telephone Encounter (Signed)
 Copied from CRM [id number]. Topic: Clinical - Medical Advice >> Aug 22, 2024 11:50 AM Winona SAUNDERS wrote: [facility] calling to verify if pt has an chronic conditions. Ref  number: [id number]

## 2024-08-22 NOTE — Progress Notes (Signed)
   Follow Up Visit   Subjective  Angela Rose is a 78 y.o. female who presents for the following: follow up from Mohs surgery   The patient presents for follow up from Mohs surgery for a SCC on the left forehead, treated on 07/23/24, repaired with linear closure. The patient has been bandaging the wound as directed. The endorse the following concerns: none  The following portions of the chart were reviewed this encounter and updated as appropriate: medications, allergies, medical history  Review of Systems:  No other skin or systemic complaints except as noted in HPI or Assessment and Plan.  Objective  Well appearing patient in no apparent distress; mood and affect are within normal limits.  A focal examination was performed including face All findings within normal limits unless otherwise noted below.  Healing wound with mild erythema  Relevant physical exam findings are noted in the Assessment and Plan.   Left Buccal Cheek Erythematous thin papules/macules with gritty scale.   Assessment & Plan   Scar s/p Mohs for SCC on the left forehead, treated on 07/23/24, repaired with second intention - Reassured that wound is healing well - No evidence of infection - No swelling, induration, purulence, dehiscence, or tenderness out of proportion to the clinical exam, see photo above - Discussed that scars take up to 12 months to mature from the date of surgery - Recommend SPF 30+ to scar daily to prevent purple color from UV exposure during scar maturation process - Discussed that erythema and raised appearance of scar will fade over the next 4-6 months - OK to start scar massage at 4-6 weeks post-op - Can consider silicone based products for scar healing starting at 6 weeks post-op  HISTORY OF SQUAMOUS CELL CARCINOMA OF THE SKIN - No evidence of recurrence today - Recommend regular full body skin exams - Recommend daily broad spectrum sunscreen SPF 30+ to sun-exposed areas,  reapply every 2 hours as needed.  - Call if any new or changing lesions are noted between office visits  AK (ACTINIC KERATOSIS) Left Buccal Cheek Destruction of lesion - Left Buccal Cheek Complexity: simple   Destruction method: cryotherapy   Informed consent: discussed and consent obtained   Timeout:  patient name, date of birth, surgical site, and procedure verified Lesion destroyed using liquid nitrogen: Yes   Region frozen until ice ball extended beyond lesion: Yes   Outcome: patient tolerated procedure well with no complications   Post-procedure details: wound care instructions given     Return in about 2 months (around 10/22/2024) for AK Follow up.  I, Doyce Pan, CMA, am acting as scribe for RUFUS CHRISTELLA HOLY, MD.   Documentation: I have reviewed the above documentation for accuracy and completeness, and I agree with the above.  RUFUS CHRISTELLA HOLY, MD

## 2024-08-23 ENCOUNTER — Other Ambulatory Visit: Payer: Self-pay | Admitting: Internal Medicine

## 2024-08-23 NOTE — Telephone Encounter (Signed)
No call back number given.

## 2024-08-23 NOTE — Telephone Encounter (Signed)
 Yes, she has several chronic conditions

## 2024-08-31 LAB — COMPREHENSIVE METABOLIC PANEL WITH GFR
ALT: 9 IU/L (ref 0–32)
AST: 16 IU/L (ref 0–40)
Albumin: 4.2 g/dL (ref 3.8–4.8)
Alkaline Phosphatase: 105 IU/L (ref 49–135)
BUN/Creatinine Ratio: 13 (ref 12–28)
BUN: 15 mg/dL (ref 8–27)
Bilirubin Total: 0.5 mg/dL (ref 0.0–1.2)
CO2: 27 mmol/L (ref 20–29)
Calcium: 9.3 mg/dL (ref 8.7–10.3)
Chloride: 98 mmol/L (ref 96–106)
Creatinine, Ser: 1.12 mg/dL — ABNORMAL HIGH (ref 0.57–1.00)
Globulin, Total: 2.8 g/dL (ref 1.5–4.5)
Glucose: 246 mg/dL — ABNORMAL HIGH (ref 70–99)
Potassium: 4.8 mmol/L (ref 3.5–5.2)
Sodium: 139 mmol/L (ref 134–144)
Total Protein: 7 g/dL (ref 6.0–8.5)
eGFR: 50 mL/min/1.73 — ABNORMAL LOW (ref >59)

## 2024-08-31 LAB — T4, FREE: Free T4: 1.34 ng/dL (ref 0.82–1.77)

## 2024-08-31 LAB — TSH: TSH: 2.43 u[IU]/mL (ref 0.450–4.500)

## 2024-09-03 ENCOUNTER — Telehealth: Payer: Self-pay | Admitting: Cardiology

## 2024-09-03 DIAGNOSIS — Z006 Encounter for examination for normal comparison and control in clinical research program: Secondary | ICD-10-CM

## 2024-09-03 NOTE — Telephone Encounter (Signed)
  Patient has been off of aspirin  and clopidogrel  (PLAVIX ) 75 MG tablet  for the past 2 weeks. She is scheduled for nerve block injections in her right arm and shoulder the first week of December. She will not go back on the blood thinner until after the procedure. She has Dr Laqueta at [facility] is doing the procedure.

## 2024-09-03 NOTE — Telephone Encounter (Signed)
 fyi

## 2024-09-03 NOTE — Research (Addendum)
 [facility] END OF STUDY PHONE CALL    Con meds: [x] Yes [] No Stopped taking ASA and plavix  2 weeks ago on 10/Nov/2025 because of nerve block needed in neck. Advised to follow up with cardiology office for this.    EQ-5D-5L Assessment Completed? [x] Yes [] No Reason not done: [] Subject Forgot [] Subject too ill [] Subject refused [] Technical failure [] Other If Other, Specify Collection Date: 24/Nov/2025   Were any suspected endpoint events or adverse events experienced? [] Yes [x] No      Current Outpatient Medications:    albuterol  (VENTOLIN  HFA) 108 (90 Base) MCG/ACT inhaler, INHALE 1 TO 2 PUFFS INTO THE LUNGS EVERY 6 HOURS AS NEEDED FOR WHEEZING OR SHORTNESS OF BREATH, Disp: 18 g, Rfl: 0   aspirin  EC 81 MG tablet, Take 1 tablet (81 mg total) by mouth daily. Swallow whole., Disp: 30 tablet, Rfl: 0   BD PEN NEEDLE NANO 2ND GEN 32G X 4 MM MISC, USE AS DIRECTED TO INJECT INSULIN  TWICE DAILY, Disp: 100 each, Rfl: 6   Blood Glucose Monitoring Suppl (ACCU-CHEK GUIDE ME) w/Device KIT, Use to check blood sugars twice a day , before breakfast and before bed., Disp: 1 kit, Rfl: 0   clopidogrel  (PLAVIX ) 75 MG tablet, Take 1 tablet (75 mg total) by mouth daily., Disp: 90 tablet, Rfl: 3   ezetimibe  (ZETIA ) 10 MG tablet, Take 1 tablet (10 mg total) by mouth daily., Disp: 90 tablet, Rfl: 3   FLUoxetine  (PROZAC ) 40 MG capsule, TAKE 1 CAPSULE(40 MG) BY MOUTH DAILY, Disp: 90 capsule, Rfl: 3   furosemide  (LASIX ) 40 MG tablet, Take 1 tablet by mouth daily as needed for edema (3 lb weight gain over night), Disp: 30 tablet, Rfl: 11   Glucose Blood (ACCU-CHEK GUIDE TEST VI), by In Vitro route., Disp: , Rfl:    glucose blood (ACCU-CHEK GUIDE TEST) test strip, Use to check blood sugar before breakfast and before bed., Disp: 100 each, Rfl: 11   Glucose Blood (BLOOD GLUCOSE TEST STRIPS) STRP, Use to check glucose twice daily, Disp: 100 strip, Rfl: 11   insulin  degludec (TRESIBA  FLEXTOUCH) 100 UNIT/ML FlexTouch Pen,  Inject 12 Units into the skin at bedtime. DX Code E11.8 , Z79.4, Disp: 12 mL, Rfl: 3   levothyroxine  (SYNTHROID ) 50 MCG tablet, Take 1 tablet (50 mcg total) by mouth at bedtime., Disp: 90 tablet, Rfl: 1   metFORMIN  (GLUCOPHAGE -XR) 500 MG 24 hr tablet, Take 1 tablet (500 mg total) by mouth daily with breakfast. Resume on 01/18/23, Disp: 90 tablet, Rfl: 3   metoprolol  succinate (TOPROL -XL) 25 MG 24 hr tablet, Take 1 tablet (25 mg total) by mouth daily., Disp: 90 tablet, Rfl: 3   nitroGLYCERIN  (NITROSTAT ) 0.4 MG SL tablet, Place 1 tablet (0.4 mg total) under the tongue every 5 (five) minutes as needed for chest pain., Disp: 25 tablet, Rfl: 11   REPATHA  SURECLICK 140 MG/ML SOAJ, Inject 140 mg into the skin every 14 (fourteen) days., Disp: 2 mL, Rfl: 12   sacubitril -valsartan  (ENTRESTO ) 24-26 MG, Take 1 tablet by mouth 2 (two) times daily., Disp: 180 tablet, Rfl: 3   spironolactone  (ALDACTONE ) 25 MG tablet, Take 0.5 tablets (12.5 mg total) by mouth daily., Disp: 45 tablet, Rfl: 3

## 2024-09-04 ENCOUNTER — Encounter: Payer: Self-pay | Admitting: Nurse Practitioner

## 2024-09-04 ENCOUNTER — Ambulatory Visit (INDEPENDENT_AMBULATORY_CARE_PROVIDER_SITE_OTHER): Admitting: Nurse Practitioner

## 2024-09-04 VITALS — BP 108/60 | HR 69 | Ht 66.0 in | Wt 169.4 lb

## 2024-09-04 DIAGNOSIS — I10 Essential (primary) hypertension: Secondary | ICD-10-CM | POA: Diagnosis not present

## 2024-09-04 DIAGNOSIS — E118 Type 2 diabetes mellitus with unspecified complications: Secondary | ICD-10-CM

## 2024-09-04 DIAGNOSIS — E1122 Type 2 diabetes mellitus with diabetic chronic kidney disease: Secondary | ICD-10-CM

## 2024-09-04 DIAGNOSIS — Z794 Long term (current) use of insulin: Secondary | ICD-10-CM | POA: Diagnosis not present

## 2024-09-04 DIAGNOSIS — E890 Postprocedural hypothyroidism: Secondary | ICD-10-CM

## 2024-09-04 DIAGNOSIS — N1832 Chronic kidney disease, stage 3b: Secondary | ICD-10-CM

## 2024-09-04 DIAGNOSIS — Z7984 Long term (current) use of oral hypoglycemic drugs: Secondary | ICD-10-CM

## 2024-09-04 LAB — POCT GLYCOSYLATED HEMOGLOBIN (HGB A1C): Hemoglobin A1C: 8.3 % — AB (ref 4.0–5.6)

## 2024-09-04 MED ORDER — LEVOTHYROXINE SODIUM 50 MCG PO TABS: 50.0000 ug | ORAL_TABLET | Freq: Every day | ORAL | 1 refills | Status: AC

## 2024-09-04 MED ORDER — TRESIBA FLEXTOUCH 100 UNIT/ML ~~LOC~~ SOPN: 12.0000 [IU] | PEN_INJECTOR | Freq: Every day | SUBCUTANEOUS | 3 refills | Status: AC

## 2024-09-04 MED ORDER — METFORMIN HCL ER 500 MG PO TB24: 500.0000 mg | ORAL_TABLET | Freq: Every day | ORAL | 3 refills | Status: AC

## 2024-09-04 NOTE — Progress Notes (Signed)
 Endocrinology Follow Up Note       09/04/2024, 1:44 PM   Subjective:    Patient ID: Angela Rose, female    DOB: 1946/07/22.  Angela Rose is being seen in follow up after being seen in consultation for management of currently uncontrolled symptomatic diabetes requested by  Bevely Doffing, FNP.   Past Medical History:  Diagnosis Date   Acute kidney injury 05/06/2022   Allergies 2024   Anxiety 07/10/2014   Atrial premature contractions 09/03/2014   Basal cell carcinoma 01/12/2008   Branch retinal artery occlusion 02/29/2024   Cancer (HCC) 12/13/2011   melanoma face   Cataracts, bilateral 09/05/2018   CHF (congestive heart failure), NYHA class II, acute on chronic, combined (HCC) 01/14/2023   Chronic kidney disease (CKD), stage III (moderate) (HCC) 02/17/2022   Chronic sinusitis 07/02/2022   Constipation 2024   Depression 07/10/2014   Diabetes mellitus without complication (HCC) 08/21/2014   Fatigue 2024   Fracture of tibial plateau 12/31/2015   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   Headache 2024   History of colon polyps 08/08/2015   Hyperlipidemia 08/14/2015   Hypertension 09/03/2014   Hypothyroidism, postablative 02/08/2014   Ischemic cardiomyopathy 01/14/2023   Major depression in partial remission 07/10/2014   Melanoma (HCC) 12/13/2011   Myocardial infarction (HCC) 1993   Non-proliferative diabetic retinopathy, both eyes (HCC) 12/27/2014   Overactive bladder 08/11/2018   Renal cyst, right 05/01/2018   Squamous cell carcinoma of skin    Tear of medial meniscus of left knee, current, initial encounter 12/31/2015   Tick bites 10/29/2014   Tinnitus aurium, bilateral 08/11/2018   Vitamin D  deficiency 12/08/2022    Past Surgical History:  Procedure Laterality Date   ABDOMINAL HYSTERECTOMY     APPENDECTOMY     BASAL CELL CARCINOMA EXCISION     BREAST BIOPSY Right 1980   needle bx-neg    BREAST BIOPSY Right 03/27/2024   US  RT BREAST BX W LOC DEV 1ST LESION IMG BX SPEC US  GUIDE 03/27/2024 Mir, Aliene SAUNDERS, MD [facility]   BREAST BIOPSY Right 03/27/2024   US  RT BREAST BX W LOC DEV EA ADD LESION IMG BX SPEC US  GUIDE 03/27/2024 Mir, Aliene SAUNDERS, MD [facility]   CATARACT EXTRACTION, BILATERAL Bilateral 11/23/2018   CORONARY ARTERY BYPASS GRAFT  1996   x 3   CORONARY/GRAFT ACUTE MI REVASCULARIZATION N/A 01/13/2023   Procedure: Coronary/Graft Acute MI Revascularization;  Surgeon: Anner Alm ORN, MD;  Location: [facility] INVASIVE CV LAB;  Service: Cardiovascular;  Laterality: N/A;   LEFT HEART CATH AND CORONARY ANGIOGRAPHY N/A 01/13/2023   Procedure: LEFT HEART CATH AND CORONARY ANGIOGRAPHY;  Surgeon: Anner Alm ORN, MD;  Location: [facility] INVASIVE CV LAB;  Service: Cardiovascular;  Laterality: N/A;   MELANOMA EXCISION     PARTIAL HYSTERECTOMY  1976   ovaries remain    Social History   Socioeconomic History   Marital status: Significant Other    Spouse name: Not on file   Number of children: 3   Years of education: Not on file   Highest education level: Some college, no degree  Occupational History   Occupation: retired  Tobacco Use   Smoking status: Former  Current packs/day: 0.00    Types: Cigarettes    Quit date: 06/11/1982    Years since quitting: 42.2   Smokeless tobacco: Never   Tobacco comments:    smoking cessation materials not required  Vaping Use   Vaping status: Never Used  Substance and Sexual Activity   Alcohol use: Yes    Alcohol/week: 0.0 standard drinks of alcohol    Comment: rare 1 wine glass of wine   Drug use: No   Sexual activity: Not on file  Other Topics Concern   Not on file  Social History Narrative   Not on file   Social Drivers of Health   Financial Resource Strain: Low Risk  (01/18/2024)   Overall Financial Resource Strain (CARDIA)    Difficulty of Paying Living Expenses: Not hard at all  Food Insecurity: No Food Insecurity (03/06/2024)    Hunger Vital Sign    Worried About Running Out of Food in the Last Year: Never true    Ran Out of Food in the Last Year: Never true  Transportation Needs: No Transportation Needs (03/06/2024)   PRAPARE - Administrator, Civil Service (Medical): No    Lack of Transportation (Non-Medical): No  Physical Activity: Inactive (01/18/2024)   Exercise Vital Sign    Days of Exercise per Week: 0 days    Minutes of Exercise per Session: 0 min  Stress: No Stress Concern Present (01/18/2024)   [employer] of Occupational Health - Occupational Stress Questionnaire    Feeling of Stress : Not at all  Social Connections: Moderately Isolated (02/29/2024)   Social Connection and Isolation Panel    Frequency of Communication with Friends and Family: More than three times a week    Frequency of Social Gatherings with Friends and Family: Twice a week    Attends Religious Services: Never    Database Administrator or Organizations: No    Attends Engineer, Structural: Never    Marital Status: Living with partner    Family History  Problem Relation Age of Onset   Skin cancer Mother    Squamous cell carcinoma Mother    Basal cell carcinoma Mother    Heart disease Father    CAD Father    Breast cancer Neg Hx     Outpatient Encounter Medications as of 09/04/2024  Medication Sig   albuterol  (VENTOLIN  HFA) 108 (90 Base) MCG/ACT inhaler INHALE 1 TO 2 PUFFS INTO THE LUNGS EVERY 6 HOURS AS NEEDED FOR WHEEZING OR SHORTNESS OF BREATH   aspirin  EC 81 MG tablet Take 1 tablet (81 mg total) by mouth daily. Swallow whole.   BD PEN NEEDLE NANO 2ND GEN 32G X 4 MM MISC USE AS DIRECTED TO INJECT INSULIN  TWICE DAILY   Blood Glucose Monitoring Suppl (ACCU-CHEK GUIDE ME) w/Device KIT Use to check blood sugars twice a day , before breakfast and before bed.   clopidogrel  (PLAVIX ) 75 MG tablet Take 1 tablet (75 mg total) by mouth daily.   ezetimibe  (ZETIA ) 10 MG tablet Take 1 tablet (10 mg total) by  mouth daily.   FLUoxetine  (PROZAC ) 40 MG capsule TAKE 1 CAPSULE(40 MG) BY MOUTH DAILY   furosemide  (LASIX ) 40 MG tablet Take 1 tablet by mouth daily as needed for edema (3 lb weight gain over night)   Glucose Blood (ACCU-CHEK GUIDE TEST VI) by In Vitro route.   glucose blood (ACCU-CHEK GUIDE TEST) test strip Use to check blood sugar before breakfast and before bed.  Glucose Blood (BLOOD GLUCOSE TEST STRIPS) STRP Use to check glucose twice daily   HYDROcodone -acetaminophen  (NORCO/VICODIN) 5-325 MG tablet Take 1 tablet by mouth every 6 (six) hours as needed. As needed   metoprolol  succinate (TOPROL -XL) 25 MG 24 hr tablet Take 1 tablet (25 mg total) by mouth daily.   nitroGLYCERIN  (NITROSTAT ) 0.4 MG SL tablet Place 1 tablet (0.4 mg total) under the tongue every 5 (five) minutes as needed for chest pain.   REPATHA  SURECLICK 140 MG/ML SOAJ Inject 140 mg into the skin every 14 (fourteen) days.   sacubitril -valsartan  (ENTRESTO ) 24-26 MG Take 1 tablet by mouth 2 (two) times daily.   spironolactone  (ALDACTONE ) 25 MG tablet Take 0.5 tablets (12.5 mg total) by mouth daily.   VELTASSA 8.4 g packet Take 8.4 g by mouth daily. (Patient taking differently: Take 8.4 g by mouth daily. Patient states that she takes twice a week)   [DISCONTINUED] insulin  degludec (TRESIBA  FLEXTOUCH) 100 UNIT/ML FlexTouch Pen Inject 12 Units into the skin at bedtime. DX Code E11.8 , Z79.4   [DISCONTINUED] levothyroxine  (SYNTHROID ) 50 MCG tablet Take 1 tablet (50 mcg total) by mouth at bedtime.   [DISCONTINUED] metFORMIN  (GLUCOPHAGE -XR) 500 MG 24 hr tablet Take 1 tablet (500 mg total) by mouth daily with breakfast. Resume on 01/18/23   insulin  degludec (TRESIBA  FLEXTOUCH) 100 UNIT/ML FlexTouch Pen Inject 12 Units into the skin at bedtime. DX Code E11.8 , Z79.4   levothyroxine  (SYNTHROID ) 50 MCG tablet Take 1 tablet (50 mcg total) by mouth at bedtime.   metFORMIN  (GLUCOPHAGE -XR) 500 MG 24 hr tablet Take 1 tablet (500 mg total) by mouth  daily with breakfast. Resume on 01/18/23   No facility-administered encounter medications on file as of 09/04/2024.    ALLERGIES: Allergies  Allergen Reactions   Propoxyphene Nausea And Vomiting   Lidocaine -Epinephrine  (Pf) Palpitations and Other (See Comments)    Chest pain   Other Other (See Comments)    Sutures - in her hand - very irritated   Statins Other (See Comments)    Muscle pain and weakness in legs   Tape Rash    Bandaids   Xylocaine   [Lidocaine  Hcl] Palpitations and Other (See Comments)    Chest pain   Jardiance  [Empagliflozin ] Other (See Comments)    Recurrent vaginal infections    VACCINATION STATUS: Immunization History  Administered Date(s) Administered   Fluad Quad(high Dose 65+) 07/25/2019, 08/20/2020, 09/30/2021, 09/07/2022   Fluad Trivalent(High Dose 65+) 06/14/2023   INFLUENZA, HIGH DOSE SEASONAL PF 06/20/2017, 06/23/2018, 06/25/2024   Influenza,inj,Quad PF,6+ Mos 08/14/2015, 09/27/2016   PFIZER(Purple Top)SARS-COV-2 Vaccination 12/01/2019, 12/23/2019, 07/20/2020, 04/24/2021   Pneumococcal Conjugate-13 02/16/2016   Pneumococcal Polysaccharide-23 11/15/2008, 10/14/2014    Diabetes She presents for her follow-up diabetic visit. She has type 2 diabetes mellitus. Onset time: Diagnosed at approx age of 31. Her disease course has been improving. Hypoglycemia symptoms include nervousness/anxiousness, sweats and tremors. Pertinent negatives for diabetes include no fatigue, no polyuria and no weight loss. Hypoglycemia complications include nocturnal hypoglycemia. Symptoms are stable. Diabetic complications include heart disease and nephropathy. Risk factors for coronary artery disease include diabetes mellitus, dyslipidemia, family history, post-menopausal and sedentary lifestyle. Current diabetic treatment includes insulin  injections and oral agent (dual therapy). She is compliant with treatment most of the time. Her weight is stable. She is following a generally  healthy diet. When asked about meal planning, she reported none. She has not had a previous visit with a dietitian. She rarely participates in exercise. Her home blood glucose trend  is decreasing steadily. Her overall blood glucose range is 130-140 mg/dl. (She presents today with her meter showing inconsistent glucose monitoring but at goal readings overall.  Her POCT A1c today is 8.3%, increasing from last visit of 7.5%.  She notes she was particularly stressed recently.  She did fall and break her humerus, thinks she was on steroids for this temporarily.  She denies any s/s of hypoglycemia.  Analysis of her meter shows 7-day average of 142, 14-day average of 140, 30-day average of 137, 90-day average of 137.) An ACE inhibitor/angiotensin II receptor blocker is being taken. She sees a podiatrist.Eye exam is current.    Review of systems  Constitutional: + Minimally fluctuating body weight,  current Body mass index is 27.34 kg/m. , no fatigue, no subjective hyperthermia, no subjective hypothermia Eyes: no blurry vision, no xerophthalmia ENT: no sore throat, no nodules palpated in throat, no dysphagia/odynophagia, no hoarseness Cardiovascular: no chest pain, no shortness of breath, no palpitations, no leg swelling Respiratory: no cough, no shortness of breath Gastrointestinal: no nausea/vomiting/diarrhea Musculoskeletal: right shoulder pain-radiating to hand Skin: no rashes, no hyperemia Neurological: no tremors, no numbness, no tingling, no dizziness Psychiatric: no depression, no anxiety  Objective:     BP 108/60 (BP Location: Left Arm, Patient Position: Sitting)   Pulse 69   Ht 5' 6 (1.676 m)   Wt 169 lb 6.4 oz (76.8 kg)   BMI 27.34 kg/m   Wt Readings from Last 3 Encounters:  09/04/24 169 lb 6.4 oz (76.8 kg)  06/25/24 170 lb (77.1 kg)  06/22/24 172 lb (78 kg)     BP Readings from Last 3 Encounters:  09/04/24 108/60  08/22/24 120/61  07/23/24 (!) 120/46      Physical Exam-  Limited  Constitutional:  Body mass index is 27.34 kg/m. , not in acute distress, normal state of mind Eyes:  EOMI, no exophthalmos Musculoskeletal: no gross deformities, strength intact in all four extremities, no gross restriction of joint movements Skin:  no rashes, no hyperemia Neurological: no tremor with outstretched hands    CMP ( most recent) CMP     Component Value Date/Time   NA 139 08/30/2024 1156   K 4.8 08/30/2024 1156   CL 98 08/30/2024 1156   CO2 27 08/30/2024 1156   GLUCOSE 246 (H) 08/30/2024 1156   GLUCOSE 124 (H) 03/02/2024 0505   BUN 15 08/30/2024 1156   CREATININE 1.12 (H) 08/30/2024 1156   CALCIUM  9.3 08/30/2024 1156   PROT 7.0 08/30/2024 1156   ALBUMIN 4.2 08/30/2024 1156   AST 16 08/30/2024 1156   ALT 9 08/30/2024 1156   ALKPHOS 105 08/30/2024 1156   BILITOT 0.5 08/30/2024 1156   GFRNONAA 46 (L) 03/02/2024 0505   GFRAA 49 (L) 05/02/2020 1017     Diabetic Labs (most recent): Lab Results  Component Value Date   HGBA1C 8.3 (A) 09/04/2024   HGBA1C 7.4 (H) 02/29/2024   HGBA1C 7.5 (A) 01/04/2024     Lipid Panel ( most recent) Lipid Panel     Component Value Date/Time   CHOL 126 03/01/2024 0451   CHOL 200 (H) 02/13/2024 1649   TRIG 224 (H) 03/01/2024 0451   HDL 41 03/01/2024 0451   HDL 49 02/13/2024 1649   CHOLHDL 3.1 03/01/2024 0451   VLDL 45 (H) 03/01/2024 0451   LDLCALC 40 03/01/2024 0451   LDLCALC 131 (H) 02/13/2024 1649   LABVLDL 20 02/13/2024 1649      Lab Results  Component Value Date  TSH 2.430 08/30/2024   TSH 2.070 06/22/2024   TSH 2.660 09/02/2023   TSH 2.160 12/27/2022   TSH 2.300 12/06/2022   TSH 1.540 08/03/2022   TSH 3.170 02/10/2022   TSH 1.990 09/30/2021   TSH 6.140 (H) 04/27/2021   TSH 1.850 12/25/2019   FREET4 1.34 08/30/2024   FREET4 1.18 06/22/2024   FREET4 1.28 09/02/2023   FREET4 1.39 12/27/2022   FREET4 1.25 12/06/2022   FREET4 1.35 08/03/2022   FREET4 1.35 09/30/2021   FREET4 1.06 12/25/2019            Assessment & Plan:   1) Type 2 diabetes mellitus with stage 3b chronic kidney disease, with long-term current use of insulin  (HCC)  She presents today with her meter showing inconsistent glucose monitoring but at goal readings overall.  Her POCT A1c today is 8.3%, increasing from last visit of 7.5%.  She notes she was particularly stressed recently.  She did fall and break her humerus, thinks she was on steroids for this temporarily.  She denies any s/s of hypoglycemia.  Analysis of her meter shows 7-day average of 142, 14-day average of 140, 30-day average of 137, 90-day average of 137.  - Angela Rose has currently uncontrolled symptomatic type 2 DM since 78 years of age.   -Recent labs reviewed.  - I had a long discussion with her about the progressive nature of diabetes and the pathology behind its complications. -her diabetes is complicated by CAD with MI and CKD stage 3 and she remains at a high risk for more acute and chronic complications which include CAD, CVA, CKD, retinopathy, and neuropathy. These are all discussed in detail with her.  The following Lifestyle Medicine recommendations according to American College of Lifestyle Medicine [facility]) were discussed and offered to patient and she agrees to start the journey:  A. Whole Foods, Plant-based plate comprising of fruits and vegetables, plant-based proteins, whole-grain carbohydrates was discussed in detail with the patient.   A list for source of those nutrients were also provided to the patient.  Patient will use only water or unsweetened tea for hydration. B.  The need to stay away from risky substances including alcohol, smoking; obtaining 7 to 9 hours of restorative sleep, at least 150 minutes of moderate intensity exercise weekly, the importance of healthy social connections,  and stress reduction techniques were discussed. C.  A full color page of  Calorie density of various food groups per pound showing  examples of each food groups was provided to the patient.  - Nutritional counseling repeated/built upon at each appointment.  - The patient admits there is a room for improvement in their diet and drink choices. -  Suggestion is made for the patient to avoid simple carbohydrates from their diet including Cakes, Sweet Desserts / Pastries, Ice Cream, Soda (diet and regular), Sweet Tea, Candies, Chips, Cookies, Sweet Pastries, Store Bought Juices, Alcohol in Excess of 1-2 drinks a day, Artificial Sweeteners, Coffee Creamer, and Sugar-free Products. This will help patient to have stable blood glucose profile and potentially avoid unintended weight gain.   - I encouraged the patient to switch to unprocessed or minimally processed complex starch and increased protein intake (animal or plant source), fruits, and vegetables.   - Patient is advised to stick to a routine mealtimes to eat 3 meals a day and avoid unnecessary snacks (to snack only to correct hypoglycemia).  - I have approached her with the following individualized plan to manage her diabetes and  patient agrees:   - She is advised to continue her Tresiba  12 units SQ nightly and continue her Metformin  500 mg ER daily with breakfast.   -she is encouraged to continue monitoring blood glucose twice daily, before breakfast and before bed, and to call the clinic if she has readings less than 70 or above 300 for 3 tests in a row.  She is now interested in CGM.  I will send Dexcom to [facility].  - she is warned not to take insulin  without proper monitoring per orders. - Adjustment parameters are given to her for hypo and hyperglycemia in writing.  - she is not an ideal candidate for incretin therapy due to body habitus and BMI of 25.  She did not tolerate Farxiga  in the past due to frequent and recurrent UTIs.  - Specific targets for  A1c; LDL, HDL, and Triglycerides were discussed with the patient.  2) Blood Pressure /Hypertension:  her blood  pressure is controlled to target.   she is advised to continue her current medications as prescribed by cardiology.  3) Lipids/Hyperlipidemia:    Review of her recent lipid panel from 01/12/23 showed uncontrolled LDL at 133 and elevated triglycerides of 261 .  she is advised to continue Repatha  140 mg every 14 days.  Side effects and precautions discussed with her.  4)  Weight/Diet:  her Body mass index is 27.34 kg/m.  -  she is NOT a candidate for weight loss.  Exercise, and detailed carbohydrates information provided  -  detailed on discharge instructions.  5) Hypothyroidism- RAI induced She was diagnosed with hyperthyroidism over 15 years ago which subsequently led to her RAI ablation and now hypothyroidism.    Her previsit TFTs are consistent with appropriate hormone replacement.  She is advised to continue Levothyroxine  50 mcg po daily before breakfast.     - The correct intake of thyroid  hormone (Levothyroxine , Synthroid ), is on empty stomach first thing in the morning, with water, separated by at least 30 minutes from breakfast and other medications,  and separated by more than 4 hours from calcium , iron, multivitamins, acid reflux medications (PPIs).  - This medication is a life-long medication and will be needed to correct thyroid  hormone imbalances for the rest of your life.  The dose may change from time to time, based on thyroid  blood work.  - It is extremely important to be consistent taking this medication, near the same time each morning.  -AVOID TAKING PRODUCTS CONTAINING BIOTIN (commonly found in Hair, Skin, Nails vitamins) AS IT INTERFERES WITH THE VALIDITY OF THYROID  FUNCTION BLOOD TESTS.  6) Chronic Care/Health Maintenance: -she is on ACEI/ARB and not on Statin medications and is encouraged to initiate and continue to follow up with Ophthalmology, Dentist, Podiatrist at least yearly or according to recommendations, and advised to stay away from smoking. I have recommended  yearly flu vaccine and pneumonia vaccine at least every 5 years; moderate intensity exercise for up to 150 minutes weekly; and sleep for at least 7 hours a day.  - she is advised to maintain close follow up with Bevely Doffing, FNP for primary care needs, as well as her other providers for optimal and coordinated care.     I spent  49  minutes in the care of the patient today including review of labs from CMP, Lipids, Thyroid  Function, Hematology (current and previous including abstractions from other facilities); face-to-face time discussing  her blood glucose readings/logs, discussing hypoglycemia and hyperglycemia episodes and symptoms, medications doses,  her options of short and long term treatment based on the latest standards of care / guidelines;  discussion about incorporating lifestyle medicine;  and documenting the encounter. Risk reduction counseling performed per USPSTF guidelines to reduce obesity and cardiovascular risk factors.     Please refer to Patient Instructions for Blood Glucose Monitoring and Insulin /Medications Dosing Guide  in media tab for additional information. Please  also refer to  Patient Self Inventory in the Media  tab for reviewed elements of pertinent patient history.  Angela Rose participated in the discussions, expressed understanding, and voiced agreement with the above plans.  All questions were answered to her satisfaction. she is encouraged to contact clinic should she have any questions or concerns prior to her return visit.     Follow up plan: - Return in about 4 months (around 01/02/2025) for Diabetes F/U with A1c in office, No previsit labs, Bring meter and logs.   Benton Rio, [facility] [address] [place], [place] [address] Phone: [phone] Fax: [phone]  09/04/2024, 1:44 PM

## 2024-09-14 ENCOUNTER — Other Ambulatory Visit: Payer: Self-pay

## 2024-09-19 ENCOUNTER — Telehealth (HOSPITAL_BASED_OUTPATIENT_CLINIC_OR_DEPARTMENT_OTHER): Payer: Self-pay

## 2024-09-19 NOTE — Telephone Encounter (Signed)
 Left message for patient to call our office and ask for the preop team to schedule TELE Preop appt.

## 2024-09-19 NOTE — Telephone Encounter (Signed)
° °  Pre-operative Risk Assessment    Patient Name: Angela Rose  DOB: 23-Jul-1946 MRN: [id number]   Date of last office visit: 06/22/24 with Dr. Ladona Date of next office visit: NA  Request for Surgical Clearance    Procedure:  Right carpal tunnel release   Date of Surgery:  Clearance TBD                                 Surgeon:  Dr. Camella Surgeon's Group or Practice Name:  [facility] Phone number:  [phone] Fax number:  [phone]   Type of Clearance Requested:   - Medical  - Pharmacy:  Hold Aspirin  and Clopidogrel  (Plavix ) not indicated   Type of Anesthesia:  Local    Additional requests/questions:    [name]   09/19/2024, 2:36 PM

## 2024-09-19 NOTE — Telephone Encounter (Signed)
° °  Name: Angela Rose  DOB: April 03, 1946  MRN: [id number]  Primary Cardiologist: Angela Bergamo, MD   Preoperative team, please contact this patient and set up a phone call appointment for further preoperative risk assessment. Please obtain consent and complete medication review. Thank you for your help.  I confirm that guidance regarding antiplatelet and oral anticoagulation therapy has been completed and, if necessary, noted below.  Last coronary intervention was completed in 01/2023. From a cardiac standpoint, OK to hold plavix  for 3-5 days prior to procedure. Regarding ASA therapy, we recommend continuation of ASA throughout the perioperative period.  However, if the surgeon feels that cessation of ASA is required in the perioperative period, it may be stopped 5-7 days prior to surgery with a plan to resume it as soon as felt to be feasible from a surgical standpoint in the post-operative period.  Of note, patient has a history of right branch retinal artery occlusion in 02/2024. I recommend that neurology be reached out to as well about plavix  hold.   I also confirmed the patient resides in the state of [place] . As per [facility] Medical Board telemedicine laws, the patient must reside in the state in which the provider is licensed.   Angela FABIENE Louder, PA-C 09/19/2024, 2:50 PM [facility]

## 2024-09-20 ENCOUNTER — Telehealth: Payer: Self-pay

## 2024-09-20 NOTE — Telephone Encounter (Signed)
 LVM asking pt to call our office to schedule VV for preop clearance. Please reach out to preop team if pt returns call. 2nd attempt

## 2024-09-20 NOTE — Telephone Encounter (Signed)
°  Patient Consent for Virtual Visit        Angela Rose has provided verbal consent on 09/20/2024 for a virtual visit (video or telephone).   CONSENT FOR VIRTUAL VISIT FOR:  Angela Rose  By participating in this virtual visit I agree to the following:  I hereby voluntarily request, consent and authorize [facility] and its employed or contracted physicians, physician assistants, nurse practitioners or other licensed health care professionals (the Practitioner), to provide me with telemedicine health care services (the Services) as deemed necessary by the treating Practitioner. I acknowledge and consent to receive the Services by the Practitioner via telemedicine. I understand that the telemedicine visit will involve communicating with the Practitioner through live audiovisual communication technology and the disclosure of certain medical information by electronic transmission. I acknowledge that I have been given the opportunity to request an in-person assessment or other available alternative prior to the telemedicine visit and am voluntarily participating in the telemedicine visit.  I understand that I have the right to withhold or withdraw my consent to the use of telemedicine in the course of my care at any time, without affecting my right to future care or treatment, and that the Practitioner or I may terminate the telemedicine visit at any time. I understand that I have the right to inspect all information obtained and/or recorded in the course of the telemedicine visit and may receive copies of available information for a reasonable fee.  I understand that some of the potential risks of receiving the Services via telemedicine include:  Delay or interruption in medical evaluation due to technological equipment failure or disruption; Information transmitted may not be sufficient (e.g. poor resolution of images) to allow for appropriate medical decision making by the  Practitioner; and/or  In rare instances, security protocols could fail, causing a breach of personal health information.  Furthermore, I acknowledge that it is my responsibility to provide information about my medical history, conditions and care that is complete and accurate to the best of my ability. I acknowledge that Practitioner's advice, recommendations, and/or decision may be based on factors not within their control, such as incomplete or inaccurate data provided by me or distortions of diagnostic images or specimens that may result from electronic transmissions. I understand that the practice of medicine is not an exact science and that Practitioner makes no warranties or guarantees regarding treatment outcomes. I acknowledge that a copy of this consent can be made available to me via my patient portal [facility] MyChart), or I can request a printed copy by calling the office of [facility].    I understand that my insurance will be billed for this visit.   I have read or had this consent read to me. I understand the contents of this consent, which adequately explains the benefits and risks of the Services being provided via telemedicine.  I have been provided ample opportunity to ask questions regarding this consent and the Services and have had my questions answered to my satisfaction. I give my informed consent for the services to be provided through the use of telemedicine in my medical care

## 2024-09-20 NOTE — Telephone Encounter (Signed)
 Pt scheduled for VV on 12/23

## 2024-10-01 ENCOUNTER — Encounter: Payer: Self-pay | Admitting: Podiatry

## 2024-10-01 ENCOUNTER — Ambulatory Visit: Admitting: Podiatry

## 2024-10-01 DIAGNOSIS — M79675 Pain in left toe(s): Secondary | ICD-10-CM | POA: Diagnosis not present

## 2024-10-01 DIAGNOSIS — E118 Type 2 diabetes mellitus with unspecified complications: Secondary | ICD-10-CM | POA: Diagnosis not present

## 2024-10-01 DIAGNOSIS — M79674 Pain in right toe(s): Secondary | ICD-10-CM

## 2024-10-01 DIAGNOSIS — B351 Tinea unguium: Secondary | ICD-10-CM

## 2024-10-01 NOTE — Progress Notes (Signed)

## 2024-10-02 ENCOUNTER — Ambulatory Visit

## 2024-10-02 DIAGNOSIS — Z01810 Encounter for preprocedural cardiovascular examination: Secondary | ICD-10-CM

## 2024-10-02 NOTE — Progress Notes (Signed)
 "   Virtual Visit via Telephone Note   Because of Angela Rose co-morbid illnesses, she is at least at moderate risk for complications without adequate follow up.  This format is felt to be most appropriate for this patient at this time.  Due to technical limitations with video connection (technology), today's appointment will be conducted as an audio only telehealth visit, and Angela Rose verbally agreed to proceed in this manner.   All issues noted in this document were discussed and addressed.  No physical exam could be performed with this format.  Evaluation Performed:  Preoperative cardiovascular risk assessment _____________   Date:  10/02/2024   Patient ID:  Angela Rose, DOB 03-10-1946, MRN [id number] Patient Location:  Home Provider location:   Office  Primary Care Provider:  Bevely Doffing, FNP Primary Cardiologist:  Gordy Bergamo, MD  Chief Complaint / Patient Profile   78 y.o. y/o female with a h/o MI in 1993 with coronary angioplasty and stenting, CABG 1996, HTN, HLD, PAD, former smoker, carotid atherosclerosis who is pending carpal tunnel release on TBD and presents today for telephonic preoperative cardiovascular risk assessment.  History of Present Illness    Angela Rose is a 78 y.o. female who presents via audio/video conferencing for a telehealth visit today.  Pt was last seen in cardiology clinic on 06/22/2024 by Gordy Bergamo MD.  At that time Angela Rose was doing well.  The patient is now pending procedure as outlined above. Since her last visit, She denies chest pain, palpitations, dyspnea, orthopnea, n, v, dark/tarry/bloody stools, hematuria, dizziness, syncope, edema, weight gain.    Past Medical History    Past Medical History:  Diagnosis Date   Acute kidney injury 05/06/2022   Allergies 2024   Anxiety 07/10/2014   Atrial premature contractions 09/03/2014   Basal cell carcinoma 01/12/2008   Branch retinal artery occlusion  02/29/2024   Cancer (HCC) 12/13/2011   melanoma face   Cataracts, bilateral 09/05/2018   CHF (congestive heart failure), NYHA class II, acute on chronic, combined (HCC) 01/14/2023   Chronic kidney disease (CKD), stage III (moderate) (HCC) 02/17/2022   Chronic sinusitis 07/02/2022   Constipation 2024   Depression 07/10/2014   Diabetes mellitus without complication (HCC) 08/21/2014   Fatigue 2024   Fracture of tibial plateau 12/31/2015   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   Headache 2024   History of colon polyps 08/08/2015   Hyperlipidemia 08/14/2015   Hypertension 09/03/2014   Hypothyroidism, postablative 02/08/2014   Ischemic cardiomyopathy 01/14/2023   Major depression in partial remission 07/10/2014   Melanoma (HCC) 12/13/2011   Myocardial infarction (HCC) 1993   Non-proliferative diabetic retinopathy, both eyes (HCC) 12/27/2014   Overactive bladder 08/11/2018   Renal cyst, right 05/01/2018   Squamous cell carcinoma of skin    Tear of medial meniscus of left knee, current, initial encounter 12/31/2015   Tick bites 10/29/2014   Tinnitus aurium, bilateral 08/11/2018   Vitamin D  deficiency 12/08/2022   Past Surgical History:  Procedure Laterality Date   ABDOMINAL HYSTERECTOMY     APPENDECTOMY     BASAL CELL CARCINOMA EXCISION     BREAST BIOPSY Right 1980   needle bx-neg   BREAST BIOPSY Right 03/27/2024   US  RT BREAST BX W LOC DEV 1ST LESION IMG BX SPEC US  GUIDE 03/27/2024 Mir, Aliene SAUNDERS, MD AP-ULTRASOUND   BREAST BIOPSY Right 03/27/2024   US  RT BREAST BX W LOC DEV EA ADD LESION IMG BX SPEC US  GUIDE 03/27/2024  Mir, Aliene SAUNDERS, MD AP-ULTRASOUND   CATARACT EXTRACTION, BILATERAL Bilateral 11/23/2018   CORONARY ARTERY BYPASS GRAFT  1996   x 3   CORONARY/GRAFT ACUTE MI REVASCULARIZATION N/A 01/13/2023   Procedure: Coronary/Graft Acute MI Revascularization;  Surgeon: Anner Alm ORN, MD;  Location: [facility] INVASIVE CV LAB;  Service: Cardiovascular;  Laterality: N/A;   LEFT HEART CATH AND  CORONARY ANGIOGRAPHY N/A 01/13/2023   Procedure: LEFT HEART CATH AND CORONARY ANGIOGRAPHY;  Surgeon: Anner Alm ORN, MD;  Location: [facility] INVASIVE CV LAB;  Service: Cardiovascular;  Laterality: N/A;   MELANOMA EXCISION     PARTIAL HYSTERECTOMY  1976   ovaries remain    Allergies  Allergies[1]  Home Medications    Prior to Admission medications  Medication Sig Start Date End Date Taking? Authorizing Provider  albuterol  (VENTOLIN  HFA) 108 (90 Base) MCG/ACT inhaler INHALE 1 TO 2 PUFFS INTO THE LUNGS EVERY 6 HOURS AS NEEDED FOR WHEEZING OR SHORTNESS OF BREATH 09/14/24   Bevely Doffing, FNP  aspirin  EC 81 MG tablet Take 1 tablet (81 mg total) by mouth daily. Swallow whole. 03/02/24   Ezenduka, Nkeiruka J, MD  BD PEN NEEDLE NANO 2ND GEN 32G X 4 MM MISC USE AS DIRECTED TO INJECT INSULIN  TWICE DAILY 03/23/24   Dixon, Phillip E, MD  Blood Glucose Monitoring Suppl (ACCU-CHEK GUIDE ME) w/Device KIT Use to check blood sugars twice a day , before breakfast and before bed. 10/17/23   Therisa Benton PARAS, NP  clopidogrel  (PLAVIX ) 75 MG tablet Take 1 tablet (75 mg total) by mouth daily. 08/09/24   Ladona Heinz, MD  ezetimibe  (ZETIA ) 10 MG tablet Take 1 tablet (10 mg total) by mouth daily. 02/15/24   Ladona Heinz, MD  FLUoxetine  (PROZAC ) 40 MG capsule TAKE 1 CAPSULE(40 MG) BY MOUTH DAILY 02/09/24   Dixon, Phillip E, MD  furosemide  (LASIX ) 40 MG tablet Take 1 tablet by mouth daily as needed for edema (3 lb weight gain over night) 08/16/24   Ladona Heinz, MD  Glucose Blood (ACCU-CHEK GUIDE TEST VI) by In Vitro route.    [provider]  glucose blood (ACCU-CHEK GUIDE TEST) test strip Use to check blood sugar before breakfast and before bed. 10/17/23   Therisa Benton PARAS, NP  Glucose Blood (BLOOD GLUCOSE TEST STRIPS) STRP Use to check glucose twice daily 12/28/22   Therisa Benton PARAS, NP  HYDROcodone -acetaminophen  (NORCO/VICODIN) 5-325 MG tablet Take 1 tablet by mouth every 6 (six) hours as needed. As needed 08/16/24    [provider]  insulin  degludec (TRESIBA  FLEXTOUCH) 100 UNIT/ML FlexTouch Pen Inject 12 Units into the skin at bedtime. DX Code E11.8 , Z79.4 09/04/24   Therisa Benton PARAS, NP  levothyroxine  (SYNTHROID ) 50 MCG tablet Take 1 tablet (50 mcg total) by mouth at bedtime. 09/04/24   Therisa Benton PARAS, NP  metFORMIN  (GLUCOPHAGE -XR) 500 MG 24 hr tablet Take 1 tablet (500 mg total) by mouth daily with breakfast. Resume on 01/18/23 09/04/24   Therisa Benton PARAS, NP  metoprolol  succinate (TOPROL -XL) 25 MG 24 hr tablet Take 1 tablet (25 mg total) by mouth daily. 08/09/24   Ladona Heinz, MD  nitroGLYCERIN  (NITROSTAT ) 0.4 MG SL tablet Place 1 tablet (0.4 mg total) under the tongue every 5 (five) minutes as needed for chest pain. 02/29/24   Ladona Heinz, MD  REPATHA  SURECLICK 140 MG/ML SOAJ Inject 140 mg into the skin every 14 (fourteen) days. 12/14/23   Melvenia Manus BRAVO, MD  sacubitril -valsartan  (ENTRESTO ) 24-26 MG Take 1 tablet by  mouth 2 (two) times daily. 07/30/24   Ladona Heinz, MD  spironolactone  (ALDACTONE ) 25 MG tablet Take 0.5 tablets (12.5 mg total) by mouth daily. 06/29/24   Ladona Heinz, MD  VELTASSA 8.4 g packet Take 8.4 g by mouth daily. Patient taking differently: Take 8.4 g by mouth daily. Patient states that she takes twice a week    [provider]    Physical Exam    Vital Signs:  Angela Rose does not have vital signs available for review today.  Given telephonic nature of communication, physical exam is limited. AAOx3. NAD. Normal affect.  Speech and respirations are unlabored.  Accessory Clinical Findings    None  Assessment & Plan    1.  Preoperative Cardiovascular Risk Assessment: According to the Revised Cardiac Risk Index (RCRI), her Perioperative Risk of Major Cardiac Event is (%): 11  Her Functional Capacity in METs is: 4.73 according to the Duke Activity Status Index (DASI). Therefore, based on ACC/AHA guidelines, patient would be at high but acceptable risk  for the planned procedure without further cardiovascular testing.   The patient was advised that if she develops new symptoms prior to surgery to contact our office to arrange for a follow-up visit, and she verbalized understanding.  Patient reports to me she's been off plavix  x 1 month following a recent neck injection 2 weeks prior, as she was not sure when she should resume her plavix . Has been on aspirin  for the duration of the month. Recommended patient contact the provider who performed the procedure to see if it's safe to restart plavix  at this time, as we recommend she take this from a cardiac perspective. Advised patient to restart her plavix  if the surgeon says it is safe to do so.   Last coronary intervention was completed in 01/2023. From a cardiac standpoint, OK to hold plavix  for 3-5 days prior to planned upcoming procedure. Regarding ASA therapy, we recommend continuation of ASA throughout the perioperative period.  However, if the surgeon feels that cessation of ASA is required in the perioperative period, it may be stopped 5-7 days prior to surgery with a plan to resume it as soon as felt to be feasible from a surgical standpoint in the post-operative period   A copy of this note will be routed to requesting surgeon.  Time:   Today, I have spent 10 minutes with the patient with telehealth technology discussing medical history, symptoms, and management plan.     Angela Hehl E Latron Ribas, NP  10/02/2024, 8:01 AM     [1]  Allergies Allergen Reactions   Propoxyphene Nausea And Vomiting   Lidocaine -Epinephrine  (Pf) Palpitations and Other (See Comments)    Chest pain   Other Other (See Comments)    Sutures - in her hand - very irritated   Statins Other (See Comments)    Muscle pain and weakness in legs   Tape Rash    Bandaids   Xylocaine   [Lidocaine  Hcl] Palpitations and Other (See Comments)    Chest pain   Jardiance  [Empagliflozin ] Other (See Comments)    Recurrent vaginal  infections   "

## 2024-10-11 ENCOUNTER — Other Ambulatory Visit: Payer: Self-pay

## 2024-10-23 ENCOUNTER — Ambulatory Visit: Payer: 59 | Admitting: Dermatology

## 2024-10-24 ENCOUNTER — Ambulatory Visit (INDEPENDENT_AMBULATORY_CARE_PROVIDER_SITE_OTHER): Admitting: Dermatology

## 2024-10-24 ENCOUNTER — Encounter: Payer: Self-pay | Admitting: Dermatology

## 2024-10-24 DIAGNOSIS — L570 Actinic keratosis: Secondary | ICD-10-CM

## 2024-10-24 DIAGNOSIS — Z85828 Personal history of other malignant neoplasm of skin: Secondary | ICD-10-CM | POA: Diagnosis not present

## 2024-10-24 DIAGNOSIS — L905 Scar conditions and fibrosis of skin: Secondary | ICD-10-CM | POA: Diagnosis not present

## 2024-10-24 DIAGNOSIS — Z872 Personal history of diseases of the skin and subcutaneous tissue: Secondary | ICD-10-CM

## 2024-10-24 DIAGNOSIS — C4492 Squamous cell carcinoma of skin, unspecified: Secondary | ICD-10-CM

## 2024-10-24 NOTE — Patient Instructions (Signed)

## 2024-10-24 NOTE — Progress Notes (Signed)
" ° °  Follow-Up Visit   History of Present Illness Angela Rose is an 79 year old female who presents for a follow-up on a previously treated skin lesion.  The previously treated skin lesion, which was frozen, has not been found by the patient, and she considers this a positive outcome. She has no concerns about other skin spots at this time.  She reports an issue with her underarms, describing an unpleasant odor and an inability to fully raise one arm, which she believes may contribute to the problem. She has not attempted any specific treatments for this issue yet.  She is in the process of testing her new insurance coverage and is uncertain if her current provider is familiar with her new insurance plan.  She recently attended a family gathering at her daughter's house, where she spent time with her great-grandchildren.  The following portions of the chart were reviewed this encounter and updated as appropriate: medications, allergies, medical history  Review of Systems:  No other skin or systemic complaints except as noted in HPI or Assessment and Plan.  Objective  Well appearing patient in no apparent distress; mood and affect are within normal limits.  A focused examination was performed of the following areas: face  Relevant exam findings are noted in the Assessment and Plan.    Assessment & Plan   HISTORY OF PRECANCEROUS ACTINIC KERATOSIS - site(s) of PreCancerous Actinic Keratosis clear today. - these may recur and new lesions may form requiring treatment to prevent transformation into skin cancer - observe for new or changing spots and contact [facility] for appointment if occur - photoprotection with sun protective clothing; sunglasses and broad spectrum sunscreen with SPF of at least 30 + and frequent self skin exams recommended - yearly exams by a dermatologist recommended for persons with history of PreCancerous Actinic Keratoses   Scar s/p Mohs for  [facility] on the left forehead, treated on 07/23/24, repaired with second intention - Reassured that wound is healing well - No evidence of infection - No swelling, induration, purulence, dehiscence, or tenderness out of proportion to the clinical exam, see photo above - Discussed that scars take up to 12 months to mature from the date of surgery - Recommend SPF 30+ to scar daily to prevent purple color from UV exposure during scar maturation process - Discussed that erythema and raised appearance of scar will fade over the next 4-6 months - OK to start scar massage at 4-6 weeks post-op - Can consider silicone based products for scar healing starting at 6 weeks post-op  HISTORY OF SQUAMOUS CELL CARCINOMA OF THE SKIN - No evidence of recurrence today - No lymphadenopathy - Recommend regular full body skin exams - Recommend daily broad spectrum sunscreen SPF 30+ to sun-exposed areas, reapply every 2 hours as needed.  - Call if any new or changing lesions are noted between office visits   Return for next TBSE.  I, Darice Smock, CMA, am acting as scribe for RUFUS CHRISTELLA HOLY, MD.   Documentation: I have reviewed the above documentation for accuracy and completeness, and I agree with the above.  RUFUS CHRISTELLA HOLY, MD    "

## 2024-11-14 ENCOUNTER — Telehealth: Payer: Self-pay | Admitting: Cardiology

## 2024-11-14 NOTE — Telephone Encounter (Signed)
 Pt c/o medication issue:  1. Name of Medication: clopidogrel  (PLAVIX ) 75 MG tablet   REPATHA  SURECLICK 140 MG/ML SOAJ   2. How are you currently taking this medication (dosage and times per day)?    3. Are you having a reaction (difficulty breathing--STAT)? no  4. What is your medication issue? Prior shara is needed for insurance devoted. Please advise

## 2024-11-16 ENCOUNTER — Telehealth: Payer: Self-pay | Admitting: Pharmacy Technician

## 2024-11-16 ENCOUNTER — Other Ambulatory Visit (HOSPITAL_COMMUNITY): Payer: Self-pay

## 2024-11-16 NOTE — Telephone Encounter (Signed)
" ° °  Pharmacy Patient Advocate Encounter   Received notification from Pt Calls Messages that prior authorization for REPATHA  is required/requested.   Insurance verification completed.   The patient is insured through [employer].   Per test claim: PA required; PA submitted to above mentioned insurance via Latent Key/confirmation #/EOC [id number] Status is pending  "

## 2024-11-16 NOTE — Telephone Encounter (Signed)
 PA request has been Submitted. New Encounter has been or will be created for follow up. For additional info see Pharmacy Prior Auth telephone encounter from 11/16/24.

## 2024-11-16 NOTE — Telephone Encounter (Signed)
 Pharmacy Patient Advocate Encounter  Received notification from [employer] that Prior Authorization for repatha  has been APPROVED from 10/11/24 to 11/16/25   PA #/Case ID/Reference #: [id number]

## 2024-12-24 ENCOUNTER — Ambulatory Visit

## 2024-12-31 ENCOUNTER — Ambulatory Visit: Admitting: Podiatry

## 2025-01-02 ENCOUNTER — Encounter: Admitting: Dermatology

## 2025-01-03 ENCOUNTER — Ambulatory Visit: Admitting: Nurse Practitioner

## 2025-01-22 ENCOUNTER — Ambulatory Visit
